# Patient Record
Sex: Female | Born: 1945
Health system: Southern US, Community
[De-identification: ages and names within clinical notes are randomized; demographics above are authoritative.]

## PROBLEM LIST (undated history)

## (undated) DIAGNOSIS — IMO0001 Reserved for inherently not codable concepts without codable children: Secondary | ICD-10-CM

## (undated) DIAGNOSIS — I10 Essential (primary) hypertension: Secondary | ICD-10-CM

## (undated) DIAGNOSIS — E039 Hypothyroidism, unspecified: Secondary | ICD-10-CM

## (undated) DIAGNOSIS — K279 Peptic ulcer, site unspecified, unspecified as acute or chronic, without hemorrhage or perforation: Secondary | ICD-10-CM

## (undated) DIAGNOSIS — Z794 Long term (current) use of insulin: Secondary | ICD-10-CM

## (undated) DIAGNOSIS — G8929 Other chronic pain: Secondary | ICD-10-CM

## (undated) DIAGNOSIS — M549 Dorsalgia, unspecified: Secondary | ICD-10-CM

## (undated) DIAGNOSIS — I509 Heart failure, unspecified: Secondary | ICD-10-CM

## (undated) DIAGNOSIS — E669 Obesity, unspecified: Secondary | ICD-10-CM

## (undated) DIAGNOSIS — F0390 Unspecified dementia without behavioral disturbance: Secondary | ICD-10-CM

## (undated) DIAGNOSIS — E119 Type 2 diabetes mellitus without complications: Secondary | ICD-10-CM

## (undated) DIAGNOSIS — K7581 Nonalcoholic steatohepatitis (NASH): Secondary | ICD-10-CM

## (undated) DIAGNOSIS — F03A0 Unspecified dementia, mild, without behavioral disturbance, psychotic disturbance, mood disturbance, and anxiety: Secondary | ICD-10-CM

## (undated) HISTORY — DX: Peptic ulcer, site unspecified, unspecified as acute or chronic, without hemorrhage or perforation: K27.9

## (undated) HISTORY — PX: INSERT / REPLACE / REMOVE PACEMAKER: SUR710

## (undated) HISTORY — PX: TOTAL ABDOMINAL HYSTERECTOMY W/ BILATERAL SALPINGOOPHORECTOMY: SHX83

## (undated) HISTORY — DX: Hypothyroidism, unspecified: E03.9

## (undated) HISTORY — DX: Long term (current) use of insulin: Z79.4

## (undated) HISTORY — DX: Essential (primary) hypertension: I10

## (undated) HISTORY — DX: Dorsalgia, unspecified: M54.9

## (undated) HISTORY — DX: Nonalcoholic steatohepatitis (NASH): K75.81

## (undated) HISTORY — DX: Type 2 diabetes mellitus without complications: E11.9

## (undated) HISTORY — DX: Other chronic pain: G89.29

## (undated) HISTORY — PX: CHOLECYSTECTOMY: SHX55

## (undated) HISTORY — DX: Reserved for inherently not codable concepts without codable children: IMO0001

## (undated) HISTORY — DX: Obesity, unspecified: E66.9

---

## 1989-01-12 DIAGNOSIS — K279 Peptic ulcer, site unspecified, unspecified as acute or chronic, without hemorrhage or perforation: Secondary | ICD-10-CM

## 1989-01-12 HISTORY — DX: Peptic ulcer, site unspecified, unspecified as acute or chronic, without hemorrhage or perforation: K27.9

## 2004-03-23 ENCOUNTER — Ambulatory Visit: Payer: Self-pay

## 2004-05-09 ENCOUNTER — Emergency Department: Payer: Self-pay | Admitting: Unknown Physician Specialty

## 2004-06-19 ENCOUNTER — Ambulatory Visit: Payer: Self-pay | Admitting: Urology

## 2006-06-08 ENCOUNTER — Inpatient Hospital Stay: Payer: Self-pay | Admitting: Internal Medicine

## 2006-06-08 ENCOUNTER — Other Ambulatory Visit: Payer: Self-pay

## 2006-06-16 ENCOUNTER — Emergency Department: Payer: Self-pay | Admitting: Emergency Medicine

## 2006-06-16 ENCOUNTER — Other Ambulatory Visit: Payer: Self-pay

## 2006-06-24 ENCOUNTER — Ambulatory Visit: Payer: Self-pay | Admitting: Internal Medicine

## 2007-01-31 ENCOUNTER — Emergency Department: Payer: Self-pay | Admitting: Emergency Medicine

## 2007-02-10 ENCOUNTER — Other Ambulatory Visit: Payer: Self-pay

## 2007-02-11 ENCOUNTER — Inpatient Hospital Stay: Payer: Self-pay | Admitting: Internal Medicine

## 2007-09-12 ENCOUNTER — Ambulatory Visit: Payer: Self-pay | Admitting: Internal Medicine

## 2007-11-04 ENCOUNTER — Ambulatory Visit: Payer: Self-pay | Admitting: Gastroenterology

## 2007-11-25 ENCOUNTER — Emergency Department: Payer: Self-pay | Admitting: Emergency Medicine

## 2008-01-08 ENCOUNTER — Ambulatory Visit: Payer: Self-pay | Admitting: Anesthesiology

## 2008-01-13 ENCOUNTER — Ambulatory Visit: Payer: Self-pay | Admitting: Internal Medicine

## 2008-01-18 ENCOUNTER — Emergency Department: Payer: Self-pay | Admitting: Emergency Medicine

## 2008-02-09 ENCOUNTER — Emergency Department (HOSPITAL_COMMUNITY): Admission: EM | Admit: 2008-02-09 | Discharge: 2008-02-09 | Payer: Self-pay | Admitting: Emergency Medicine

## 2008-02-27 ENCOUNTER — Emergency Department: Payer: Self-pay | Admitting: Emergency Medicine

## 2008-03-15 ENCOUNTER — Emergency Department: Payer: Self-pay | Admitting: Emergency Medicine

## 2008-03-22 ENCOUNTER — Ambulatory Visit: Payer: Self-pay | Admitting: Anesthesiology

## 2009-01-31 ENCOUNTER — Ambulatory Visit: Payer: Self-pay | Admitting: Family

## 2009-02-11 ENCOUNTER — Ambulatory Visit: Payer: Self-pay | Admitting: Family

## 2009-09-13 ENCOUNTER — Emergency Department: Payer: Self-pay | Admitting: Emergency Medicine

## 2010-06-17 ENCOUNTER — Emergency Department: Payer: Self-pay | Admitting: Emergency Medicine

## 2011-01-22 ENCOUNTER — Ambulatory Visit: Payer: Self-pay | Admitting: Surgery

## 2011-02-12 LAB — URINE CULTURE: Colony Count: >=100000

## 2011-02-12 LAB — POCT I-STAT, CHEM 8
BUN: 12
Calcium, Ion: 1.18
Creatinine, Ser: 1
Hemoglobin: 13.6
Sodium: 128 — ABNORMAL LOW
TCO2: 23

## 2011-02-12 LAB — URINE MICROSCOPIC-ADD ON

## 2011-02-12 LAB — URINALYSIS, ROUTINE W REFLEX MICROSCOPIC
Bilirubin Urine: NEGATIVE
Hgb urine dipstick: NEGATIVE
Ketones, ur: 15 — AB
Nitrite: NEGATIVE
Specific Gravity, Urine: 1.029
Urobilinogen, UA: 0.2

## 2011-02-12 LAB — GLUCOSE, CAPILLARY
Glucose-Capillary: 245 — ABNORMAL HIGH
Glucose-Capillary: 323 — ABNORMAL HIGH

## 2011-02-24 ENCOUNTER — Emergency Department: Payer: Self-pay | Admitting: Emergency Medicine

## 2011-03-01 ENCOUNTER — Other Ambulatory Visit: Payer: Self-pay | Admitting: Internal Medicine

## 2011-03-15 ENCOUNTER — Ambulatory Visit: Payer: Self-pay | Admitting: Surgery

## 2011-03-21 ENCOUNTER — Emergency Department: Payer: Self-pay | Admitting: *Deleted

## 2011-03-29 ENCOUNTER — Telehealth: Payer: Self-pay | Admitting: Internal Medicine
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
## 2011-03-30 NOTE — Telephone Encounter (Signed)
Patient says that she will try the miralax and will call back if there is no improvement.

## 2011-03-30 NOTE — Telephone Encounter (Signed)
Is she taking any laxatives, such as miralax? I would first recommend miralax 17gm by mouth twice daily.

## 2011-03-31 LAB — HEMOGLOBIN: Blood: 14.6

## 2011-03-31 LAB — BASIC METABOLIC PANEL
Creatinine: 1.2 mg/dL — AB (ref <=1.1)
Glucose: 382 mg/dL
Potassium: 5.5 mmol/L — AB (ref 3.4–5.3)

## 2011-03-31 LAB — HEPATIC FUNCTION PANEL
ALT: 58 U/L — AB (ref 7–35)
Bilirubin, Total: 1.1 mg/dL

## 2011-03-31 LAB — CORTISOL: Blood: 32.6

## 2011-04-02 ENCOUNTER — Ambulatory Visit: Payer: Self-pay | Admitting: Internal Medicine

## 2011-04-03 LAB — BASIC METABOLIC PANEL
Creatinine: 1.2 mg/dL — AB (ref <=1.1)
Glucose: 135 mg/dL

## 2011-04-03 LAB — TSH: TSH: 79.17 u[IU]/mL — AB (ref <=5.90)

## 2011-04-03 LAB — T4: Blood: 0.32

## 2011-04-11 ENCOUNTER — Ambulatory Visit (INDEPENDENT_AMBULATORY_CARE_PROVIDER_SITE_OTHER): Payer: Medicare Other | Admitting: Internal Medicine

## 2011-04-11 ENCOUNTER — Encounter: Payer: Self-pay | Admitting: Internal Medicine

## 2011-04-11 VITALS — BP 142/82 | HR 106 | Temp 98.1°F | Wt 176.0 lb

## 2011-04-11 DIAGNOSIS — E039 Hypothyroidism, unspecified: Secondary | ICD-10-CM

## 2011-04-11 DIAGNOSIS — R5383 Other fatigue: Secondary | ICD-10-CM

## 2011-04-11 DIAGNOSIS — R109 Unspecified abdominal pain: Secondary | ICD-10-CM

## 2011-04-11 DIAGNOSIS — E119 Type 2 diabetes mellitus without complications: Secondary | ICD-10-CM | POA: Insufficient documentation

## 2011-04-11 DIAGNOSIS — G8929 Other chronic pain: Secondary | ICD-10-CM

## 2011-04-11 DIAGNOSIS — R1032 Left lower quadrant pain: Secondary | ICD-10-CM | POA: Insufficient documentation

## 2011-04-11 DIAGNOSIS — R5381 Other malaise: Secondary | ICD-10-CM

## 2011-04-11 LAB — POCT URINALYSIS DIPSTICK
Bilirubin, UA: NEGATIVE
Blood, UA: NEGATIVE
Nitrite, UA: NEGATIVE
Urobilinogen, UA: 1
pH, UA: 6.5

## 2011-04-11 NOTE — Progress Notes (Signed)
Subjective:    Patient ID: Andrea [name], female    DOB: 1946/04/06, 65 y.o.   MRN: [id number]  HPI Andrea [name] is a 65 year old female with a history of diabetes, hypothyroidism, and hypertension who had been lost to followup for almost one year. She was recently admitted at [facility] when she was noted to be lethargic and had severe constipation. During that admission her TSH was checked and found to be greater than 100. Her husband notes that she had not refilled her Synthroid in approximately one year. She was started back on her medications and laxatives were given to help with her severe constipation. She was discharged from the hospital last week. Her husband reports that since she has been home she has been extremely fatigued. She has not been acting like herself. Her gait has been unstable. He has been monitoring her 24 hours a day. He has been giving her her medicines and note she has been compliant with her medications. Over the last couple of days, she has complained of progressively worsening left lower quadrant abdominal pain. She has not had any nausea, vomiting, fever, chills. She has not had any diarrhea or blood in her stool. Her constipation resolved and she reports her stools have been normal. She has not been taking any medication for her abdominal pain.  In regards to her diabetes, patient's husband reports that her blood sugars have been running near 200. He denies any low blood sugars or any blood sugars greater than 300. He reports that he has been giving patient her insulin injections.  Outpatient Encounter Prescriptions as of 04/11/2011  Medication Sig Dispense Refill  . acetaminophen (TYLENOL) 325 MG tablet Take 650 mg by mouth 3 (three) times daily as needed.        Marland Kitchen esomeprazole (NEXIUM) 40 MG capsule Take 40 mg by mouth daily.        Marland Kitchen gabapentin (NEURONTIN) 600 MG tablet Take 600 mg by mouth 2 (two) times daily.        . insulin NPH (HUMULIN N,NOVOLIN N) 100 UNIT/ML injection Inject  into the skin. 40 units in am and 30 in afternoon       . levothyroxine (SYNTHROID, LEVOTHROID) 137 MCG tablet Take 137 mcg by mouth daily.        Marland Kitchen lisinopril (PRINIVIL,ZESTRIL) 40 MG tablet Take 40 mg by mouth daily.        . metoprolol (LOPRESSOR) 50 MG tablet Take 50 mg by mouth 2 (two) times daily.        . polyethylene glycol powder (GLYCOLAX/MIRALAX) powder Take 17 g by mouth 3 (three) times daily as needed. For constipation       . senna (SENOKOT) 8.6 MG tablet Take 1 tablet by mouth daily.          Review of Systems  Constitutional: Positive for activity change and fatigue. Negative for fever and unexpected weight change.  HENT: Negative for congestion and trouble swallowing.   Respiratory: Negative for cough and shortness of breath.   Cardiovascular: Negative for chest pain and leg swelling.  Gastrointestinal: Positive for abdominal pain, constipation and abdominal distention. Negative for nausea, vomiting, diarrhea, blood in stool and anal bleeding.  Genitourinary: Negative for dysuria, frequency and flank pain.  Musculoskeletal: Positive for myalgias.  Neurological: Positive for weakness.  Psychiatric/Behavioral: Positive for behavioral problems, confusion and decreased concentration. The patient is not nervous/anxious.    BP 142/82  Pulse 106  Temp(Src) 98.1 F (36.7 C) (Oral)  Wt 176  lb (79.833 kg)  SpO2 90%     Objective:   Physical Exam  Constitutional: She is oriented to person, place, and time. She appears well-developed and well-nourished. No distress.  HENT:  Head: Normocephalic and atraumatic.  Right Ear: External ear normal.  Left Ear: External ear normal.  Nose: Nose normal.  Mouth/Throat: Oropharynx is clear and moist. No oropharyngeal exudate.  Eyes: Conjunctivae are normal. Pupils are equal, round, and reactive to light. Right eye exhibits no discharge. Left eye exhibits no discharge. No scleral icterus.  Neck: Normal range of motion. Neck supple. No  tracheal deviation present. No thyromegaly present.  Cardiovascular: Normal rate, regular rhythm, normal heart sounds and intact distal pulses.  Exam reveals no gallop and no friction rub.   No murmur heard. Pulmonary/Chest: Effort normal and breath sounds normal. No respiratory distress. She has no wheezes. She has no rales. She exhibits no tenderness.  Abdominal: Soft. Bowel sounds are normal. She exhibits no distension. There is tenderness (left lower quadrant).  Musculoskeletal: Normal range of motion. She exhibits no edema and no tenderness.  Lymphadenopathy:    She has no cervical adenopathy.  Neurological: She is alert and oriented to person, place, and time. No cranial nerve deficit. She exhibits normal muscle tone. Coordination normal.  Skin: Skin is warm and dry. No rash noted. She is not diaphoretic. No erythema. No pallor.  Psychiatric: Thought content normal. Her affect is inappropriate. Her speech is delayed and slurred. She is slowed and withdrawn. Cognition and memory are impaired. She expresses inappropriate judgment. She exhibits a depressed mood.          Assessment & Plan:  1.  Lethargy - symptoms on presentation at [facility] are most consistent with myxedema coma. Thyroid medication was restarted. We will recheck TSH with labs today. Question if she Burgner have taken some medication inappropriately contributing to her lethargy. Will check a urine drug screen today. Note that a urine drug screen at [facility] was negative. CT of the head was also negative. She will followup next week.  2. Hypothyroidism -as above, her symptoms are most consistent with myxedema coma. She has been restarted on her thyroid medication during admission at [facility], however I question whether this is adequate. If her TSH continues to be markedly elevated on labs today and her symptoms of lethargy continue, I would favor admission for IV Synthroid and close monitoring.  3. Left lower quadrant abdominal pain -her  constipation was treated during her recent admission however her abdominal pain has gradually worsened. Exam is remarkable for tenderness LLQ. Question if she Suski have diverticulitis. Will get CT of the abdomen and pelvis.  4. DM - husband has been monitoring patient's blood sugars carefully. Blood sugar slightly elevated near 200, however given current altered mental status would prefer to allow some hyperglycemia. We'll continue current dose of NPH insulin. She will followup in one week.

## 2011-04-11 NOTE — Patient Instructions (Signed)
Labs today. Follow up in 1 week.

## 2011-04-12 ENCOUNTER — Ambulatory Visit: Payer: Self-pay | Admitting: Internal Medicine

## 2011-04-12 LAB — CBC WITH DIFFERENTIAL/PLATELET
Basophils Absolute: 0.2 10*3/uL — ABNORMAL HIGH (ref 0.0–0.1)
Eosinophils Absolute: 0.3 10*3/uL (ref 0.0–0.7)
HCT: 38.2 % (ref 36.0–46.0)
Hemoglobin: 13.2 g/dL (ref 12.0–15.0)
Lymphs Abs: 3.3 10*3/uL (ref 0.7–4.0)
MCHC: 34.7 g/dL (ref 30.0–36.0)
MCV: 89.8 fl (ref 78.0–100.0)
Monocytes Absolute: 0.4 10*3/uL (ref 0.1–1.0)
Neutro Abs: 5 10*3/uL (ref 1.4–7.7)
Platelets: 227 10*3/uL (ref 150.0–400.0)
RDW: 12.5 % (ref 11.5–14.6)

## 2011-04-12 LAB — COMPREHENSIVE METABOLIC PANEL
ALT: 26 U/L (ref 0–35)
AST: 23 U/L (ref 0–37)
Alkaline Phosphatase: 163 U/L — ABNORMAL HIGH (ref 39–117)
CO2: 28 mEq/L (ref 19–32)
Creatinine, Ser: 1 mg/dL (ref 0.4–1.2)
GFR: 61.17 mL/min (ref >60.00)
Sodium: 134 mEq/L — ABNORMAL LOW (ref 135–145)
Total Bilirubin: 0.5 mg/dL (ref 0.3–1.2)

## 2011-04-13 ENCOUNTER — Telehealth: Payer: Self-pay | Admitting: Internal Medicine

## 2011-04-13 LAB — DRUG SCREEN, URINE
Barbiturate Quant, Ur: NEGATIVE
Cocaine Metabolites: NEGATIVE
Creatinine,U: 61.7 mg/dL
Opiates: NEGATIVE

## 2011-04-13 MED ORDER — METRONIDAZOLE 500 MG PO TABS: 500.0000 mg | ORAL_TABLET | Freq: Two times a day (BID) | ORAL | Status: AC

## 2011-04-13 MED ORDER — CIPROFLOXACIN HCL 500 MG PO TABS: 500.0000 mg | ORAL_TABLET | Freq: Two times a day (BID) | ORAL | Status: AC

## 2011-04-13 NOTE — Telephone Encounter (Signed)
Rx's sent in, Patient informed

## 2011-04-13 NOTE — Telephone Encounter (Signed)
CT scan showed diverticulitis. This is what is causing her abdominal pain.  Would recommend starting Cipro 500mg  po bid and Flagyl 500mg  po bid both for 14 days. She has follow up next week.

## 2011-04-16 ENCOUNTER — Telehealth: Payer: Self-pay | Admitting: Internal Medicine

## 2011-04-16 NOTE — Telephone Encounter (Signed)
[phone] Pt spouse called with blood sugar readings Friday    9:30am    494 12            465 4 pm        204 Saturday 12       136 6pm    400 10pm  460 Sunday 9am       360 3pm       27 8 10pm    378 Monday 11am    466  Pt was taking humulin 70/30 [facility] said not making humulin any longer they gave her  novloin 70/30 Pt upped her insuln to 50 units Saturday night Hospital took her off medformin  Spouse said pt is still not having bowel movement on her own. Last bowel movement Saturday.  Her memory is still not good.

## 2011-04-16 NOTE — Telephone Encounter (Signed)
Please bring her in this week

## 2011-04-16 NOTE — Telephone Encounter (Signed)
Spoke w/pt - confirmed that she will keep her f/u apt this thursday

## 2011-04-19 ENCOUNTER — Ambulatory Visit (INDEPENDENT_AMBULATORY_CARE_PROVIDER_SITE_OTHER): Payer: Medicare Other | Admitting: Internal Medicine

## 2011-04-19 ENCOUNTER — Encounter: Payer: Self-pay | Admitting: Internal Medicine

## 2011-04-19 VITALS — BP 136/80 | HR 58 | Temp 97.8°F | Wt 177.0 lb

## 2011-04-19 DIAGNOSIS — E039 Hypothyroidism, unspecified: Secondary | ICD-10-CM

## 2011-04-19 DIAGNOSIS — E119 Type 2 diabetes mellitus without complications: Secondary | ICD-10-CM

## 2011-04-19 DIAGNOSIS — K5732 Diverticulitis of large intestine without perforation or abscess without bleeding: Secondary | ICD-10-CM

## 2011-04-19 DIAGNOSIS — K5792 Diverticulitis of intestine, part unspecified, without perforation or abscess without bleeding: Secondary | ICD-10-CM

## 2011-04-19 MED ORDER — LEVOTHYROXINE SODIUM 137 MCG PO TABS: 137.0000 ug | ORAL_TABLET | Freq: Every day | ORAL | Status: DC

## 2011-04-19 MED ORDER — HYDROCODONE-ACETAMINOPHEN 5-325 MG PO TABS: 1.0000 | ORAL_TABLET | Freq: Four times a day (QID) | ORAL | Status: AC | PRN

## 2011-04-19 NOTE — Patient Instructions (Signed)
Diverticulitis A diverticulum is a small pouch or sac on the colon. Diverticulosis is the presence of these diverticula on the colon. Diverticulitis is the irritation (inflammation) or infection of diverticula. CAUSES  The colon and its diverticula contain bacteria. If food particles block the tiny opening to a diverticulum, the bacteria inside can grow and cause an increase in pressure. This leads to infection and inflammation and is called diverticulitis. SYMPTOMS   Abdominal pain and tenderness. Usually, the pain is located on the left side of your abdomen. However, it could be located elsewhere.   Fever.   Bloating.   Feeling sick to your stomach (nausea).   Throwing up (vomiting).   Abnormal stools.  DIAGNOSIS  Your caregiver will take a history and perform a physical exam. Since many things can cause abdominal pain, other tests Cupit be necessary. Tests Jons include:  Blood tests.   Urine tests.   X-ray of the abdomen.   CT scan of the abdomen.  Sometimes, surgery is needed to determine if diverticulitis or other conditions are causing your symptoms. TREATMENT  Most of the time, you can be treated without surgery. Treatment includes:  Resting the bowels by only having liquids for a few days. As you improve, you will need to eat a low-fiber diet.   Intravenous (IV) fluids if you are losing body fluids (dehydrated).   Antibiotic medicines that treat infections Wester be given.   Pain and nausea medicine, if needed.   Surgery if the inflamed diverticulum has burst.  HOME CARE INSTRUCTIONS   Try a clear liquid diet (broth, tea, or water for as long as directed by your caregiver). You Mesch then gradually begin a low-fiber diet as tolerated. A low-fiber diet is a diet with less than 10 grams of fiber. Choose the foods below to reduce fiber in the diet:   White breads, cereals, rice, and pasta.   Cooked fruits and vegetables or soft fresh fruits and vegetables without the skin.     Ground or well-cooked tender beef, ham, veal, lamb, pork, or poultry.   Eggs and seafood.   After your diverticulitis symptoms have improved, your caregiver Gayman put you on a high-fiber diet. A high-fiber diet includes 14 grams of fiber for every 1000 calories consumed. For a standard 2000 calorie diet, you would need 28 grams of fiber. Follow these diet guidelines to help you increase the fiber in your diet. It is important to slowly increase the amount fiber in your diet to avoid gas, constipation, and bloating.   Choose whole-grain breads, cereals, pasta, and brown rice.   Choose fresh fruits and vegetables with the skin on. Do not overcook vegetables because the more vegetables are cooked, the more fiber is lost.   Choose more nuts, seeds, legumes, dried peas, beans, and lentils.   Look for food products that have greater than 3 grams of fiber per serving on the Nutrition Facts label.   Take all medicine as directed by your caregiver.   If your caregiver has given you a follow-up appointment, it is very important that you go. Not going could result in lasting (chronic) or permanent injury, pain, and disability. If there is any problem keeping the appointment, call to reschedule.  SEEK MEDICAL CARE IF:   Your pain does not improve.   You have a hard time advancing your diet beyond clear liquids.   Your bowel movements do not return to normal.  SEEK IMMEDIATE MEDICAL CARE IF:   Your pain becomes   worse.   You have an oral temperature above 102 F (38.9 C), not controlled by medicine.   You have repeated vomiting.   You have bloody or black, tarry stools.   Symptoms that brought you to your caregiver become worse or are not getting better.  MAKE SURE YOU:   Understand these instructions.   Will watch your condition.   Will get help right away if you are not doing well or get worse.  Document Released: 02/07/2005 Document Revised: 01/10/2011 Document Reviewed:  06/05/2010 [facility] Patient Information 2012 [place].

## 2011-04-19 NOTE — Progress Notes (Signed)
Subjective:    Patient ID: Andrea [name], female    DOB: 1946/02/27, 66 y.o.   MRN: [id number]  HPI 65 year old female with a recent history of myxedema coma, diabetes, and diverticulitis presents for followup. She was recently hospitalized at [facility] with myxedema coma. She was started back on her thyroid medication after approximately one year without her medication. At her last visit, her TSH level was noted to have dropped to 37. This was marked improvement compared to previous levels greater than 100. Her husband reports that over the last week her mental status has improved markedly. He notes that she is more interactive in her weakness has improved. She does not recall events of the last few weeks.  In regards to her left lower quadrant pain, she underwent a CT of the abdomen after her last visit which showed diverticulitis. She was treated with Cipro and Flagyl. She still has one week left of antibiotic treatment. She has noted some improvement in her pain. She has been taking Tylenol for pain. She would like to use something stronger for severe episodes. She denies any diarrhea or blood in her stool. She denies any nausea or vomiting.  In regards to her diabetes, her husband reports that her blood sugars have been well-controlled, typically between 160 and 180. She increased her insulin to 50 units of NPH twice daily. She denies any low blood sugars.  Outpatient Encounter Prescriptions as of 04/19/2011  Medication Sig Dispense Refill  . acetaminophen (TYLENOL) 325 MG tablet Take 650 mg by mouth 3 (three) times daily as needed.        . ciprofloxacin (CIPRO) 500 MG tablet Take 1 tablet (500 mg total) by mouth 2 (two) times daily.  28 tablet  0  . esomeprazole (NEXIUM) 40 MG capsule Take 40 mg by mouth daily.        Marland Kitchen gabapentin (NEURONTIN) 600 MG tablet Take 600 mg by mouth 2 (two) times daily.        . insulin NPH (HUMULIN N,NOVOLIN N) 100 UNIT/ML injection Inject into the skin. 50  units in am and 50 in afternoon      . levothyroxine (SYNTHROID, LEVOTHROID) 137 MCG tablet Take 1 tablet (137 mcg total) by mouth daily.  30 tablet  6  . lisinopril (PRINIVIL,ZESTRIL) 40 MG tablet Take 40 mg by mouth daily.        . metoprolol (LOPRESSOR) 50 MG tablet Take 50 mg by mouth 2 (two) times daily.        . metroNIDAZOLE (FLAGYL) 500 MG tablet Take 1 tablet (500 mg total) by mouth 2 (two) times daily.  28 tablet  0  . polyethylene glycol powder (GLYCOLAX/MIRALAX) powder Take 17 g by mouth 3 (three) times daily as needed. For constipation       . senna (SENOKOT) 8.6 MG tablet Take 1 tablet by mouth daily.        Marland Kitchen HYDROcodone-acetaminophen (NORCO) 5-325 MG per tablet Take 1 tablet by mouth every 6 (six) hours as needed for pain.  60 tablet  0    Review of Systems  Constitutional: Positive for fatigue. Negative for fever, chills, appetite change and unexpected weight change.  HENT: Negative for ear pain, congestion, sore throat, trouble swallowing, neck pain, voice change and sinus pressure.   Eyes: Negative for visual disturbance.  Respiratory: Negative for cough, shortness of breath, wheezing and stridor.   Cardiovascular: Negative for chest pain, palpitations and leg swelling.  Gastrointestinal: Positive for abdominal pain.  Negative for nausea, vomiting, diarrhea, constipation, blood in stool, abdominal distention and anal bleeding.  Genitourinary: Negative for dysuria and flank pain.  Musculoskeletal: Positive for back pain. Negative for myalgias, arthralgias and gait problem.  Skin: Negative for color change and rash.  Neurological: Positive for weakness. Negative for dizziness and headaches.  Hematological: Negative for adenopathy. Does not bruise/bleed easily.  Psychiatric/Behavioral: Positive for behavioral problems and decreased concentration. Negative for suicidal ideas, sleep disturbance and dysphoric mood. The patient is not nervous/anxious.    BP 136/80  Pulse 58   Temp(Src) 97.8 F (36.6 C) (Oral)  Wt 177 lb (80.287 kg)  SpO2 98%     Objective:   Physical Exam  Constitutional: She is oriented to person, place, and time. She appears well-developed and well-nourished. No distress.  HENT:  Head: Normocephalic and atraumatic.  Right Ear: External ear normal.  Left Ear: External ear normal.  Nose: Nose normal.  Mouth/Throat: Oropharynx is clear and moist. No oropharyngeal exudate.  Eyes: Conjunctivae are normal. Pupils are equal, round, and reactive to light. Right eye exhibits no discharge. Left eye exhibits no discharge. No scleral icterus.  Neck: Normal range of motion. Neck supple. No tracheal deviation present. No thyromegaly present.  Cardiovascular: Normal rate, regular rhythm, normal heart sounds and intact distal pulses.  Exam reveals no gallop and no friction rub.   No murmur heard. Pulmonary/Chest: Effort normal and breath sounds normal. No respiratory distress. She has no wheezes. She has no rales. She exhibits no tenderness.  Abdominal: Soft. Bowel sounds are normal. She exhibits no distension and no mass. There is tenderness (minimal left lower quadrant). There is no rebound and no guarding.  Musculoskeletal: Normal range of motion. She exhibits no edema and no tenderness.  Lymphadenopathy:    She has no cervical adenopathy.  Neurological: She is alert and oriented to person, place, and time. No cranial nerve deficit. She exhibits normal muscle tone. Coordination normal.  Skin: Skin is warm and dry. No rash noted. She is not diaphoretic. No erythema. No pallor.  Psychiatric: She has a normal mood and affect. Her behavior is normal. Judgment and thought content normal.          Assessment & Plan:  1. Hypothyroidism -symptomatically, marked improvement. We'll plan to repeat TSH after next visit in one month. Will continue Synthroid at current dose.  2. Diverticulitis - continues to have some intermittent pain. Will add hydrocodone  for episodes of severe pain. Will continue Cipro and Flagyl. We'll set up GI evaluation for colonoscopy.  3. Diabetes mellitus - blood sugars are improved. Will continue current doses of Novolin 70/30. Patient will followup in one month.

## 2011-04-24 ENCOUNTER — Telehealth: Payer: Self-pay | Admitting: *Deleted

## 2011-04-24 NOTE — Telephone Encounter (Signed)
OK, will write out specific directions at her visit.

## 2011-04-24 NOTE — Telephone Encounter (Signed)
Home health RN called and left VM - patient left hospital w/sliding scale orders for her insulin. RN wants directions from Dr walker for what she would like patient to follow. Pt's fasting cbg this am was 434 and pt reports readings have been elevated. [facility] is having a dietician to see pt soon. I called RN, Arbie Cookey and advised her that patient has f/u OV with Dr Gilford Rile this Thursday, medications will be discussed at that time.

## 2011-04-26 ENCOUNTER — Ambulatory Visit: Payer: Self-pay | Admitting: Gastroenterology

## 2011-04-27 ENCOUNTER — Telehealth: Payer: Self-pay | Admitting: Internal Medicine

## 2011-04-27 NOTE — Telephone Encounter (Signed)
Spoke w/pt's husband - pt is in significant abd discomfort, c/o weakness and she also fell this am backwards hitting the back of her head. Dr Gilford Rile informed and agreed that patient needed to go to the ER for eval. Husband informed and agreed.

## 2011-04-27 NOTE — Telephone Encounter (Signed)
Patient has fallen and hit her head today while her husband was on the phone. Her husband wanted you to know.

## 2011-04-27 NOTE — Telephone Encounter (Signed)
Can you please get more information about this

## 2011-05-01 ENCOUNTER — Encounter: Payer: Self-pay | Admitting: Internal Medicine

## 2011-05-01 ENCOUNTER — Telehealth: Payer: Self-pay | Admitting: *Deleted

## 2011-05-01 NOTE — Telephone Encounter (Signed)
I spoke w/Resident, Dr Rayburn Go at the ER at [facility]. Pt was seen by home health RN today who convinced patient to go to the ER. Main complaint was constipation, elevated BP and elevated cbgs. MD said BP and blood sugar are in normal range. They are giving her a bowel prep to help ease her abd pain due to constipation. Pt reported reported that she had not been taking her insulin or meds correctly due to her husband being in the hospital. Pt also expressed concerns over losing her home health care. I advised the MD that we would see her for hospital f/u as they directed and would help to continue her home health services if needed.

## 2011-05-02 ENCOUNTER — Telehealth: Payer: Self-pay | Admitting: *Deleted

## 2011-05-02 NOTE — Telephone Encounter (Signed)
Home health nurse called -   1.pt's husband is in the hospital but patient has family in town. RN was told by pt that she has colonoscopy soon, possibly dec 31st but does not have any details of apt. RN would like a call from our office w/info. I left VM w/RN for her to call GI MD to confirm colon apt.   2. RN is requesting an order for standard wheelchair w/Dx & length of need (1 year). FAX to [facility] [phone]. Order completed, to be faxed tomorrow.

## 2011-05-02 NOTE — Telephone Encounter (Signed)
OK on wheelchair

## 2011-05-14 ENCOUNTER — Ambulatory Visit: Payer: Self-pay | Admitting: Gastroenterology

## 2011-05-16 ENCOUNTER — Other Ambulatory Visit: Payer: Self-pay | Admitting: Internal Medicine

## 2011-05-16 ENCOUNTER — Ambulatory Visit: Payer: Self-pay | Admitting: Gastroenterology

## 2011-05-16 NOTE — Telephone Encounter (Signed)
Patient is needing a refill on her Hydrocodone 5-325 mg. She is pacing the floor all night in pain.

## 2011-05-16 NOTE — Telephone Encounter (Signed)
Patient needs refill, but med is not on her med list. Left message asking patient to return my call to let me know directions.

## 2011-05-17 LAB — PATHOLOGY REPORT

## 2011-05-18 NOTE — Telephone Encounter (Signed)
I spoke w/pt- She had bowel prep Tuesday and colonoscopy wed. She was told that a couple polyps were removed and bowel prep was successful. Pt says she thinks she is constipated and has tried miralax, citracel and sennakot w/no BM. She was seen in the ER on 12/18 for severe constipation and abd pain which has been chronic problem lately. I advised her that if she had colonoscopy wed it would be unusual to become constipated this quickly. Also advised her that hydrocodone would cause constipation and she should limit use. She will go to the ER if pain does not get better or gets worse, pt agreed and has f/u w/Dr Gilford Rile this Wednesday.

## 2011-05-18 NOTE — Telephone Encounter (Signed)
This patient was getting vicodin in the past for diverticulitis episodes.  I will not refill without more info sinc eshe [place] need urgent evaluation.  thanks

## 2011-05-19 ENCOUNTER — Emergency Department (HOSPITAL_COMMUNITY)
Admission: EM | Admit: 2011-05-19 | Discharge: 2011-05-19 | Disposition: A | Discharge status: Home / Self Care | Payer: Medicare Other | Attending: Emergency Medicine | Admitting: Emergency Medicine

## 2011-05-19 ENCOUNTER — Encounter (HOSPITAL_COMMUNITY): Payer: Self-pay | Admitting: *Deleted

## 2011-05-19 ENCOUNTER — Emergency Department (HOSPITAL_COMMUNITY): Payer: Medicare Other

## 2011-05-19 ENCOUNTER — Other Ambulatory Visit: Payer: Self-pay

## 2011-05-19 DIAGNOSIS — I10 Essential (primary) hypertension: Secondary | ICD-10-CM | POA: Insufficient documentation

## 2011-05-19 DIAGNOSIS — R1032 Left lower quadrant pain: Secondary | ICD-10-CM

## 2011-05-19 DIAGNOSIS — E119 Type 2 diabetes mellitus without complications: Secondary | ICD-10-CM | POA: Insufficient documentation

## 2011-05-19 DIAGNOSIS — R110 Nausea: Secondary | ICD-10-CM | POA: Insufficient documentation

## 2011-05-19 DIAGNOSIS — R109 Unspecified abdominal pain: Secondary | ICD-10-CM | POA: Insufficient documentation

## 2011-05-19 DIAGNOSIS — Z794 Long term (current) use of insulin: Secondary | ICD-10-CM | POA: Insufficient documentation

## 2011-05-19 DIAGNOSIS — E669 Obesity, unspecified: Secondary | ICD-10-CM | POA: Insufficient documentation

## 2011-05-19 DIAGNOSIS — K279 Peptic ulcer, site unspecified, unspecified as acute or chronic, without hemorrhage or perforation: Secondary | ICD-10-CM | POA: Insufficient documentation

## 2011-05-19 LAB — CBC
MCV: 82.9 fL (ref 78.0–100.0)
Platelets: 176 10*3/uL (ref 150–400)
RDW: 12.3 % (ref 11.5–15.5)
WBC: 6.2 10*3/uL (ref 4.0–10.5)

## 2011-05-19 LAB — COMPREHENSIVE METABOLIC PANEL
AST: 18 U/L (ref 0–37)
Albumin: 3.7 g/dL (ref 3.5–5.2)
Calcium: 9.8 mg/dL (ref 8.4–10.5)
Chloride: 97 mEq/L (ref 96–112)
Creatinine, Ser: 0.64 mg/dL (ref 0.50–1.10)
Total Bilirubin: 0.3 mg/dL (ref 0.3–1.2)

## 2011-05-19 LAB — URINALYSIS, ROUTINE W REFLEX MICROSCOPIC
Glucose, UA: 250 mg/dL — AB
Hgb urine dipstick: NEGATIVE
Specific Gravity, Urine: 1.008 (ref 1.005–1.030)
pH: 7.5 (ref 5.0–8.0)

## 2011-05-19 LAB — LIPASE, BLOOD: Lipase: 36 U/L (ref 11–59)

## 2011-05-19 LAB — DIFFERENTIAL
Eosinophils Absolute: 0.1 10*3/uL (ref 0.0–0.7)
Eosinophils Relative: 1 % (ref 0–5)
Lymphs Abs: 1.5 10*3/uL (ref 0.7–4.0)

## 2011-05-19 MED ORDER — ONDANSETRON HCL 4 MG/2ML IJ SOLN: 4.0000 mg | Freq: Once | INTRAMUSCULAR | Status: DC

## 2011-05-19 MED ORDER — SODIUM CHLORIDE 0.9 % IV BOLUS (SEPSIS)
1000.0000 mL | Freq: Once | INTRAVENOUS | Status: AC
  Administered 2011-05-19: 1000 mL via INTRAVENOUS

## 2011-05-19 MED ORDER — OXYCODONE-ACETAMINOPHEN 5-325 MG PO TABS: 1.0000 | ORAL_TABLET | ORAL | Status: AC | PRN

## 2011-05-19 MED ORDER — HYDROMORPHONE HCL PF 1 MG/ML IJ SOLN
1.0000 mg | Freq: Once | INTRAMUSCULAR | Status: AC
  Administered 2011-05-19: 1 mg via INTRAVENOUS

## 2011-05-19 MED ORDER — HYDROMORPHONE HCL PF 1 MG/ML IJ SOLN
INTRAMUSCULAR | Status: AC
  Filled 2011-05-19: qty 1

## 2011-05-19 MED ORDER — IOHEXOL 300 MG/ML  SOLN
20.0000 mL | Freq: Once | INTRAMUSCULAR | Status: AC | PRN
  Administered 2011-05-19: 20 mL via ORAL

## 2011-05-19 MED ORDER — IOHEXOL 300 MG/ML  SOLN
100.0000 mL | Freq: Once | INTRAMUSCULAR | Status: AC | PRN
  Administered 2011-05-19: 100 mL via INTRAVENOUS

## 2011-05-19 MED ORDER — HYDROMORPHONE HCL PF 1 MG/ML IJ SOLN
1.0000 mg | Freq: Once | INTRAMUSCULAR | Status: AC
  Administered 2011-05-19: 1 mg via INTRAVENOUS
  Filled 2011-05-19: qty 1

## 2011-05-19 MED ORDER — HYDROMORPHONE HCL PF 1 MG/ML IJ SOLN
1.0000 mg | INTRAMUSCULAR | Status: AC
  Administered 2011-05-19: 1 mg via INTRAVENOUS
  Filled 2011-05-19: qty 1

## 2011-05-19 MED ORDER — HYDROMORPHONE HCL PF 1 MG/ML IJ SOLN: 1.0000 mg | Freq: Once | INTRAMUSCULAR | Status: DC

## 2011-05-19 MED ORDER — ONDANSETRON HCL 4 MG/2ML IJ SOLN
4.0000 mg | Freq: Once | INTRAMUSCULAR | Status: AC
  Administered 2011-05-19: 4 mg via INTRAVENOUS
  Filled 2011-05-19: qty 2

## 2011-05-19 NOTE — ED Notes (Signed)
Reports having LLQ pain for extended amount of time, having nausea, denies vomiting or diarrhea, last bm was 2 days ago.

## 2011-05-19 NOTE — ED Notes (Signed)
Pt finished drinking oral CT contrast.

## 2011-05-19 NOTE — ED Provider Notes (Signed)
History     CSN: [id number]  Arrival date & time 05/19/11  [id number]   First MD Initiated Contact with Patient 05/19/11 [phone]      Chief Complaint  Patient presents with  . Abdominal Pain    (Consider location/radiation/quality/duration/timing/severity/associated sxs/prior treatment) HPI  Patient complaining of left-sided abdominal pain for 6-8 weeks. She states pain has been constant in nature except yesterday when it was gone for sure. Time. She's been seen by her primary care doctor, Dr. Gilford Rile, in [place]. She states that she was referred to a gastroenterologist but does not know the name of the gastroenterologist. She states that she did have a CT scan sometime during this period but does not know exactly when it was. She states she was told at that time that she had diverticulitis and was put on antibiotics. She was called by the gastroenterologist and had a colonoscopy done on Wednesday. She states that she was scheduled to have it on Monday but had on Wednesday because she was told that she still had a lot of stool in her GI tract. She states at that time she was told she had diverticulosis and had multiple small polyps. She is continued to have pain since that time. She has nausea but no vomiting. She has not any fever or chills. She states she has lost 20-30 pounds subjectively. She denies any urinary tract infection symptoms. She has had a hysterectomy. She denies any chest pain cough or shortness of breath.  Past Medical History  Diagnosis Date  . HTN (hypertension)   . IDDM (insulin dependent diabetes mellitus)   . Chronic back pain     Followed by pain clinic in [place]  . NASH (nonalcoholic steatohepatitis)     has had liver biopsy in past  . Obesity   . Hypothyroidism   . Endometriosis   . PUD (peptic ulcer disease) 29's    Past Surgical History  Procedure Date  . Total abdominal hysterectomy w/ bilateral salpingoophorectomy   . Cholecystectomy     Family  History  Problem Relation Age of Onset  . COPD Sister   . Cancer Brother     History  Substance Use Topics  . Smoking status: Former Research scientist (life sciences)  . Smokeless tobacco: Not on file   Comment: Quit 1985  . Alcohol Use: No    OB History    Grav Para Term Preterm Abortions TAB SAB Ect Mult Living                  Review of Systems  All other systems reviewed and are negative.    Allergies  Watermelon concentrate; Penicillins; and Sulfa antibiotics  Home Medications   Current Outpatient Rx  Name Route Sig Dispense Refill  . VITAMIN D HIGH POTENCY PO Oral Take 1 capsule by mouth daily.      Marland Kitchen DICYCLOMINE HCL 10 MG PO CAPS Oral Take 10 mg by mouth 4 (four) times daily as needed. As needed for abdominal pain     . GABAPENTIN 600 MG PO TABS Oral Take 600 mg by mouth 2 (two) times daily.     . INSULIN ASPART 100 UNIT/ML [place] SOLN Subcutaneous Inject 3-15 Units into the skin 3 (three) times daily before meals. Sliding scale     . INSULIN ISOPHANE HUMAN 100 UNIT/ML  SUSP Subcutaneous Inject into the skin. 50 units in am and 50 in afternoon    . LEVOTHYROXINE SODIUM 137 MCG PO TABS Oral Take 1 tablet (137 mcg  total) by mouth daily. 30 tablet 6  . METOPROLOL TARTRATE 50 MG PO TABS Oral Take 50 mg by mouth 2 (two) times daily.      Marland Kitchen OMEPRAZOLE 40 MG PO CPDR Oral Take 40 mg by mouth daily.      . OXYCODONE HCL 5 MG PO TABS Oral Take 5-10 mg by mouth every 6 (six) hours as needed. pain     . PAROXETINE HCL 20 MG PO TABS Oral Take 20 mg by mouth every morning.      Marland Kitchen POLYETHYLENE GLYCOL 3350 PO POWD Oral Take 17 g by mouth 3 (three) times daily as needed. For constipation     . SENNOSIDES 8.6 MG PO TABS Oral Take 1 tablet by mouth daily.      . ACETAMINOPHEN 325 MG PO TABS Oral Take 650 mg by mouth 3 (three) times daily as needed.        BP 193/101  Pulse 87  Temp(Src) 98.6 F (37 C) (Oral)  Resp 16  SpO2 100%  Physical Exam  Nursing note and vitals reviewed. Constitutional: She is  oriented to person, place, and time. She appears well-developed.       Morbidly obese  HENT:  Head: Normocephalic and atraumatic.  Right Ear: External ear normal.  Left Ear: External ear normal.  Nose: Nose normal.  Mouth/Throat: Oropharynx is clear and moist.  Eyes: Conjunctivae and EOM are normal. Pupils are equal, round, and reactive to light.  Neck: Normal range of motion. Neck supple.  Cardiovascular: Normal rate, regular rhythm and normal heart sounds.   Abdominal: Soft. Bowel sounds are normal.       Left middle abdominal and upper quadrant pain noted with deep palpation.  Musculoskeletal: Normal range of motion.  Neurological: She is alert and oriented to person, place, and time. She has normal reflexes.  Skin: Skin is warm.  Psychiatric: She has a normal mood and affect.    ED Course  Procedures (including critical care time)   Labs Reviewed  CBC  DIFFERENTIAL  COMPREHENSIVE METABOLIC PANEL  LIPASE, BLOOD  URINALYSIS, ROUTINE W REFLEX MICROSCOPIC   No results found.   No diagnosis found.    MDM  Patient had initial pain relief after 1 mg of Dilaudid however pain returned and patient received a additional milligram to light without relief. She was initially hypertensive but currently her systolic blood pressure has decreased to 1:30. Results for orders placed during the hospital encounter of 05/19/11  DIFFERENTIAL      Component Value Range   Neutrophils Relative 71  43 - 77 (%)   Neutro Abs 4.7  1.7 - 7.7 (K/uL)   Lymphocytes Relative 23  12 - 46 (%)   Lymphs Abs 1.5  0.7 - 4.0 (K/uL)   Monocytes Relative 5  3 - 12 (%)   Monocytes Absolute 0.3  0.1 - 1.0 (K/uL)   Eosinophils Relative 1  0 - 5 (%)   Eosinophils Absolute 0.1  0.0 - 0.7 (K/uL)   Basophils Relative 1  0 - 1 (%)   Basophils Absolute 0.0  0.0 - 0.1 (K/uL)  CBC      Component Value Range   WBC 6.2  4.0 - 10.5 (K/uL)   RBC 5.08  3.87 - 5.11 (MIL/uL)   Hemoglobin 15.3 (*) 12.0 - 15.0 (g/dL)    HCT 42.1  36.0 - 46.0 (%)   MCV 82.9  78.0 - 100.0 (fL)   MCH 30.1  26.0 - 34.0 (pg)  MCHC 36.3 (*) 30.0 - 36.0 (g/dL)   RDW 12.3  11.5 - 15.5 (%)   Platelets 176  150 - 400 (K/uL)  COMPREHENSIVE METABOLIC PANEL      Component Value Range   Sodium 134 (*) 135 - 145 (mEq/L)   Potassium 3.9  3.5 - 5.1 (mEq/L)   Chloride 97  96 - 112 (mEq/L)   CO2 24  19 - 32 (mEq/L)   Glucose, Bld 287 (*) 70 - 99 (mg/dL)   BUN 10  6 - 23 (mg/dL)   Creatinine, Ser 0.64  0.50 - 1.10 (mg/dL)   Calcium 9.8  8.4 - 10.5 (mg/dL)   Total Protein 7.5  6.0 - 8.3 (g/dL)   Albumin 3.7  3.5 - 5.2 (g/dL)   AST 18  0 - 37 (U/L)   ALT 18  0 - 35 (U/L)   Alkaline Phosphatase 110  39 - 117 (U/L)   Total Bilirubin 0.3  0.3 - 1.2 (mg/dL)   GFR calc non Af Amer >90  >90 (mL/min)   GFR calc Af Amer >90  >90 (mL/min)  URINALYSIS, ROUTINE W REFLEX MICROSCOPIC      Component Value Range   Color, Urine YELLOW  YELLOW    APPearance HAZY (*) CLEAR    Specific Gravity, Urine 1.008  1.005 - 1.030    pH 7.5  5.0 - 8.0    Glucose, UA 250 (*) NEGATIVE (mg/dL)   Hgb urine dipstick NEGATIVE  NEGATIVE    Bilirubin Urine NEGATIVE  NEGATIVE    Ketones, ur NEGATIVE  NEGATIVE (mg/dL)   Protein, ur NEGATIVE  NEGATIVE (mg/dL)   Urobilinogen, UA 0.2  0.0 - 1.0 (mg/dL)   Nitrite NEGATIVE  NEGATIVE    Leukocytes, UA NEGATIVE  NEGATIVE   LIPASE, BLOOD      Component Value Range   Lipase 36  11 - 59 (U/L)    The patient is now pain-free and her abdomen is nontender. Her blood pressure has come down from 123456 2 systolic blood pressure of 120. She has a CT that does not show any acute abnormalities, comprehensive metabolic panel is normal with the exception of hyperglycemia, CBC is normal, and urine is clear. Lipase is checked and is normal. Patient's pain is currently controlled.  Discussed with patient her recent workup with gastroenterologist and primary care Dr. She does have the same pain for the past 2 months. She is receiving a  thorough outpatient evaluation. She has normal labs and vital signs at the exception of an elevated blood pressure were normal. This has now normalized after pain medicine. She will be given a prescription for 15 Percocet and is instructed to followup with her primary care Dr.      Shaune Pollack, MD 05/20/11 [id number]

## 2011-05-20 NOTE — Telephone Encounter (Signed)
I agree, I will defer the vicodin refill to Dr. Gilford Rile given the history Sarah obtained.

## 2011-05-23 ENCOUNTER — Ambulatory Visit: Payer: Medicare Other | Admitting: Internal Medicine

## 2011-05-24 ENCOUNTER — Telehealth: Payer: Self-pay | Admitting: *Deleted

## 2011-05-24 MED ORDER — INSULIN NPH ISOPHANE & REGULAR (70-30) 100 UNIT/ML ~~LOC~~ SUSP: SUBCUTANEOUS | Status: DC

## 2011-05-24 NOTE — Telephone Encounter (Signed)
I spoke w/pt's son and home health RN. Pt is on novolin 70/30 40 AM and 30 PM. Needs RF w/full mth supply sent to pharm. OK? This is not on med list but RN confirmed that pt was d/c'd from hospital on med.

## 2011-05-24 NOTE — Telephone Encounter (Signed)
OK for refill.

## 2011-05-24 NOTE — Telephone Encounter (Signed)
Pt to be seen next week? Correct?

## 2011-05-24 NOTE — Telephone Encounter (Signed)
Done

## 2011-05-27 ENCOUNTER — Emergency Department: Payer: Self-pay | Admitting: Emergency Medicine

## 2011-05-27 LAB — COMPREHENSIVE METABOLIC PANEL
Albumin: 3.8 g/dL (ref 3.4–5.0)
Anion Gap: 11 (ref 7–16)
BUN: 15 mg/dL (ref 7–18)
Calcium, Total: 9.8 mg/dL (ref 8.5–10.1)
Chloride: 97 mmol/L — ABNORMAL LOW (ref 98–107)
Co2: 24 mmol/L (ref 21–32)
Creatinine: 0.84 mg/dL (ref 0.60–1.30)
Glucose: 274 mg/dL — ABNORMAL HIGH (ref 65–99)
SGOT(AST): 35 U/L (ref 15–37)
SGPT (ALT): 32 U/L

## 2011-05-27 LAB — CBC
HCT: 38.7 % (ref 35.0–47.0)
HGB: 13.4 g/dL (ref 12.0–16.0)
MCH: 30.3 pg (ref 26.0–34.0)
MCV: 88 fL (ref 80–100)
WBC: 6.9 10*3/uL (ref 3.6–11.0)

## 2011-05-27 LAB — LIPASE, BLOOD: Lipase: 138 U/L (ref 73–393)

## 2011-05-28 ENCOUNTER — Telehealth: Payer: Self-pay | Admitting: *Deleted

## 2011-05-28 NOTE — Telephone Encounter (Signed)
I spoke w/husband. Explained to take pt back to the ER if she was in severe pain not relieved by current pain medications. He understood to keep apt Wed w/GI and will call w/any further concerns.

## 2011-05-28 NOTE — Telephone Encounter (Signed)
I spoke w/pt and son. Pt has been in the ER multiple times due to persistent abd pain. Son is very upset that she has been seen by the ER multiple times and not admitted. I explained that the ER MD's were the ones who determined if a patient needed to be admitted. I called and spoke w/Dr Skulskie's nurse who is sending records over. Will call son and inform them of apt she has Jan 16th (Wed) at 11:00 am.

## 2011-05-31 ENCOUNTER — Ambulatory Visit: Payer: Medicare Other | Admitting: Internal Medicine

## 2011-06-04 ENCOUNTER — Encounter: Payer: Self-pay | Admitting: Internal Medicine

## 2011-06-04 ENCOUNTER — Ambulatory Visit (INDEPENDENT_AMBULATORY_CARE_PROVIDER_SITE_OTHER): Payer: Medicare Other | Admitting: Internal Medicine

## 2011-06-04 ENCOUNTER — Telehealth: Payer: Self-pay | Admitting: Internal Medicine

## 2011-06-04 ENCOUNTER — Ambulatory Visit (INDEPENDENT_AMBULATORY_CARE_PROVIDER_SITE_OTHER)
Admission: RE | Admit: 2011-06-04 | Discharge: 2011-06-04 | Disposition: A | Discharge status: Home / Self Care | Payer: Medicare Other | Source: Ambulatory Visit | Attending: Internal Medicine | Admitting: Internal Medicine

## 2011-06-04 DIAGNOSIS — K5900 Constipation, unspecified: Secondary | ICD-10-CM

## 2011-06-04 DIAGNOSIS — I701 Atherosclerosis of renal artery: Secondary | ICD-10-CM

## 2011-06-04 DIAGNOSIS — E039 Hypothyroidism, unspecified: Secondary | ICD-10-CM

## 2011-06-04 DIAGNOSIS — R1032 Left lower quadrant pain: Secondary | ICD-10-CM

## 2011-06-04 DIAGNOSIS — R079 Chest pain, unspecified: Secondary | ICD-10-CM

## 2011-06-04 DIAGNOSIS — E119 Type 2 diabetes mellitus without complications: Secondary | ICD-10-CM

## 2011-06-04 DIAGNOSIS — D649 Anemia, unspecified: Secondary | ICD-10-CM

## 2011-06-04 DIAGNOSIS — K5909 Other constipation: Secondary | ICD-10-CM | POA: Insufficient documentation

## 2011-06-04 DIAGNOSIS — Z79899 Other long term (current) drug therapy: Secondary | ICD-10-CM

## 2011-06-04 DIAGNOSIS — G8929 Other chronic pain: Secondary | ICD-10-CM

## 2011-06-04 MED ORDER — OXYCODONE-ACETAMINOPHEN 5-325 MG PO TABS: 1.0000 | ORAL_TABLET | ORAL | Status: DC | PRN

## 2011-06-04 NOTE — Progress Notes (Addendum)
Subjective:    Patient ID: Andrea [name], female    DOB: June 08, 1945, 66 y.o.   MRN: [id number]  HPI 66 year old female with a history of diabetes, hypothyroidism, and chronic left sided abdominal pain presents for followup. Her family comes with her to this visit. They're very concerned about her chronic left-sided abdominal pain. She was recently seen by GI physician and underwent CT of the abdomen which was normal except for noted renal artery stenosis on the left. She reports that she has scheduled evaluation with vascular surgery this week. She notes a history of chronic constipation. She was started on Metamucil and senna by her GI physician. She reports no improvement on this combination. She notes that her last bowel movement was approximately one week ago. She describes her stool as hard and painful to pass.  In regards to her diabetes, her husband reports improved control of her blood sugars recently. He notes no blood sugars greater than 250. He denies any blood sugars less than 80. He reports full compliance with her insulin regimen.  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 Her TSH has been gradually improving with replacement of Synthroid. She continues to have some fatigue, hair loss, constipation, and weakness.  She is concerned today after having a recent fall at home yesterday. She reports that she lost her balance and fell on her right side onto a wooden table. She reports significant pain over her right lateral chest wall. She denies any shortness of breath or noted bruising. She did not lose consciousness during the fall.  Outpatient Encounter Prescriptions as of 06/04/2011  Medication Sig Dispense Refill  . acetaminophen (TYLENOL) 325 MG tablet Take 650 mg by mouth 3 (three) times daily as needed.        . Cholecalciferol (VITAMIN D HIGH POTENCY PO) Take 1 capsule by mouth daily.         Marland Kitchen dicyclomine (BENTYL) 10 MG capsule Take 10 mg by mouth 4 (four) times daily as needed. As needed for abdominal pain       . gabapentin (NEURONTIN) 600 MG tablet Take 600 mg by mouth 2 (two) times daily.       . insulin aspart (NOVOLOG) 100 UNIT/ML injection Inject 3-15 Units into the skin 3 (three) times daily before meals. Sliding scale       . insulin NPH (HUMULIN N,NOVOLIN N) 100 UNIT/ML injection Inject into the skin. 50 units in am and 50 in afternoon      . insulin NPH-insulin regular (NOVOLIN 70/30) (70-30) 100 UNIT/ML injection 40 units in AM and 30 units in PM  30 mL  3  . levothyroxine (SYNTHROID, LEVOTHROID) 137 MCG tablet Take 1 tablet (137 mcg total) by mouth daily.  30 tablet  6  . metoprolol (LOPRESSOR) 50 MG tablet Take 50 mg by mouth 2 (two) times daily.        Marland Kitchen omeprazole (PRILOSEC) 40 MG capsule Take 40 mg by mouth daily.        Marland Kitchen oxyCODONE-acetaminophen (PERCOCET) 5-325 MG per tablet Take 1 tablet by mouth every 4 (four) hours as needed.  90 tablet  0  . PARoxetine (PAXIL) 20 MG tablet Take 20 mg by mouth every morning.        . polyethylene glycol powder (GLYCOLAX/MIRALAX) powder Take 17 g by mouth 3 (three) times daily as needed. For constipation       . senna (SENOKOT)  8.6 MG tablet Take 1 tablet by mouth daily.        Marland Kitchen DISCONTD: oxyCODONE-acetaminophen (PERCOCET) 5-325 MG per tablet Take 1 tablet by mouth every 4 (four) hours as needed.      Marland Kitchen oxyCODONE (OXY IR/ROXICODONE) 5 MG immediate release tablet Take 5-10 mg by mouth every 6 (six) hours as needed. pain         Review of Systems  Constitutional: Positive for fatigue. Negative for fever, chills, appetite change and unexpected weight change.  HENT: Negative for ear pain, congestion, sore throat, trouble swallowing, neck pain, voice change and sinus pressure.   Eyes: Negative for visual disturbance.  Respiratory: Negative for cough, shortness of breath, wheezing and stridor.   Cardiovascular: Positive for chest  pain (right lower chest wall). Negative for palpitations and leg swelling.  Gastrointestinal: Positive for abdominal pain and constipation (last stool 1 week ago). Negative for nausea, vomiting, diarrhea, blood in stool, abdominal distention and anal bleeding.  Genitourinary: Negative for dysuria and flank pain.  Musculoskeletal: Negative for myalgias, arthralgias and gait problem.  Skin: Negative for color change and rash.  Neurological: Positive for weakness and light-headedness. Negative for dizziness and headaches.  Hematological: Negative for adenopathy. Does not bruise/bleed easily.  Psychiatric/Behavioral: Negative for suicidal ideas, sleep disturbance and dysphoric mood. The patient is not nervous/anxious.    BP 132/62  Pulse 64  Temp(Src) 98.3 F (36.8 C) (Oral)  Wt 164 lb (74.39 kg)  SpO2 97%     Objective:   Physical Exam  Constitutional: She is oriented to person, place, and time. She appears well-developed and well-nourished. No distress.  HENT:  Head: Normocephalic and atraumatic.  Right Ear: External ear normal.  Left Ear: External ear normal.  Nose: Nose normal.  Mouth/Throat: Oropharynx is clear and moist. No oropharyngeal exudate.  Eyes: Conjunctivae are normal. Pupils are equal, round, and reactive to light. Right eye exhibits no discharge. Left eye exhibits no discharge. No scleral icterus.  Neck: Normal range of motion. Neck supple. No tracheal deviation present. No thyromegaly present.  Cardiovascular: Normal rate, regular rhythm, normal heart sounds and intact distal pulses.  Exam reveals no gallop and no friction rub.   No murmur heard. Pulmonary/Chest: Effort normal and breath sounds normal. No respiratory distress. She has no wheezes. She has no rales. She exhibits no tenderness.  Abdominal: Soft. Bowel sounds are normal. She exhibits no distension and no mass. There is tenderness (left lower quad and right upper quad over ribs). There is no rebound and no  guarding.  Musculoskeletal: Normal range of motion. She exhibits no edema and no tenderness.  Lymphadenopathy:    She has no cervical adenopathy.  Neurological: She is alert and oriented to person, place, and time. No cranial nerve deficit. She exhibits normal muscle tone. Coordination normal.  Skin: Skin is warm and dry. No rash noted. She is not diaphoretic. No erythema. No pallor.  Psychiatric: She has a normal mood and affect. Her behavior is normal. Judgment and thought content normal.          Assessment & Plan:

## 2011-06-04 NOTE — Assessment & Plan Note (Signed)
Will check TSH with labs today. 

## 2011-06-04 NOTE — Assessment & Plan Note (Signed)
Husband reports BG better controlled recently. Will continue current meds. Follow up 1 month.

## 2011-06-04 NOTE — Assessment & Plan Note (Signed)
[place] with vascular this week. Will follow.

## 2011-06-04 NOTE — Assessment & Plan Note (Signed)
Right sided chest wall pain after fall onto wooden table yesterday. Will get CXR to look for rib fracture. Will continue percocet prn pain.

## 2011-06-04 NOTE — Assessment & Plan Note (Signed)
Likely secondary to narcotic use for chronic pain.  Will add Amitiza today to see if any improvement. Continue increased fluids and metamucil. Follow up 1 month.

## 2011-06-04 NOTE — Assessment & Plan Note (Deleted)
Husband reports BG better controlled recently. Will continue current meds. Follow up 1 month.

## 2011-06-04 NOTE — Assessment & Plan Note (Signed)
Unclear etiology. CT abdomen unrevealing except for RAS noted on left. Has follow up US sched with vascular this week.  Continues to have constipation which [place] be contributing.  Will recheck labs today. If labs and renal US normal, then Markey ultimately need laparoscopic evaluation to determine etiology of pain. Will continue Percocet prn for severe pain.

## 2011-06-05 LAB — COMPREHENSIVE METABOLIC PANEL
AST: 27 U/L (ref 0–37)
Albumin: 3.8 g/dL (ref 3.5–5.2)
BUN: 21 mg/dL (ref 6–23)
Calcium: 9.3 mg/dL (ref 8.4–10.5)
Chloride: 104 mEq/L (ref 96–112)
Glucose, Bld: 264 mg/dL — ABNORMAL HIGH (ref 70–99)
Potassium: 4.6 mEq/L (ref 3.5–5.1)
Sodium: 139 mEq/L (ref 135–145)
Total Protein: 7 g/dL (ref 6.0–8.3)

## 2011-06-05 LAB — CBC WITH DIFFERENTIAL/PLATELET
Basophils Relative: 0.9 % (ref 0.0–3.0)
Eosinophils Absolute: 0.2 10*3/uL (ref 0.0–0.7)
Eosinophils Relative: 3.1 % (ref 0.0–5.0)
Lymphocytes Relative: 25.8 % (ref 12.0–46.0)
Neutrophils Relative %: 64.3 % (ref 43.0–77.0)
RBC: 4.29 Mil/uL (ref 3.87–5.11)
WBC: 7.8 10*3/uL (ref 4.5–10.5)

## 2011-06-05 LAB — TSH: TSH: 4.5 u[IU]/mL (ref 0.35–5.50)

## 2011-06-05 NOTE — Telephone Encounter (Signed)
Patient notified

## 2011-06-06 ENCOUNTER — Encounter: Payer: Self-pay | Admitting: Internal Medicine

## 2011-06-21 ENCOUNTER — Telehealth: Payer: Self-pay | Admitting: Internal Medicine

## 2011-06-21 MED ORDER — INSULIN NPH ISOPHANE & REGULAR (70-30) 100 UNIT/ML ~~LOC~~ SUSP: SUBCUTANEOUS | Status: DC

## 2011-06-21 NOTE — Telephone Encounter (Signed)
[facility] Triage Call Report Triage Record Num: [id number] Operator: Thereasa Parkin Patient Name: [facility] [place] Call Date & Time: 06/21/2011 3:44:24PM Patient Phone: [phone] PCP: Ronette Deter Patient Gender: Female PCP Fax : [phone] Patient DOB: May 20, 1945 Practice Name: [facility] Reason for Call: Caller: Thomas/Spouse; PCP: Ronette Deter; CB#: [phone]; Call regarding ongoing Left Side Pain; Onset: Afebrile. Had colonoscopy within past month. Caller put RN on hold then phone disconnected. Unable to reach caller with 3 call back attempts. Unable to complete triage d/t phone connection issues per No Contact or Duplicate Contact Call Guideline. Protocol(s) Used: No Contact or Duplicate Contact Calls (Adult) Recommended Outcome per Protocol: No Contact Reason for Outcome: Unable to complete triage due to phone connection issues Care Advice: ~ 06/21/2011 3:50:22PM Page 1 of 1 [email or web address]

## 2011-06-21 NOTE — Telephone Encounter (Signed)
RF completed 

## 2011-06-21 NOTE — Telephone Encounter (Signed)
Office Message from Date: 06/21/2011 12:00:00 AM Time of Call: [id number] Faxed To: [employer] (Daytime Triage) Caller: Marcello Moores Fax Number: [phone] Facility: N/A Patient: Andrea [name], Andrea [name] DOB: Mar 03, 1946 Phone: [phone] Provider: Ronette Deter Message: Needs refill 70/30 novlin patient is out and family member is at the pharmacy now. [place] [id number] Regarding Appointment: Appt Date: Appt Time: Unknown Provider: Reason: Details: Outcome: Message Taken by: Jeb Levering, CSR FAX [facility]   [address] [place], [place] [address]   P: [phone]   F: [phone]

## 2011-06-22 ENCOUNTER — Emergency Department: Payer: Self-pay | Admitting: Emergency Medicine

## 2011-06-22 ENCOUNTER — Telehealth: Payer: Self-pay | Admitting: Internal Medicine

## 2011-06-22 ENCOUNTER — Telehealth: Payer: Self-pay | Admitting: *Deleted

## 2011-06-22 LAB — COMPREHENSIVE METABOLIC PANEL
Albumin: 3.5 g/dL (ref 3.4–5.0)
Anion Gap: 10 (ref 7–16)
BUN: 10 mg/dL (ref 7–18)
Bilirubin,Total: 0.3 mg/dL (ref 0.2–1.0)
Co2: 28 mmol/L (ref 21–32)
Creatinine: 0.84 mg/dL (ref 0.60–1.30)
EGFR (Non-African Amer.): >60
Glucose: 304 mg/dL — ABNORMAL HIGH (ref 65–99)
Potassium: 4.5 mmol/L (ref 3.5–5.1)
SGOT(AST): 16 U/L (ref 15–37)
SGPT (ALT): 20 U/L
Sodium: 136 mmol/L (ref 136–145)
Total Protein: 7.2 g/dL (ref 6.4–8.2)

## 2011-06-22 LAB — CBC
HCT: 36.7 % (ref 35.0–47.0)
MCV: 85 fL (ref 80–100)
Platelet: 224 10*3/uL (ref 150–440)
RBC: 4.33 10*6/uL (ref 3.80–5.20)
RDW: 12.4 % (ref 11.5–14.5)
WBC: 7.9 10*3/uL (ref 3.6–11.0)

## 2011-06-22 LAB — LIPASE, BLOOD: Lipase: 115 U/L (ref 73–393)

## 2011-06-22 NOTE — Telephone Encounter (Signed)
I spoke w/Call a nurse - pt c/o severe left sided abd pain not relieved by bentyl or oxycodone. I advised ER, Call a nurse RN will inform pt

## 2011-06-22 NOTE — Telephone Encounter (Signed)
Needing to change order on her insuline. Hospital changed her insuline order to 40 am and 40 pm. The order that was sent to the pharmacy was 40 am and 30 pm. Wants the order changed to 40 am/40pm.

## 2011-06-22 NOTE — Telephone Encounter (Signed)
Pt needs to discuss at [place] Monday

## 2011-06-25 ENCOUNTER — Encounter: Payer: Self-pay | Admitting: Internal Medicine

## 2011-06-25 ENCOUNTER — Ambulatory Visit (INDEPENDENT_AMBULATORY_CARE_PROVIDER_SITE_OTHER): Payer: Medicare Other | Admitting: Internal Medicine

## 2011-06-25 ENCOUNTER — Telehealth: Payer: Self-pay | Admitting: Internal Medicine

## 2011-06-25 DIAGNOSIS — K5909 Other constipation: Secondary | ICD-10-CM

## 2011-06-25 DIAGNOSIS — K5900 Constipation, unspecified: Secondary | ICD-10-CM

## 2011-06-25 DIAGNOSIS — R1032 Left lower quadrant pain: Secondary | ICD-10-CM

## 2011-06-25 DIAGNOSIS — E039 Hypothyroidism, unspecified: Secondary | ICD-10-CM

## 2011-06-25 DIAGNOSIS — F329 Major depressive disorder, single episode, unspecified: Secondary | ICD-10-CM

## 2011-06-25 MED ORDER — LACTULOSE 10 GM/15ML PO SOLN: ORAL | Status: DC

## 2011-06-25 MED ORDER — PAROXETINE HCL 20 MG PO TABS: 20.0000 mg | ORAL_TABLET | ORAL | Status: DC

## 2011-06-25 NOTE — Telephone Encounter (Signed)
[facility] Triage Call Report Triage Record Num: [id number] Operator: Flonnie Hailstone Patient Name: [facility] [place] Call Date & Time: 06/22/2011 3:37:58PM Patient Phone: [phone] PCP: Ronette Deter Patient Gender: Female PCP Fax : [phone] Patient DOB: 1945-07-14 Practice Name: [facility] Reason for Call: Caller: Thomas/Spouse; PCP: Ronette Deter; CB#: [phone]; ; ; Call regarding Pain in the Left Side; Has been seen in office "2 weeks ago" due to pain in left side. Was seen by vascular MD since being seen and they are addressing kidney issue. Is taking Oxycodone and Dicyclomine but is not helping pain. Pain is constant for past week. Is voiding well. Pain is unbearable. Advised appointment but nothing available. Office/Sarah notified and advised ED. Protocol(s) Used: Flank Pain Recommended Outcome per Protocol: See ED Immediately Reason for Outcome: Unbearable abdominal/pelvic pain Care Advice: ~ 06/22/2011 3:53:33PM Page 1 of 1 [email or web address]

## 2011-06-25 NOTE — Assessment & Plan Note (Signed)
Severe. CT abdomen normal except for finding of left RAS, which is being evaluated by vascular.  Recent ED visit, pt had KUB showing stool throughout colon.  Suspect pain secondary to bowel distension. Will increase miralax to tid. Will start Lactulose every 2hr until BM. Will restart Paxil, as pt constipation much better on this med. Will also recheck Thyroid function today. Call with update 48hr.

## 2011-06-25 NOTE — Telephone Encounter (Signed)
[facility] Triage Call Report Triage Record Num: [id number] Operator: Ozella Almond Patient Name: Andrea [name] Call Date & Time: 06/22/2011 4:55:41PM Patient Phone: [phone] PCP: Ronette Deter Patient Gender: Female PCP Fax : [phone] Patient DOB: [birth year] Practice Name: [facility] Reason for Call: Caller: Thomas/Spouse; PCP: Ronette Deter; CB#: [phone]; ; ; Call regarding Questions About Advice She Just Got From Office; Spouse calling regarding just spoke with office and was advised to proceed to ED for side pain. States pt does not want to go, because "they" do not do anythig for her. Conf with Human resources officer at office. Protocol(s) Used: PCP Calls, No Triage (Adult) Recommended Outcome per Protocol: Call Provider Immediately Reason for Outcome: [1] ED authorization to see and treat requested AND [2] PCP wants to be involved Care Advice: ~ 06/22/2011 5:01:05PM Page 1 of 1 [email or web address]

## 2011-06-25 NOTE — Patient Instructions (Signed)
Start taking Miralax 1 capful in 8oz fluid three times daily.  Start Lactulose 59ml by mouth every 2 hours until bowel movement, up to 4 doses.  STOP medication after bowel movement.  Start Paxil 20mg  daily.  Use Gas-X as needed for abdominal pain. Try to avoid use of oxycodone.  We will check labs today.  Call on Wednesday to give update.

## 2011-06-25 NOTE — Progress Notes (Signed)
Subjective:    Patient ID: Andrea [name], female    DOB: May 18, 1945, 66 y.o.   MRN: [id number]  HPI 66YO female with DM, hypothyroidism presents for acute visit. C/o several weeks of left LLQ.  Has been evaluated with CT abdomen which was normal except for RAS left.  Recent KUB showed constipation. Currently using miralax bid with no BM in last 5 days.  Describes LLQ pain as crampy, sharp.  Pain keeps her up at night. Associated with some abdominal distension on left.  Seen by GI who recommended laxatives. She is out of laxatives except for miralax, was previously using senna. Denies nausea, vomiting.  No fever or chills. Has been using oxycodone for severe pain with minimal improvement.  Outpatient Encounter Prescriptions as of 06/25/2011  Medication Sig Dispense Refill  . acetaminophen (TYLENOL) 325 MG tablet Take 650 mg by mouth 3 (three) times daily as needed.        . Cholecalciferol (VITAMIN D HIGH POTENCY PO) Take 1 capsule by mouth daily.        Marland Kitchen dicyclomine (BENTYL) 10 MG capsule Take 10 mg by mouth 4 (four) times daily as needed. As needed for abdominal pain       . gabapentin (NEURONTIN) 600 MG tablet Take 600 mg by mouth 2 (two) times daily.       . insulin aspart (NOVOLOG) 100 UNIT/ML injection Inject 3-15 Units into the skin 3 (three) times daily before meals. Sliding scale       . insulin NPH (HUMULIN N,NOVOLIN N) 100 UNIT/ML injection Inject into the skin. 50 units in am and 50 in afternoon      . insulin NPH-insulin regular (NOVOLIN 70/30) (70-30) 100 UNIT/ML injection 40 units in AM and 30 units in PM  30 mL  3  . levothyroxine (SYNTHROID, LEVOTHROID) 137 MCG tablet Take 1 tablet (137 mcg total) by mouth daily.  30 tablet  6  . metoprolol (LOPRESSOR) 50 MG tablet Take 50 mg by mouth 2 (two) times daily.        Marland Kitchen omeprazole (PRILOSEC) 40 MG capsule Take 40 mg by mouth daily.        . polyethylene glycol powder (GLYCOLAX/MIRALAX) powder Take 17 g by mouth 3 (three) times daily as  needed. For constipation       . senna (SENOKOT) 8.6 MG tablet Take 1 tablet by mouth daily.        Marland Kitchen DISCONTD: oxyCODONE-acetaminophen (PERCOCET) 5-325 MG per tablet Take 1 tablet by mouth every 4 (four) hours as needed.  90 tablet  0  . lactulose (CHRONULAC) 10 GM/15ML solution Take 15 ml by mouth every 2 hours, up to 4 doses, until BM.  Once you have BM, stop medication.  240 mL  0  . PARoxetine (PAXIL) 20 MG tablet Take 1 tablet (20 mg total) by mouth every morning.  30 tablet  3  . DISCONTD: oxyCODONE (OXY IR/ROXICODONE) 5 MG immediate release tablet Take 5-10 mg by mouth every 6 (six) hours as needed. pain       . DISCONTD: PARoxetine (PAXIL) 20 MG tablet Take 20 mg by mouth every morning.          Review of Systems  Constitutional: Negative for fever, chills, appetite change, fatigue and unexpected weight change.  HENT: Negative for ear pain, congestion, sore throat, trouble swallowing, neck pain, voice change and sinus pressure.   Eyes: Negative for visual disturbance.  Respiratory: Negative for cough, shortness of breath, wheezing and stridor.  Cardiovascular: Negative for chest pain, palpitations and leg swelling.  Gastrointestinal: Positive for abdominal pain, constipation and abdominal distention. Negative for nausea, vomiting, diarrhea, blood in stool and anal bleeding.  Genitourinary: Negative for dysuria and flank pain.  Musculoskeletal: Negative for myalgias, arthralgias and gait problem.  Skin: Negative for color change and rash.  Neurological: Negative for dizziness and headaches.  Hematological: Negative for adenopathy. Does not bruise/bleed easily.  Psychiatric/Behavioral: Negative for suicidal ideas, sleep disturbance and dysphoric mood. The patient is not nervous/anxious.    BP 138/78  Pulse 95  Temp(Src) 98.2 F (36.8 C) (Oral)  Ht 5\' 1"  (1.549 m)  Wt 157 lb (71.215 kg)  BMI 29.67 kg/m2  SpO2 98%     Objective:   Physical Exam  Constitutional: She is  oriented to person, place, and time. She appears well-developed and well-nourished. No distress.  HENT:  Head: Normocephalic and atraumatic.  Right Ear: External ear normal.  Left Ear: External ear normal.  Nose: Nose normal.  Mouth/Throat: Oropharynx is clear and moist. No oropharyngeal exudate.  Eyes: Conjunctivae are normal. Pupils are equal, round, and reactive to light. Right eye exhibits no discharge. Left eye exhibits no discharge. No scleral icterus.  Neck: Normal range of motion. Neck supple. No tracheal deviation present. No thyromegaly present.  Cardiovascular: Normal rate, regular rhythm, normal heart sounds and intact distal pulses.  Exam reveals no gallop and no friction rub.   No murmur heard. Pulmonary/Chest: Effort normal and breath sounds normal. No respiratory distress. She has no wheezes. She has no rales. She exhibits no tenderness.  Abdominal: Soft. She exhibits distension (left abdomen). She exhibits no mass. Bowel sounds are decreased. There is tenderness (diffuse). There is no rebound and no guarding.  Musculoskeletal: Normal range of motion. She exhibits no edema and no tenderness.  Lymphadenopathy:    She has no cervical adenopathy.  Neurological: She is alert and oriented to person, place, and time. No cranial nerve deficit. She exhibits normal muscle tone. Coordination normal.  Skin: Skin is warm and dry. No rash noted. She is not diaphoretic. No erythema. No pallor.  Psychiatric: She has a normal mood and affect. Her behavior is normal. Judgment and thought content normal.          Assessment & Plan:

## 2011-06-26 LAB — T4, FREE: Free T4: 0.98 ng/dL (ref 0.60–1.60)

## 2011-06-27 ENCOUNTER — Emergency Department: Payer: Self-pay | Admitting: Internal Medicine

## 2011-06-27 LAB — URINALYSIS, COMPLETE
Bacteria: NONE SEEN
Glucose,UR: >=500 mg/dL (ref 0–75)
Hyaline Cast: 1
Ketone: NEGATIVE
RBC,UR: 1 /HPF (ref 0–5)
Specific Gravity: 1.009 (ref 1.003–1.030)
WBC UR: 6 /HPF (ref 0–5)

## 2011-06-27 LAB — COMPREHENSIVE METABOLIC PANEL
Albumin: 3.9 g/dL (ref 3.4–5.0)
Alkaline Phosphatase: 124 U/L (ref 50–136)
Calcium, Total: 9.6 mg/dL (ref 8.5–10.1)
Co2: 27 mmol/L (ref 21–32)
EGFR (Non-African Amer.): 44 — ABNORMAL LOW
Glucose: 389 mg/dL — ABNORMAL HIGH (ref 65–99)
Osmolality: 283 (ref 275–301)
SGOT(AST): 23 U/L (ref 15–37)
SGPT (ALT): 33 U/L
Sodium: 132 mmol/L — ABNORMAL LOW (ref 136–145)

## 2011-06-27 LAB — CBC
MCH: 28.9 pg (ref 26.0–34.0)
MCHC: 34 g/dL (ref 32.0–36.0)
MCV: 85 fL (ref 80–100)
Platelet: 263 10*3/uL (ref 150–440)
RDW: 12.3 % (ref 11.5–14.5)

## 2011-06-27 LAB — TROPONIN I: Troponin-I: <0.02 ng/mL

## 2011-07-03 ENCOUNTER — Telehealth: Payer: Self-pay | Admitting: *Deleted

## 2011-07-03 MED ORDER — LEVOTHYROXINE SODIUM 150 MCG PO TABS: 150.0000 ug | ORAL_TABLET | Freq: Every day | ORAL | Status: DC

## 2011-07-03 NOTE — Telephone Encounter (Signed)
Message copied by Gean Maidens on Tue Jul 03, 2011  1:16 PM ------      Message from: Ronette Deter A      Created: Wed Jun 27, 2011  7:59 AM       Thyroid function was slightly lower on labs. I would like to increase Synthroid to 151mcg daily and recheck TSH in 4 weeks.  This Channell help with constipation.

## 2011-07-03 NOTE — Telephone Encounter (Signed)
Left VM & # busy - see result note on labs

## 2011-07-11 ENCOUNTER — Ambulatory Visit (INDEPENDENT_AMBULATORY_CARE_PROVIDER_SITE_OTHER): Payer: Medicare Other | Admitting: Internal Medicine

## 2011-07-11 ENCOUNTER — Encounter: Payer: Self-pay | Admitting: Internal Medicine

## 2011-07-11 ENCOUNTER — Telehealth: Payer: Self-pay | Admitting: Internal Medicine

## 2011-07-11 DIAGNOSIS — K5909 Other constipation: Secondary | ICD-10-CM

## 2011-07-11 DIAGNOSIS — R531 Weakness: Secondary | ICD-10-CM

## 2011-07-11 DIAGNOSIS — R5381 Other malaise: Secondary | ICD-10-CM

## 2011-07-11 DIAGNOSIS — K5900 Constipation, unspecified: Secondary | ICD-10-CM

## 2011-07-11 DIAGNOSIS — E119 Type 2 diabetes mellitus without complications: Secondary | ICD-10-CM

## 2011-07-11 DIAGNOSIS — E039 Hypothyroidism, unspecified: Secondary | ICD-10-CM

## 2011-07-11 DIAGNOSIS — R1032 Left lower quadrant pain: Secondary | ICD-10-CM

## 2011-07-11 MED ORDER — INSULIN NPH ISOPHANE & REGULAR (70-30) 100 UNIT/ML ~~LOC~~ SUSP: SUBCUTANEOUS | Status: DC

## 2011-07-11 NOTE — Assessment & Plan Note (Signed)
Patient reports symptoms are much improved. Will continue MiraLax. Followup in one month.

## 2011-07-11 NOTE — Assessment & Plan Note (Signed)
Patient reports blood sugars are well controlled. Will plan to check hemoglobin A1c with next labs in one month. We'll continue current medications.

## 2011-07-11 NOTE — Progress Notes (Signed)
Subjective:    Patient ID: Andrea [name], female    DOB: 26-Jun-1945, 66 y.o.   MRN: [id number]  HPI 66 year old female with history of hypothyroidism status post myxedema coma, chronic constipation, left lower quadrant pain, and diabetes presents for followup. Her husband remains concerned that she is still weak. However, patient feels that her strength is improving. She did not increase her Synthroid dose to 150 mcg daily because she did not receive our message. However, she reports that energy is gradually improving. Her constipation has resolved with the use of MiraLax daily. Her left lower quadrant pain has also resolved. She reports that her blood sugars have been fairly well-controlled and fasting sugars are typically near 175. She reports full compliance with her insulin.  She does note that she fell forward onto her abdomen from a standing position while trying to carry her cat outside yesterday. She denies any loss of consciousness or known injury during the fall. She does have some tenderness over her lower chest and upper abdomen but has not had any bruising or swelling in this area. She does not feel that weakness contributed to the fall, she reports that she lost her balance because the cat was squirming out of her hands.  Outpatient Encounter Prescriptions as of 07/11/2011  Medication Sig Dispense Refill  . acetaminophen (TYLENOL) 325 MG tablet Take 650 mg by mouth 3 (three) times daily as needed.        . Cholecalciferol (VITAMIN D HIGH POTENCY PO) Take 1 capsule by mouth daily.        Marland Kitchen dicyclomine (BENTYL) 10 MG capsule Take 10 mg by mouth 4 (four) times daily as needed. As needed for abdominal pain       . gabapentin (NEURONTIN) 600 MG tablet Take 600 mg by mouth 2 (two) times daily.       . insulin aspart (NOVOLOG) 100 UNIT/ML injection Inject 3-15 Units into the skin 3 (three) times daily before meals. Sliding scale       . insulin NPH (HUMULIN N,NOVOLIN N) 100 UNIT/ML injection  Inject into the skin. 50 units in am and 50 in afternoon      . insulin NPH-insulin regular (NOVOLIN 70/30) (70-30) 100 UNIT/ML injection 40 units in AM and 40 units in PM  10 mL  3  . lactulose (CHRONULAC) 10 GM/15ML solution Take 15 ml by mouth every 2 hours, up to 4 doses, until BM.  Once you have BM, stop medication.  240 mL  0  . levothyroxine (SYNTHROID, LEVOTHROID) 150 MCG tablet Take 1 tablet (150 mcg total) by mouth daily. REPEAT LABS IN 4 WKS  90 tablet  0  . metoprolol (LOPRESSOR) 50 MG tablet Take 50 mg by mouth 2 (two) times daily.        Marland Kitchen omeprazole (PRILOSEC) 40 MG capsule Take 40 mg by mouth daily.        Marland Kitchen PARoxetine (PAXIL) 20 MG tablet Take 1 tablet (20 mg total) by mouth every morning.  30 tablet  3  . polyethylene glycol powder (GLYCOLAX/MIRALAX) powder Take 17 g by mouth 3 (three) times daily as needed. For constipation       . senna (SENOKOT) 8.6 MG tablet Take 1 tablet by mouth daily.          Review of Systems  Constitutional: Negative for fever, chills, appetite change, fatigue and unexpected weight change.  HENT: Negative for ear pain, congestion, sore throat, trouble swallowing, neck pain, voice change and sinus pressure.  Eyes: Negative for visual disturbance.  Respiratory: Negative for cough, shortness of breath, wheezing and stridor.   Cardiovascular: Negative for chest pain, palpitations and leg swelling.  Gastrointestinal: Positive for constipation. Negative for nausea, vomiting, abdominal pain, diarrhea, blood in stool, abdominal distention and anal bleeding.  Genitourinary: Negative for dysuria and flank pain.  Musculoskeletal: Negative for myalgias, arthralgias and gait problem.  Skin: Negative for color change and rash.  Neurological: Negative for dizziness and headaches.  Hematological: Negative for adenopathy. Does not bruise/bleed easily.  Psychiatric/Behavioral: Negative for suicidal ideas, sleep disturbance and dysphoric mood. The patient is not  nervous/anxious.    BP 138/82  Pulse 64  Temp(Src) 98.2 F (36.8 C) (Oral)  Ht 5\' 1"  (1.549 m)  Wt 159 lb (72.122 kg)  BMI 30.04 kg/m2  SpO2 99%     Objective:   Physical Exam  Constitutional: She is oriented to person, place, and time. She appears well-developed and well-nourished. No distress.  HENT:  Head: Normocephalic and atraumatic.  Right Ear: External ear normal.  Left Ear: External ear normal.  Nose: Nose normal.  Mouth/Throat: Oropharynx is clear and moist. No oropharyngeal exudate.  Eyes: Conjunctivae are normal. Pupils are equal, round, and reactive to light. Right eye exhibits no discharge. Left eye exhibits no discharge. No scleral icterus.  Neck: Normal range of motion. Neck supple. No tracheal deviation present. No thyromegaly present.  Cardiovascular: Normal rate, regular rhythm, normal heart sounds and intact distal pulses.  Exam reveals no gallop and no friction rub.   No murmur heard. Pulmonary/Chest: Effort normal and breath sounds normal. No respiratory distress. She has no wheezes. She has no rales. She exhibits no tenderness.  Abdominal: Soft. She exhibits no distension. There is no tenderness.  Musculoskeletal: Normal range of motion. She exhibits no edema and no tenderness.  Lymphadenopathy:    She has no cervical adenopathy.  Neurological: She is alert and oriented to person, place, and time. No cranial nerve deficit. She exhibits normal muscle tone. Coordination normal.  Skin: Skin is warm and dry. No rash noted. She is not diaphoretic. No erythema. No pallor.  Psychiatric: She has a normal mood and affect. Her behavior is normal. Judgment and thought content normal.          Assessment & Plan:

## 2011-07-11 NOTE — Assessment & Plan Note (Signed)
Patient did not increase Synthroid dose to 150 mcg daily. Will plan to increase starting tomorrow. Patient will have TSH rechecked in one month.

## 2011-07-11 NOTE — Telephone Encounter (Signed)
Office Message [address] [place], [place] [address] p. [phone] f. [phone] To: [place] (Daytime Triage) Fax: [phone] From: [facility] Date/ Time: 07/11/2011 10:14 AM Taken By: Roger Kill, CSR Caller: [place]: not collected Patient: [name], Andrea DOB: April 09, 1946 Phone: [phone] Reason for Call: Husband calling stating Shirlean Mylar wanted patinet to call her but not sure if regarding appointment or not. Regarding Appointment: Appt Date: Appt Time: Unknown Provider: Reason: Details: Outcome:

## 2011-07-11 NOTE — Assessment & Plan Note (Signed)
Symptoms have resolved with resolution of constipation. We'll continue to monitor.

## 2011-07-11 NOTE — Patient Instructions (Signed)
Change Synthroid to 11mcg daily.  Repeat labs in 1 month.  We will set up physical therapy for strength training.  Follow up in 1 month.

## 2011-07-12 NOTE — Telephone Encounter (Signed)
Pt seen in office on 2/27

## 2011-07-26 ENCOUNTER — Other Ambulatory Visit: Payer: Self-pay | Admitting: Vascular Surgery

## 2011-07-26 LAB — CREATININE, SERUM
Creatinine: 0.85 mg/dL (ref 0.60–1.30)
EGFR (African American): >60
EGFR (Non-African Amer.): >60

## 2011-08-02 ENCOUNTER — Ambulatory Visit: Payer: Self-pay | Admitting: Vascular Surgery

## 2011-08-03 ENCOUNTER — Telehealth: Payer: Self-pay | Admitting: Internal Medicine

## 2011-08-03 NOTE — Telephone Encounter (Signed)
[facility] gave Hilda Blades @ [facility] the referral for ms [facility].  [facility] didn't take pt insurance.  Hilda Blades wanted to let you know that have a left several message for [facility] with no response. Hilda Blades called to see if we had any additional phone numbers.  The only number we have is the one Hilda Blades has.

## 2011-08-13 ENCOUNTER — Ambulatory Visit (INDEPENDENT_AMBULATORY_CARE_PROVIDER_SITE_OTHER): Payer: Medicare Other | Admitting: Internal Medicine

## 2011-08-13 ENCOUNTER — Encounter: Payer: Self-pay | Admitting: Neurology

## 2011-08-13 ENCOUNTER — Telehealth: Payer: Self-pay | Admitting: Internal Medicine

## 2011-08-13 ENCOUNTER — Encounter: Payer: Self-pay | Admitting: Internal Medicine

## 2011-08-13 DIAGNOSIS — R29898 Other symptoms and signs involving the musculoskeletal system: Secondary | ICD-10-CM | POA: Insufficient documentation

## 2011-08-13 DIAGNOSIS — R0789 Other chest pain: Secondary | ICD-10-CM | POA: Insufficient documentation

## 2011-08-13 DIAGNOSIS — R071 Chest pain on breathing: Secondary | ICD-10-CM

## 2011-08-13 DIAGNOSIS — E119 Type 2 diabetes mellitus without complications: Secondary | ICD-10-CM

## 2011-08-13 DIAGNOSIS — M7072 Other bursitis of hip, left hip: Secondary | ICD-10-CM | POA: Insufficient documentation

## 2011-08-13 DIAGNOSIS — M76899 Other specified enthesopathies of unspecified lower limb, excluding foot: Secondary | ICD-10-CM

## 2011-08-13 DIAGNOSIS — E039 Hypothyroidism, unspecified: Secondary | ICD-10-CM

## 2011-08-13 MED ORDER — INSULIN NPH ISOPHANE & REGULAR (70-30) 100 UNIT/ML ~~LOC~~ SUSP: SUBCUTANEOUS | Status: DC

## 2011-08-13 NOTE — Assessment & Plan Note (Signed)
Chronic. Improved in past with steroid injection. Will set her back up with ortho for evaluation and possible repeat injection.

## 2011-08-13 NOTE — Telephone Encounter (Signed)
Gar[facility] @ [facility] called,  [facility] gave her the referral and she has been leaving several message at [facility] home phone # and no one has called her back.  She wanted to let you know that she is rejected the referral, due to no one calling her back

## 2011-08-13 NOTE — Assessment & Plan Note (Signed)
After fall from standing position. No abnormalities noted on physical exam. Will get CXR to eval for rib fracture.

## 2011-08-13 NOTE — Assessment & Plan Note (Signed)
Last TSH was elevated. Pt is unsure of synthroid dose she is taking, however husband reports she is compliant with 140mcg daily. Will recheck TSH with labs today. Follow up 1 month.

## 2011-08-13 NOTE — Assessment & Plan Note (Signed)
BG elevated per pt. Will check A1c with labs today. Will increase Insulin to 50 units of 70/30 bid.  Follow up1 month.

## 2011-08-13 NOTE — Assessment & Plan Note (Signed)
Persistent despite correction of thyroid function and better control (however not ideal) of blood sugar.  Has had several falls at home.  Decreased strength in bilateral lower extremities noted on exam. Will set up with neurology for further evaluation.

## 2011-08-13 NOTE — Progress Notes (Signed)
Subjective:    Patient ID: Andrea [name], female    DOB: 12-27-1945, 66 y.o.   MRN: [id number]  HPI 66YO female with h/o hypothyroidism, diabetes mellitus presents for follow up.  In regards to hypothyroidism, she is unsure what does of thyroid medication she has been taking. Husband, however reports she has been compliant with Synthroid dose of 165mcg daily.   In regards to DM, she notes compliance with insulin 70/30 40 units bid.  She has not returned phone calls of home health nurse, and no assistance has yet been provided. She notes her BG have been elevated, sometimes near 300.  She denies any low BG.  Her primary concern today is recurrent falls. She notes significant weakness in her legs. She notes recurrent episodes during which she loses her balance and falls typically from a standing position. She denies any loss of consciousness or lightheadedness prior to her falls. She denies any seizure-like activity. She first developed weakness when she was hospitalized for myxedema coma in November 2012. Her symptoms have improved somewhat since that time.  Outpatient Encounter Prescriptions as of 08/13/2011  Medication Sig Dispense Refill  . acetaminophen (TYLENOL) 325 MG tablet Take 650 mg by mouth 3 (three) times daily as needed.        . Cholecalciferol (VITAMIN D HIGH POTENCY PO) Take 1 capsule by mouth daily.        Marland Kitchen dicyclomine (BENTYL) 10 MG capsule Take 10 mg by mouth 4 (four) times daily as needed. As needed for abdominal pain       . gabapentin (NEURONTIN) 600 MG tablet Take 600 mg by mouth 2 (two) times daily.       . insulin aspart (NOVOLOG) 100 UNIT/ML injection Inject 3-15 Units into the skin 3 (three) times daily before meals. Sliding scale       . insulin NPH-insulin regular (NOVOLIN 70/30) (70-30) 100 UNIT/ML injection 50 units in AM and 50 units in PM  10 mL  3  . lactulose (CHRONULAC) 10 GM/15ML solution Take 15 ml by mouth every 2 hours, up to 4 doses, until BM.  Once you have BM,  stop medication.  240 mL  0  . levothyroxine (SYNTHROID, LEVOTHROID) 150 MCG tablet Take 1 tablet (150 mcg total) by mouth daily. REPEAT LABS IN 4 WKS  90 tablet  0  . metoprolol (LOPRESSOR) 50 MG tablet Take 50 mg by mouth 2 (two) times daily.        Marland Kitchen omeprazole (PRILOSEC) 40 MG capsule Take 40 mg by mouth daily.        Marland Kitchen PARoxetine (PAXIL) 20 MG tablet Take 1 tablet (20 mg total) by mouth every morning.  30 tablet  3  . polyethylene glycol powder (GLYCOLAX/MIRALAX) powder Take 17 g by mouth 3 (three) times daily as needed. For constipation       . senna (SENOKOT) 8.6 MG tablet Take 1 tablet by mouth daily.        Marland Kitchen DISCONTD: insulin NPH-insulin regular (NOVOLIN 70/30) (70-30) 100 UNIT/ML injection 40 units in AM and 40 units in PM  10 mL  3    Review of Systems  Constitutional: Positive for fatigue. Negative for fever, chills, appetite change and unexpected weight change.  HENT: Negative for ear pain, congestion, sore throat, trouble swallowing, neck pain, voice change and sinus pressure.   Eyes: Negative for visual disturbance.  Respiratory: Negative for cough, shortness of breath, wheezing and stridor.   Cardiovascular: Positive for chest pain. Negative for  palpitations and leg swelling.  Gastrointestinal: Negative for nausea, vomiting, abdominal pain, diarrhea, constipation, blood in stool, abdominal distention and anal bleeding.  Genitourinary: Negative for dysuria and flank pain.  Musculoskeletal: Negative for myalgias, arthralgias and gait problem.  Skin: Negative for color change and rash.  Neurological: Positive for weakness. Negative for dizziness, seizures, syncope, speech difficulty and headaches.  Hematological: Negative for adenopathy. Does not bruise/bleed easily.  Psychiatric/Behavioral: Negative for suicidal ideas, sleep disturbance and dysphoric mood. The patient is not nervous/anxious.       BP 112/62  Pulse 78  Temp(Src) 98.3 F (36.8 C) (Oral)  Ht 5\' 1"  (1.549 m)   Wt 160 lb (72.576 kg)  BMI 30.23 kg/m2  SpO2 98%  Objective:   Physical Exam  Constitutional: She is oriented to person, place, and time. She appears well-developed and well-nourished. No distress.  HENT:  Head: Normocephalic and atraumatic.  Right Ear: External ear normal.  Left Ear: External ear normal.  Nose: Nose normal.  Mouth/Throat: Oropharynx is clear and moist. No oropharyngeal exudate.  Eyes: Conjunctivae are normal. Pupils are equal, round, and reactive to light. Right eye exhibits no discharge. Left eye exhibits no discharge. No scleral icterus.  Neck: Normal range of motion. Neck supple. No tracheal deviation present. No thyromegaly present.  Cardiovascular: Normal rate, regular rhythm, normal heart sounds and intact distal pulses.  Exam reveals no gallop and no friction rub.   No murmur heard. Pulmonary/Chest: Effort normal and breath sounds normal. No respiratory distress. She has no wheezes. She has no rales. She exhibits no tenderness.  Abdominal: Soft. Bowel sounds are normal. She exhibits no distension and no mass. There is no tenderness. There is no rebound and no guarding.  Musculoskeletal: Normal range of motion. She exhibits no edema and no tenderness.  Lymphadenopathy:    She has no cervical adenopathy.  Neurological: She is alert and oriented to person, place, and time. She displays no atrophy and no tremor. No cranial nerve deficit or sensory deficit. She exhibits normal muscle tone. Gait abnormal. Coordination normal.       Strength 4/5 in hip flexors/extensors.  Skin: Skin is warm and dry. No rash noted. She is not diaphoretic. No erythema. No pallor.  Psychiatric: She has a normal mood and affect. Judgment and thought content normal. Her speech is delayed. She is withdrawn.          Assessment & Plan:

## 2011-08-14 ENCOUNTER — Ambulatory Visit (INDEPENDENT_AMBULATORY_CARE_PROVIDER_SITE_OTHER)
Admission: RE | Admit: 2011-08-14 | Discharge: 2011-08-14 | Disposition: A | Discharge status: Home / Self Care | Payer: Medicare Other | Source: Ambulatory Visit | Attending: Internal Medicine | Admitting: Internal Medicine

## 2011-08-14 DIAGNOSIS — R071 Chest pain on breathing: Secondary | ICD-10-CM

## 2011-08-14 DIAGNOSIS — R0789 Other chest pain: Secondary | ICD-10-CM

## 2011-08-14 LAB — COMPREHENSIVE METABOLIC PANEL
AST: 23 U/L (ref 0–37)
Albumin: 3.7 g/dL (ref 3.5–5.2)
BUN: 20 mg/dL (ref 6–23)
CO2: 27 mEq/L (ref 19–32)
Calcium: 9.3 mg/dL (ref 8.4–10.5)
Chloride: 97 mEq/L (ref 96–112)
Creatinine, Ser: 0.8 mg/dL (ref 0.4–1.2)
GFR: 80.97 mL/min (ref >60.00)
Glucose, Bld: 193 mg/dL — ABNORMAL HIGH (ref 70–99)
Potassium: 4.3 mEq/L (ref 3.5–5.1)

## 2011-08-22 ENCOUNTER — Other Ambulatory Visit: Payer: Self-pay | Admitting: *Deleted

## 2011-08-22 MED ORDER — OMEPRAZOLE 40 MG PO CPDR: 40.0000 mg | DELAYED_RELEASE_CAPSULE | Freq: Every day | ORAL | Status: DC

## 2011-09-26 ENCOUNTER — Other Ambulatory Visit: Payer: Self-pay | Admitting: Internal Medicine

## 2011-09-30 ENCOUNTER — Other Ambulatory Visit: Payer: Self-pay | Admitting: Internal Medicine

## 2011-10-01 ENCOUNTER — Other Ambulatory Visit: Payer: Self-pay | Admitting: Internal Medicine

## 2011-10-01 MED ORDER — GABAPENTIN 800 MG PO TABS: 800.0000 mg | ORAL_TABLET | Freq: Two times a day (BID) | ORAL | Status: DC

## 2011-10-01 NOTE — Telephone Encounter (Signed)
We need to confirm her dose. There were two doses in the computer.

## 2011-10-01 NOTE — Telephone Encounter (Signed)
OK to fill 800mg  po bid.

## 2011-10-01 NOTE — Telephone Encounter (Signed)
Pt calling needing refill on Gabapentin. She has contacted her  [facility]  And   was told that her doctor advised that it wasn't appropriate. Pt is wanting to  know why it cant be filled b/c it was last filled by Ronette Deter -- Pt can be called back at [phone]

## 2011-10-01 NOTE — Telephone Encounter (Signed)
Patient says that she is on the 800 mg bid. Is it okay to fill?

## 2011-10-01 NOTE — Telephone Encounter (Signed)
Need to confirm with pt, which dose she is taking

## 2011-10-02 ENCOUNTER — Other Ambulatory Visit: Payer: Self-pay | Admitting: Internal Medicine

## 2011-10-03 ENCOUNTER — Ambulatory Visit: Payer: Self-pay | Admitting: Vascular Surgery

## 2011-10-03 LAB — CBC
HCT: 35.7 % (ref 35.0–47.0)
MCH: 28.2 pg (ref 26.0–34.0)
MCHC: 33.6 g/dL (ref 32.0–36.0)
MCV: 84 fL (ref 80–100)
RBC: 4.27 10*6/uL (ref 3.80–5.20)
WBC: 9.4 10*3/uL (ref 3.6–11.0)

## 2011-10-03 LAB — BASIC METABOLIC PANEL
Anion Gap: 5 — ABNORMAL LOW (ref 7–16)
BUN: 18 mg/dL (ref 7–18)
Co2: 31 mmol/L (ref 21–32)
EGFR (African American): >60
EGFR (Non-African Amer.): >60
Potassium: 4.3 mmol/L (ref 3.5–5.1)

## 2011-10-04 ENCOUNTER — Ambulatory Visit: Payer: Medicare Other | Admitting: Neurology

## 2011-10-10 ENCOUNTER — Inpatient Hospital Stay: Payer: Self-pay | Admitting: Vascular Surgery

## 2011-10-11 LAB — CBC WITH DIFFERENTIAL/PLATELET
Basophil #: 0.1 10*3/uL (ref 0.0–0.1)
Basophil %: 1.3 %
Eosinophil #: 0.2 10*3/uL (ref 0.0–0.7)
HCT: 33.5 % — ABNORMAL LOW (ref 35.0–47.0)
HGB: 11.1 g/dL — ABNORMAL LOW (ref 12.0–16.0)
Lymphocyte %: 24.5 %
MCH: 28.2 pg (ref 26.0–34.0)
MCHC: 33.3 g/dL (ref 32.0–36.0)
Monocyte #: 0.5 x10 3/mm (ref 0.2–0.9)
Neutrophil #: 5.3 10*3/uL (ref 1.4–6.5)
Neutrophil %: 64.6 %
RDW: 13.5 % (ref 11.5–14.5)
WBC: 8.2 10*3/uL (ref 3.6–11.0)

## 2011-10-11 LAB — PROTIME-INR
INR: 1
Prothrombin Time: 13.1 secs (ref 11.5–14.7)

## 2011-10-11 LAB — BASIC METABOLIC PANEL
BUN: 12 mg/dL (ref 7–18)
Chloride: 103 mmol/L (ref 98–107)
Creatinine: 0.87 mg/dL (ref 0.60–1.30)
EGFR (African American): >60
EGFR (Non-African Amer.): >60
Glucose: 200 mg/dL — ABNORMAL HIGH (ref 65–99)
Osmolality: 283 (ref 275–301)
Potassium: 4.4 mmol/L (ref 3.5–5.1)

## 2011-10-11 LAB — APTT: Activated PTT: 28.5 secs (ref 23.6–35.9)

## 2011-10-12 LAB — PATHOLOGY REPORT

## 2011-10-22 ENCOUNTER — Emergency Department: Payer: Self-pay | Admitting: Emergency Medicine

## 2011-10-22 LAB — COMPREHENSIVE METABOLIC PANEL
Alkaline Phosphatase: 162 U/L — ABNORMAL HIGH (ref 50–136)
BUN: 16 mg/dL (ref 7–18)
Chloride: 103 mmol/L (ref 98–107)
Co2: 28 mmol/L (ref 21–32)
Creatinine: 0.78 mg/dL (ref 0.60–1.30)
EGFR (African American): >60
Glucose: 140 mg/dL — ABNORMAL HIGH (ref 65–99)
SGOT(AST): 34 U/L (ref 15–37)
Sodium: 139 mmol/L (ref 136–145)

## 2011-10-22 LAB — URINALYSIS, COMPLETE
Ketone: NEGATIVE
Nitrite: NEGATIVE
RBC,UR: 1 /HPF (ref 0–5)
Specific Gravity: 1.023 (ref 1.003–1.030)
Squamous Epithelial: 1
WBC UR: 3 /HPF (ref 0–5)

## 2011-10-22 LAB — CK TOTAL AND CKMB (NOT AT ARMC)
CK, Total: 24 U/L (ref 21–215)
CK-MB: 0.8 ng/mL (ref 0.5–3.6)

## 2011-10-22 LAB — CBC
HCT: 33.9 % — ABNORMAL LOW (ref 35.0–47.0)
MCH: 27.9 pg (ref 26.0–34.0)
MCHC: 33 g/dL (ref 32.0–36.0)
Platelet: 221 10*3/uL (ref 150–440)
RBC: 4.01 10*6/uL (ref 3.80–5.20)
WBC: 10 10*3/uL (ref 3.6–11.0)

## 2011-11-08 ENCOUNTER — Other Ambulatory Visit: Payer: Self-pay | Admitting: Internal Medicine

## 2011-11-11 ENCOUNTER — Other Ambulatory Visit: Payer: Self-pay | Admitting: Internal Medicine

## 2011-11-13 ENCOUNTER — Inpatient Hospital Stay: Payer: Self-pay | Admitting: Internal Medicine

## 2011-11-13 LAB — LIPID PANEL
HDL Cholesterol: 33 mg/dL — ABNORMAL LOW (ref 40–60)
Triglycerides: 564 mg/dL — ABNORMAL HIGH (ref 0–200)

## 2011-11-13 LAB — CBC
HCT: 38.1 % (ref 35.0–47.0)
HGB: 12.7 g/dL (ref 12.0–16.0)
MCH: 28 pg (ref 26.0–34.0)
MCHC: 33.4 g/dL (ref 32.0–36.0)
MCV: 84 fL (ref 80–100)
Platelet: 195 10*3/uL (ref 150–440)
RBC: 4.54 10*6/uL (ref 3.80–5.20)
WBC: 8.8 10*3/uL (ref 3.6–11.0)

## 2011-11-13 LAB — TROPONIN I: Troponin-I: <0.02 ng/mL

## 2011-11-13 LAB — BASIC METABOLIC PANEL
Anion Gap: 8 (ref 7–16)
EGFR (African American): >60
Glucose: 347 mg/dL — ABNORMAL HIGH (ref 65–99)
Osmolality: 288 (ref 275–301)
Sodium: 135 mmol/L — ABNORMAL LOW (ref 136–145)

## 2011-11-14 ENCOUNTER — Encounter: Payer: Self-pay | Admitting: Internal Medicine

## 2011-11-14 LAB — CK TOTAL AND CKMB (NOT AT ARMC)
CK, Total: 68 U/L (ref 21–215)
CK, Total: 29 U/L (ref 21–215)
CK-MB: 0.9 ng/mL (ref 0.5–3.6)
CK-MB: 1 ng/mL (ref 0.5–3.6)

## 2011-11-14 LAB — COMPREHENSIVE METABOLIC PANEL
Albumin: 3.6 g/dL (ref 3.4–5.0)
Alkaline Phosphatase: 150 U/L — ABNORMAL HIGH (ref 50–136)
Anion Gap: 9 (ref 7–16)
BUN: 20 mg/dL — ABNORMAL HIGH (ref 7–18)
Bilirubin,Total: 0.3 mg/dL (ref 0.2–1.0)
Chloride: 97 mmol/L — ABNORMAL LOW (ref 98–107)
Creatinine: 1.07 mg/dL (ref 0.60–1.30)
EGFR (Non-African Amer.): 54 — ABNORMAL LOW
Glucose: 287 mg/dL — ABNORMAL HIGH (ref 65–99)
Potassium: 4.2 mmol/L (ref 3.5–5.1)
SGOT(AST): 24 U/L (ref 15–37)
Sodium: 134 mmol/L — ABNORMAL LOW (ref 136–145)
Total Protein: 7.7 g/dL (ref 6.4–8.2)

## 2011-11-14 LAB — CBC WITH DIFFERENTIAL/PLATELET
Basophil %: 0.9 %
Eosinophil #: 0.1 10*3/uL (ref 0.0–0.7)
Eosinophil %: 1.5 %
HCT: 38.7 % (ref 35.0–47.0)
MCH: 28.2 pg (ref 26.0–34.0)
MCHC: 34 g/dL (ref 32.0–36.0)
MCV: 83 fL (ref 80–100)
Monocyte #: 0.4 x10 3/mm (ref 0.2–0.9)
Neutrophil #: 7.4 10*3/uL — ABNORMAL HIGH (ref 1.4–6.5)
RBC: 4.67 10*6/uL (ref 3.80–5.20)
WBC: 9.4 10*3/uL (ref 3.6–11.0)

## 2011-11-14 LAB — URINALYSIS, COMPLETE
Bacteria: NONE SEEN
Glucose,UR: >=500 mg/dL (ref 0–75)
Leukocyte Esterase: NEGATIVE
Nitrite: NEGATIVE
Specific Gravity: 1.023 (ref 1.003–1.030)
Squamous Epithelial: 1
WBC UR: 3 /HPF (ref 0–5)

## 2011-11-14 LAB — LIPID PANEL
HDL Cholesterol: 40 mg/dL (ref 40–60)
Triglycerides: 319 mg/dL — ABNORMAL HIGH (ref 0–200)
VLDL Cholesterol, Calc: 64 mg/dL — ABNORMAL HIGH (ref 5–40)

## 2011-11-14 LAB — TROPONIN I: Troponin-I: <0.02 ng/mL

## 2011-11-15 LAB — CBC
HCT: 40.3 % (ref 35.0–47.0)
MCHC: 33.9 g/dL (ref 32.0–36.0)
RDW: 14.1 % (ref 11.5–14.5)
WBC: 10.4 10*3/uL (ref 3.6–11.0)

## 2011-11-15 LAB — PROTIME-INR
INR: 0.9
Prothrombin Time: 12.2 secs (ref 11.5–14.7)

## 2011-11-16 ENCOUNTER — Observation Stay: Payer: Self-pay | Admitting: Internal Medicine

## 2011-11-16 LAB — COMPREHENSIVE METABOLIC PANEL
Anion Gap: 7 (ref 7–16)
Bilirubin,Total: 0.4 mg/dL (ref 0.2–1.0)
Calcium, Total: 9.6 mg/dL (ref 8.5–10.1)
Chloride: 98 mmol/L (ref 98–107)
Co2: 29 mmol/L (ref 21–32)
Creatinine: 0.97 mg/dL (ref 0.60–1.30)
EGFR (African American): >60
EGFR (Non-African Amer.): >60
Osmolality: 286 (ref 275–301)
SGOT(AST): 54 U/L — ABNORMAL HIGH (ref 15–37)
SGPT (ALT): 28 U/L
Sodium: 134 mmol/L — ABNORMAL LOW (ref 136–145)

## 2011-11-16 LAB — URINALYSIS, COMPLETE
Bilirubin,UR: NEGATIVE
Blood: NEGATIVE
Nitrite: NEGATIVE
Ph: 5 (ref 4.5–8.0)
RBC,UR: 3 /HPF (ref 0–5)
Squamous Epithelial: 2

## 2011-11-16 LAB — CK TOTAL AND CKMB (NOT AT ARMC)
CK-MB: 1.5 ng/mL (ref 0.5–3.6)
CK-MB: 0.7 ng/mL (ref 0.5–3.6)
CK-MB: 1.3 ng/mL (ref 0.5–3.6)

## 2011-11-16 LAB — TROPONIN I
Troponin-I: <0.02 ng/mL
Troponin-I: <0.02 ng/mL

## 2011-11-17 LAB — BASIC METABOLIC PANEL
Anion Gap: 9 (ref 7–16)
BUN: 26 mg/dL — ABNORMAL HIGH (ref 7–18)
Calcium, Total: 9.5 mg/dL (ref 8.5–10.1)
Chloride: 104 mmol/L (ref 98–107)
Co2: 28 mmol/L (ref 21–32)
Creatinine: 0.9 mg/dL (ref 0.60–1.30)
EGFR (African American): >60
EGFR (Non-African Amer.): >60
Glucose: 167 mg/dL — ABNORMAL HIGH (ref 65–99)
Osmolality: 290 (ref 275–301)
Potassium: 4.3 mmol/L (ref 3.5–5.1)
Sodium: 141 mmol/L (ref 136–145)

## 2011-11-17 LAB — CBC WITH DIFFERENTIAL/PLATELET
Basophil %: 1.3 %
Eosinophil %: 4.7 %
HGB: 12.4 g/dL (ref 12.0–16.0)
Lymphocyte #: 2.7 10*3/uL (ref 1.0–3.6)
MCH: 28 pg (ref 26.0–34.0)
MCV: 83 fL (ref 80–100)
Neutrophil #: 4.1 10*3/uL (ref 1.4–6.5)
Neutrophil %: 51.7 %
Platelet: 176 10*3/uL (ref 150–440)
RBC: 4.44 10*6/uL (ref 3.80–5.20)
RDW: 13.8 % (ref 11.5–14.5)
WBC: 7.9 10*3/uL (ref 3.6–11.0)

## 2011-11-19 ENCOUNTER — Telehealth: Payer: Self-pay | Admitting: Internal Medicine

## 2011-11-19 NOTE — Telephone Encounter (Signed)
No,  I do not think it is a good idea to switch doctors in such a samall practice.  Refer her to Dr. Heber Thaxton

## 2011-11-19 NOTE — Telephone Encounter (Signed)
Patient would like to switch PCP, patient feels Dr Derrel Nip would be able to help with her heart/cholestrol issues. Can patient switch?

## 2011-11-19 NOTE — Telephone Encounter (Signed)
That is fine with me. However, will need to check with Helene Kelp

## 2011-11-20 NOTE — Telephone Encounter (Signed)
Patient originally was with Dr. Derrel Nip at [facility] her husband is wanting her to go back to Dr. Derrel Nip . She was changed when she scheduled here and her husband does not know why.

## 2011-11-21 ENCOUNTER — Telehealth: Payer: Self-pay | Admitting: Internal Medicine

## 2011-11-21 NOTE — Telephone Encounter (Signed)
Hospital follow up?

## 2011-11-22 ENCOUNTER — Other Ambulatory Visit: Payer: Self-pay | Admitting: Internal Medicine

## 2011-11-22 NOTE — Telephone Encounter (Signed)
Left message asking patient to call back

## 2011-11-23 ENCOUNTER — Other Ambulatory Visit: Payer: Self-pay | Admitting: *Deleted

## 2011-11-23 MED ORDER — LISINOPRIL 40 MG PO TABS: 40.0000 mg | ORAL_TABLET | Freq: Two times a day (BID) | ORAL | Status: DC

## 2011-11-23 NOTE — Telephone Encounter (Signed)
Pt is establishing care with another provider, so will need to get future refills from them

## 2011-11-26 NOTE — Telephone Encounter (Signed)
Patient advised as instructed via telephone, she is doing ok and will see [place] on 11/29/2011.

## 2011-11-29 ENCOUNTER — Encounter: Payer: Self-pay | Admitting: Internal Medicine

## 2011-11-29 ENCOUNTER — Ambulatory Visit (INDEPENDENT_AMBULATORY_CARE_PROVIDER_SITE_OTHER): Payer: Medicare Other | Admitting: Internal Medicine

## 2011-11-29 VITALS — BP 118/78 | HR 53 | Temp 98.7°F | Ht 61.0 in | Wt 175.5 lb

## 2011-11-29 DIAGNOSIS — R05 Cough: Secondary | ICD-10-CM

## 2011-11-29 DIAGNOSIS — I6521 Occlusion and stenosis of right carotid artery: Secondary | ICD-10-CM | POA: Insufficient documentation

## 2011-11-29 DIAGNOSIS — E039 Hypothyroidism, unspecified: Secondary | ICD-10-CM

## 2011-11-29 DIAGNOSIS — E1165 Type 2 diabetes mellitus with hyperglycemia: Secondary | ICD-10-CM | POA: Insufficient documentation

## 2011-11-29 DIAGNOSIS — I10 Essential (primary) hypertension: Secondary | ICD-10-CM | POA: Insufficient documentation

## 2011-11-29 DIAGNOSIS — I6529 Occlusion and stenosis of unspecified carotid artery: Secondary | ICD-10-CM

## 2011-11-29 MED ORDER — INSULIN NPH ISOPHANE & REGULAR (70-30) 100 UNIT/ML ~~LOC~~ SUSP: SUBCUTANEOUS | Status: DC

## 2011-11-29 MED ORDER — LOSARTAN POTASSIUM 50 MG PO TABS: 50.0000 mg | ORAL_TABLET | Freq: Every day | ORAL | Status: DC

## 2011-11-29 NOTE — Assessment & Plan Note (Signed)
Suspect that cough Nordell be secondary to use of ACE inhibitor. Will stop lisinopril and start losartan. Patient will call if symptoms are not improving. If no improvement, would favor a tear treatment with azithromycin given upcoming surgery.

## 2011-11-29 NOTE — Progress Notes (Signed)
Subjective:    Patient ID: Andrea [name], female    DOB: 1946-03-26, 66 y.o.   MRN: [id number]  HPI 66 year old female with history of diabetes, hypertension, hypothyroidism, hyperlipidemia, and vascular disease presents for followup. She was recently admitted with TIA. She was seen by her vascular surgeon today and told that she has significant stenosis of her coronary artery on the right. She was told that she needs carotid endarterectomy. She is already status post carotid endarterectomy on the left.  In regards to her diabetes, she brings record of blood sugars which show most blood sugars between 250 and 300. She reports full compliance with her Novolin 70 3050 units twice daily. She also occasionally uses sliding scale as directed.  She is also concerned today about chronic cough. This is been ongoing for over a month. It is described as an irritating cough with itching in the back of the throat. She has not had any chest pain, shortness of breath, nasal congestion, fever, chills.  Outpatient Encounter Prescriptions as of 11/29/2011  Medication Sig Dispense Refill  . acetaminophen (TYLENOL) 325 MG tablet Take 650 mg by mouth 3 (three) times daily as needed.        . Cholecalciferol (VITAMIN D HIGH POTENCY PO) Take 1 capsule by mouth daily.        Marland Kitchen gabapentin (NEURONTIN) 600 MG tablet Take 600 mg by mouth 2 (two) times daily.      . insulin aspart (NOVOLOG) 100 UNIT/ML injection Inject 3-15 Units into the skin 3 (three) times daily before meals. Sliding scale       . insulin NPH-insulin regular (NOVOLIN 70/30) (70-30) 100 UNIT/ML injection 60 units in AM and 60 units in PM  10 mL  3  . levothyroxine (SYNTHROID, LEVOTHROID) 150 MCG tablet TAKE ONE TABLET BY MOUTH EVERY DAY - REPEAT LABS IN 4 WEEKS  90 tablet  3  . metoprolol (LOPRESSOR) 50 MG tablet Take 50 mg by mouth daily.       . Multiple Vitamin (MULTIVITAMIN) tablet Take 1 tablet by mouth daily.      Marland Kitchen omeprazole (PRILOSEC) 40 MG capsule  Take 1 capsule (40 mg total) by mouth daily.  90 capsule  3  . PARoxetine (PAXIL) 20 MG tablet TAKE ONE TABLET BY MOUTH IN THE MORNING  30 tablet  11  . polyethylene glycol powder (GLYCOLAX/MIRALAX) powder Take 17 g by mouth 3 (three) times daily as needed. For constipation       . simvastatin (ZOCOR) 40 MG tablet Take 40 mg by mouth every evening.      . vitamin B-12 (CYANOCOBALAMIN) 1000 MCG tablet Take 1,000 mcg by mouth daily.      Marland Kitchen DISCONTD: insulin NPH-insulin regular (NOVOLIN 70/30) (70-30) 100 UNIT/ML injection 50 units in AM and 50 units in PM  10 mL  3  . DISCONTD: lisinopril (PRINIVIL,ZESTRIL) 40 MG tablet Take 1 tablet (40 mg total) by mouth 2 (two) times daily.  60 tablet  6  . gabapentin (NEURONTIN) 800 MG tablet Take 600 mg by mouth 2 (two) times daily.      Marland Kitchen losartan (COZAAR) 50 MG tablet Take 1 tablet (50 mg total) by mouth daily.  30 tablet  6   BP 118/78  Pulse 53  Temp 98.7 F (37.1 C) (Oral)  Ht 5\' 1"  (1.549 m)  Wt 175 lb 8 oz (79.606 kg)  BMI 33.16 kg/m2  SpO2 98%  Review of Systems  Constitutional: Negative for fever, chills, appetite change,  fatigue and unexpected weight change.  HENT: Negative for ear pain, congestion, sore throat, trouble swallowing, neck pain, voice change and sinus pressure.   Eyes: Negative for visual disturbance.  Respiratory: Positive for cough. Negative for shortness of breath, wheezing and stridor.   Cardiovascular: Negative for chest pain, palpitations and leg swelling.  Gastrointestinal: Negative for nausea, vomiting, abdominal pain, diarrhea, constipation, blood in stool, abdominal distention and anal bleeding.  Genitourinary: Negative for dysuria and flank pain.  Musculoskeletal: Negative for myalgias, arthralgias and gait problem.  Skin: Negative for color change and rash.  Neurological: Negative for dizziness and headaches.  Hematological: Negative for adenopathy. Does not bruise/bleed easily.  Psychiatric/Behavioral: Negative  for suicidal ideas, disturbed wake/sleep cycle and dysphoric mood. The patient is not nervous/anxious.        Objective:   Physical Exam  Constitutional: She is oriented to person, place, and time. She appears well-developed and well-nourished. No distress.  HENT:  Head: Normocephalic and atraumatic.    Right Ear: External ear normal.  Left Ear: External ear normal.  Nose: Nose normal.  Mouth/Throat: Oropharynx is clear and moist. No oropharyngeal exudate.  Eyes: Conjunctivae are normal. Pupils are equal, round, and reactive to light. Right eye exhibits no discharge. Left eye exhibits no discharge. No scleral icterus.  Neck: Normal range of motion. Neck supple. No tracheal deviation present. No thyromegaly present.  Cardiovascular: Normal rate, regular rhythm, normal heart sounds and intact distal pulses.  Exam reveals no gallop and no friction rub.   No murmur heard. Pulmonary/Chest: Effort normal and breath sounds normal. No respiratory distress. She has no wheezes. She has no rales. She exhibits no tenderness.  Musculoskeletal: Normal range of motion. She exhibits no edema and no tenderness.  Lymphadenopathy:    She has no cervical adenopathy.  Neurological: She is alert and oriented to person, place, and time. No cranial nerve deficit. She exhibits normal muscle tone. Coordination normal.  Skin: Skin is warm and dry. No rash noted. She is not diaphoretic. No erythema. No pallor.  Psychiatric: She has a normal mood and affect. Her behavior is normal. Judgment and thought content normal.          Assessment & Plan:

## 2011-11-29 NOTE — Assessment & Plan Note (Signed)
Suspect that cough is related to use of ACE inhibitor. Will stop lisinopril and start losartan 50 mg daily. Patient will continue to monitor blood pressure and will e-mail or call with blood pressure readings. Followup in one month.

## 2011-11-29 NOTE — Assessment & Plan Note (Signed)
Record of blood sugars today shows that blood sugars are elevated. Will increase Novolin 70/30 to 60 units twice daily. Will check A1c with labs today. Patient will followup in one month.

## 2011-11-29 NOTE — Assessment & Plan Note (Signed)
Per patient, stenosis is severe and warrants surgical intervention. She reports she was seen by her surgeon today. Will get records on evaluation and management.

## 2011-11-29 NOTE — Assessment & Plan Note (Signed)
Will check TSH with labs today. 

## 2011-11-29 NOTE — Patient Instructions (Signed)
Stop Lisinopril. Start Losartan. Call if cough is not improved.

## 2011-11-30 LAB — COMPREHENSIVE METABOLIC PANEL
AST: 42 U/L — ABNORMAL HIGH (ref 0–37)
Alkaline Phosphatase: 201 U/L — ABNORMAL HIGH (ref 39–117)
Glucose, Bld: 157 mg/dL — ABNORMAL HIGH (ref 70–99)
Potassium: 4.9 mEq/L (ref 3.5–5.1)
Sodium: 136 mEq/L (ref 135–145)
Total Bilirubin: 0.2 mg/dL — ABNORMAL LOW (ref 0.3–1.2)
Total Protein: 7.1 g/dL (ref 6.0–8.3)

## 2011-11-30 LAB — TSH: TSH: 1.3 u[IU]/mL (ref 0.35–5.50)

## 2011-11-30 LAB — HEMOGLOBIN A1C: Hgb A1c MFr Bld: 8.5 % — ABNORMAL HIGH (ref 4.6–6.5)

## 2011-12-03 ENCOUNTER — Telehealth: Payer: Self-pay | Admitting: *Deleted

## 2011-12-03 NOTE — Addendum Note (Signed)
Addended by: Harmon Dun on: 12/03/2011 05:34 PM   Modules accepted: Orders

## 2011-12-03 NOTE — Telephone Encounter (Signed)
Patient advised as instructed via telephone, left message on voicemail advising Pattie as instructed.

## 2011-12-03 NOTE — Telephone Encounter (Signed)
Decrease  Both doses to 50 units twice daily and update chart .  Thanks

## 2011-12-03 NOTE — Telephone Encounter (Signed)
Received a call from [name] stating that she has been working with patient because patient has been in and out of the ER here lately.  Patient is taking 60 units of insulin twice daily and last night her blood sugars were 65 & 69, she drank orange juice to bring it back up.  Andrea [name] stated that patient did not know how she adjusted her insulin but wanted the doctor to be aware of what is going on.

## 2011-12-05 ENCOUNTER — Other Ambulatory Visit (INDEPENDENT_AMBULATORY_CARE_PROVIDER_SITE_OTHER): Payer: Medicare Other | Admitting: *Deleted

## 2011-12-05 DIAGNOSIS — R7989 Other specified abnormal findings of blood chemistry: Secondary | ICD-10-CM

## 2011-12-05 LAB — COMPREHENSIVE METABOLIC PANEL
ALT: 51 U/L — ABNORMAL HIGH (ref 0–35)
AST: 53 U/L — ABNORMAL HIGH (ref 0–37)
Albumin: 3.7 g/dL (ref 3.5–5.2)
BUN: 20 mg/dL (ref 6–23)
CO2: 27 mEq/L (ref 19–32)
Calcium: 9.5 mg/dL (ref 8.4–10.5)
Chloride: 97 mEq/L (ref 96–112)
GFR: 68.31 mL/min (ref >60.00)
Potassium: 4.8 mEq/L (ref 3.5–5.1)

## 2011-12-05 NOTE — Addendum Note (Signed)
Addended by: Alois Cliche on: 12/05/2011 11:34 AM   Modules accepted: Orders

## 2011-12-11 ENCOUNTER — Other Ambulatory Visit: Payer: Self-pay | Admitting: *Deleted

## 2011-12-11 MED ORDER — AZITHROMYCIN 250 MG PO TABS: ORAL_TABLET | ORAL | Status: DC

## 2011-12-13 ENCOUNTER — Other Ambulatory Visit (INDEPENDENT_AMBULATORY_CARE_PROVIDER_SITE_OTHER): Payer: Medicare Other | Admitting: *Deleted

## 2011-12-13 DIAGNOSIS — R7989 Other specified abnormal findings of blood chemistry: Secondary | ICD-10-CM

## 2011-12-14 LAB — HEPATITIS C ANTIBODY: HCV Ab: NEGATIVE

## 2011-12-14 LAB — HEPATITIS B SURFACE ANTIBODY,QUALITATIVE: Hep B S Ab: NEGATIVE

## 2011-12-17 ENCOUNTER — Other Ambulatory Visit: Payer: Self-pay | Admitting: *Deleted

## 2011-12-17 MED ORDER — CLONIDINE HCL 0.1 MG PO TABS: 0.1000 mg | ORAL_TABLET | Freq: Two times a day (BID) | ORAL | Status: DC

## 2011-12-17 MED ORDER — CLOPIDOGREL BISULFATE 75 MG PO TABS: 75.0000 mg | ORAL_TABLET | Freq: Every day | ORAL | Status: DC

## 2011-12-19 ENCOUNTER — Telehealth: Payer: Self-pay | Admitting: *Deleted

## 2011-12-19 NOTE — Telephone Encounter (Signed)
Patients spouse called stating that she is still coughing even after a course of antibiotics.  He wants to know what should be done next.  Please advise.

## 2011-12-19 NOTE — Telephone Encounter (Signed)
Acute visit scheduled for 12/20/2011, left message on machine at home advising patient of appt and to call us tomorrow morning to let us know if she can keep appt.

## 2011-12-19 NOTE — Telephone Encounter (Signed)
She needs to be seen in urgent visit.

## 2011-12-20 ENCOUNTER — Ambulatory Visit: Payer: Medicare Other | Admitting: Internal Medicine

## 2011-12-26 ENCOUNTER — Ambulatory Visit: Payer: Self-pay | Admitting: Vascular Surgery

## 2011-12-26 LAB — BASIC METABOLIC PANEL
Creatinine: 0.91 mg/dL (ref 0.60–1.30)
EGFR (African American): >60
EGFR (Non-African Amer.): >60
Glucose: 353 mg/dL — ABNORMAL HIGH (ref 65–99)
Potassium: 4.1 mmol/L (ref 3.5–5.1)
Sodium: 135 mmol/L — ABNORMAL LOW (ref 136–145)

## 2011-12-26 LAB — CBC
HCT: 33.4 % — ABNORMAL LOW (ref 35.0–47.0)
HGB: 11.4 g/dL — ABNORMAL LOW (ref 12.0–16.0)
RBC: 4 10*6/uL (ref 3.80–5.20)

## 2011-12-31 ENCOUNTER — Encounter: Payer: Self-pay | Admitting: Internal Medicine

## 2011-12-31 ENCOUNTER — Telehealth: Payer: Self-pay | Admitting: Internal Medicine

## 2011-12-31 ENCOUNTER — Ambulatory Visit (INDEPENDENT_AMBULATORY_CARE_PROVIDER_SITE_OTHER): Payer: Medicare Other | Admitting: Internal Medicine

## 2011-12-31 ENCOUNTER — Ambulatory Visit (INDEPENDENT_AMBULATORY_CARE_PROVIDER_SITE_OTHER)
Admission: RE | Admit: 2011-12-31 | Discharge: 2011-12-31 | Disposition: A | Discharge status: Home / Self Care | Payer: Medicare Other | Source: Ambulatory Visit | Attending: Internal Medicine | Admitting: Internal Medicine

## 2011-12-31 VITALS — BP 140/90 | HR 60 | Temp 98.3°F | Ht 61.0 in | Wt 173.2 lb

## 2011-12-31 DIAGNOSIS — E039 Hypothyroidism, unspecified: Secondary | ICD-10-CM

## 2011-12-31 DIAGNOSIS — E669 Obesity, unspecified: Secondary | ICD-10-CM

## 2011-12-31 DIAGNOSIS — E1165 Type 2 diabetes mellitus with hyperglycemia: Secondary | ICD-10-CM

## 2011-12-31 DIAGNOSIS — R05 Cough: Secondary | ICD-10-CM

## 2011-12-31 DIAGNOSIS — E119 Type 2 diabetes mellitus without complications: Secondary | ICD-10-CM

## 2011-12-31 DIAGNOSIS — E1169 Type 2 diabetes mellitus with other specified complication: Secondary | ICD-10-CM

## 2011-12-31 LAB — COMPREHENSIVE METABOLIC PANEL
AST: 35 U/L (ref 0–37)
Alkaline Phosphatase: 236 U/L — ABNORMAL HIGH (ref 39–117)
Glucose, Bld: 345 mg/dL — ABNORMAL HIGH (ref 70–99)
Potassium: 4.6 mEq/L (ref 3.5–5.1)
Sodium: 134 mEq/L — ABNORMAL LOW (ref 135–145)
Total Bilirubin: 0.5 mg/dL (ref 0.3–1.2)
Total Protein: 7.5 g/dL (ref 6.0–8.3)

## 2011-12-31 LAB — HEMOGLOBIN A1C: Hgb A1c MFr Bld: 8.4 % — ABNORMAL HIGH (ref 4.6–6.5)

## 2011-12-31 LAB — TSH: TSH: 1.73 u[IU]/mL (ref 0.35–5.50)

## 2011-12-31 NOTE — Telephone Encounter (Signed)
Mariann Laster called checking to see if you filled out the form for [facility] surgical clearance Fax # [phone] Mariann Laster faxed form to you on 8/15

## 2011-12-31 NOTE — Assessment & Plan Note (Signed)
Patient with approximately one-month history of paroxysms of cough which sound typical for pertussis. She was treated with azithromycin with minimal improvement. Exam today is remarkable for left lower lobe crackles. Will get chest x-ray, given that she has scheduled surgery later this week.

## 2011-12-31 NOTE — Progress Notes (Signed)
Subjective:    Patient ID: Andrea [name], female    DOB: 06-14-1945, 66 y.o.   MRN: [id number]  HPI 66 year old female with history of diabetes, hypertension, hypothyroidism, and carotid artery stenosis scheduled for endarterectomy later this week presents for followup. Her vascular surgeon noted blood sugars were elevated and asked for additional clearance. She reports her blood sugars have been typically near 150 over the last few weeks. She has had 2 low blood sugars within the last month which were symptomatic with diaphoresis. These responded to supplementation with oral glucose. She reports full compliance with her insulin. She reports she has been monitoring her diet an effort to improve her blood sugars. Last A1c was 8.5%.  She is concerned today about persistent cough. This cough is nonproductive. There is no associated chest pain, shortness of breath, fever or chills. She reports episodes of cough which are severe but feels well in between episodes. She was treated with azithromycin with no improvement.  Outpatient Encounter Prescriptions as of 12/31/2011  Medication Sig Dispense Refill  . acetaminophen (TYLENOL) 325 MG tablet Take 650 mg by mouth 3 (three) times daily as needed.        Marland Kitchen azithromycin (ZITHROMAX) 250 MG tablet Take two tablets by mouth today, followed by one tablet by mouth on days 2-5  6 each  0  . Cholecalciferol (VITAMIN D HIGH POTENCY PO) Take 1 capsule by mouth daily.        . cloNIDine (CATAPRES) 0.1 MG tablet Take 1 tablet (0.1 mg total) by mouth 2 (two) times daily.  60 tablet  6  . clopidogrel (PLAVIX) 75 MG tablet Take 1 tablet (75 mg total) by mouth daily.  30 tablet  6  . gabapentin (NEURONTIN) 600 MG tablet Take 600 mg by mouth 2 (two) times daily.      . insulin aspart (NOVOLOG) 100 UNIT/ML injection Inject 3-15 Units into the skin 3 (three) times daily before meals. Sliding scale       . insulin NPH-insulin regular (NOVOLIN 70/30) (70-30) 100 UNIT/ML injection  Inject 50 Units into the skin 2 (two) times daily. 60 units in AM and 60 units in PM      . levothyroxine (SYNTHROID, LEVOTHROID) 150 MCG tablet TAKE ONE TABLET BY MOUTH EVERY DAY - REPEAT LABS IN 4 WEEKS  90 tablet  3  . losartan (COZAAR) 50 MG tablet Take 1 tablet (50 mg total) by mouth daily.  30 tablet  6  . metoprolol (LOPRESSOR) 50 MG tablet Take 50 mg by mouth daily.       . Multiple Vitamin (MULTIVITAMIN) tablet Take 1 tablet by mouth daily.      Marland Kitchen omeprazole (PRILOSEC) 40 MG capsule Take 1 capsule (40 mg total) by mouth daily.  90 capsule  3  . PARoxetine (PAXIL) 20 MG tablet TAKE ONE TABLET BY MOUTH IN THE MORNING  30 tablet  11  . polyethylene glycol powder (GLYCOLAX/MIRALAX) powder Take 17 g by mouth 3 (three) times daily as needed. For constipation       . simvastatin (ZOCOR) 40 MG tablet Take 40 mg by mouth every evening.      . vitamin B-12 (CYANOCOBALAMIN) 1000 MCG tablet Take 1,000 mcg by mouth daily.        Review of Systems  Constitutional: Negative for fever, chills, appetite change, fatigue and unexpected weight change.  HENT: Negative for ear pain, congestion, sore throat, trouble swallowing, neck pain, voice change and sinus pressure.   Eyes: Negative  for visual disturbance.  Respiratory: Positive for cough. Negative for shortness of breath, wheezing and stridor.   Cardiovascular: Negative for chest pain, palpitations and leg swelling.  Gastrointestinal: Negative for nausea, vomiting, abdominal pain, diarrhea, constipation, blood in stool, abdominal distention and anal bleeding.  Genitourinary: Negative for dysuria and flank pain.  Musculoskeletal: Negative for myalgias, arthralgias and gait problem.  Skin: Negative for color change and rash.  Neurological: Negative for dizziness and headaches.  Hematological: Negative for adenopathy. Does not bruise/bleed easily.  Psychiatric/Behavioral: Negative for suicidal ideas, disturbed wake/sleep cycle and dysphoric mood. The  patient is not nervous/anxious.        Objective:   Physical Exam  Constitutional: She is oriented to person, place, and time. She appears well-developed and well-nourished. No distress.  HENT:  Head: Normocephalic and atraumatic.  Right Ear: External ear normal.  Left Ear: External ear normal.  Nose: Nose normal.  Mouth/Throat: Oropharynx is clear and moist. No oropharyngeal exudate.  Eyes: Conjunctivae are normal. Pupils are equal, round, and reactive to light. Right eye exhibits no discharge. Left eye exhibits no discharge. No scleral icterus.  Neck: Normal range of motion. Neck supple. No tracheal deviation present. No thyromegaly present.  Cardiovascular: Normal rate, regular rhythm, normal heart sounds and intact distal pulses.  Exam reveals no gallop and no friction rub.   No murmur heard. Pulmonary/Chest: Effort normal. No accessory muscle usage. Not tachypneic. No respiratory distress. She has no decreased breath sounds. She has no wheezes. She has rhonchi in the left lower field. She has no rales. She exhibits no tenderness.  Musculoskeletal: Normal range of motion. She exhibits no edema and no tenderness.  Lymphadenopathy:    She has no cervical adenopathy.  Neurological: She is alert and oriented to person, place, and time. No cranial nerve deficit. She exhibits normal muscle tone. Coordination normal.  Skin: Skin is warm and dry. No rash noted. She is not diaphoretic. No erythema. No pallor.  Psychiatric: She has a normal mood and affect. Her behavior is normal. Judgment and thought content normal.          Assessment & Plan:

## 2011-12-31 NOTE — Assessment & Plan Note (Signed)
Patient reports improved blood sugar control. Will check A1c with labs today. Continue current medications. Followup one month.

## 2011-12-31 NOTE — Telephone Encounter (Signed)
Dr. Gilford Rile stated that she has ordered lab work on patient and is waiting for the results of those labs before we can clear her or not.  Mariann Laster advised as instructed via telephone.

## 2012-01-01 ENCOUNTER — Telehealth: Payer: Self-pay | Admitting: Internal Medicine

## 2012-01-01 ENCOUNTER — Encounter: Payer: Self-pay | Admitting: Internal Medicine

## 2012-01-01 NOTE — Telephone Encounter (Signed)
Surgical clearance form faxed to [facility] at [phone].

## 2012-01-01 NOTE — Telephone Encounter (Signed)
Fax over medical clearance for [facility] to [place] fax # [phone]. Before tomorrow.

## 2012-01-02 ENCOUNTER — Inpatient Hospital Stay: Payer: Self-pay | Admitting: Vascular Surgery

## 2012-01-03 LAB — CBC WITH DIFFERENTIAL/PLATELET
Basophil #: 0.1 x10 3/mm 3
Basophil %: 0.7 %
Eosinophil #: 0.6 x10 3/mm 3
Eosinophil %: 6 %
HCT: 31.5 % — ABNORMAL LOW
HGB: 10.9 g/dL — ABNORMAL LOW
Lymphocyte %: 22.2 %
Lymphs Abs: 2.1 x10 3/mm 3
MCH: 28.7 pg
MCHC: 34.6 g/dL
MCV: 83 fL
Monocyte #: 0.6 "x10 3/mm "
Monocyte %: 6.1 %
Neutrophil #: 6.2 x10 3/mm 3
Neutrophil %: 65 %
Platelet: 171 x10 3/mm 3
RBC: 3.8 X10 6/mm 3
RDW: 14.3 %
WBC: 9.5 x10 3/mm 3

## 2012-01-03 LAB — BASIC METABOLIC PANEL WITH GFR
Anion Gap: 6 — ABNORMAL LOW
BUN: 16 mg/dL
Calcium, Total: 8.7 mg/dL
Chloride: 103 mmol/L
Co2: 29 mmol/L
Creatinine: 0.81 mg/dL
EGFR (African American): >60
EGFR (Non-African Amer.): >60
Glucose: 270 mg/dL — ABNORMAL HIGH
Osmolality: 286
Potassium: 4.1 mmol/L
Sodium: 138 mmol/L

## 2012-01-03 LAB — APTT: Activated PTT: 31.7 s

## 2012-01-04 ENCOUNTER — Inpatient Hospital Stay: Payer: Self-pay | Admitting: Internal Medicine

## 2012-01-04 LAB — URINALYSIS, COMPLETE
Bilirubin,UR: NEGATIVE
Blood: NEGATIVE
Ketone: NEGATIVE
Nitrite: NEGATIVE
Ph: 5 (ref 4.5–8.0)
Protein: NEGATIVE
RBC,UR: 1 /HPF (ref 0–5)
Squamous Epithelial: 1
Transitional Epi: <1

## 2012-01-04 LAB — COMPREHENSIVE METABOLIC PANEL
Anion Gap: 6 — ABNORMAL LOW (ref 7–16)
Bilirubin,Total: 0.3 mg/dL (ref 0.2–1.0)
Chloride: 103 mmol/L (ref 98–107)
Co2: 32 mmol/L (ref 21–32)
EGFR (African American): >60
EGFR (Non-African Amer.): >60
Glucose: 87 mg/dL (ref 65–99)
SGOT(AST): 33 U/L (ref 15–37)
SGPT (ALT): 27 U/L (ref 12–78)
Sodium: 141 mmol/L (ref 136–145)

## 2012-01-04 LAB — CBC
HCT: 35.8 % (ref 35.0–47.0)
MCH: 27.1 pg (ref 26.0–34.0)
MCHC: 32.7 g/dL (ref 32.0–36.0)
MCV: 83 fL (ref 80–100)
Platelet: 189 10*3/uL (ref 150–440)
RBC: 4.32 10*6/uL (ref 3.80–5.20)
RDW: 14.4 % (ref 11.5–14.5)
WBC: 9 10*3/uL (ref 3.6–11.0)

## 2012-01-04 LAB — CK TOTAL AND CKMB (NOT AT ARMC)
CK, Total: 35 U/L (ref 21–215)
CK-MB: 0.6 ng/mL (ref 0.5–3.6)

## 2012-01-04 LAB — PATHOLOGY REPORT

## 2012-01-05 LAB — CBC WITH DIFFERENTIAL/PLATELET
Basophil #: 0 10*3/uL (ref 0.0–0.1)
Eosinophil #: 0.3 10*3/uL (ref 0.0–0.7)
HGB: 9.7 g/dL — ABNORMAL LOW (ref 12.0–16.0)
Lymphocyte %: 18.7 %
MCH: 28 pg (ref 26.0–34.0)
MCHC: 33.8 g/dL (ref 32.0–36.0)
Monocyte #: 0.9 x10 3/mm (ref 0.2–0.9)
Neutrophil %: 70.5 %
Platelet: 158 10*3/uL (ref 150–440)
RBC: 3.47 10*6/uL — ABNORMAL LOW (ref 3.80–5.20)
RDW: 14.4 % (ref 11.5–14.5)
WBC: 10.9 10*3/uL (ref 3.6–11.0)

## 2012-01-07 DIAGNOSIS — I059 Rheumatic mitral valve disease, unspecified: Secondary | ICD-10-CM

## 2012-01-07 LAB — BASIC METABOLIC PANEL
Anion Gap: 4 — ABNORMAL LOW (ref 7–16)
BUN: 15 mg/dL (ref 7–18)
Creatinine: 0.89 mg/dL (ref 0.60–1.30)
Glucose: 111 mg/dL — ABNORMAL HIGH (ref 65–99)
Sodium: 142 mmol/L (ref 136–145)

## 2012-01-07 LAB — EXPECTORATED SPUTUM ASSESSMENT W GRAM STAIN, RFLX TO RESP C

## 2012-01-07 LAB — TROPONIN I: Troponin-I: <0.02 ng/mL

## 2012-01-08 ENCOUNTER — Telehealth: Payer: Self-pay | Admitting: Internal Medicine

## 2012-01-08 NOTE — Telephone Encounter (Signed)
Labs that we sent to look for pertussis were positive (whooping cough).  This was the likely cause of her persistent intermittent cough.

## 2012-01-08 NOTE — Telephone Encounter (Signed)
Patient notified

## 2012-01-10 LAB — CULTURE, BLOOD (SINGLE)

## 2012-01-11 ENCOUNTER — Encounter: Payer: Self-pay | Admitting: Internal Medicine

## 2012-01-11 ENCOUNTER — Ambulatory Visit (INDEPENDENT_AMBULATORY_CARE_PROVIDER_SITE_OTHER): Payer: Medicare Other | Admitting: Internal Medicine

## 2012-01-11 VITALS — BP 130/80 | HR 58 | Temp 98.2°F | Ht 61.0 in | Wt 174.0 lb

## 2012-01-11 DIAGNOSIS — R682 Dry mouth, unspecified: Secondary | ICD-10-CM

## 2012-01-11 DIAGNOSIS — R531 Weakness: Secondary | ICD-10-CM

## 2012-01-11 DIAGNOSIS — I6521 Occlusion and stenosis of right carotid artery: Secondary | ICD-10-CM

## 2012-01-11 DIAGNOSIS — E1165 Type 2 diabetes mellitus with hyperglycemia: Secondary | ICD-10-CM

## 2012-01-11 DIAGNOSIS — I6529 Occlusion and stenosis of unspecified carotid artery: Secondary | ICD-10-CM

## 2012-01-11 DIAGNOSIS — R5381 Other malaise: Secondary | ICD-10-CM

## 2012-01-11 DIAGNOSIS — Z79899 Other long term (current) drug therapy: Secondary | ICD-10-CM

## 2012-01-11 DIAGNOSIS — R5383 Other fatigue: Secondary | ICD-10-CM

## 2012-01-11 DIAGNOSIS — R9389 Abnormal findings on diagnostic imaging of other specified body structures: Secondary | ICD-10-CM

## 2012-01-11 DIAGNOSIS — G629 Polyneuropathy, unspecified: Secondary | ICD-10-CM

## 2012-01-11 DIAGNOSIS — J690 Pneumonitis due to inhalation of food and vomit: Secondary | ICD-10-CM

## 2012-01-11 DIAGNOSIS — K117 Disturbances of salivary secretion: Secondary | ICD-10-CM

## 2012-01-11 DIAGNOSIS — A3700 Whooping cough due to Bordetella pertussis without pneumonia: Secondary | ICD-10-CM

## 2012-01-11 DIAGNOSIS — G589 Mononeuropathy, unspecified: Secondary | ICD-10-CM

## 2012-01-11 LAB — CBC WITH DIFFERENTIAL/PLATELET
Basophils Relative: 0.7 % (ref 0.0–3.0)
Eosinophils Relative: 5.1 % — ABNORMAL HIGH (ref 0.0–5.0)
HCT: 30 % — ABNORMAL LOW (ref 36.0–46.0)
Hemoglobin: 9.9 g/dL — ABNORMAL LOW (ref 12.0–15.0)
Lymphocytes Relative: 32.5 % (ref 12.0–46.0)
Lymphs Abs: 2.7 10*3/uL (ref 0.7–4.0)
Monocytes Relative: 6.6 % (ref 3.0–12.0)
Neutro Abs: 4.6 10*3/uL (ref 1.4–7.7)
RBC: 3.6 Mil/uL — ABNORMAL LOW (ref 3.87–5.11)
RDW: 14.8 % — ABNORMAL HIGH (ref 11.5–14.6)
WBC: 8.4 10*3/uL (ref 4.5–10.5)

## 2012-01-11 LAB — COMPREHENSIVE METABOLIC PANEL
ALT: 22 U/L (ref 0–35)
BUN: 18 mg/dL (ref 6–23)
CO2: 28 mEq/L (ref 19–32)
Creatinine, Ser: 0.9 mg/dL (ref 0.4–1.2)
GFR: 63.28 mL/min (ref >60.00)
Total Bilirubin: 0.3 mg/dL (ref 0.3–1.2)

## 2012-01-11 MED ORDER — ARTIFICIAL SALIVA MT SOLN: 1.0000 "application " | OROMUCOSAL | Status: DC | PRN

## 2012-01-11 MED ORDER — GABAPENTIN 600 MG PO TABS: 600.0000 mg | ORAL_TABLET | Freq: Two times a day (BID) | ORAL | Status: DC

## 2012-01-11 NOTE — Patient Instructions (Signed)
Labs today and chest xray on Tuesday  Follow up 2 weeks.

## 2012-01-12 DIAGNOSIS — G629 Polyneuropathy, unspecified: Secondary | ICD-10-CM | POA: Insufficient documentation

## 2012-01-12 DIAGNOSIS — R682 Dry mouth, unspecified: Secondary | ICD-10-CM | POA: Insufficient documentation

## 2012-01-12 DIAGNOSIS — A3700 Whooping cough due to Bordetella pertussis without pneumonia: Secondary | ICD-10-CM | POA: Insufficient documentation

## 2012-01-12 DIAGNOSIS — R531 Weakness: Secondary | ICD-10-CM | POA: Insufficient documentation

## 2012-01-12 DIAGNOSIS — R9389 Abnormal findings on diagnostic imaging of other specified body structures: Secondary | ICD-10-CM | POA: Insufficient documentation

## 2012-01-12 DIAGNOSIS — J690 Pneumonitis due to inhalation of food and vomit: Secondary | ICD-10-CM | POA: Insufficient documentation

## 2012-01-12 LAB — VITAMIN D 25 HYDROXY (VIT D DEFICIENCY, FRACTURES): Vit D, 25-Hydroxy: 52 ng/mL (ref 30–89)

## 2012-01-12 NOTE — Assessment & Plan Note (Signed)
S/p right carotid endarterectomy. Doing well postoperatively. Will continue to monitor.

## 2012-01-12 NOTE — Assessment & Plan Note (Signed)
Secondary to DM. Symptoms well controlled with neurontin. Will continue.

## 2012-01-12 NOTE — Assessment & Plan Note (Signed)
S/p recent admission for aspiration pneumonia. Doing well. Continues on Levaquin. Will complete course this weekend. Will get follow up CXR next week. RTC 2 weeks and prn.

## 2012-01-12 NOTE — Assessment & Plan Note (Signed)
BG elevated after recent hospitalization. For now, will continue to monitor and will have pt keep record of BG for next visit in 2 weeks.

## 2012-01-12 NOTE — Progress Notes (Signed)
Subjective:    Patient ID: Andrea [name], female    DOB: July 26, 1945, 66 y.o.   MRN: [id number]  HPI 66YO female with DM, carotid stenosis s/p right carotid endarterectomy complicated by aspiration pneumonia presents for hospital follow up. Doing well. No fever, chills, dyspnea. Cough improving. Continues to complain of fatigue/weakness after hospital discharge. Continues on antibiotics. BG have been slightly higher than normal, typically 150-250 since discharge. Reports full compliance with medications. No focal neurologic symptoms, headache. Mild neck pain at surgical site.  Outpatient Encounter Prescriptions as of 01/11/2012  Medication Sig Dispense Refill  . amLODipine (NORVASC) 10 MG tablet Take 10 mg by mouth daily.      Marland Kitchen aspirin 81 MG tablet Take 81 mg by mouth daily.      . Cholecalciferol (VITAMIN D HIGH POTENCY PO) Take 1 capsule by mouth daily.        . cloNIDine (CATAPRES) 0.1 MG tablet Take 1 tablet (0.1 mg total) by mouth 2 (two) times daily.  60 tablet  6  . clopidogrel (PLAVIX) 75 MG tablet Take 1 tablet (75 mg total) by mouth daily.  30 tablet  6  . docusate sodium (COLACE) 100 MG capsule Take 100 mg by mouth 2 (two) times daily.      Marland Kitchen gabapentin (NEURONTIN) 600 MG tablet Take 1 tablet (600 mg total) by mouth 2 (two) times daily.  180 tablet  3  . insulin aspart (NOVOLOG) 100 UNIT/ML injection Inject 3-15 Units into the skin 3 (three) times daily before meals. Sliding scale       . insulin NPH-insulin regular (NOVOLIN 70/30) (70-30) 100 UNIT/ML injection Inject 60 Units into the skin 2 (two) times daily. 60 units in AM and 60 units in PM      . levofloxacin (LEVAQUIN) 250 MG tablet Take 250 mg by mouth daily.      Marland Kitchen levothyroxine (SYNTHROID, LEVOTHROID) 150 MCG tablet TAKE ONE TABLET BY MOUTH EVERY DAY - REPEAT LABS IN 4 WEEKS  90 tablet  3  . losartan (COZAAR) 50 MG tablet Take 1 tablet (50 mg total) by mouth daily.  30 tablet  6  . metoprolol (LOPRESSOR) 50 MG tablet Take 50 mg  by mouth 2 (two) times daily.       . Multiple Vitamin (MULTIVITAMIN) tablet Take 1 tablet by mouth daily.      Marland Kitchen omeprazole (PRILOSEC) 40 MG capsule Take 1 capsule (40 mg total) by mouth daily.  90 capsule  3  . PARoxetine (PAXIL) 20 MG tablet TAKE ONE TABLET BY MOUTH IN THE MORNING  30 tablet  11  . polyethylene glycol powder (GLYCOLAX/MIRALAX) powder Take 17 g by mouth 3 (three) times daily as needed. For constipation       . simvastatin (ZOCOR) 40 MG tablet Take 40 mg by mouth every evening.      . vitamin B-12 (CYANOCOBALAMIN) 1000 MCG tablet Take 1,000 mcg by mouth daily.      Marland Kitchen DISCONTD: gabapentin (NEURONTIN) 600 MG tablet Take 600 mg by mouth 2 (two) times daily.      Marland Kitchen acetaminophen (TYLENOL) 325 MG tablet Take 650 mg by mouth 3 (three) times daily as needed.        . Artificial Saliva SOLN Use as directed 1 application in the mouth or throat as needed.  1 Bottle  0  . DISCONTD: azithromycin (ZITHROMAX) 250 MG tablet Take two tablets by mouth today, followed by one tablet by mouth on days 2-5  6 each  0   BP 130/80  Pulse 58  Temp 98.2 F (36.8 C) (Oral)  Ht 5\' 1"  (1.549 m)  Wt 174 lb (78.926 kg)  BMI 32.88 kg/m2  SpO2 95%  Review of Systems  Constitutional: Negative for fever, chills, appetite change, fatigue and unexpected weight change.  HENT: Negative for ear pain, congestion, sore throat, trouble swallowing, neck pain, voice change and sinus pressure.   Eyes: Negative for visual disturbance.  Respiratory: Negative for cough, shortness of breath, wheezing and stridor.   Cardiovascular: Negative for chest pain, palpitations and leg swelling.  Gastrointestinal: Negative for nausea, vomiting, abdominal pain, diarrhea, constipation, blood in stool, abdominal distention and anal bleeding.  Genitourinary: Negative for dysuria and flank pain.  Musculoskeletal: Negative for myalgias, arthralgias and gait problem.  Skin: Negative for color change and rash.  Neurological: Positive  for weakness. Negative for dizziness, speech difficulty, light-headedness, numbness and headaches.  Hematological: Negative for adenopathy. Does not bruise/bleed easily.  Psychiatric/Behavioral: Negative for suicidal ideas, disturbed wake/sleep cycle and dysphoric mood. The patient is not nervous/anxious.        Objective:   Physical Exam  Constitutional: She is oriented to person, place, and time. She appears well-developed and well-nourished. No distress.  HENT:  Head: Normocephalic and atraumatic.  Right Ear: External ear normal.  Left Ear: External ear normal.  Nose: Nose normal.  Mouth/Throat: Oropharynx is clear and moist. No oropharyngeal exudate.  Eyes: Conjunctivae are normal. Pupils are equal, round, and reactive to light. Right eye exhibits no discharge. Left eye exhibits no discharge. No scleral icterus.  Neck: Normal range of motion. Neck supple. No tracheal deviation present. No thyromegaly present.    Cardiovascular: Normal rate, regular rhythm, normal heart sounds and intact distal pulses.  Exam reveals no gallop and no friction rub.   No murmur heard. Pulmonary/Chest: Effort normal. No accessory muscle usage. Not tachypneic. No respiratory distress. She has no decreased breath sounds. She has no wheezes. She has rhonchi in the left lower field. She has no rales. She exhibits no tenderness.  Musculoskeletal: Normal range of motion. She exhibits no edema and no tenderness.  Lymphadenopathy:    She has no cervical adenopathy.  Neurological: She is alert and oriented to person, place, and time. No cranial nerve deficit. She exhibits normal muscle tone. Coordination normal.  Skin: Skin is warm and dry. No rash noted. She is not diaphoretic. No erythema. No pallor.  Psychiatric: She has a normal mood and affect. Her behavior is normal. Judgment and thought content normal.          Assessment & Plan:

## 2012-01-12 NOTE — Assessment & Plan Note (Signed)
Esophageal thickening versus LAD noted. Will plan to set up GI evaluation for EGD and direct visualization.

## 2012-01-12 NOTE — Assessment & Plan Note (Signed)
Likely secondary to medications. Will try adding artifical saliva. Follow up prn.

## 2012-01-12 NOTE — Assessment & Plan Note (Signed)
Secondary to deconditioning after multiple hospitalizations and chronic illness. Pt declines referral for PT. Encouraged her to continue with exercises at home. Follow up 2 weeks.

## 2012-01-12 NOTE — Assessment & Plan Note (Signed)
Pt with several month h/o cough. Was found to have IgM pos for pertussis. Treated with azithromycin. Symptomatically improved. Will monitor.

## 2012-01-15 ENCOUNTER — Telehealth: Payer: Self-pay | Admitting: Internal Medicine

## 2012-01-15 NOTE — Telephone Encounter (Signed)
That is fine 

## 2012-01-15 NOTE — Telephone Encounter (Signed)
Caller: Patti/Patient; Phone: [phone]; Reason for Call: Please call [facility] w/[facility]; she would like to discuss getting orders for nurse care once or twice per week for next month or so.  Pt blood sugar running high, she doesn't feel pt or husband is able to determine what might be emergent symptoms and would like order for home health care.

## 2012-01-15 NOTE — Telephone Encounter (Signed)
Spoke with Precious Bard via telephone and she gave me the fax number to [facility] [phone] where order needs to be sent.  Order faxed today.

## 2012-01-16 ENCOUNTER — Encounter: Payer: Self-pay | Admitting: Internal Medicine

## 2012-01-18 ENCOUNTER — Telehealth: Payer: Self-pay | Admitting: Internal Medicine

## 2012-01-18 NOTE — Telephone Encounter (Signed)
Patient scheduled with Dr. [facility] on 10.22.13 @ 2:00. Patient is aware of this appointment.

## 2012-01-22 ENCOUNTER — Telehealth: Payer: Self-pay | Admitting: *Deleted

## 2012-01-22 ENCOUNTER — Other Ambulatory Visit: Payer: Self-pay | Admitting: *Deleted

## 2012-01-22 MED ORDER — AMLODIPINE BESYLATE 10 MG PO TABS: 10.0000 mg | ORAL_TABLET | Freq: Every day | ORAL | Status: DC

## 2012-01-22 NOTE — Telephone Encounter (Signed)
Sara notified.

## 2012-01-22 NOTE — Telephone Encounter (Signed)
That is fine 

## 2012-01-22 NOTE — Telephone Encounter (Signed)
Patient was admitted to have [facility] services.  Andrea [name] thinks that patient would benefit from speech therapy and physical therapy for swallowing issues.  A verbal order is needed and can be called to [facility].  Please advise.

## 2012-01-24 ENCOUNTER — Telehealth: Payer: Self-pay | Admitting: *Deleted

## 2012-01-24 NOTE — Telephone Encounter (Signed)
Manuela Schwartz called to let Dr. Gilford Rile know that she saw patient on yesterday and she is doing better since on the iron supplement.  She doesn't want PT, her husband is very supportive and will assist her in walking.  Manuela Schwartz stated that she made recommendations to improve safety in the home.

## 2012-01-25 ENCOUNTER — Telehealth: Payer: Self-pay | Admitting: *Deleted

## 2012-01-25 NOTE — Telephone Encounter (Signed)
Andrea [name] called to let Dr. Gilford Rile know that patient is non compliant with her diabetic diet.  She is refusing services from the home health nurses.  They will make another attempt to see her again.

## 2012-01-28 ENCOUNTER — Ambulatory Visit (INDEPENDENT_AMBULATORY_CARE_PROVIDER_SITE_OTHER)
Admission: RE | Admit: 2012-01-28 | Discharge: 2012-01-28 | Disposition: A | Discharge status: Home / Self Care | Payer: Medicare Other | Source: Ambulatory Visit | Attending: Internal Medicine | Admitting: Internal Medicine

## 2012-01-28 ENCOUNTER — Ambulatory Visit (INDEPENDENT_AMBULATORY_CARE_PROVIDER_SITE_OTHER): Payer: Medicare Other | Admitting: Internal Medicine

## 2012-01-28 ENCOUNTER — Encounter: Payer: Self-pay | Admitting: Internal Medicine

## 2012-01-28 VITALS — BP 130/82 | HR 65 | Temp 98.0°F | Ht 61.5 in | Wt 153.5 lb

## 2012-01-28 DIAGNOSIS — E785 Hyperlipidemia, unspecified: Secondary | ICD-10-CM

## 2012-01-28 DIAGNOSIS — R05 Cough: Secondary | ICD-10-CM

## 2012-01-28 DIAGNOSIS — I10 Essential (primary) hypertension: Secondary | ICD-10-CM

## 2012-01-28 DIAGNOSIS — R059 Cough, unspecified: Secondary | ICD-10-CM

## 2012-01-28 DIAGNOSIS — Z23 Encounter for immunization: Secondary | ICD-10-CM

## 2012-01-28 DIAGNOSIS — IMO0001 Reserved for inherently not codable concepts without codable children: Secondary | ICD-10-CM

## 2012-01-28 DIAGNOSIS — J690 Pneumonitis due to inhalation of food and vomit: Secondary | ICD-10-CM

## 2012-01-28 DIAGNOSIS — E1165 Type 2 diabetes mellitus with hyperglycemia: Secondary | ICD-10-CM

## 2012-01-28 MED ORDER — SIMVASTATIN 40 MG PO TABS: 40.0000 mg | ORAL_TABLET | Freq: Every evening | ORAL | Status: DC

## 2012-01-28 NOTE — Assessment & Plan Note (Signed)
Symptomatically improving after recent hospitalization for aspiration pneumonia. Chest x-ray normal today. Still having some coughing after eating. Patient is not following instructions to thicken liquids. She discontinued speech and occupational therapy. Strongly encourage better compliance with thickening of liquids.

## 2012-01-28 NOTE — Assessment & Plan Note (Signed)
Blood pressure well-controlled today. We'll continue current medications.

## 2012-01-28 NOTE — Assessment & Plan Note (Signed)
Blood sugars improved with increased dose of Lantus at 60 units twice daily. We'll continue to monitor. We'll plan to recheck A1c next month. Note that home health nurse reports that patient is poorly compliant with diet. Strongly encouraged better compliance.

## 2012-01-28 NOTE — Progress Notes (Signed)
Subjective:    Patient ID: Andrea [name], female    DOB: 1945-10-07, 66 y.o.   MRN: [id number]  HPI 66 year old female with history of hypothyroidism, diabetes, hypertension, and recent admission for aspiration pneumonia presents for followup. She reports she is generally doing well. Her energy level is improving. She continues to have cough especially after eating. Cough is nonproductive. She has not been thickening her liquid intake as suggested by speech therapy during her hospitalization. Home health nurse reports she has not been compliant with diet. She ultimately discontinued all home health services. She brings a record of blood sugars today which show most blood sugars between 150 and 250. This is an improvement for her. She reports she has increased her Lantus to 60 units twice daily. She reports compliance with this. She denies any recent fever, chills, chest pain.  Outpatient Encounter Prescriptions as of 01/28/2012  Medication Sig Dispense Refill  . acetaminophen (TYLENOL) 325 MG tablet Take 650 mg by mouth 3 (three) times daily as needed.        Marland Kitchen amLODipine (NORVASC) 10 MG tablet Take 1 tablet (10 mg total) by mouth daily.  30 tablet  11  . Artificial Saliva SOLN Use as directed 1 application in the mouth or throat as needed.  1 Bottle  0  . aspirin 81 MG tablet Take 81 mg by mouth daily.      . Cholecalciferol (VITAMIN D HIGH POTENCY PO) Take 1 capsule by mouth daily.        . cloNIDine (CATAPRES) 0.1 MG tablet Take 1 tablet (0.1 mg total) by mouth 2 (two) times daily.  60 tablet  6  . clopidogrel (PLAVIX) 75 MG tablet Take 1 tablet (75 mg total) by mouth daily.  30 tablet  6  . docusate sodium (COLACE) 100 MG capsule Take 100 mg by mouth 2 (two) times daily.      Marland Kitchen gabapentin (NEURONTIN) 600 MG tablet Take 1 tablet (600 mg total) by mouth 2 (two) times daily.  180 tablet  3  . insulin aspart (NOVOLOG) 100 UNIT/ML injection Inject 3-15 Units into the skin 3 (three) times daily before  meals. Sliding scale       . insulin NPH-insulin regular (NOVOLIN 70/30) (70-30) 100 UNIT/ML injection Inject 60 Units into the skin 2 (two) times daily. 60 units in AM and 60 units in PM      . levothyroxine (SYNTHROID, LEVOTHROID) 150 MCG tablet TAKE ONE TABLET BY MOUTH EVERY DAY - REPEAT LABS IN 4 WEEKS  90 tablet  3  . losartan (COZAAR) 50 MG tablet Take 1 tablet (50 mg total) by mouth daily.  30 tablet  6  . metoprolol (LOPRESSOR) 50 MG tablet Take 50 mg by mouth 2 (two) times daily.       . Multiple Vitamin (MULTIVITAMIN) tablet Take 1 tablet by mouth daily.      Marland Kitchen omeprazole (PRILOSEC) 40 MG capsule Take 1 capsule (40 mg total) by mouth daily.  90 capsule  3  . PARoxetine (PAXIL) 20 MG tablet TAKE ONE TABLET BY MOUTH IN THE MORNING  30 tablet  11  . polyethylene glycol powder (GLYCOLAX/MIRALAX) powder Take 17 g by mouth 3 (three) times daily as needed. For constipation       . simvastatin (ZOCOR) 40 MG tablet Take 1 tablet (40 mg total) by mouth every evening.  30 tablet  6  . vitamin B-12 (CYANOCOBALAMIN) 1000 MCG tablet Take 1,000 mcg by mouth daily.      Marland Kitchen  DISCONTD: simvastatin (ZOCOR) 40 MG tablet Take 40 mg by mouth every evening.       BP 130/82  Pulse 65  Temp 98 F (36.7 C) (Oral)  Ht 5' 1.5" (1.562 m)  Wt 153 lb 8 oz (69.627 kg)  BMI 28.53 kg/m2  SpO2 93%  Review of Systems  Constitutional: Positive for fatigue. Negative for fever, chills, appetite change and unexpected weight change.  HENT: Negative for ear pain, congestion, sore throat, trouble swallowing, neck pain, voice change and sinus pressure.   Eyes: Negative for visual disturbance.  Respiratory: Negative for cough, shortness of breath, wheezing and stridor.   Cardiovascular: Negative for chest pain, palpitations and leg swelling.  Gastrointestinal: Negative for nausea, vomiting, abdominal pain, diarrhea, constipation, blood in stool, abdominal distention and anal bleeding.  Genitourinary: Negative for dysuria  and flank pain.  Musculoskeletal: Negative for myalgias, arthralgias and gait problem.  Skin: Negative for color change and rash.  Neurological: Negative for dizziness and headaches.  Hematological: Negative for adenopathy. Does not bruise/bleed easily.  Psychiatric/Behavioral: Negative for suicidal ideas, disturbed wake/sleep cycle and dysphoric mood. The patient is not nervous/anxious.        Objective:   Physical Exam  Constitutional: She is oriented to person, place, and time. She appears well-developed and well-nourished. No distress.  HENT:  Head: Normocephalic and atraumatic.  Right Ear: External ear normal.  Left Ear: External ear normal.  Nose: Nose normal.  Mouth/Throat: Oropharynx is clear and moist. No oropharyngeal exudate.  Eyes: Conjunctivae normal are normal. Pupils are equal, round, and reactive to light. Right eye exhibits no discharge. Left eye exhibits no discharge. No scleral icterus.  Neck: Normal range of motion. Neck supple. No tracheal deviation present. No thyromegaly present.  Cardiovascular: Normal rate, regular rhythm, normal heart sounds and intact distal pulses.  Exam reveals no gallop and no friction rub.   No murmur heard. Pulmonary/Chest: Effort normal and breath sounds normal. No respiratory distress. She has no wheezes. She has no rales. She exhibits no tenderness.  Musculoskeletal: Normal range of motion. She exhibits no edema and no tenderness.  Lymphadenopathy:    She has no cervical adenopathy.  Neurological: She is alert and oriented to person, place, and time. No cranial nerve deficit. She exhibits normal muscle tone. Coordination normal.  Skin: Skin is warm and dry. No rash noted. She is not diaphoretic. No erythema. No pallor.  Psychiatric: She has a normal mood and affect. Her behavior is normal. Judgment and thought content normal.          Assessment & Plan:

## 2012-01-28 NOTE — Assessment & Plan Note (Signed)
Strongly encouraged patient to have better compliance with thickening of liquids. Suspect cough is related to aspiration. Followup one month.

## 2012-02-15 DIAGNOSIS — Z794 Long term (current) use of insulin: Secondary | ICD-10-CM

## 2012-02-15 DIAGNOSIS — Z7902 Long term (current) use of antithrombotics/antiplatelets: Secondary | ICD-10-CM

## 2012-02-15 DIAGNOSIS — E119 Type 2 diabetes mellitus without complications: Secondary | ICD-10-CM

## 2012-02-15 DIAGNOSIS — I10 Essential (primary) hypertension: Secondary | ICD-10-CM

## 2012-02-15 DIAGNOSIS — Z9181 History of falling: Secondary | ICD-10-CM

## 2012-03-03 ENCOUNTER — Ambulatory Visit: Payer: Medicare Other | Admitting: Internal Medicine

## 2012-03-03 DIAGNOSIS — Z0289 Encounter for other administrative examinations: Secondary | ICD-10-CM

## 2012-03-07 ENCOUNTER — Telehealth: Payer: Self-pay | Admitting: General Practice

## 2012-03-07 NOTE — Telephone Encounter (Signed)
Opened in error

## 2012-03-22 ENCOUNTER — Other Ambulatory Visit: Payer: Self-pay | Admitting: Internal Medicine

## 2012-04-02 ENCOUNTER — Telehealth: Payer: Self-pay | Admitting: *Deleted

## 2012-04-02 ENCOUNTER — Encounter: Payer: Self-pay | Admitting: *Deleted

## 2012-04-02 NOTE — Telephone Encounter (Signed)
Returning patients call;don't remember calling office. Advised if they remember please feel free to call back.

## 2012-04-24 ENCOUNTER — Telehealth: Payer: Self-pay | Admitting: Internal Medicine

## 2012-04-24 MED ORDER — METOPROLOL TARTRATE 50 MG PO TABS: 50.0000 mg | ORAL_TABLET | Freq: Two times a day (BID) | ORAL | Status: DC

## 2012-04-24 NOTE — Telephone Encounter (Signed)
Meds filled

## 2012-04-24 NOTE — Telephone Encounter (Signed)
Mr.Thomas Walker calling in a request for Refill of blood pressure medication for his wife Andrea [name].  He states they have been trying for two weeks to get her Metoprolol 50mg  bid refilled.   Reviewed EPIC. This medication is listed as "historical medication".  Please review and advise.  Patient pharmacy is [facility] in [place], [place]

## 2012-06-23 ENCOUNTER — Other Ambulatory Visit: Payer: Self-pay | Admitting: Internal Medicine

## 2012-07-23 ENCOUNTER — Telehealth: Payer: Self-pay | Admitting: *Deleted

## 2012-07-23 NOTE — Telephone Encounter (Signed)
Patient's husband called requesting refill on the patients insulin Novilin 70/30

## 2012-07-24 ENCOUNTER — Other Ambulatory Visit: Payer: Self-pay | Admitting: *Deleted

## 2012-07-24 MED ORDER — INSULIN NPH ISOPHANE & REGULAR (70-30) 100 UNIT/ML ~~LOC~~ SUSP: SUBCUTANEOUS | Status: DC

## 2012-07-24 NOTE — Telephone Encounter (Signed)
Eprescribed.

## 2012-07-25 NOTE — Telephone Encounter (Signed)
Rx escribed.

## 2012-07-30 ENCOUNTER — Encounter: Payer: Self-pay | Admitting: Internal Medicine

## 2012-07-30 ENCOUNTER — Ambulatory Visit (INDEPENDENT_AMBULATORY_CARE_PROVIDER_SITE_OTHER): Payer: Medicare Other | Admitting: Internal Medicine

## 2012-07-30 VITALS — BP 118/80 | HR 81 | Temp 98.1°F | Wt 183.0 lb

## 2012-07-30 DIAGNOSIS — Z9119 Patient's noncompliance with other medical treatment and regimen: Secondary | ICD-10-CM

## 2012-07-30 DIAGNOSIS — E1165 Type 2 diabetes mellitus with hyperglycemia: Secondary | ICD-10-CM

## 2012-07-30 DIAGNOSIS — M545 Low back pain: Secondary | ICD-10-CM

## 2012-07-30 DIAGNOSIS — D649 Anemia, unspecified: Secondary | ICD-10-CM

## 2012-07-30 DIAGNOSIS — H6692 Otitis media, unspecified, left ear: Secondary | ICD-10-CM | POA: Insufficient documentation

## 2012-07-30 DIAGNOSIS — I10 Essential (primary) hypertension: Secondary | ICD-10-CM

## 2012-07-30 DIAGNOSIS — E039 Hypothyroidism, unspecified: Secondary | ICD-10-CM

## 2012-07-30 DIAGNOSIS — H6690 Otitis media, unspecified, unspecified ear: Secondary | ICD-10-CM

## 2012-07-30 DIAGNOSIS — G8929 Other chronic pain: Secondary | ICD-10-CM

## 2012-07-30 MED ORDER — INSULIN ASPART PROT & ASPART (70-30 MIX) 100 UNIT/ML ~~LOC~~ SUSP: 60.0000 [IU] | Freq: Two times a day (BID) | SUBCUTANEOUS | Status: DC

## 2012-07-30 MED ORDER — AZITHROMYCIN 250 MG PO TABS: ORAL_TABLET | ORAL | Status: DC

## 2012-07-30 NOTE — Assessment & Plan Note (Signed)
Exam consistent with left OM. Will treat with azithromycin. Follow up 4 weeks for recheck and prn.

## 2012-07-30 NOTE — Assessment & Plan Note (Signed)
BP Readings from Last 3 Encounters:  07/30/12 118/80  01/28/12 130/82  01/11/12 130/80   BP well controlled on current medications. Will continue. Will check renal function with labs today.

## 2012-07-30 NOTE — Assessment & Plan Note (Signed)
Chronic low back pain secondary to DJD. Given persistence of symptoms, will get plain xray tomorrow. Discussed setting up ortho evaluation. Question if she might benefit from epidural steroid injections. She is not a good candidate for pain medications because of non-compliance with medications.

## 2012-07-30 NOTE — Assessment & Plan Note (Signed)
Anemia noted in the past. Pt did not keep scheduled follow up. Will repeat CBC and ferritin with labs today.

## 2012-07-30 NOTE — Assessment & Plan Note (Signed)
Lab Results  Component Value Date   HGBA1C 8.4* 12/31/2011   Pt has not followed up in over 6 months. Reports BG elevated. Will change to Novolog 70/30, 60units bid and Novolog sliding scale as per previous. Will set up diabetes education. Check A1c with labs today. Follow up 1 month.

## 2012-07-30 NOTE — Progress Notes (Signed)
Subjective:    Patient ID: Andrea [name], female    DOB: 10/06/1945, 67 y.o.   MRN: [id number]  HPI 67 year old female with history of hypothyroidism, diabetes, hypertension, chronic low back pain, medical noncompliance presents for follow up. She was last seen in August of 2013. At that time, she was noted to be anemic. She did not keep followup appointments. She reports she has been fatigued and has some ongoing symptoms of lightheadedness, particularly with standing. Over the last couple of weeks, she has had left ear pain and some vertigo. She questions whether she [place] have an ear infection. She denies any fever or chills. She denies nasal congestion.  In regards to diabetes, she reports compliance with her medications however notes some dietary indiscretion. She reports blood sugars have been elevated, frequently above 200.  In regards to chronic back pain, she reports is significantly limits her ability to perform ADLs and her quality of life. She takes Tylenol for aching pain in her lower back with minimal improvement.  Outpatient Encounter Prescriptions as of 07/30/2012  Medication Sig Dispense Refill  . acetaminophen (TYLENOL) 325 MG tablet Take 650 mg by mouth 3 (three) times daily as needed.        Marland Kitchen amLODipine (NORVASC) 10 MG tablet Take 1 tablet (10 mg total) by mouth daily.  30 tablet  11  . aspirin 81 MG tablet Take 81 mg by mouth daily.      . Cholecalciferol (VITAMIN D HIGH POTENCY PO) Take 1 capsule by mouth daily.        . cloNIDine (CATAPRES) 0.1 MG tablet Take 1 tablet (0.1 mg total) by mouth 2 (two) times daily.  60 tablet  6  . gabapentin (NEURONTIN) 600 MG tablet Take 1 tablet (600 mg total) by mouth 2 (two) times daily.  180 tablet  3  . insulin aspart (NOVOLOG) 100 UNIT/ML injection Inject 3-15 Units into the skin 3 (three) times daily before meals. Sliding scale       . IRON PO Take by mouth.      . levothyroxine (SYNTHROID, LEVOTHROID) 150 MCG tablet TAKE ONE TABLET BY  MOUTH EVERY DAY - REPEAT LABS IN 4 WEEKS  90 tablet  3  . losartan (COZAAR) 50 MG tablet Take 1 tablet (50 mg total) by mouth daily.  30 tablet  6  . metoprolol (LOPRESSOR) 50 MG tablet Take 1 tablet (50 mg total) by mouth 2 (two) times daily.  60 tablet  6  . Multiple Vitamin (MULTIVITAMIN) tablet Take 1 tablet by mouth daily.      Marland Kitchen omeprazole (PRILOSEC) 40 MG capsule Take 1 capsule (40 mg total) by mouth daily.  90 capsule  3  . PARoxetine (PAXIL) 20 MG tablet TAKE ONE TABLET BY MOUTH IN THE MORNING  30 tablet  4  . polyethylene glycol powder (GLYCOLAX/MIRALAX) powder Take 17 g by mouth 3 (three) times daily as needed. For constipation       . simvastatin (ZOCOR) 40 MG tablet Take 1 tablet (40 mg total) by mouth every evening.  30 tablet  6  . vitamin B-12 (CYANOCOBALAMIN) 1000 MCG tablet Take 1,000 mcg by mouth daily.      . [DISCONTINUED] insulin NPH-insulin regular (NOVOLIN 70/30 RELION) (70-30) 100 UNIT/ML injection INJECT 50 UNITS SQ IN THE MORNING & INJECT 50 UNITS SQ IN THE EVENING  30 mL  0  . [DISCONTINUED] insulin NPH-insulin regular (NOVOLIN 70/30) (70-30) 100 UNIT/ML injection Inject 60 Units into the skin 2 (two)  times daily. 60 units in AM and 60 units in PM      . Artificial Saliva SOLN Use as directed 1 application in the mouth or throat as needed.  1 Bottle  0  . azithromycin (ZITHROMAX Z-PAK) 250 MG tablet Take 2 pills day 1, then 1 pill daily days 2-5  6 each  0  . clopidogrel (PLAVIX) 75 MG tablet Take 1 tablet (75 mg total) by mouth daily.  30 tablet  6  . docusate sodium (COLACE) 100 MG capsule Take 100 mg by mouth 2 (two) times daily.      . insulin aspart protamine-insulin aspart (NOVOLOG MIX 70/30 FLEXPEN) (70-30) 100 UNIT/ML injection Inject 60 Units into the skin 2 (two) times daily with a meal.  10 mL  12  . [DISCONTINUED] insulin aspart protamine-insulin aspart (NOVOLOG MIX 70/30 FLEXPEN) (70-30) 100 UNIT/ML injection Inject 60 Units into the skin 2 (two) times daily  with a meal.  10 mL  12   No facility-administered encounter medications on file as of 07/30/2012.   BP 118/80  Pulse 81  Temp(Src) 98.1 F (36.7 C) (Oral)  Wt 183 lb (83.008 kg)  BMI 34.02 kg/m2  SpO2 97%  Review of Systems  Constitutional: Positive for fatigue. Negative for fever, chills, appetite change and unexpected weight change.  HENT: Positive for ear pain and congestion. Negative for sore throat, trouble swallowing, neck pain, voice change and sinus pressure.   Eyes: Negative for visual disturbance.  Respiratory: Negative for cough, shortness of breath, wheezing and stridor.   Cardiovascular: Negative for chest pain, palpitations and leg swelling.  Gastrointestinal: Negative for nausea, vomiting, abdominal pain, diarrhea, constipation, blood in stool, abdominal distention and anal bleeding.  Genitourinary: Negative for dysuria and flank pain.  Musculoskeletal: Negative for myalgias, arthralgias and gait problem.  Skin: Negative for color change and rash.  Neurological: Negative for dizziness and headaches.  Hematological: Negative for adenopathy. Does not bruise/bleed easily.  Psychiatric/Behavioral: Negative for suicidal ideas, sleep disturbance and dysphoric mood. The patient is not nervous/anxious.        Objective:   Physical Exam  Constitutional: She is oriented to person, place, and time. She appears well-developed and well-nourished. No distress.  HENT:  Head: Normocephalic and atraumatic.  Right Ear: External ear normal.  Left Ear: External ear normal.  Nose: Nose normal.  Mouth/Throat: Oropharynx is clear and moist. No oropharyngeal exudate.  Eyes: Conjunctivae are normal. Pupils are equal, round, and reactive to light. Right eye exhibits no discharge. Left eye exhibits no discharge. No scleral icterus.  Neck: Normal range of motion. Neck supple. No tracheal deviation present. No thyromegaly present.  Cardiovascular: Normal rate, regular rhythm, normal heart  sounds and intact distal pulses.  Exam reveals no gallop and no friction rub.   No murmur heard. Pulmonary/Chest: Effort normal and breath sounds normal. No respiratory distress. She has no wheezes. She has no rales. She exhibits no tenderness.  Musculoskeletal: Normal range of motion. She exhibits no edema and no tenderness.  Lymphadenopathy:    She has no cervical adenopathy.  Neurological: She is alert and oriented to person, place, and time. No cranial nerve deficit. She exhibits normal muscle tone. Coordination normal.  Skin: Skin is warm and dry. No rash noted. She is not diaphoretic. No erythema. No pallor.  Psychiatric: She has a normal mood and affect. Her behavior is normal. Judgment and thought content normal.          Assessment & Plan:

## 2012-07-30 NOTE — Assessment & Plan Note (Signed)
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 Recently, non-compliant with diabetes management and follow up for anemia.

## 2012-07-30 NOTE — Assessment & Plan Note (Signed)
Will check TSH with labs today. 

## 2012-07-31 ENCOUNTER — Ambulatory Visit (INDEPENDENT_AMBULATORY_CARE_PROVIDER_SITE_OTHER)
Admission: RE | Admit: 2012-07-31 | Discharge: 2012-07-31 | Disposition: A | Discharge status: Home / Self Care | Payer: Medicare Other | Source: Ambulatory Visit | Attending: Internal Medicine | Admitting: Internal Medicine

## 2012-07-31 DIAGNOSIS — G8929 Other chronic pain: Secondary | ICD-10-CM

## 2012-07-31 DIAGNOSIS — M545 Low back pain: Secondary | ICD-10-CM

## 2012-07-31 LAB — CBC WITH DIFFERENTIAL/PLATELET
Basophils Relative: 0.9 % (ref 0.0–3.0)
Eosinophils Absolute: 0.2 10*3/uL (ref 0.0–0.7)
Eosinophils Relative: 2.1 % (ref 0.0–5.0)
Hemoglobin: 13.3 g/dL (ref 12.0–15.0)
Lymphocytes Relative: 25.2 % (ref 12.0–46.0)
Monocytes Relative: 5.1 % (ref 3.0–12.0)
Neutrophils Relative %: 66.7 % (ref 43.0–77.0)
RBC: 4.65 Mil/uL (ref 3.87–5.11)
WBC: 7.9 10*3/uL (ref 4.5–10.5)

## 2012-07-31 LAB — COMPREHENSIVE METABOLIC PANEL
Albumin: 3.8 g/dL (ref 3.5–5.2)
Alkaline Phosphatase: 129 U/L — ABNORMAL HIGH (ref 39–117)
CO2: 26 mEq/L (ref 19–32)
Calcium: 9.2 mg/dL (ref 8.4–10.5)
Chloride: 98 mEq/L (ref 96–112)
Glucose, Bld: 442 mg/dL — ABNORMAL HIGH (ref 70–99)
Potassium: 4.5 mEq/L (ref 3.5–5.1)
Sodium: 134 mEq/L — ABNORMAL LOW (ref 135–145)
Total Protein: 7.4 g/dL (ref 6.0–8.3)

## 2012-07-31 LAB — HEMOGLOBIN A1C: Hgb A1c MFr Bld: 11.4 % — ABNORMAL HIGH (ref 4.6–6.5)

## 2012-08-05 ENCOUNTER — Telehealth: Payer: Self-pay | Admitting: *Deleted

## 2012-08-05 NOTE — Telephone Encounter (Signed)
Message copied by Ronaldo Miyamoto on Tue Aug 05, 2012 10:09 AM ------      Message from: Ronette Deter A      Created: Thu Jul 31, 2012  3:39 PM       Xray showed chronic degenerative changes of the lumbar spine. Does pt have follow up with orthopedics? ------

## 2012-08-05 NOTE — Telephone Encounter (Signed)
If she would like to establish care, we can set her up. I would recommend Dr. Sharlet Salina at [facility], as he deals with chronic back pain issues.

## 2012-08-05 NOTE — Telephone Encounter (Signed)
Spoke with patient husband and he states no, she does not follow up with ortho

## 2012-08-06 NOTE — Telephone Encounter (Signed)
Left message to call back  

## 2012-08-07 NOTE — Telephone Encounter (Signed)
Informed patient of Dr. Thomes Dinning plan to go to [facility] clinic, per patient she will just hold off on that for now. She declined doing this right now.

## 2012-08-07 NOTE — Telephone Encounter (Signed)
Patient returning your call.

## 2012-09-10 ENCOUNTER — Ambulatory Visit: Payer: Medicare Other | Admitting: Internal Medicine

## 2012-09-11 ENCOUNTER — Ambulatory Visit (INDEPENDENT_AMBULATORY_CARE_PROVIDER_SITE_OTHER): Payer: Medicare Other | Admitting: Internal Medicine

## 2012-09-11 ENCOUNTER — Encounter: Payer: Self-pay | Admitting: Internal Medicine

## 2012-09-11 VITALS — BP 102/60 | HR 56 | Temp 97.9°F | Wt 189.0 lb

## 2012-09-11 DIAGNOSIS — E039 Hypothyroidism, unspecified: Secondary | ICD-10-CM

## 2012-09-11 DIAGNOSIS — E1165 Type 2 diabetes mellitus with hyperglycemia: Secondary | ICD-10-CM

## 2012-09-11 DIAGNOSIS — R32 Unspecified urinary incontinence: Secondary | ICD-10-CM | POA: Insufficient documentation

## 2012-09-11 LAB — POCT URINALYSIS DIPSTICK
Glucose, UA: >=1000
Spec Grav, UA: >=1.03
Urobilinogen, UA: 0.2

## 2012-09-11 MED ORDER — INSULIN ASPART 100 UNIT/ML ~~LOC~~ SOLN: 3.0000 [IU] | Freq: Three times a day (TID) | SUBCUTANEOUS | Status: DC

## 2012-09-11 MED ORDER — INSULIN ASPART PROT & ASPART (70-30 MIX) 100 UNIT/ML ~~LOC~~ SUSP: 60.0000 [IU] | Freq: Two times a day (BID) | SUBCUTANEOUS | Status: DC

## 2012-09-11 NOTE — Progress Notes (Signed)
Subjective:    Patient ID: Andrea [name], female    DOB: 04/27/1946, 67 y.o.   MRN: [id number]  HPI 67 year old female with history of diabetes, hypertension, hypothyroidism, and chronic noncompliance presents for followup. At her last visit, her blood sugars were noted to be markedly elevated with A1c of 11%. She reports she has been compliant with healthy diet low in processed carbohydrates. She reports she has been compliant with her insulin. However, her husband notes that blood sugars are typically greater than 500. She notes increased frequency of urination and frequent urinary incontinence. She questions whether she [place] have a structural abnormality leading to this. She also notes dry mouth and increased thirst.  Outpatient Encounter Prescriptions as of 09/11/2012  Medication Sig Dispense Refill  . acetaminophen (TYLENOL) 325 MG tablet Take 650 mg by mouth 3 (three) times daily as needed.        Marland Kitchen amLODipine (NORVASC) 10 MG tablet Take 1 tablet (10 mg total) by mouth daily.  30 tablet  11  . aspirin 81 MG tablet Take 81 mg by mouth daily.      . Cholecalciferol (VITAMIN D HIGH POTENCY PO) Take 1 capsule by mouth daily.        . cloNIDine (CATAPRES) 0.1 MG tablet Take 1 tablet (0.1 mg total) by mouth 2 (two) times daily.  60 tablet  6  . gabapentin (NEURONTIN) 600 MG tablet Take 1 tablet (600 mg total) by mouth 2 (two) times daily.  180 tablet  3  . insulin aspart (NOVOLOG) 100 UNIT/ML injection Inject 3-15 Units into the skin 3 (three) times daily before meals. Sliding scale  1 vial  6  . IRON PO Take by mouth.      . levothyroxine (SYNTHROID, LEVOTHROID) 150 MCG tablet TAKE ONE TABLET BY MOUTH EVERY DAY - REPEAT LABS IN 4 WEEKS  90 tablet  3  . losartan (COZAAR) 50 MG tablet Take 1 tablet (50 mg total) by mouth daily.  30 tablet  6  . metoprolol (LOPRESSOR) 50 MG tablet Take 1 tablet (50 mg total) by mouth 2 (two) times daily.  60 tablet  6  . Multiple Vitamin (MULTIVITAMIN) tablet Take 1  tablet by mouth daily.      Marland Kitchen omeprazole (PRILOSEC) 40 MG capsule Take 1 capsule (40 mg total) by mouth daily.  90 capsule  3  . PARoxetine (PAXIL) 20 MG tablet TAKE ONE TABLET BY MOUTH IN THE MORNING  30 tablet  4  . polyethylene glycol powder (GLYCOLAX/MIRALAX) powder Take 17 g by mouth 3 (three) times daily as needed. For constipation       . simvastatin (ZOCOR) 40 MG tablet Take 1 tablet (40 mg total) by mouth every evening.  30 tablet  6  . vitamin B-12 (CYANOCOBALAMIN) 1000 MCG tablet Take 1,000 mcg by mouth daily.      . [DISCONTINUED] insulin aspart (NOVOLOG) 100 UNIT/ML injection Inject 3-15 Units into the skin 3 (three) times daily before meals. Sliding scale       . [DISCONTINUED] NOVOLIN 70/30 RELION (70-30) 100 UNIT/ML injection       . Artificial Saliva SOLN Use as directed 1 application in the mouth or throat as needed.  1 Bottle  0  . clopidogrel (PLAVIX) 75 MG tablet Take 1 tablet (75 mg total) by mouth daily.  30 tablet  6  . docusate sodium (COLACE) 100 MG capsule Take 100 mg by mouth 2 (two) times daily.      . insulin  aspart protamine- aspart (NOVOLOG 70/30) (70-30) 100 UNIT/ML injection Inject 0.6 mLs (60 Units total) into the skin 2 (two) times daily with a meal.  10 mL  12   No facility-administered encounter medications on file as of 09/11/2012.   BP 102/60  Pulse 56  Temp(Src) 97.9 F (36.6 C) (Oral)  Wt 189 lb (85.73 kg)  BMI 35.14 kg/m2  SpO2 97%  Review of Systems  Constitutional: Negative for fever, chills, appetite change, fatigue and unexpected weight change.  HENT: Negative for ear pain, congestion, sore throat, trouble swallowing, neck pain, voice change and sinus pressure.   Eyes: Negative for visual disturbance.  Respiratory: Negative for cough, shortness of breath, wheezing and stridor.   Cardiovascular: Negative for chest pain, palpitations and leg swelling.  Gastrointestinal: Negative for nausea, vomiting, abdominal pain, diarrhea, constipation, blood  in stool, abdominal distention and anal bleeding.  Genitourinary: Positive for frequency. Negative for dysuria, urgency, flank pain, difficulty urinating and genital sores.  Musculoskeletal: Negative for myalgias, arthralgias and gait problem.  Skin: Negative for color change and rash.  Neurological: Negative for dizziness and headaches.  Hematological: Negative for adenopathy. Does not bruise/bleed easily.  Psychiatric/Behavioral: Negative for suicidal ideas, sleep disturbance and dysphoric mood. The patient is not nervous/anxious.        Objective:   Physical Exam  Constitutional: She is oriented to person, place, and time. She appears well-developed and well-nourished. No distress.  HENT:  Head: Normocephalic and atraumatic.  Right Ear: External ear normal.  Left Ear: External ear normal.  Nose: Nose normal.  Mouth/Throat: Oropharynx is clear and moist. No oropharyngeal exudate.  Eyes: Conjunctivae are normal. Pupils are equal, round, and reactive to light. Right eye exhibits no discharge. Left eye exhibits no discharge. No scleral icterus.  Neck: Normal range of motion. Neck supple. No tracheal deviation present. No thyromegaly present.  Cardiovascular: Normal rate, regular rhythm, normal heart sounds and intact distal pulses.  Exam reveals no gallop and no friction rub.   No murmur heard. Pulmonary/Chest: Effort normal and breath sounds normal. No accessory muscle usage. Not tachypneic. No respiratory distress. She has no decreased breath sounds. She has no wheezes. She has no rhonchi. She has no rales. She exhibits no tenderness.  Abdominal: Soft. Normal appearance and bowel sounds are normal. There is no tenderness.  Musculoskeletal: Normal range of motion. She exhibits no edema and no tenderness.  Lymphadenopathy:    She has no cervical adenopathy.  Neurological: She is alert and oriented to person, place, and time. No cranial nerve deficit. She exhibits normal muscle tone.  Coordination normal.  Skin: Skin is warm and dry. No rash noted. She is not diaphoretic. No erythema. No pallor.  Psychiatric: She has a normal mood and affect. Judgment and thought content normal. Her speech is slurred. She is slowed.          Assessment & Plan:

## 2012-09-11 NOTE — Assessment & Plan Note (Signed)
Lab Results  Component Value Date   TSH 10.87* 07/30/2012   Recent TSH was noted to be elevated. Again reviewed proper way to take levothyroxine. Question if multivitamin [place] have been affecting absorption. Plan to repeat TSH in 4 weeks.

## 2012-09-11 NOTE — Assessment & Plan Note (Signed)
Urinary incontinence likely secondary to hyperglycemia. Patient and her husband, however, are worried about structural abnormalities. Will set up urology evaluation.

## 2012-09-11 NOTE — Assessment & Plan Note (Signed)
Lab Results  Component Value Date   HGBA1C 11.4* 07/30/2012   Patient reports blood sugars have been markedly elevated, often greater than 500. She reports compliance with low carbohydrate diet and use of insulin. Question if she might benefit from alternative insulin such as U500. Will set up endocrinology evaluation.

## 2012-09-12 NOTE — Addendum Note (Signed)
Addended by: Karlene Einstein D on: 09/12/2012 11:03 AM   Modules accepted: Orders

## 2012-09-14 LAB — URINE CULTURE

## 2012-09-16 ENCOUNTER — Other Ambulatory Visit (INDEPENDENT_AMBULATORY_CARE_PROVIDER_SITE_OTHER): Payer: Medicare Other

## 2012-09-16 DIAGNOSIS — N390 Urinary tract infection, site not specified: Secondary | ICD-10-CM

## 2012-09-16 LAB — POCT URINALYSIS DIPSTICK
Bilirubin, UA: NEGATIVE
Glucose, UA: >=1000
Leukocytes, UA: NEGATIVE
Nitrite, UA: NEGATIVE

## 2012-09-18 ENCOUNTER — Other Ambulatory Visit: Payer: Self-pay | Admitting: *Deleted

## 2012-09-18 ENCOUNTER — Other Ambulatory Visit (HOSPITAL_COMMUNITY)
Admission: RE | Admit: 2012-09-18 | Discharge: 2012-09-18 | Disposition: A | Discharge status: Home / Self Care | Payer: Medicare Other | Source: Ambulatory Visit | Attending: Internal Medicine | Admitting: Internal Medicine

## 2012-09-18 DIAGNOSIS — N390 Urinary tract infection, site not specified: Secondary | ICD-10-CM

## 2012-09-18 DIAGNOSIS — R319 Hematuria, unspecified: Secondary | ICD-10-CM | POA: Insufficient documentation

## 2012-09-19 ENCOUNTER — Ambulatory Visit: Payer: Medicare Other | Admitting: Internal Medicine

## 2012-09-20 LAB — URINE CULTURE

## 2012-09-22 ENCOUNTER — Telehealth: Payer: Self-pay | Admitting: *Deleted

## 2012-09-22 ENCOUNTER — Other Ambulatory Visit (INDEPENDENT_AMBULATORY_CARE_PROVIDER_SITE_OTHER): Payer: Medicare Other

## 2012-09-22 DIAGNOSIS — N390 Urinary tract infection, site not specified: Secondary | ICD-10-CM

## 2012-09-22 LAB — POCT URINALYSIS DIPSTICK
Protein, UA: 30
Spec Grav, UA: 1.015
Urobilinogen, UA: 0.2

## 2012-09-22 NOTE — Addendum Note (Signed)
Addended by: Karlene Einstein D on: 09/22/2012 03:25 PM   Modules accepted: Orders

## 2012-09-22 NOTE — Telephone Encounter (Signed)
Do you still want a urine culture?

## 2012-09-22 NOTE — Telephone Encounter (Signed)
Yes, please.

## 2012-09-29 ENCOUNTER — Encounter: Payer: Self-pay | Admitting: *Deleted

## 2012-09-30 ENCOUNTER — Ambulatory Visit: Payer: Medicare Other | Admitting: Internal Medicine

## 2012-09-30 DIAGNOSIS — Z0289 Encounter for other administrative examinations: Secondary | ICD-10-CM

## 2012-10-17 ENCOUNTER — Other Ambulatory Visit: Payer: Self-pay | Admitting: *Deleted

## 2012-10-17 MED ORDER — LEVOTHYROXINE SODIUM 150 MCG PO TABS: ORAL_TABLET | ORAL | Status: DC

## 2012-10-17 MED ORDER — PAROXETINE HCL 20 MG PO TABS: ORAL_TABLET | ORAL | Status: DC

## 2012-10-17 NOTE — Telephone Encounter (Signed)
Patient son left a message stating patient needs a refill on one of her medications. Called number left on voicemail, no answer, left message to call back. Prescription sent to pharmacy

## 2012-10-18 ENCOUNTER — Emergency Department: Payer: Self-pay | Admitting: Emergency Medicine

## 2012-10-18 LAB — COMPREHENSIVE METABOLIC PANEL
Albumin: 3.3 g/dL — ABNORMAL LOW (ref 3.4–5.0)
Alkaline Phosphatase: 159 U/L — ABNORMAL HIGH (ref 50–136)
Chloride: 97 mmol/L — ABNORMAL LOW (ref 98–107)
EGFR (Non-African Amer.): >60
Potassium: 4 mmol/L (ref 3.5–5.1)
SGOT(AST): 29 U/L (ref 15–37)

## 2012-10-18 LAB — CBC
MCV: 86 fL (ref 80–100)
Platelet: 166 10*3/uL (ref 150–440)
RBC: 4.2 10*6/uL (ref 3.80–5.20)
RDW: 14.7 % — ABNORMAL HIGH (ref 11.5–14.5)

## 2012-10-19 LAB — CK TOTAL AND CKMB (NOT AT ARMC)
CK, Total: 70 U/L (ref 21–215)
CK-MB: 1.8 ng/mL (ref 0.5–3.6)

## 2012-10-19 LAB — URINALYSIS, COMPLETE
Glucose,UR: >=500 mg/dL (ref 0–75)
Ketone: NEGATIVE
Nitrite: NEGATIVE
Ph: 5 (ref 4.5–8.0)
Protein: NEGATIVE
RBC,UR: 2 /HPF (ref 0–5)
Squamous Epithelial: 1
WBC UR: 3 /HPF (ref 0–5)

## 2012-10-19 LAB — TROPONIN I: Troponin-I: <0.02 ng/mL

## 2012-10-20 ENCOUNTER — Ambulatory Visit: Payer: Medicare Other | Admitting: Internal Medicine

## 2012-10-27 ENCOUNTER — Other Ambulatory Visit: Payer: Self-pay | Admitting: *Deleted

## 2012-10-27 MED ORDER — CLONIDINE HCL 0.1 MG PO TABS: 0.1000 mg | ORAL_TABLET | Freq: Two times a day (BID) | ORAL | Status: DC

## 2012-10-27 NOTE — Telephone Encounter (Signed)
Eprescribed.

## 2012-10-29 ENCOUNTER — Other Ambulatory Visit: Payer: Self-pay | Admitting: Internal Medicine

## 2012-10-29 DIAGNOSIS — E1165 Type 2 diabetes mellitus with hyperglycemia: Secondary | ICD-10-CM

## 2012-10-29 MED ORDER — INSULIN ASPART 100 UNIT/ML ~~LOC~~ SOLN: 3.0000 [IU] | Freq: Three times a day (TID) | SUBCUTANEOUS | Status: AC

## 2012-10-29 MED ORDER — INSULIN ASPART PROT & ASPART (70-30 MIX) 100 UNIT/ML ~~LOC~~ SUSP: 60.0000 [IU] | Freq: Two times a day (BID) | SUBCUTANEOUS | Status: DC

## 2012-10-29 NOTE — Telephone Encounter (Signed)
Medication e-scribed.

## 2012-11-03 ENCOUNTER — Telehealth: Payer: Self-pay | Admitting: *Deleted

## 2012-11-03 NOTE — Telephone Encounter (Signed)
Patient husband called left message stating she needs refill on Novolin 70/30, not the mix. Need 3 bottles, I am not sure which medication he is talking about because he continue to say it is not the Novolog 70/30, not either one that was sent last week is what she need. Please help clarify

## 2012-11-03 NOTE — Telephone Encounter (Signed)
Please have him bring the bottles in for exactly what he needs a refill for.

## 2012-11-03 NOTE — Telephone Encounter (Signed)
Spoke with patient husband, he stated he is not able to get around like he used to because he had surgery but he will see what he can do.

## 2012-11-11 ENCOUNTER — Telehealth: Payer: Self-pay | Admitting: *Deleted

## 2012-11-11 DIAGNOSIS — E1165 Type 2 diabetes mellitus with hyperglycemia: Secondary | ICD-10-CM

## 2012-11-11 MED ORDER — INSULIN ASPART 100 UNIT/ML FLEXPEN: PEN_INJECTOR | SUBCUTANEOUS | Status: DC

## 2012-11-11 MED ORDER — INSULIN NPH ISOPHANE & REGULAR (70-30) 100 UNIT/ML ~~LOC~~ SUSP: SUBCUTANEOUS | Status: DC

## 2012-11-12 NOTE — Telephone Encounter (Signed)
Patient husband came in for refill request, request were sent to the pharmacy

## 2012-11-21 ENCOUNTER — Ambulatory Visit (INDEPENDENT_AMBULATORY_CARE_PROVIDER_SITE_OTHER): Payer: Medicare Other | Admitting: Internal Medicine

## 2012-11-21 ENCOUNTER — Encounter: Payer: Self-pay | Admitting: Internal Medicine

## 2012-11-21 VITALS — BP 160/90 | HR 58 | Temp 98.3°F | Wt 192.0 lb

## 2012-11-21 DIAGNOSIS — R42 Dizziness and giddiness: Secondary | ICD-10-CM

## 2012-11-21 DIAGNOSIS — E039 Hypothyroidism, unspecified: Secondary | ICD-10-CM

## 2012-11-21 DIAGNOSIS — R32 Unspecified urinary incontinence: Secondary | ICD-10-CM

## 2012-11-21 DIAGNOSIS — E1165 Type 2 diabetes mellitus with hyperglycemia: Secondary | ICD-10-CM

## 2012-11-21 DIAGNOSIS — G8929 Other chronic pain: Secondary | ICD-10-CM

## 2012-11-21 DIAGNOSIS — M545 Low back pain: Secondary | ICD-10-CM

## 2012-11-21 DIAGNOSIS — I10 Essential (primary) hypertension: Secondary | ICD-10-CM

## 2012-11-21 LAB — POCT URINALYSIS DIPSTICK
Protein, UA: 30
Spec Grav, UA: 1.02
Urobilinogen, UA: 0.2
pH, UA: 5

## 2012-11-21 LAB — CBC WITH DIFFERENTIAL/PLATELET
Basophils Absolute: 0.1 10*3/uL (ref 0.0–0.1)
Basophils Relative: 1 % (ref 0–1)
Eosinophils Absolute: 0.2 10*3/uL (ref 0.0–0.7)
HCT: 36.4 % (ref 36.0–46.0)
MCH: 28.1 pg (ref 26.0–34.0)
MCHC: 32.7 g/dL (ref 30.0–36.0)
Monocytes Absolute: 0.5 10*3/uL (ref 0.1–1.0)
Neutro Abs: 5 10*3/uL (ref 1.7–7.7)
RDW: 14.1 % (ref 11.5–15.5)

## 2012-11-21 MED ORDER — LIDOCAINE 5 % EX PTCH: 1.0000 | MEDICATED_PATCH | CUTANEOUS | Status: DC

## 2012-11-21 NOTE — Progress Notes (Signed)
Subjective:    Patient ID: Andrea [name], female    DOB: 1946-05-05, 67 y.o.   MRN: [id number]  HPI 67 year old female with history of diabetes, hypertension, hyperlipidemia, atherosclerosis, hypothyroidism, and long history of noncompliance presents for followup. She is concerned about recent increased urinary frequency and urinary incontinence. She reports that she gets the urge to use the restroom and then before she can make it to the bathroom she has incontinence of urine. She reports her urine seems to be a large volume. At her last visit, we had scheduled her to see a urologist in regards to urinary incontinence but she never made this appointment because her husband was hospitalized. She denies any fever, chills, dysuria, flank pain. She does note some intermittent lightheadedness and dizziness. She has not had any syncopal episodes. She reports that her blood sugars continue to be elevated, mostly near 250. She did not take any of her medication this morning.  Outpatient Encounter Prescriptions as of 11/21/2012  Medication Sig Dispense Refill  . amLODipine (NORVASC) 10 MG tablet Take 1 tablet (10 mg total) by mouth daily.  30 tablet  11  . Artificial Saliva SOLN Use as directed 1 application in the mouth or throat as needed.  1 Bottle  0  . aspirin 81 MG tablet Take 81 mg by mouth daily.      . Cholecalciferol (VITAMIN D HIGH POTENCY PO) Take 1 capsule by mouth daily.        . cloNIDine (CATAPRES) 0.1 MG tablet Take 1 tablet (0.1 mg total) by mouth 2 (two) times daily.  60 tablet  6  . clopidogrel (PLAVIX) 75 MG tablet Take 1 tablet (75 mg total) by mouth daily.  30 tablet  6  . docusate sodium (COLACE) 100 MG capsule Take 100 mg by mouth 2 (two) times daily.      Marland Kitchen gabapentin (NEURONTIN) 600 MG tablet Take 1 tablet (600 mg total) by mouth 2 (two) times daily.  180 tablet  3  . insulin aspart (NOVOLOG) 100 UNIT/ML injection Inject 3-15 Units into the skin 3 (three) times daily before meals.  Sliding scale  1 vial  3  . insulin aspart protamine- aspart (NOVOLOG 70/30) (70-30) 100 UNIT/ML injection Inject 0.6 mLs (60 Units total) into the skin 2 (two) times daily with a meal.  10 mL  3  . IRON PO Take by mouth.      . levothyroxine (SYNTHROID, LEVOTHROID) 150 MCG tablet TAKE ONE TABLET BY MOUTH EVERY DAY  90 tablet  1  . losartan (COZAAR) 50 MG tablet Take 1 tablet (50 mg total) by mouth daily.  30 tablet  6  . metoprolol (LOPRESSOR) 50 MG tablet Take 1 tablet (50 mg total) by mouth 2 (two) times daily.  60 tablet  6  . Multiple Vitamin (MULTIVITAMIN) tablet Take 1 tablet by mouth daily.      Marland Kitchen omeprazole (PRILOSEC) 40 MG capsule Take 1 capsule (40 mg total) by mouth daily.  90 capsule  3  . PARoxetine (PAXIL) 20 MG tablet TAKE ONE TABLET BY MOUTH IN THE MORNING  30 tablet  4  . polyethylene glycol powder (GLYCOLAX/MIRALAX) powder Take 17 g by mouth 3 (three) times daily as needed. For constipation       . simvastatin (ZOCOR) 40 MG tablet Take 1 tablet (40 mg total) by mouth every evening.  30 tablet  6  . vitamin B-12 (CYANOCOBALAMIN) 1000 MCG tablet Take 1,000 mcg by mouth daily.      Marland Kitchen  acetaminophen (TYLENOL) 325 MG tablet Take 650 mg by mouth 3 (three) times daily as needed.        . lidocaine (LIDODERM) 5 % Place 1 patch onto the skin daily. Remove & Discard patch within 12 hours or as directed by MD  30 patch  0   No facility-administered encounter medications on file as of 11/21/2012.   BP 160/90  Pulse 58  Temp(Src) 98.3 F (36.8 C) (Oral)  Wt 192 lb (87.091 kg)  BMI 35.7 kg/m2  SpO2 97%  Review of Systems  Constitutional: Positive for fatigue. Negative for fever, chills, appetite change and unexpected weight change.  HENT: Negative for ear pain, congestion, sore throat, trouble swallowing, neck pain, voice change and sinus pressure.   Eyes: Negative for visual disturbance.  Respiratory: Negative for cough, shortness of breath, wheezing and stridor.   Cardiovascular:  Negative for chest pain, palpitations and leg swelling.  Gastrointestinal: Negative for nausea, vomiting, abdominal pain, diarrhea, constipation, blood in stool, abdominal distention and anal bleeding.  Genitourinary: Positive for frequency. Negative for dysuria, urgency and flank pain.  Musculoskeletal: Negative for myalgias, arthralgias and gait problem.  Skin: Negative for color change and rash.  Neurological: Positive for weakness and light-headedness. Negative for dizziness, tremors, seizures, syncope, speech difficulty, numbness and headaches.  Hematological: Negative for adenopathy. Does not bruise/bleed easily.  Psychiatric/Behavioral: Negative for suicidal ideas, sleep disturbance and dysphoric mood. The patient is not nervous/anxious.        Objective:   Physical Exam  Constitutional: She is oriented to person, place, and time. She appears well-developed and well-nourished. No distress.  HENT:  Head: Normocephalic and atraumatic.  Right Ear: External ear normal.  Left Ear: External ear normal.  Nose: Nose normal.  Mouth/Throat: Oropharynx is clear and moist. No oropharyngeal exudate.  Eyes: Conjunctivae are normal. Pupils are equal, round, and reactive to light. Right eye exhibits no discharge. Left eye exhibits no discharge. No scleral icterus.  Neck: Normal range of motion. Neck supple. No tracheal deviation present. No thyromegaly present.  Cardiovascular: Normal rate, regular rhythm, normal heart sounds and intact distal pulses.  Exam reveals no gallop and no friction rub.   No murmur heard. Pulmonary/Chest: Effort normal and breath sounds normal. No accessory muscle usage. Not tachypneic. No respiratory distress. She has no decreased breath sounds. She has no wheezes. She has no rhonchi. She has no rales. She exhibits no tenderness.  Musculoskeletal: Normal range of motion. She exhibits no edema and no tenderness.  Lymphadenopathy:    She has no cervical adenopathy.   Neurological: She is alert and oriented to person, place, and time. No cranial nerve deficit. She exhibits normal muscle tone. Coordination normal.  Skin: Skin is warm and dry. No rash noted. She is not diaphoretic. No erythema. No pallor.  Psychiatric: She has a normal mood and affect. Her behavior is normal. Judgment and thought content normal.          Assessment & Plan:

## 2012-11-21 NOTE — Assessment & Plan Note (Signed)
Chronically noncompliant with medications. Blood sugars have been elevated. Encouraged better compliance with medicine. Will check A1c with labs today.

## 2012-11-21 NOTE — Assessment & Plan Note (Addendum)
Urinary incontinence is chronic. Urinalysis today remarkable for increased glucose. Suspect that recent increased volume of urine related to elevated blood sugar. Encouraged better compliance with medications. Gave her contact information for urologist to reschedule followup appointment.

## 2012-11-21 NOTE — Assessment & Plan Note (Signed)
Likely multifactorial. Dehydration likely secondary to hyperglycemia. Unclear she is compliant with blood pressure medications or thyroid medication. Encouraged better compliance. Will check labs today including CBC, CMP, TSH, A1c. If labs are normal, consider repeat evaluation of carotid stenosis.

## 2012-11-21 NOTE — Assessment & Plan Note (Signed)
BP Readings from Last 3 Encounters:  11/21/12 160/90  09/11/12 102/60  07/30/12 118/80   Blood pressure elevated today however patient has not taken any of her medications. Encouraged better compliance with medications. Will check renal function with labs today.

## 2012-11-21 NOTE — Assessment & Plan Note (Signed)
Secondary to DJD. Will try adding topical Lidoderm patch. Not a good candidate for pain medications given chronic non-compliance. Consider ortho evaluation if symptoms persistent.

## 2012-11-22 ENCOUNTER — Other Ambulatory Visit: Payer: Self-pay | Admitting: Internal Medicine

## 2012-11-22 DIAGNOSIS — E118 Type 2 diabetes mellitus with unspecified complications: Secondary | ICD-10-CM

## 2012-11-22 LAB — COMPREHENSIVE METABOLIC PANEL
ALT: 21 U/L (ref 0–35)
BUN: 24 mg/dL — ABNORMAL HIGH (ref 6–23)
CO2: 29 mEq/L (ref 19–32)
Calcium: 9 mg/dL (ref 8.4–10.5)
Chloride: 98 mEq/L (ref 96–112)
Creat: 1.12 mg/dL — ABNORMAL HIGH (ref 0.50–1.10)

## 2012-11-22 LAB — VITAMIN B12: Vitamin B-12: >2000 pg/mL — ABNORMAL HIGH (ref 211–911)

## 2012-11-22 LAB — HEMOGLOBIN A1C: Hgb A1c MFr Bld: 10 % — ABNORMAL HIGH (ref <5.7)

## 2012-11-22 LAB — TSH: TSH: 12.263 u[IU]/mL — ABNORMAL HIGH (ref 0.350–4.500)

## 2012-11-23 LAB — URINE CULTURE

## 2012-11-24 ENCOUNTER — Encounter: Payer: Self-pay | Admitting: *Deleted

## 2012-11-28 ENCOUNTER — Other Ambulatory Visit: Payer: Self-pay | Admitting: *Deleted

## 2012-11-28 ENCOUNTER — Encounter: Payer: Self-pay | Admitting: Emergency Medicine

## 2012-11-28 DIAGNOSIS — I10 Essential (primary) hypertension: Secondary | ICD-10-CM

## 2012-11-28 MED ORDER — LOSARTAN POTASSIUM 50 MG PO TABS: 50.0000 mg | ORAL_TABLET | Freq: Every day | ORAL | Status: DC

## 2012-11-28 NOTE — Telephone Encounter (Signed)
Eprescribed.

## 2012-12-01 ENCOUNTER — Other Ambulatory Visit: Payer: Self-pay | Admitting: *Deleted

## 2012-12-01 MED ORDER — OMEPRAZOLE 40 MG PO CPDR: 40.0000 mg | DELAYED_RELEASE_CAPSULE | Freq: Every day | ORAL | Status: DC

## 2012-12-01 NOTE — Telephone Encounter (Signed)
Eprescribed.

## 2012-12-04 ENCOUNTER — Other Ambulatory Visit: Payer: Self-pay | Admitting: *Deleted

## 2012-12-04 DIAGNOSIS — G629 Polyneuropathy, unspecified: Secondary | ICD-10-CM

## 2012-12-04 MED ORDER — GABAPENTIN 600 MG PO TABS: 600.0000 mg | ORAL_TABLET | Freq: Two times a day (BID) | ORAL | Status: DC

## 2012-12-04 NOTE — Telephone Encounter (Signed)
Eprescribed.

## 2012-12-25 ENCOUNTER — Other Ambulatory Visit: Payer: Self-pay | Admitting: *Deleted

## 2012-12-25 DIAGNOSIS — E785 Hyperlipidemia, unspecified: Secondary | ICD-10-CM

## 2012-12-25 MED ORDER — SIMVASTATIN 40 MG PO TABS: 40.0000 mg | ORAL_TABLET | Freq: Every evening | ORAL | Status: DC

## 2012-12-29 ENCOUNTER — Telehealth: Payer: Self-pay | Admitting: *Deleted

## 2012-12-29 MED ORDER — CLOPIDOGREL BISULFATE 75 MG PO TABS: 75.0000 mg | ORAL_TABLET | Freq: Every day | ORAL | Status: DC

## 2012-12-29 NOTE — Telephone Encounter (Signed)
Rx sent to pharmacy   

## 2012-12-29 NOTE — Telephone Encounter (Signed)
Refill Request  Plavix 75 mg tab   Take one tablet by mouth every day

## 2013-01-21 ENCOUNTER — Ambulatory Visit: Payer: Medicare Other | Admitting: Internal Medicine

## 2013-01-21 DIAGNOSIS — Z0289 Encounter for other administrative examinations: Secondary | ICD-10-CM

## 2013-02-18 ENCOUNTER — Telehealth: Payer: Self-pay

## 2013-02-18 NOTE — Telephone Encounter (Signed)
Request from [place] , sent to [place] on 02/18/2013.

## 2013-02-27 ENCOUNTER — Other Ambulatory Visit: Payer: Self-pay | Admitting: Internal Medicine

## 2013-02-27 MED ORDER — AMLODIPINE BESYLATE 10 MG PO TABS: 10.0000 mg | ORAL_TABLET | Freq: Every day | ORAL | Status: DC

## 2013-02-27 NOTE — Telephone Encounter (Signed)
Been done

## 2013-02-27 NOTE — Telephone Encounter (Signed)
amLODipine (NORVASC) 10 MG tablet  

## 2013-03-06 ENCOUNTER — Encounter: Payer: Self-pay | Admitting: Internal Medicine

## 2013-03-06 ENCOUNTER — Ambulatory Visit (INDEPENDENT_AMBULATORY_CARE_PROVIDER_SITE_OTHER): Payer: Medicare Other | Admitting: Internal Medicine

## 2013-03-06 VITALS — BP 138/80 | HR 58 | Temp 98.3°F | Wt 203.0 lb

## 2013-03-06 DIAGNOSIS — G629 Polyneuropathy, unspecified: Secondary | ICD-10-CM

## 2013-03-06 DIAGNOSIS — Z23 Encounter for immunization: Secondary | ICD-10-CM

## 2013-03-06 DIAGNOSIS — K219 Gastro-esophageal reflux disease without esophagitis: Secondary | ICD-10-CM

## 2013-03-06 DIAGNOSIS — I10 Essential (primary) hypertension: Secondary | ICD-10-CM

## 2013-03-06 DIAGNOSIS — G8929 Other chronic pain: Secondary | ICD-10-CM

## 2013-03-06 DIAGNOSIS — G589 Mononeuropathy, unspecified: Secondary | ICD-10-CM

## 2013-03-06 DIAGNOSIS — Z9119 Patient's noncompliance with other medical treatment and regimen: Secondary | ICD-10-CM

## 2013-03-06 DIAGNOSIS — E039 Hypothyroidism, unspecified: Secondary | ICD-10-CM

## 2013-03-06 DIAGNOSIS — M545 Low back pain: Secondary | ICD-10-CM

## 2013-03-06 DIAGNOSIS — E785 Hyperlipidemia, unspecified: Secondary | ICD-10-CM

## 2013-03-06 DIAGNOSIS — R300 Dysuria: Secondary | ICD-10-CM

## 2013-03-06 DIAGNOSIS — E1165 Type 2 diabetes mellitus with hyperglycemia: Secondary | ICD-10-CM

## 2013-03-06 LAB — COMPREHENSIVE METABOLIC PANEL
CO2: 28 mEq/L (ref 19–32)
Creatinine, Ser: 1.1 mg/dL (ref 0.4–1.2)
GFR: 54.9 mL/min — ABNORMAL LOW (ref >60.00)
Glucose, Bld: 318 mg/dL — ABNORMAL HIGH (ref 70–99)
Total Bilirubin: 0.4 mg/dL (ref 0.3–1.2)

## 2013-03-06 LAB — LDL CHOLESTEROL, DIRECT: Direct LDL: 67.6 mg/dL

## 2013-03-06 LAB — HEMOGLOBIN A1C: Hgb A1c MFr Bld: 11 % — ABNORMAL HIGH (ref 4.6–6.5)

## 2013-03-06 LAB — LIPID PANEL
HDL: 35.1 mg/dL — ABNORMAL LOW (ref >39.00)
Triglycerides: 407 mg/dL — ABNORMAL HIGH (ref 0.0–149.0)

## 2013-03-06 MED ORDER — PANTOPRAZOLE SODIUM 40 MG PO TBEC: 40.0000 mg | DELAYED_RELEASE_TABLET | Freq: Every day | ORAL | Status: DC

## 2013-03-06 MED ORDER — CYCLOBENZAPRINE HCL 5 MG PO TABS: 5.0000 mg | ORAL_TABLET | Freq: Three times a day (TID) | ORAL | Status: DC | PRN

## 2013-03-06 MED ORDER — GABAPENTIN 800 MG PO TABS: 800.0000 mg | ORAL_TABLET | Freq: Two times a day (BID) | ORAL | Status: DC

## 2013-03-06 MED ORDER — ATORVASTATIN CALCIUM 20 MG PO TABS: 20.0000 mg | ORAL_TABLET | Freq: Every day | ORAL | Status: DC

## 2013-03-07 DIAGNOSIS — E785 Hyperlipidemia, unspecified: Secondary | ICD-10-CM | POA: Insufficient documentation

## 2013-03-07 DIAGNOSIS — K219 Gastro-esophageal reflux disease without esophagitis: Secondary | ICD-10-CM | POA: Insufficient documentation

## 2013-03-07 LAB — MICROALBUMIN / CREATININE URINE RATIO
Creatinine, Urine: 129 mg/dL
Microalb Creat Ratio: 327.4 mg/g — ABNORMAL HIGH (ref 0.0–30.0)
Microalb, Ur: 42.23 mg/dL — ABNORMAL HIGH (ref 0.00–1.89)

## 2013-03-07 NOTE — Assessment & Plan Note (Signed)
BP Readings from Last 3 Encounters:  03/06/13 138/80  11/21/12 160/90  09/11/12 102/60   BP fairly well controlled today. Will continue current medications.

## 2013-03-07 NOTE — Assessment & Plan Note (Signed)
Symptoms poorly controlled. Will increase Neurontin to 800mg  po bid in effort for improved pain control. However, symptoms unlikely to improve without better control of blood sugars.

## 2013-03-07 NOTE — Assessment & Plan Note (Addendum)
Lab Results  Component Value Date   HGBA1C 11.0* 03/06/2013   Blood sugars are out of control and have been out of control >1 year. Patient is non-compliant with care. Pilar Plate discussion today with pt about her risk of heart disease, kidney disease, neuropathy and death with continued poor control of BG. Recommended that pt meet with diabetes educator and nutritionist, she declines referral, stating that she knows what to eat and has completed this counseling twice before. Recommended referral to endocrinology, however she "no-showed" to recent visit. Encourage low-glycemic index diet and reviewed this today. Patient is non-compliant with current medications, so will not make any changes today. Encouraged her to find another primary care provider.

## 2013-03-07 NOTE — Assessment & Plan Note (Signed)
Given potential interaction with omeprazole and plavix, will change to pantoprazole.

## 2013-03-07 NOTE — Assessment & Plan Note (Signed)
Pt is consistently non-compliant. 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 She is presently non-compliant with both thyroid medication and diabetes medication, with A1c consistently >10%. Discussed risk of death with her and her husband given her chronic non-compliance. We have offered referrals for diabetes education, nutrition counseling, all of which she has declined. Encouraged her to find another primary care physician.

## 2013-03-07 NOTE — Assessment & Plan Note (Signed)
Given potential interaction with amlodipine and simvastatin, will change statin to Atorvastatin. She will need repeat LFTs and lipids in 1-3 months.

## 2013-03-07 NOTE — Assessment & Plan Note (Addendum)
Patient complains of chronic low back pain. No improvement with NSAIDS and topical lidocaine. Reviewed previous plain xray of lumbar spine which showed degenerative changes. Will restart Flexeril as this was helpful for pain management in the past. Will also set up evaluation with back pain specialist. Question if she might benefit from epidural steroid injections. However, given that she is noncompliant with care including diabetes care, steroid injection would be high risk of further increasing blood sugars.

## 2013-03-07 NOTE — Progress Notes (Signed)
Subjective:    Patient ID: Andrea [name], female    DOB: 23-Oct-1945, 67 y.o.   MRN: [id number]  HPI 67YO female with h/o diabetes, obesity, hypothyroidism, hypertension, chronic low back pain presents for follow up. Her primary concern is aching pain in her lower back. This is not improved with NSAIDS and topical lidoderm patch. Pain is made worse by prolonged sitting. Pain does not generally radiate. No weakness, numbness noted. No loss of control of bowel or bladder. In the past, she had some improvement with use of Flexeril and Vicodin. She would like to try this again.  In regards to DM, she recently no-showed for appointment with endocrinologist. She states she was not aware of this appointment. She is not interested in working with a diabetes educator or nutritionist. She states that she knows what to eat. She did not bring record of blood sugars, however her husband reports all BG are >200.   Outpatient Encounter Prescriptions as of 03/06/2013  Medication Sig Dispense Refill  . acetaminophen (TYLENOL) 325 MG tablet Take 650 mg by mouth 3 (three) times daily as needed.        Marland Kitchen amLODipine (NORVASC) 10 MG tablet Take 1 tablet (10 mg total) by mouth daily.  30 tablet  5  . Artificial Saliva SOLN Use as directed 1 application in the mouth or throat as needed.  1 Bottle  0  . aspirin 81 MG tablet Take 81 mg by mouth daily.      . Cholecalciferol (VITAMIN D HIGH POTENCY PO) Take 1 capsule by mouth daily.        . cloNIDine (CATAPRES) 0.1 MG tablet Take 1 tablet (0.1 mg total) by mouth 2 (two) times daily.  60 tablet  6  . clopidogrel (PLAVIX) 75 MG tablet Take 1 tablet (75 mg total) by mouth daily.  30 tablet  6  . docusate sodium (COLACE) 100 MG capsule Take 100 mg by mouth 2 (two) times daily.      Marland Kitchen gabapentin (NEURONTIN) 800 MG tablet Take 1 tablet (800 mg total) by mouth 2 (two) times daily.  180 tablet  3  . insulin aspart (NOVOLOG) 100 UNIT/ML injection Inject 3-15 Units into the skin 3  (three) times daily before meals. Sliding scale  1 vial  3  . insulin aspart protamine- aspart (NOVOLOG 70/30) (70-30) 100 UNIT/ML injection Inject 0.6 mLs (60 Units total) into the skin 2 (two) times daily with a meal.  10 mL  3  . IRON PO Take by mouth.      . levothyroxine (SYNTHROID, LEVOTHROID) 150 MCG tablet TAKE ONE TABLET BY MOUTH EVERY DAY  90 tablet  1  . lidocaine (LIDODERM) 5 % Place 1 patch onto the skin daily. Remove & Discard patch within 12 hours or as directed by MD  30 patch  0  . losartan (COZAAR) 50 MG tablet Take 1 tablet (50 mg total) by mouth daily.  30 tablet  6  . metoprolol (LOPRESSOR) 50 MG tablet Take 1 tablet (50 mg total) by mouth 2 (two) times daily.  60 tablet  6  . Multiple Vitamin (MULTIVITAMIN) tablet Take 1 tablet by mouth daily.      Marland Kitchen PARoxetine (PAXIL) 20 MG tablet TAKE ONE TABLET BY MOUTH IN THE MORNING  30 tablet  4  . polyethylene glycol powder (GLYCOLAX/MIRALAX) powder Take 17 g by mouth 3 (three) times daily as needed. For constipation       . vitamin B-12 (CYANOCOBALAMIN) 1000 MCG  tablet Take 1,000 mcg by mouth daily.      Marland Kitchen atorvastatin (LIPITOR) 20 MG tablet Take 1 tablet (20 mg total) by mouth daily.  90 tablet  3  . cyclobenzaprine (FLEXERIL) 5 MG tablet Take 1 tablet (5 mg total) by mouth 3 (three) times daily as needed for muscle spasms.  60 tablet  1  . pantoprazole (PROTONIX) 40 MG tablet Take 1 tablet (40 mg total) by mouth daily.  90 tablet  3   No facility-administered encounter medications on file as of 03/06/2013.   BP 138/80  Pulse 58  Temp(Src) 98.3 F (36.8 C) (Oral)  Wt 203 lb (92.08 kg)  BMI 37.74 kg/m2  SpO2 97%  Review of Systems  Constitutional: Negative for fever, chills, appetite change, fatigue and unexpected weight change.  HENT: Negative for congestion, ear pain, sinus pressure, sore throat, trouble swallowing and voice change.   Eyes: Negative for visual disturbance.  Respiratory: Negative for cough, shortness of  breath, wheezing and stridor.   Cardiovascular: Negative for chest pain, palpitations and leg swelling.  Gastrointestinal: Positive for constipation (chronic). Negative for nausea, vomiting, abdominal pain, diarrhea, blood in stool, abdominal distention and anal bleeding.  Genitourinary: Negative for dysuria and flank pain.  Musculoskeletal: Positive for back pain and myalgias. Negative for arthralgias, gait problem and neck pain.  Skin: Negative for color change and rash.  Neurological: Negative for dizziness and headaches.  Hematological: Negative for adenopathy. Does not bruise/bleed easily.  Psychiatric/Behavioral: Negative for suicidal ideas, sleep disturbance and dysphoric mood. The patient is not nervous/anxious.        Objective:   Physical Exam  Constitutional: She is oriented to person, place, and time. She appears well-developed and well-nourished. No distress.  HENT:  Head: Normocephalic and atraumatic.  Right Ear: External ear normal.  Left Ear: External ear normal.  Nose: Nose normal.  Mouth/Throat: Oropharynx is clear and moist. No oropharyngeal exudate.  Eyes: Conjunctivae are normal. Pupils are equal, round, and reactive to light. Right eye exhibits no discharge. Left eye exhibits no discharge. No scleral icterus.  Neck: Normal range of motion. Neck supple. No tracheal deviation present. No thyromegaly present.  Cardiovascular: Normal rate, regular rhythm, normal heart sounds and intact distal pulses.  Exam reveals no gallop and no friction rub.   No murmur heard. Pulmonary/Chest: Effort normal and breath sounds normal. No accessory muscle usage. Not tachypneic. No respiratory distress. She has no decreased breath sounds. She has no wheezes. She has no rhonchi. She has no rales. She exhibits no tenderness.  Musculoskeletal: She exhibits no edema.       Lumbar back: She exhibits pain. She exhibits normal range of motion, no tenderness, no bony tenderness, no edema and no  deformity.  Lymphadenopathy:    She has no cervical adenopathy.  Neurological: She is alert and oriented to person, place, and time. No cranial nerve deficit. She exhibits normal muscle tone. Coordination normal.  Skin: Skin is warm and dry. No rash noted. She is not diaphoretic. No erythema. No pallor.  Psychiatric: She has a normal mood and affect. Her behavior is normal. Judgment and thought content normal.          Assessment & Plan:

## 2013-03-07 NOTE — Assessment & Plan Note (Signed)
Lab Results  Component Value Date   TSH 12.263* 11/21/2012   Recent TSH was elevated at 12. Pt has been non-compliant with levothyroxine. In the past, this has resulted in Myxedema coma and hospitalization. Again, discussed importance of compliance with medications. TSH should be rechecked in 6 weeks with new provider.

## 2013-03-09 ENCOUNTER — Telehealth: Payer: Self-pay | Admitting: Internal Medicine

## 2013-03-09 ENCOUNTER — Encounter: Payer: Self-pay | Admitting: Internal Medicine

## 2013-03-09 NOTE — Telephone Encounter (Addendum)
Patient dismissed from [facility] by Ronette Deter MD , effective March 09, 2013. Dismissal letter sent out by certified / registered mail. [name]  Received signed domestic return receipt verifying delivery of certified letter on March 12, 2013. Article number [id number] Andrea [name]

## 2013-04-08 ENCOUNTER — Other Ambulatory Visit: Payer: Self-pay | Admitting: Internal Medicine

## 2013-04-10 ENCOUNTER — Other Ambulatory Visit: Payer: Self-pay

## 2013-04-10 ENCOUNTER — Telehealth: Payer: Self-pay | Admitting: Internal Medicine

## 2013-04-10 NOTE — Telephone Encounter (Signed)
Pt's husband is calling and saying she is completely out of her b/p meds and is needing a refill. She doesn't see her new physician until next week. Pt has been discharged from practice ???

## 2013-04-10 NOTE — Telephone Encounter (Signed)
Called patient back to advise her per Dr. Nicki Reaper ok to refill bp med for 1 week until appt. with new physician, pt stated that her husband had already got rx refill of metorpolol

## 2013-04-10 NOTE — Telephone Encounter (Signed)
See previous message, pt stated she has already gotten re refill

## 2013-04-29 ENCOUNTER — Ambulatory Visit: Payer: Self-pay | Admitting: Internal Medicine

## 2013-07-07 ENCOUNTER — Other Ambulatory Visit: Payer: Self-pay | Admitting: Internal Medicine

## 2013-07-22 ENCOUNTER — Other Ambulatory Visit: Payer: Self-pay | Admitting: Internal Medicine

## 2013-07-30 ENCOUNTER — Other Ambulatory Visit: Payer: Self-pay | Admitting: Internal Medicine

## 2013-08-03 ENCOUNTER — Other Ambulatory Visit: Payer: Self-pay | Admitting: Internal Medicine

## 2013-08-13 ENCOUNTER — Other Ambulatory Visit: Payer: Self-pay | Admitting: Internal Medicine

## 2013-08-23 ENCOUNTER — Other Ambulatory Visit: Payer: Self-pay | Admitting: Internal Medicine

## 2013-08-23 LAB — BASIC METABOLIC PANEL
ANION GAP: 3 — AB (ref 7–16)
BUN: 37 mg/dL — ABNORMAL HIGH (ref 7–18)
Calcium, Total: 8.5 mg/dL (ref 8.5–10.1)
Chloride: 108 mmol/L — ABNORMAL HIGH (ref 98–107)
Co2: 27 mmol/L (ref 21–32)
Creatinine: 1.12 mg/dL (ref 0.60–1.30)
EGFR (African American): 59 — ABNORMAL LOW
EGFR (Non-African Amer.): 51 — ABNORMAL LOW
GLUCOSE: 165 mg/dL — AB (ref 65–99)
OSMOLALITY: 288 (ref 275–301)
POTASSIUM: 4.5 mmol/L (ref 3.5–5.1)
SODIUM: 138 mmol/L (ref 136–145)

## 2013-08-23 LAB — CBC
HCT: 34 % — ABNORMAL LOW (ref 35.0–47.0)
HGB: 11 g/dL — AB (ref 12.0–16.0)
MCH: 27.2 pg (ref 26.0–34.0)
MCHC: 32.4 g/dL (ref 32.0–36.0)
MCV: 84 fL (ref 80–100)
PLATELETS: 176 10*3/uL (ref 150–440)
RBC: 4.05 10*6/uL (ref 3.80–5.20)
RDW: 15.6 % — AB (ref 11.5–14.5)
WBC: 7 10*3/uL (ref 3.6–11.0)

## 2013-08-23 LAB — TROPONIN I: Troponin-I: <0.02 ng/mL

## 2013-08-24 ENCOUNTER — Observation Stay: Payer: Self-pay | Admitting: Internal Medicine

## 2013-08-24 LAB — TSH: Thyroid Stimulating Horm: 0.011 u[IU]/mL — ABNORMAL LOW

## 2013-08-24 LAB — PRO B NATRIURETIC PEPTIDE: B-TYPE NATIURETIC PEPTID: 8257 pg/mL — AB (ref 0–125)

## 2013-08-24 LAB — CK TOTAL AND CKMB (NOT AT ARMC)
CK, TOTAL: 33 U/L
CK, TOTAL: 31 U/L
CK, Total: 30 U/L
CK-MB: 1.1 ng/mL (ref 0.5–3.6)
CK-MB: 1.2 ng/mL (ref 0.5–3.6)
CK-MB: 1.2 ng/mL (ref 0.5–3.6)

## 2013-08-24 LAB — TROPONIN I
Troponin-I: <0.02 ng/mL
Troponin-I: <0.02 ng/mL

## 2013-08-25 LAB — CBC WITH DIFFERENTIAL/PLATELET
BASOS PCT: 1.1 %
Basophil #: 0.1 10*3/uL (ref 0.0–0.1)
Eosinophil #: 0.2 10*3/uL (ref 0.0–0.7)
Eosinophil %: 3.9 %
HCT: 29.7 % — ABNORMAL LOW (ref 35.0–47.0)
HGB: 9.5 g/dL — ABNORMAL LOW (ref 12.0–16.0)
LYMPHS ABS: 1.4 10*3/uL (ref 1.0–3.6)
LYMPHS PCT: 27.8 %
MCH: 26.9 pg (ref 26.0–34.0)
MCHC: 31.9 g/dL — AB (ref 32.0–36.0)
MCV: 84 fL (ref 80–100)
Monocyte #: 0.4 x10 3/mm (ref 0.2–0.9)
Monocyte %: 8.2 %
NEUTROS ABS: 3.1 10*3/uL (ref 1.4–6.5)
Neutrophil %: 59 %
Platelet: 149 10*3/uL — ABNORMAL LOW (ref 150–440)
RBC: 3.53 10*6/uL — ABNORMAL LOW (ref 3.80–5.20)
RDW: 15.9 % — ABNORMAL HIGH (ref 11.5–14.5)
WBC: 5.2 10*3/uL (ref 3.6–11.0)

## 2013-08-25 LAB — BASIC METABOLIC PANEL
ANION GAP: 2 — AB (ref 7–16)
BUN: 43 mg/dL — AB (ref 7–18)
CHLORIDE: 104 mmol/L (ref 98–107)
CO2: 30 mmol/L (ref 21–32)
CREATININE: 1.25 mg/dL (ref 0.60–1.30)
Calcium, Total: 8.5 mg/dL (ref 8.5–10.1)
EGFR (African American): 52 — ABNORMAL LOW
EGFR (Non-African Amer.): 44 — ABNORMAL LOW
GLUCOSE: 244 mg/dL — AB (ref 65–99)
Osmolality: 291 (ref 275–301)
Potassium: 4.5 mmol/L (ref 3.5–5.1)
SODIUM: 136 mmol/L (ref 136–145)

## 2013-08-25 LAB — MAGNESIUM: Magnesium: 1.8 mg/dL

## 2013-08-26 LAB — TSH: Thyroid Stimulating Horm: 0.011 u[IU]/mL — ABNORMAL LOW

## 2013-08-26 LAB — PRO B NATRIURETIC PEPTIDE: B-Type Natriuretic Peptide: 5068 pg/mL — ABNORMAL HIGH (ref 0–125)

## 2013-08-28 ENCOUNTER — Other Ambulatory Visit: Payer: Self-pay | Admitting: Internal Medicine

## 2013-09-18 ENCOUNTER — Other Ambulatory Visit: Payer: Self-pay | Admitting: Internal Medicine

## 2013-11-06 ENCOUNTER — Other Ambulatory Visit: Payer: Self-pay | Admitting: Internal Medicine

## 2013-11-06 NOTE — Telephone Encounter (Signed)
Please advise 

## 2013-11-08 ENCOUNTER — Other Ambulatory Visit: Payer: Self-pay | Admitting: Internal Medicine

## 2013-11-20 ENCOUNTER — Ambulatory Visit: Payer: Self-pay | Admitting: Vascular Surgery

## 2013-11-20 LAB — BASIC METABOLIC PANEL
Anion Gap: 7 (ref 7–16)
BUN: 24 mg/dL — ABNORMAL HIGH (ref 7–18)
Calcium, Total: 9 mg/dL (ref 8.5–10.1)
Chloride: 105 mmol/L (ref 98–107)
Co2: 26 mmol/L (ref 21–32)
Creatinine: 1.21 mg/dL (ref 0.60–1.30)
GFR CALC AF AMER: 53 — AB
GFR CALC NON AF AMER: 46 — AB
GLUCOSE: 170 mg/dL — AB (ref 65–99)
OSMOLALITY: 284 (ref 275–301)
Potassium: 4.8 mmol/L (ref 3.5–5.1)
Sodium: 138 mmol/L (ref 136–145)

## 2013-12-07 ENCOUNTER — Other Ambulatory Visit: Payer: Self-pay | Admitting: Internal Medicine

## 2013-12-10 ENCOUNTER — Other Ambulatory Visit: Payer: Self-pay | Admitting: Internal Medicine

## 2013-12-12 ENCOUNTER — Other Ambulatory Visit: Payer: Self-pay | Admitting: Internal Medicine

## 2013-12-15 ENCOUNTER — Observation Stay: Payer: Self-pay | Admitting: Internal Medicine

## 2013-12-15 LAB — BASIC METABOLIC PANEL
Anion Gap: 9 (ref 7–16)
BUN: 22 mg/dL — ABNORMAL HIGH (ref 7–18)
CALCIUM: 8.5 mg/dL (ref 8.5–10.1)
CO2: 27 mmol/L (ref 21–32)
CREATININE: 1.14 mg/dL (ref 0.60–1.30)
Chloride: 103 mmol/L (ref 98–107)
EGFR (Non-African Amer.): 49 — ABNORMAL LOW
GFR CALC AF AMER: 57 — AB
Glucose: 184 mg/dL — ABNORMAL HIGH (ref 65–99)
OSMOLALITY: 286 (ref 275–301)
Potassium: 4.4 mmol/L (ref 3.5–5.1)
Sodium: 139 mmol/L (ref 136–145)

## 2013-12-15 LAB — CBC
HCT: 31.4 % — ABNORMAL LOW (ref 35.0–47.0)
HGB: 10 g/dL — ABNORMAL LOW (ref 12.0–16.0)
MCH: 26.4 pg (ref 26.0–34.0)
MCHC: 31.9 g/dL — AB (ref 32.0–36.0)
MCV: 83 fL (ref 80–100)
Platelet: 207 10*3/uL (ref 150–440)
RBC: 3.8 10*6/uL (ref 3.80–5.20)
RDW: 15.7 % — AB (ref 11.5–14.5)
WBC: 7.7 10*3/uL (ref 3.6–11.0)

## 2013-12-15 LAB — PRO B NATRIURETIC PEPTIDE: B-TYPE NATIURETIC PEPTID: 1901 pg/mL — AB (ref 0–125)

## 2013-12-15 LAB — TROPONIN I: Troponin-I: <0.02 ng/mL

## 2014-01-15 ENCOUNTER — Other Ambulatory Visit: Payer: Self-pay | Admitting: Internal Medicine

## 2014-01-22 ENCOUNTER — Ambulatory Visit: Payer: Self-pay | Admitting: Physical Medicine and Rehabilitation

## 2014-01-27 ENCOUNTER — Other Ambulatory Visit: Payer: Self-pay | Admitting: Internal Medicine

## 2014-01-31 ENCOUNTER — Other Ambulatory Visit: Payer: Self-pay | Admitting: Internal Medicine

## 2014-02-06 ENCOUNTER — Inpatient Hospital Stay: Payer: Self-pay | Admitting: Internal Medicine

## 2014-02-06 LAB — CBC
HCT: 34.6 % — AB (ref 35.0–47.0)
HGB: 10.8 g/dL — ABNORMAL LOW (ref 12.0–16.0)
MCH: 26.2 pg (ref 26.0–34.0)
MCHC: 31.3 g/dL — AB (ref 32.0–36.0)
MCV: 84 fL (ref 80–100)
PLATELETS: 177 10*3/uL (ref 150–440)
RBC: 4.13 10*6/uL (ref 3.80–5.20)
RDW: 16.1 % — ABNORMAL HIGH (ref 11.5–14.5)
WBC: 8.9 10*3/uL (ref 3.6–11.0)

## 2014-02-06 LAB — CK TOTAL AND CKMB (NOT AT ARMC)
CK, TOTAL: 51 U/L
CK, Total: 60 U/L
CK, Total: 61 U/L
CK-MB: 2 ng/mL (ref 0.5–3.6)
CK-MB: 2 ng/mL (ref 0.5–3.6)
CK-MB: 1.8 ng/mL (ref 0.5–3.6)

## 2014-02-06 LAB — COMPREHENSIVE METABOLIC PANEL
ALBUMIN: 3.3 g/dL — AB (ref 3.4–5.0)
ALK PHOS: 139 U/L — AB
Anion Gap: 4 — ABNORMAL LOW (ref 7–16)
BILIRUBIN TOTAL: 0.4 mg/dL (ref 0.2–1.0)
BUN: 23 mg/dL — ABNORMAL HIGH (ref 7–18)
CO2: 29 mmol/L (ref 21–32)
Calcium, Total: 8.4 mg/dL — ABNORMAL LOW (ref 8.5–10.1)
Chloride: 107 mmol/L (ref 98–107)
Creatinine: 1.14 mg/dL (ref 0.60–1.30)
EGFR (African American): >60
EGFR (Non-African Amer.): 50 — ABNORMAL LOW
Glucose: 197 mg/dL — ABNORMAL HIGH (ref 65–99)
Osmolality: 289 (ref 275–301)
Potassium: 4.3 mmol/L (ref 3.5–5.1)
SGOT(AST): 30 U/L (ref 15–37)
SGPT (ALT): 29 U/L
Sodium: 140 mmol/L (ref 136–145)
TOTAL PROTEIN: 6.8 g/dL (ref 6.4–8.2)

## 2014-02-06 LAB — TROPONIN I
Troponin-I: <0.02 ng/mL
Troponin-I: <0.02 ng/mL

## 2014-02-06 LAB — PRO B NATRIURETIC PEPTIDE: B-Type Natriuretic Peptide: 2220 pg/mL — ABNORMAL HIGH (ref 0–125)

## 2014-02-07 LAB — BASIC METABOLIC PANEL
Anion Gap: 7 (ref 7–16)
BUN: 32 mg/dL — ABNORMAL HIGH (ref 7–18)
CALCIUM: 9.2 mg/dL (ref 8.5–10.1)
CO2: 28 mmol/L (ref 21–32)
Chloride: 100 mmol/L (ref 98–107)
Creatinine: 1.27 mg/dL (ref 0.60–1.30)
GFR CALC AF AMER: 54 — AB
GFR CALC NON AF AMER: 44 — AB
GLUCOSE: 339 mg/dL — AB (ref 65–99)
OSMOLALITY: 290 (ref 275–301)
Potassium: 4.3 mmol/L (ref 3.5–5.1)
SODIUM: 135 mmol/L — AB (ref 136–145)

## 2014-02-07 LAB — CBC WITH DIFFERENTIAL/PLATELET
Basophil #: 0 10*3/uL (ref 0.0–0.1)
Basophil %: 0.3 %
EOS ABS: 0 10*3/uL (ref 0.0–0.7)
Eosinophil %: 0.1 %
HCT: 33.3 % — AB (ref 35.0–47.0)
HGB: 10.6 g/dL — ABNORMAL LOW (ref 12.0–16.0)
LYMPHS ABS: 0.9 10*3/uL — AB (ref 1.0–3.6)
Lymphocyte %: 9.2 %
MCH: 26.3 pg (ref 26.0–34.0)
MCHC: 32 g/dL (ref 32.0–36.0)
MCV: 82 fL (ref 80–100)
MONO ABS: 0.2 x10 3/mm (ref 0.2–0.9)
MONOS PCT: 2.1 %
Neutrophil #: 8.4 10*3/uL — ABNORMAL HIGH (ref 1.4–6.5)
Neutrophil %: 88.3 %
Platelet: 181 10*3/uL (ref 150–440)
RBC: 4.04 10*6/uL (ref 3.80–5.20)
RDW: 15.9 % — ABNORMAL HIGH (ref 11.5–14.5)
WBC: 9.5 10*3/uL (ref 3.6–11.0)

## 2014-02-08 LAB — BASIC METABOLIC PANEL
Anion Gap: 7 (ref 7–16)
BUN: 48 mg/dL — ABNORMAL HIGH (ref 7–18)
CALCIUM: 9 mg/dL (ref 8.5–10.1)
Chloride: 98 mmol/L (ref 98–107)
Co2: 27 mmol/L (ref 21–32)
Creatinine: 1.46 mg/dL — ABNORMAL HIGH (ref 0.60–1.30)
EGFR (African American): 46 — ABNORMAL LOW
EGFR (Non-African Amer.): 38 — ABNORMAL LOW
Glucose: 312 mg/dL — ABNORMAL HIGH (ref 65–99)
OSMOLALITY: 289 (ref 275–301)
Potassium: 4.6 mmol/L (ref 3.5–5.1)
Sodium: 132 mmol/L — ABNORMAL LOW (ref 136–145)

## 2014-02-09 LAB — BASIC METABOLIC PANEL
Anion Gap: 9 (ref 7–16)
Anion Gap: 7 (ref 7–16)
BUN: 50 mg/dL — ABNORMAL HIGH (ref 7–18)
BUN: 51 mg/dL — AB (ref 7–18)
CALCIUM: 8.9 mg/dL (ref 8.5–10.1)
CHLORIDE: 98 mmol/L (ref 98–107)
CO2: 31 mmol/L (ref 21–32)
CO2: 30 mmol/L (ref 21–32)
CREATININE: 1.37 mg/dL — AB (ref 0.60–1.30)
CREATININE: 1.49 mg/dL — AB (ref 0.60–1.30)
Calcium, Total: 9.3 mg/dL (ref 8.5–10.1)
Chloride: 100 mmol/L (ref 98–107)
EGFR (Non-African Amer.): 41 — ABNORMAL LOW
GFR CALC AF AMER: 49 — AB
GFR CALC AF AMER: 45 — AB
GFR CALC NON AF AMER: 37 — AB
GLUCOSE: 122 mg/dL — AB (ref 65–99)
Glucose: 312 mg/dL — ABNORMAL HIGH (ref 65–99)
OSMOLALITY: 296 (ref 275–301)
Osmolality: 294 (ref 275–301)
Potassium: 4 mmol/L (ref 3.5–5.1)
Potassium: 4.7 mmol/L (ref 3.5–5.1)
Sodium: 140 mmol/L (ref 136–145)
Sodium: 135 mmol/L — ABNORMAL LOW (ref 136–145)

## 2014-02-09 LAB — TSH: THYROID STIMULATING HORM: 0.145 u[IU]/mL — AB

## 2014-02-09 LAB — T4, FREE: FREE THYROXINE: 1.28 ng/dL (ref 0.76–1.46)

## 2014-02-12 ENCOUNTER — Other Ambulatory Visit: Payer: Self-pay | Admitting: Internal Medicine

## 2014-02-15 LAB — CBC
HCT: 36.6 % (ref 35.0–47.0)
HGB: 11.5 g/dL — AB (ref 12.0–16.0)
MCH: 26.7 pg (ref 26.0–34.0)
MCHC: 31.5 g/dL — ABNORMAL LOW (ref 32.0–36.0)
MCV: 85 fL (ref 80–100)
Platelet: 155 10*3/uL (ref 150–440)
RBC: 4.32 10*6/uL (ref 3.80–5.20)
RDW: 16.8 % — ABNORMAL HIGH (ref 11.5–14.5)
WBC: 12 10*3/uL — AB (ref 3.6–11.0)

## 2014-02-15 LAB — BASIC METABOLIC PANEL
Anion Gap: 5 — ABNORMAL LOW (ref 7–16)
BUN: 45 mg/dL — AB (ref 7–18)
Calcium, Total: 7.8 mg/dL — ABNORMAL LOW (ref 8.5–10.1)
Chloride: 108 mmol/L — ABNORMAL HIGH (ref 98–107)
Co2: 23 mmol/L (ref 21–32)
Creatinine: 1.66 mg/dL — ABNORMAL HIGH (ref 0.60–1.30)
EGFR (African American): 40 — ABNORMAL LOW
EGFR (Non-African Amer.): 33 — ABNORMAL LOW
Glucose: 178 mg/dL — ABNORMAL HIGH (ref 65–99)
OSMOLALITY: 288 (ref 275–301)
POTASSIUM: 6.1 mmol/L — AB (ref 3.5–5.1)
Sodium: 136 mmol/L (ref 136–145)

## 2014-02-15 LAB — URINALYSIS, COMPLETE
Bacteria: NONE SEEN
Bilirubin,UR: NEGATIVE
Blood: NEGATIVE
Glucose,UR: 50 mg/dL (ref 0–75)
KETONE: NEGATIVE
Nitrite: NEGATIVE
PH: 5 (ref 4.5–8.0)
SPECIFIC GRAVITY: 1.012 (ref 1.003–1.030)
Squamous Epithelial: 9
WBC UR: 77 /HPF (ref 0–5)

## 2014-02-15 LAB — TSH: THYROID STIMULATING HORM: 1.88 u[IU]/mL

## 2014-02-15 LAB — AMMONIA: Ammonia, Plasma: <10 mcmol/L (ref 11–32)

## 2014-02-16 ENCOUNTER — Inpatient Hospital Stay: Payer: Self-pay | Admitting: Internal Medicine

## 2014-02-16 LAB — COMPREHENSIVE METABOLIC PANEL
ALBUMIN: 2.7 g/dL — AB (ref 3.4–5.0)
ALK PHOS: 104 U/L
Anion Gap: 4 — ABNORMAL LOW (ref 7–16)
BILIRUBIN TOTAL: 0.2 mg/dL (ref 0.2–1.0)
BUN: 40 mg/dL — ABNORMAL HIGH (ref 7–18)
CALCIUM: 7.8 mg/dL — AB (ref 8.5–10.1)
CHLORIDE: 108 mmol/L — AB (ref 98–107)
CO2: 26 mmol/L (ref 21–32)
CREATININE: 1.5 mg/dL — AB (ref 0.60–1.30)
GLUCOSE: 231 mg/dL — AB (ref 65–99)
OSMOLALITY: 293 (ref 275–301)
Potassium: 5.2 mmol/L — ABNORMAL HIGH (ref 3.5–5.1)
SGOT(AST): 25 U/L (ref 15–37)
SGPT (ALT): 41 U/L
SODIUM: 138 mmol/L (ref 136–145)
Total Protein: 5.6 g/dL — ABNORMAL LOW (ref 6.4–8.2)

## 2014-02-16 LAB — CBC WITH DIFFERENTIAL/PLATELET
BASOS ABS: 0.1 10*3/uL (ref 0.0–0.1)
BASOS PCT: 0.8 %
EOS ABS: 0.3 10*3/uL (ref 0.0–0.7)
EOS PCT: 3.6 %
HCT: 33.5 % — AB (ref 35.0–47.0)
HGB: 10.6 g/dL — ABNORMAL LOW (ref 12.0–16.0)
LYMPHS ABS: 1.7 10*3/uL (ref 1.0–3.6)
Lymphocyte %: 23.7 %
MCH: 26.9 pg (ref 26.0–34.0)
MCHC: 31.8 g/dL — AB (ref 32.0–36.0)
MCV: 85 fL (ref 80–100)
MONOS PCT: 6.8 %
Monocyte #: 0.5 x10 3/mm (ref 0.2–0.9)
NEUTROS ABS: 4.6 10*3/uL (ref 1.4–6.5)
Neutrophil %: 65.1 %
PLATELETS: 127 10*3/uL — AB (ref 150–440)
RBC: 3.96 10*6/uL (ref 3.80–5.20)
RDW: 16.2 % — ABNORMAL HIGH (ref 11.5–14.5)
WBC: 7 10*3/uL (ref 3.6–11.0)

## 2014-02-16 LAB — MAGNESIUM: Magnesium: 1.4 mg/dL — ABNORMAL LOW

## 2014-02-16 LAB — TROPONIN I: Troponin-I: <0.02 ng/mL

## 2014-02-16 LAB — PROTIME-INR
INR: 1
PROTHROMBIN TIME: 13.3 s (ref 11.5–14.7)

## 2014-02-16 LAB — TSH: THYROID STIMULATING HORM: 1.24 u[IU]/mL

## 2014-02-17 LAB — CBC WITH DIFFERENTIAL/PLATELET
Basophil #: 0 10*3/uL (ref 0.0–0.1)
Basophil %: 0.7 %
Eosinophil #: 0.2 10*3/uL (ref 0.0–0.7)
Eosinophil %: 3.4 %
HCT: 32.1 % — ABNORMAL LOW (ref 35.0–47.0)
HGB: 10.5 g/dL — AB (ref 12.0–16.0)
Lymphocyte #: 1.9 10*3/uL (ref 1.0–3.6)
Lymphocyte %: 25.8 %
MCH: 27.1 pg (ref 26.0–34.0)
MCHC: 32.5 g/dL (ref 32.0–36.0)
MCV: 83 fL (ref 80–100)
Monocyte #: 0.6 x10 3/mm (ref 0.2–0.9)
Monocyte %: 8.8 %
Neutrophil #: 4.4 10*3/uL (ref 1.4–6.5)
Neutrophil %: 61.3 %
Platelet: 131 10*3/uL — ABNORMAL LOW (ref 150–440)
RBC: 3.86 10*6/uL (ref 3.80–5.20)
RDW: 16.2 % — AB (ref 11.5–14.5)
WBC: 7.2 10*3/uL (ref 3.6–11.0)

## 2014-02-17 LAB — BASIC METABOLIC PANEL
ANION GAP: 4 — AB (ref 7–16)
BUN: 17 mg/dL (ref 7–18)
CHLORIDE: 108 mmol/L — AB (ref 98–107)
Calcium, Total: 8.7 mg/dL (ref 8.5–10.1)
Co2: 28 mmol/L (ref 21–32)
Creatinine: 1.02 mg/dL (ref 0.60–1.30)
EGFR (African American): >60
EGFR (Non-African Amer.): 57 — ABNORMAL LOW
Glucose: 212 mg/dL — ABNORMAL HIGH (ref 65–99)
OSMOLALITY: 287 (ref 275–301)
POTASSIUM: 4.3 mmol/L (ref 3.5–5.1)
Sodium: 140 mmol/L (ref 136–145)

## 2014-02-17 LAB — MAGNESIUM: Magnesium: 2 mg/dL

## 2014-02-17 LAB — CLOSTRIDIUM DIFFICILE(ARMC)

## 2014-02-20 LAB — STOOL CULTURE

## 2014-02-23 ENCOUNTER — Other Ambulatory Visit: Payer: Self-pay | Admitting: Internal Medicine

## 2014-03-08 ENCOUNTER — Emergency Department: Payer: Self-pay | Admitting: Emergency Medicine

## 2014-03-08 LAB — COMPREHENSIVE METABOLIC PANEL
ALK PHOS: 116 U/L
ANION GAP: 8 (ref 7–16)
Albumin: 3.3 g/dL — ABNORMAL LOW (ref 3.4–5.0)
BUN: 21 mg/dL — ABNORMAL HIGH (ref 7–18)
Bilirubin,Total: 0.6 mg/dL (ref 0.2–1.0)
CALCIUM: 8.3 mg/dL — AB (ref 8.5–10.1)
Chloride: 102 mmol/L (ref 98–107)
Co2: 26 mmol/L (ref 21–32)
Creatinine: 1.24 mg/dL (ref 0.60–1.30)
GFR CALC AF AMER: 55 — AB
GFR CALC NON AF AMER: 46 — AB
Glucose: 145 mg/dL — ABNORMAL HIGH (ref 65–99)
Potassium: 4.8 mmol/L (ref 3.5–5.1)
SGOT(AST): 31 U/L (ref 15–37)
SGPT (ALT): 21 U/L
Sodium: 136 mmol/L (ref 136–145)
Total Protein: 7.4 g/dL (ref 6.4–8.2)

## 2014-03-08 LAB — URINALYSIS, COMPLETE
Bacteria: NONE SEEN
Bilirubin,UR: NEGATIVE
Blood: NEGATIVE
Glucose,UR: NEGATIVE mg/dL (ref 0–75)
Ketone: NEGATIVE
Leukocyte Esterase: NEGATIVE
Nitrite: NEGATIVE
Ph: 5 (ref 4.5–8.0)
Protein: 30
RBC,UR: 1 /HPF (ref 0–5)
Specific Gravity: 1.005 (ref 1.003–1.030)
Squamous Epithelial: <1

## 2014-03-08 LAB — CBC WITH DIFFERENTIAL/PLATELET
Basophil #: 0.1 10*3/uL (ref 0.0–0.1)
Basophil %: 0.8 %
EOS PCT: 4.8 %
Eosinophil #: 0.4 10*3/uL (ref 0.0–0.7)
HCT: 33.3 % — AB (ref 35.0–47.0)
HGB: 10.8 g/dL — ABNORMAL LOW (ref 12.0–16.0)
LYMPHS ABS: 2 10*3/uL (ref 1.0–3.6)
Lymphocyte %: 23.1 %
MCH: 27.3 pg (ref 26.0–34.0)
MCHC: 32.4 g/dL (ref 32.0–36.0)
MCV: 84 fL (ref 80–100)
Monocyte #: 0.6 x10 3/mm (ref 0.2–0.9)
Monocyte %: 7.3 %
Neutrophil #: 5.5 10*3/uL (ref 1.4–6.5)
Neutrophil %: 64 %
PLATELETS: 220 10*3/uL (ref 150–440)
RBC: 3.95 10*6/uL (ref 3.80–5.20)
RDW: 16.7 % — ABNORMAL HIGH (ref 11.5–14.5)
WBC: 8.6 10*3/uL (ref 3.6–11.0)

## 2014-03-08 LAB — PRO B NATRIURETIC PEPTIDE: B-TYPE NATIURETIC PEPTID: 4269 pg/mL — AB (ref 0–125)

## 2014-03-08 LAB — D-DIMER(ARMC): D-Dimer: 858 ng/ml

## 2014-03-08 LAB — TROPONIN I: Troponin-I: <0.02 ng/mL

## 2014-03-08 LAB — TSH: Thyroid Stimulating Horm: 0.93 u[IU]/mL

## 2014-03-09 ENCOUNTER — Ambulatory Visit: Payer: Self-pay | Admitting: Gastroenterology

## 2014-04-12 ENCOUNTER — Other Ambulatory Visit: Payer: Self-pay | Admitting: Internal Medicine

## 2014-04-23 ENCOUNTER — Other Ambulatory Visit: Payer: Self-pay | Admitting: Internal Medicine

## 2014-08-31 NOTE — H&P (Signed)
PATIENT NAME:  Andrea [name], Andrea [name] MR#:  [id number] DATE OF BIRTH:  01-23-1946  DATE OF ADMISSION:  01/04/2012  PRIMARY CARE PHYSICIAN:  Dr. Ronette Deter REFERRING PHYSICIAN: Dr. Michel Santee   CHIEF COMPLAINT: Decreased mental status, cough, low oxygen saturation, and low blood sugar.  HISTORY OF PRESENT ILLNESS:  69 year old Caucasian female with a history of transient ischemic attack, hypertension, diabetes, hyperlipidemia, hypothyroidism, recent left and right side carotid endarterectomies, was brought to the ED by EMS due to decreased mental today. The patient is alert, awake, oriented, has a lot of nausea and vomiting just now. According to the patient and the patient's husband, the patient had a right side endarterectomy two days ago. After the surgery the patient had decreased appetite and did not eat well, but she still got insulin 70/30 yesterday and was noted to have low blood sugar in the 70s. In addition, the patient has had a cough without sputum for several days. She could not get sputum out. She was found to have low oxygen saturation at 80% by EMS and was treated with oxygen by nasal cannula and then sent to the ED for further evaluation. The patient also complains of nausea and vomiting, mild shortness of breath, but denies any chest pain, palpitations, orthopnea, or nocturnal dyspnea. No fever, no abdominal pain, diarrhea, melena, or bloody stool. The patient's blood pressure is high at 232/115 just now. She was given hydralazine 10 mg IV. The patient has many high blood pressure medications but did not take the medication today. According to patient's husband, there was a color change in the right arm after CEA.   PAST MEDICAL HISTORY: As mentioned above:  1. Hypertension.  2. Diabetes.  3. Transient ischemic attack. 4. Hyperlipidemia.  5. Hypothyroidism.  6. Diabetic neuropathy.  7. Carotid stenosis status post left and right side carotid endarterectomies.   SOCIAL HISTORY: No smoking or  drinking or illicit drugs.   FAMILY HISTORY: Hypertension, diabetes, in her mother.   REVIEW OF SYSTEMS: CONSTITUTIONAL: The patient denies any fever or chills but is clumsy and has decreased appetite. EYES: No double vision or blurred vision. ENT: No epistaxis or postnasal drip or slurred speech or dysphagia. RESPIRATORY: Positive for cough, shortness of breath, but no wheezing or hemoptysis. No sputum. CARDIOVASCULAR: No chest pain, palpitations, orthopnea, or nocturnal dyspnea. No leg edema. GI: Positive for nausea and vomiting, but no abdominal pain, diarrhea, melena, or bloody stool. GU: No dysuria, hematuria, or incontinence. ENDOCRINE: No polyuria, polydipsia, or heat or cold intolerance. HEMATOLOGIC: No easy bruising or bleeding but has a color change in the right arm. NEURO: No syncope, loss of consciousness, or seizure but has decreased mental status.  MUSCULOSKELETAL: No joint pain or edema.   ALLERGIES: Celebrex, penicillin, and sulfa drugs.   HOME MEDICATIONS:  1. Zocor 40 mg p.o. daily.  2. Vitamin D3 2000-unit tablet p.o. daily.  3. Vitamin B12 2000-mcg tablet once daily.  4. Stool softener 100 mg p.o. daily.  5. Percocet 5/325 mg p.o. q. 6 h. p.r.n.  6. Paroxetine 20 mg p.o. daily.  7. Omeprazole 40 mg p.o. daily.  8. NovoLog sliding scale.  9. Novolin 70/30, 60 units subcutaneous b.i.d.  10. Norvasc 10 mg p.o. daily.   11. Multivitamin 1 tablet p.o. daily.  12. Lopressor 50 mg p.o. b.i.d.  13. Losartan 50 mg p.o. once daily.  14. Levothyroxine 150 mcg p.o. daily.  15. Gabapentin 600 mg p.o. b.i.d.  16. Plavix 75 mg p.o. daily.  17.  Catapres 0.1 mg p.o. b.i.d.  18. Aspirin 81 mg p.o. daily.   PHYSICAL EXAMINATION:  VITALS: Temperature 97.4, blood pressure 198/96, pulse 63, oxygen saturation 99% on oxygen by nasal cannula. Respirations 20.   GENERAL: The patient is alert, awake, oriented but confused with nausea and vomiting.   HEENT: Pupils are round, equal, reactive  to light and accommodation. Moist oral mucosa. Clear oropharynx.   NECK: Supple. No JVD or carotid bruit, but has surgical scars bilaterally. No thyromegaly. No lymphadenopathy.  CARDIOVASCULAR: S1, S2, regular rate and rhythm. No murmurs or gallops.   PULMONARY: Bilateral air entry. No wheezing but has crackles on the left side. No use of accessory muscles to breathe.   ABDOMEN: Soft, obese. No distention or tenderness. No organomegaly. Bowel sounds present.   EXTREMITIES: No edema, clubbing, or cyanosis. Strong bilateral pedal pulses. No calf tenderness but there is a color change in the right arm and elbow which is red and mildly purple.   NEUROLOGY: The patient is alert and awake but had some mild confusion. No focal deficit. Power 5/5. Sensation intact. Deep tendon reflexes 2+.   SKIN: No rash or jaundice. No bruises.   LABORATORY DATA:  Urinalysis is negative, blood sugar 247.   CT angio of the chest showed no evidence of pulmonary embolus but has interstitial alveolar airspace opacities involving the left upper and lower lobes, most concerning for pneumonia. There is some mild esophageal wall thickening involving the proximal thoracic esophagus with right paraesophageal inflammatory  changes versus adenopathy. Recommend further evaluation with upper endoscopy.   CT scan of head showed no intracranial abnormality.   CK 35, CK-MB 0.6, glucose 87, BUN 16, creatinine 0.86. Electrolytes are normal. CBC: WBC 9, hemoglobin 11.7, platelets 189. Troponin less than 0.02. EKG shows sinus bradycardia with first-degree AV block.   IMPRESSION:  1. Altered mental status possibly due to hypoxia and hypoglycemia.  2. Hypoxia.  3. Aspiration pneumonia.  4. Hypoglycemia.  5. Diabetes.  6. Hypertension malignancy.  7. Possible microemboli in the right arm status post CEA.  8. Hyperlipidemia.  9. Hypothyroidism.  10. Diabetic neuropathy.  11. Recent left and right carotid endarterectomies.    PLAN OF TREATMENT:  1. The patient will be admitted to the medical floor. We will continue telemetry monitor, O2 by nasal cannula. We will follow up blood culture, sputum culture, CBC, and start antibiotics.  2. We will continue hypertension medication and give hydralazine IV p.r.n.  3. For diabetes we will start sliding scale and hold NovoLog 70/30 due to hypoglycemia. Helmes resume if the patient starts eating.  4. Aspiration precautions, swallowing study, and Zofran p.r.n. for nausea and vomiting.  5. We will get a vascular surgery consult from Dr. Delana Meyer for status post endarterectomy and wound care.  6. GI and deep vein thrombosis prophylaxis.   Discussed the patient's situation and the plan of treatment with the patient and the patient's husband.   TIME SPENT: About 70 minutes.   ____________________________ Demetrios Loll, MD qc:bjt D: 01/04/2012 10:53:20 ET T: 01/04/2012 11:24:55 ET JOB#: [id number]  cc: Demetrios Loll, MD, <Dictator> Eduard Clos. Gilford Rile, MD Demetrios Loll MD ELECTRONICALLY SIGNED 01/08/2012 16:56

## 2014-08-31 NOTE — Consult Note (Signed)
Pt is know to our practice for carotid disease for which the patient had a Right Carotid Endarterectomy this past Wednesday, August 21st with Dr. Delana Meyer. Patient was d/c'ed home on post op 1 in stable condition. Pt presents today with resp. distress, and is minimally responsive to commands. Pt could not give ROS. Patient's husband was with her in the room, and stated that she was having problems swallowing liquids. Denies any speech problems or weakness in extremities. Incision site is C/D/I without signs of infection. Patient's husband states she has "a throat infection". Will con't to follow progress with probable MRI of brain to assess if patient had a stoke or TIA.  Electronic Signatures: Lane Hacker (PA-C)  (Signed on 23-Aug-13 13:08)  Authored  Last Updated: 23-Aug-13 13:08 by Lane Hacker (PA-C)

## 2014-08-31 NOTE — Op Note (Signed)
PATIENT NAME:  Andrea [name], Andrea [name] MR#:  [id number] DATE OF BIRTH:  September 03, 1945  DATE OF PROCEDURE:  01/02/2012  PREOPERATIVE DIAGNOSIS: Symptomatic critical stenosis of the right internal carotid artery.   POSTOPERATIVE DIAGNOSIS: Symptomatic critical stenosis of the right internal carotid artery.   PROCEDURES:  1. Right carotid endarterectomy with CorMatrix patch angioplasty.  2. Repair arterial defect with Xenograft CorMatrix patch.  3.   Placement of catheter in artery with ultrasound guidance; left radial arterial line  SURGEON: Hortencia Pilar, MD  ANESTHESIA: General by endotracheal intubation.   FLUIDS: Per anesthesia record.   ESTIMATED BLOOD LOSS: 100 mL.   SPECIMEN: Plaque to pathology for permanent section.   INDICATIONS: Andrea [name] is a 69 year old woman who presented with facial numbness which correlated with the right carotid distribution. She had recently undergone left carotid endarterectomy and had known carotid disease on the right. Follow-up CT suggested approximately 80% to 85% stenosis and she is therefore undergoing elective right carotid endarterectomy. The risks and benefits were reviewed, all questions answered, alternative therapies were also discussed, and the patient has agreed to proceed with surgery.   DESCRIPTION OF PROCEDURE: The patient is taken to the Operating Room and placed in a supine position. After adequate general anesthesia is induced, ultrasound is placed in a sterile sleeve, and ultrasound is utilized to identify the radial artery and to prevent vascular injury. Left radial artery is noted to be echolucent and pulsatile indicating patency. Image is recorded for the permanent record and under real-time visualization a micropuncture needle is used to access the artery, microwire is followed by micro sheath, and then a 20-gauge Angiocath is advanced over the wire and secured to the wrist, arterial line is then connected, and an excellent waveform is noted.    The patient is then positioned with her neck extended slightly and rotated to the left. The right neck and chest wall are then prepped and draped in a sterile fashion.   A curvilinear incision is then created along the anterior margin of the sternocleidomastoid muscle and carried down through the soft tissues transecting the platysma and ligating the external jugular vein between silk ties. Sternocleidomastoid muscle is identified and the dissection is carried medial to this landmark. The omohyoid muscle is also then identified and the common carotid artery is isolated at this level. Interestingly, the vagus nerve is anterior to the carotid artery and this was carefully dissected and retracted medially. Carotid artery was then dissected in a craniad direction. There were multiple tributaries from the jugular vein which were ligated with silk ties and/or 6-0 Prolene, facial vein was ligated and divided between 2-0 silk ties. Vessel loops were then placed around the superior thyroidal as well as the external carotid. Internal carotid artery was dissected circumferentially to a level above the visible plaque formation. The common carotid artery was dissected circumferentially.   7000 units of heparin was given and allowed to circulate for four minutes.   The common carotid followed by the external followed by the internal carotid artery was then clamped. Arteriotomy was made and extended with Potts scissors into the internal carotid artery and then an indwelling Sundt shunt was placed without difficulty. Flow was reestablished to the brain. Endarterectomy was then performed under direct visualization for the common and internal carotid arteries. External carotid artery was treated with the eversion technique. 7-0 Prolene sutures were used to tack the distal intimal edge within the internal carotid artery and 6-0 interrupted Prolene sutures were used to tack the  proximal intimal edge within the common carotid  artery. CorMatrix patch was then rehydrated on the back table and applied to the arterial defect using running 6-0 Prolene in a four-quadrant technique. Sundt shunt was removed and the suture line was completed after copious irrigation and flow was then reestablished first to the external carotid artery and then the internal carotid artery to prevent distal embolization.   After inspecting the wound and the suture line Surgicel was placed followed by Evicel. The wound was then reapproximated and the platysma closed with running 3-0 Vicryl. Skin was closed with 4-0 Monocryl subcuticular and then Dermabond was applied. The patient tolerated the procedure well. There were no immediate complications, sponge and needle counts were    correct x2, and she was awakened in the Operating Room following simple commands. She will be taken to the Recovery room. She is in stable condition.   ____________________________ Katha Cabal, MD ggs:slb D: 01/02/2012 10:35:05 ET T: 01/02/2012 11:12:33 ET JOB#: [id number]  cc: Katha Cabal, MD, <Dictator> Eduard Clos. Gilford Rile, MD Corey Skains, MD Katha Cabal MD ELECTRONICALLY SIGNED 01/08/2012 10:54

## 2014-08-31 NOTE — Discharge Summary (Signed)
PATIENT NAME:  Andrea [name], Andrea [name] MR#:  [id number] DATE OF BIRTH:  Aug 05, 1945  DATE OF ADMISSION:  01/04/2012 DATE OF DISCHARGE:  01/07/2012  ADMITTING DIAGNOSES:  1. Altered mental status likely due to hypoglycemia as well as hypoxia.  2. Aspiration pneumonia.   DISCHARGE DIAGNOSES:  1. Altered mental status secondary to hypoglycemia, resolved. 2. Hypoglycemia in diabetic patient.  3. Diabetes mellitus, insulin-dependent type 2.  4. Acute respiratory failure.  5. Aspiration pneumonia. 6. Dysphagia after right carotid endarterectomy operation.  7. Congestive heart failure, left heart. 8. Acute pulmonary edema, resolved.  9. Malignant hypertension.  10. History of hypertension, hyperlipidemia, diabetes mellitus, transient ischemic attack, hypothyroidism, diabetic neuropathy, carotid stenosis status post left as well as recent carotid endarterectomy as previously mentioned.   DISCHARGE CONDITION: Stable.   DISCHARGE MEDICATIONS: 1. Continue Omeprazole 40 mg p.o. daily.  2. Levothyroxine 150 mg p.o. daily. 3. Gabapentin 600 mg p.o. twice daily. 4. Zocor 40 mg p.o. at bedtime. 5. Clopidogrel 75 mg p.o. daily.  6. Metoprolol 50 mg p.o. twice daily. 7. Multivitamins once daily. 8. Norvasc 10 mg p.o. daily.  9. Aspirin 81 mg p.o. daily.  10. Vitamin B12, 2,000 mcg p.o. daily.  11. Vitamin D3, 2,000 units daily. 12. Stool softeners 100 mg p.o. at bedtime. 13. Paroxetine 10 mg p.o. daily. 14. NovoLog sliding scale. 15. Novolin 70/30, sixty units subcutaneously twice daily. 16. Percocet 5/325 mg one tablet every six hours as needed.  17. Losartan 50 mg p.o. twice daily. This is a new dose. 18. Catapres 0.1 mg p.o. 3 times daily. This is new dose.  19. Metronidazole 500 mg p.o. every eight hours for five days.  20. Levofloxacin 250 mg p.o. once daily for five more days.   HOME OXYGEN: None.   DIET: 2 grams salt, low fat, low cholesterol, carbohydrate controlled diet.  DIET CONSISTENCY:  Mechanical soft, nectar thick liquids. No straws.   ACTIVITY LIMITATIONS: As tolerated.   REFERRAL: Outpatient speech therapy.   FOLLOWUP:  1. Dr. Ronette Deter in two days after discharge. 2. Dr. Fletcher Anon for outpatient echocardiogram in two days after discharge.  CONSULTANTS: 1. Dr. Hortencia Pilar.  2. Real Cons, PA-C. 3. Care management.   RADIOLOGIC STUDIES: Chest, portable, single view, 01/04/2012 showed no definite evidence of pneumonia. There is mild enlargement of cardiac silhouette which is not new. Follow-up PA and lateral would be of value when the patient can tolerate this procedure, according to the radiologist. CT of head without contrast 01/04/2012: No acute intracranial process. CT of chest for pulmonary embolism with IV contrast 01/04/2012 showing no CT evidence of pulmonary embolus. Interstitial alveolar air space opacities involving left upper and lower lobes most concerning for pneumonia. Recommend follow-up radiography to document complete resolution following adequate medical therapy. If there is not complete resolution, then recommend further evaluation with CT of the chest to exclude an underlying pathology. There is mild esophageal wall thickening involving the proximal thoracic esophagus with right paraesophageal inflammatory change versus adenopathy. Recommend further evaluation with upper endoscopy. MRI of brain without contrast: Chronic white matter disease. No evidence of acute ischemia. Chest portable single view 01/06/2012: Interstitial infiltrate likely representing pulmonary edema. Asymmetric edema versus atelectasis versus focal infiltrate left lung base. Infectious or inflammatory interstitial infiltrate cannot be excluded, if clinically appropriate. Surveillance evaluation is recommended according to the radiologist.   HISTORY: The patient is a 69 year old Caucasian female with past medical history significant for history of recent right CEA who presented to  the  hospital with complaints of decreased mental status, cough, low oxygen saturation as well as low blood glucose levels. Please refer to Dr. Lianne Moris admission note on the 23rd of August 2013 On arrival to the hospital, the patient's temperature was 97.4, pulse was 63, respiratory rate 20, blood pressure 198/69, saturation 99% on oxygen therapy. Physical exam revealed crackles on the left side. Otherwise, the examination was unremarkable.   LABORATORY, RADIOLOGICAL AND DIAGNOSTIC DATA: Lab data showed normal BMP, also low albumin level of 3.3 as well as elevated alkaline phosphatase 245. Otherwise liver enzymes were unremarkable. The patient's cardiac enzymes, only set which was done in the Emergency Room, was normal. Admission CBC showed normal white blood cell count of 9.0, hemoglobin 11.7, platelet count 189. Blood cultures x2 did not show any growth. Urinalysis was unremarkable.   HOSPITAL COURSE: The patient was admitted to the hospital for further evaluation. She was evaluated by Real Cons, PA from vascular surgery who discussed the patient's situation with her husband and apparently realized that the patient was having problems with swallowing liquids. However, denies any speech problems or weakness in extremities. Incision site was without signs of infection and was clean and clear. The patient's husband also stated that the patient had some signs of throat infection. Dr. Delana Meyer who saw the patient in consultation the same day felt that the patient was alert, speaking appropriately and moves all extremities. He did not feel that it was related to stroke. Swallowing study was recommended by medicine and proceeded for further evaluation. Belenda Cruise, speech therapist, evaluated the patient's ability to swallow and recommended for her dysphagia III chopped diet with nectar thick liquids with no straws. She also recommended the patient to have outpatient speech therapy followup, especially after modified  barium swallow study revealing some abnormalities. The patient's diet was changed to dysphagia III with nectar thick liquids and she did quite well. MRI of the brain was performed and it did not show any stroke. It was unclear why the patient had dysphagia. However, swelling of the throat as well as possibly injury of laryngeal nerve was postulated because of those kind of problems. Looking back into her CT scan, it appeared that she did have some changes on her CT scan which was not reported previously by the radiologist. Mild esophageal wall thickening involving the proximal thoracic esophagus as well as right paraesophageal inflammatory change versus adenopathy was found. This needs to be evaluated by endoscopy and we will proceed to discuss the patient's case with one of gastroenterologists so they can schedule an appointment with this patient. While in the hospital the patient was receiving some IV fluids and she developed worsening shortness of breath as well as hypoxia requiring diuresis. With diuresis, as well as antibiotic therapy for her aspiration pneumonitis, the patient's condition improved.   On the day of discharge, 01/07/2012, the patient's temperature is 98.5, pulse 65, respiration rate 18, blood pressure ranging from [id number] to [id number] systolic, oxygen saturation is 96 to 97% on 1 liter of oxygen through nasal cannula at rest and 93% to 95% on room air and remained stable on exertion. It was felt that the patient should continue antibiotic therapy with Levaquin as well as Flagyl and follow up with her primary care physician for further recommendations. As mentioned above, the patient's oxygenation significantly improved. The patient Eilts benefit from gastroenterologic evaluation as an outpatient as opposed to inpatient evaluation due to recent pulmonary infection, as well as congestive heart failure episode. The patient had an  echocardiogram done while she was in the hospital and echocardiogram was  actually remarkably good. The patient's left ventricular systolic function was found to be normal with ejection fraction of more than 55%. There was found to be mild concentric left ventricular hypertrophy. Right ventricular systolic function was also found to be normal. Mild to moderate mitral regurgitation was only noted. It was felt that the patient's congestive heart failure was acute diastolic likely and very likely related to malignant hypertension. The patient had malignant hypertension while she was in the hospital. It was unclear why her blood pressure would continue to rise intermittently quite high. However, it could have been because of IV fluids she received in the hospital. IV fluids were cut down. The patient was receiving Lasix and the patient's blood pressure medications were advanced. The patient had her Losartan advanced to 50 mg twice daily dose. Her clonidine was also advised to 0.1 mg 3 times daily dose instead of twice daily dose. It is recommended to follow the patient's blood pressure readings as outpatient and make sure that her blood pressure readings are stable and improving.  For diabetes mellitus, as mentioned above, the patient initially was hypoglycemic, however, whenever her oral intake improved and she was able to eat with less fear of aspiration, her glucose levels remained high and her insulin was renewed.   For hypothyroidism, the patient is to continue her Synthroid. For diabetic neuropathy, she is to continue Gabapentin. No changes were made here. For hyperlipidemia, the patient is to continue Zocor.   The patient is being discharged in stable condition with the above-mentioned medications and follow-up.   TIME SPENT: 40 minutes.    ____________________________ Theodoro Grist, MD rv:ap D: 01/07/2012 20:48:48 ET T: 01/08/2012 10:32:42 ET JOB#: [id number]  cc: Theodoro Grist, MD, <Dictator> Eduard Clos. Gilford Rile, MD Mertie Clause. Fletcher Anon, MD Theodoro Grist  MD ELECTRONICALLY SIGNED 01/12/2012 15:23

## 2014-09-04 NOTE — Consult Note (Signed)
PATIENT NAME:  Andrea [name], Andrea [name] MR#:  [id number] DATE OF BIRTH:  1945/08/25  DATE OF CONSULTATION:  08/24/2013  CARDIOLOGY CONSULTATION  CONSULTING PHYSICIAN:  Isaias Cowman, MD  PRIMARY CARE PHYSICIAN: Candiss Norse, MD  CARDIOLOGIST: Nehemiah Massed, MD.   CHIEF COMPLAINT: Shortness of breath.   REASON FOR CONSULTATION: Requested for evaluation of congestive heart failure.   HISTORY OF PRESENT ILLNESS: The patient is a 69 year old female with history of hypertension, hyperlipidemia and diabetes. The patient presents with a 1-week history of worsening shortness of breath and peripheral edema. She presented to the [facility] Emergency Room where she was noted to be mildly hypoxic with a BNP of 8000. Chest x-ray revealed cardiomegaly without pulmonary edema. The patient had borderline elevated troponin. She was treated with furosemide 40 mg IV with modest diuresis. The patient was admitted to telemetry and is now referred for further evaluation.   PAST MEDICAL HISTORY: 1.  Hypertension.  2.  Hyperlipidemia.  3.  Diabetes.  4.  Obesity.  5.  Hypothyroidism.   MEDICATIONS: Clonidine 0.1 mg b.i.d., aspirin 81 mg daily, clopidogrel 75 mg daily, amlodipine 10 mg daily, losartan 50 mg daily, atorvastatin 20 mg daily, furosemide 20 mg half tablet b.i.d., metoprolol tartrate 50 mg b.i.d., NovoLog FlexPen sliding scale, gabapentin 600 mg b.i.d., paroxetine 20 mg daily, vitamin B12 1000 mcg daily, stool softener decussate 100 mg daily, cyclobenzaprine 5 mg t.i.d. p.r.n., Ditropan XL 5 mg daily.   SOCIAL HISTORY: The patient is married, lives with her husband. She quit tobacco use 30 years ago.   FAMILY HISTORY: No immediate family history for coronary artery disease or myocardial infarction.  REVIEW OF SYSTEMS:  CONSTITUTIONAL: No fever or chills.  EYES: No blurry vision.  EARS: No hearing loss.  RESPIRATORY: The patient has shortness of breath as described above.  CARDIOVASCULAR: The patient denies chest pain.   GASTROINTESTINAL: No nausea, vomiting or diarrhea.  GENITOURINARY: No dysuria or hematuria.  ENDOCRINE: No polyuria or polydipsia.  MUSCULOSKELETAL: No arthralgias or myalgias.  NEUROLOGICAL: No focal muscle weakness or numbness.  PSYCHOLOGICAL: No depression or anxiety.   PHYSICAL EXAMINATION: VITAL SIGNS: Blood pressure 163/81, pulse 58, respirations 20, temperature 98.1, pulse oximetry 97%.  HEENT: Pupils equal, reactive to light and accommodation.  NECK: Supple without thyromegaly.  LUNGS: Clear.  CARDIOVASCULAR: Normal JVP. Normal PMI. Regular rate and rhythm. Normal S1, S2. No appreciable gallop, murmur or rub.  ABDOMEN: Soft and nontender. Pulses were intact bilaterally.  MUSCULOSKELETAL: Normal muscle tone.  NEUROLOGIC: The patient is alert and oriented x 3. Motor and sensory both grossly intact.   IMPRESSION: A 69 year old female with history of hypertension, hyperlipidemia, diabetes, obesity who presents with congestive heart failure, probable diastolic failure with borderline elevated troponin. I suspect this is due to demand-supply ischemia and not due to acute coronary syndrome. EKG is nondiagnostic.   RECOMMENDATIONS: 1.  Agree with overall current therapy.  2.  Would continue diuresis.  3.  Review 2-D echocardiogram.  4.  Further recommendations pending echocardiogram result, as well as initial clinical course. 5.  Possible functional study, such as York and sestamibi study, which could be done as an outpatient.   ____________________________ Isaias Cowman, MD ap:ce D: 08/24/2013 18:03:01 ET T: 08/24/2013 19:10:56 ET JOB#: [id number]  cc: Isaias Cowman, MD, <Dictator> Isaias Cowman MD ELECTRONICALLY SIGNED 09/15/2013 12:45

## 2014-09-04 NOTE — Discharge Summary (Signed)
Dates of Admission and Diagnosis:  Date of Admission 16-Feb-2014   Date of Discharge 18-Feb-2014   Admitting Diagnosis Bradycardia, diarrhea, hyperkalemia, acute on chronic renal failure, metabolic encephalopathy   Final Diagnosis Bradycardia, diarrhea, hyperkalemia, acute on chronic renal failure, metabolic encephalopathy   Discharge Diagnosis 1 Bradycardia, diarrhea, hyperkalemia, acute on chronic renal failure, metabolic encephalopathy   2 Hypothyroidism   3 Hypertension   4 CHF   5 COPD on home O2   6 Type 2 diabetes    Chief Complaint/History of Present Illness 89 year lady with h/o CHF, hypothyroidism, diabetes and COPD on home O2 presented with diarrhea and altered mental status, noted to be bradycardic with hyperkalemia and acute on chronic renal failure.   Allergies:  Celebrex: Unknown  Sulfa drugs: Itching, GI Distress  Other -Explain in Comment: Rash  Adhesive: Rash  PCN: Other    Thyroid:  05-Oct-15 17:19   Thyroid Stimulating Hormone 1.88 (0.45-4.50 (IU = International Unit)  ----------------------- Pregnant patients have  different reference  ranges for TSH:  - - - - - - - - - -  Pregnant, first trimetser:  0.36 - 2.50 uIU/mL)  06-Oct-15 04:53   Thyroid Stimulating Hormone 1.24 (0.45-4.50 (IU = International Unit)  ----------------------- Pregnant patients have  different reference  ranges for TSH:  - - - - - - - - - -  Pregnant, first trimetser:  0.36 - 2.50 uIU/mL)  Routine Micro:  07-Oct-15 12:32   Micro Text Report STOOL COMPREHENSIVE   COMMENT                   NO SALMONELLA OR SHIGELLA ISOLATED   ANTIBIOTIC                       Micro Text Report CLOSTRIDIUM DIFFICILE   C.DIFFICILE ANTIGEN       C.DIFFICILE GDH ANTIGEN : NEGATIVE   C.DIFFICILE TOXIN A/B     C.DIFFICILE TOXINS A AND B : NEGATIVE   INTERPRETATION            Negative for C. difficile.    ANTIBIOTIC                        Culture Comment NO SALMONELLA OR SHIGELLA  ISOLATED  Result(s) reported on 18 Feb 2014 at 08:43AM.  Cardiology:  05-Oct-15 16:37   Ventricular Rate 42  Atrial Rate 42  P-R Interval 254  QRS Duration 74  QT 460  QTc 384  P Axis 56  R Axis 18  T Axis 24  ECG interpretation Marked sinus bradycardia with 1st degree A-V block Cannot rule out Anterior infarct , age undetermined Abnormal ECG When compared with ECG of 06-Feb-2014 10:21, Vent. rate has decreased BY  35 BPM T wave inversion less evident in Inferior leads T wave amplitude has increased in Lateral leads QT has shortened ----------unconfirmed---------- Confirmed by OVERREAD, NOT ([id number]), editor PEARSON, BARBARA ([id number]) on 02/16/2014 12:08:47 PM  06-Oct-15 06:50   Ventricular Rate 68  Atrial Rate 68  P-R Interval 244  QRS Duration 90  QT 396  QTc 421  P Axis 63  R Axis 57  T Axis 39  ECG interpretation Sinus rhythm with 1st degree A-V block Otherwise normal ECG When compared with ECG of 15-Feb-2014 16:37, Vent. rate has increased BY  26 BPM Confirmed by MORAYATI, SAM ([id number]) on 02/16/2014 11:44:32 AM  Overreader: Hipolito Bayley  Routine Chem:  05-Oct-15 17:19  Creatinine (comp)  1.66  Potassium, Serum  6.1  Ammonia, Plasma < 10 (Result(s) reported on 15 Feb 2014 at 05:58PM.)  06-Oct-15 04:53   Creatinine (comp)  1.50  Potassium, Serum  5.2  Magnesium, Serum  1.4 (1.8-2.4 THERAPEUTIC RANGE: 4-7 mg/dL TOXIC: > 10 mg/dL  -----------------------)  07-Oct-15 05:21   Magnesium, Serum 2.0 (1.8-2.4 THERAPEUTIC RANGE: 4-7 mg/dL TOXIC: > 10 mg/dL  -----------------------)    12:52   Glucose, Serum  212  BUN 17  Creatinine (comp) 1.02  Sodium, Serum 140  Potassium, Serum 4.3  Chloride, Serum  108  CO2, Serum 28  Calcium (Total), Serum 8.7  Anion Gap  4  Osmolality (calc) 287  eGFR (African American) >60  eGFR (Non-African American)  57 (eGFR values <64m/min/1.73 m2 Mccullars be an indication of chronic kidney disease (CKD). Calculated eGFR, using the MRDR  Study equation, is useful in  patients with stable renal function. The eGFR calculation will not be reliable in acutely ill patients when serum creatinine is changing rapidly. It is not useful in patients on dialysis. The eGFR calculation Dye not be applicable to patients at the low and high extremes of body sizes, pregnant women, and vetetarians.)  Cardiac:  06-Oct-15 01:01   Troponin I < 0.02 (0.00-0.05 0.05 ng/mL or less: NEGATIVE  Repeat testing in 3-6 hrs  if clinically indicated. >0.05 ng/mL: POTENTIAL  MYOCARDIAL INJURY. Repeat  testing in 3-6 hrs if  clinically indicated. NOTE: An increase or decrease  of 30% or more on serial  testing suggests a  clinically important change)    04:53   Troponin I < 0.02 (0.00-0.05 0.05 ng/mL or less: NEGATIVE  Repeat testing in 3-6 hrs  if clinically indicated. >0.05 ng/mL: POTENTIAL  MYOCARDIAL INJURY. Repeat  testing in 3-6 hrs if  clinically indicated. NOTE: An increase or decrease  of 30% or more on serial  testing suggests a  clinically important change)    09:31   Troponin I < 0.02 (0.00-0.05 0.05 ng/mL or less: NEGATIVE  Repeat testing in 3-6 hrs  if clinically indicated. >0.05 ng/mL: POTENTIAL  MYOCARDIAL INJURY. Repeat  testing in 3-6 hrs if  clinically indicated. NOTE: An increase or decrease  of 30% or more on serial  testing suggests a  clinically important change)  Routine Hem:  07-Oct-15 05:21   WBC (CBC) 7.2  RBC (CBC) 3.86  Hemoglobin (CBC)  10.5  Hematocrit (CBC)  32.1  Platelet Count (CBC)  131  MCV 83  MCH 27.1  MCHC 32.5  RDW  16.2  Neutrophil % 61.3  Lymphocyte % 25.8  Monocyte % 8.8  Eosinophil % 3.4  Basophil % 0.7  Neutrophil # 4.4  Lymphocyte # 1.9  Monocyte # 0.6  Eosinophil # 0.2  Basophil # 0.0 (Result(s) reported on 17 Feb 2014 at 06:26AM.)   PERTINENT RADIOLOGY STUDIES: XRay:    27-Sep-15 13:04, Chest PA and Lateral  Chest PA and Lateral   REASON FOR EXAM:    follow up  CHF  COMMENTS:       PROCEDURE: DXR - DXR CHEST PA (OR AP) AND LATERAL  - Feb 07 2014  1:04PM     CLINICAL DATA:  Followup CHF.    EXAM:  CHEST  2 VIEW    COMPARISON:  02/06/2014    FINDINGS:  The heart is enlarged. There has been improvement in aeration of the  lung bases. There are no consolidations. Small bilateral pleural  effusions persist. There are degenerative  changes in the spine.  Surgical clips are noted in the upper abdomen.     IMPRESSION:  1. Improved aeration.  2. Cardiomegaly.  3. Small bilateral effusions.      Electronically Signed    By: Shon Hale M.D.    On: 02/07/2014 15:05         Verified By: Glenice Bow, M.D.,  CT:    05-Oct-15 17:45, CT Head Without Contrast  CT Head Without Contrast   REASON FOR EXAM:    SLEEPINESS  COMMENTS:       PROCEDURE: CT  - CT HEAD WITHOUT CONTRAST  - Feb 15 2014  5:45PM     CLINICAL DATA:  Lethargy.  Bradycardia.    EXAM:  CT HEAD WITHOUT CONTRAST    TECHNIQUE:  Contiguous axial images were obtained from the base of the skull  through the vertex without intravenous contrast.    COMPARISON:  Brain MR dated 01/05/2012 and head CT dated 01/04/2012.  FINDINGS:  Diffusely enlarged ventricles and subarachnoid spaces. Patchy white  matter low density in bothcerebral hemispheres. No intracranial  hemorrhage, mass lesion or CT evidence of acute infarction. Small  right sphenoid sinus retention cyst. Unremarkable bones. Bilateral  cavernous internal carotid artery atheromatous calcifications.     IMPRESSION:  1. No acute abnormality.  2. Stable atrophy and chronic small vessel white matter ischemic  changes.      Electronically Signed    By: Enrique Sack M.D.    On: 02/15/2014 18:06         Verified By: Gerald Stabs, M.D.,   Pertinent Past History:  Pertinent Past History COPD on home O2 CHF Hypertension Hypothyroidism Type 2 diabetes   Hospital Course:  Hospital Course 6 year lady  with h/o CHF, hypothyroidism, diabetes and COPD on home O2 admitted for bradycardia, altered mental status, hyperkalemia, diarrhea, acute on chronic renal failure. Bradycardia and mental status improved with supportive care. Hyperkalemia was corrected with Kayexalate. ARB was held for hyperkalemia. Acute on chronic renal failure improved with IV hydration. Normal creatinine of 1.02 today. Stool work up for diarrhea was negative, including C. difficile and stool culture. Diabetes was controlled. Hypothyroidism: normal TSH with current dose of Synthroid. Hypertension: patient was noted to have elevated BP readings during her stay. Hydralazine was increased to 50 mg QID. Clonidine was discontinued to avoid rebound hypertension. Hypertension is better controlled now.  Losartan discontinued for hyperkalemia. CHF remained well compensated with current medications. COPD: she received supplemental oxygen prn. Rest of her usual home medications were continued. Patient is feeling much better overall. Diarrhea has resolved. She is eager to go home. Exam: Alert, NAD, Chest clear, CVS RRR, Abdo: soft nontender, Ext: no edema. Urinalysis is suggestive of UTI. Start Levofloxacin 250 mg daily for 7 days. E-rx sent.   Condition on Discharge Stable   DISCHARGE INSTRUCTIONS HOME MEDS:  Medication Reconciliation: Patient's Home Medications at Discharge:     Medication Instructions  novolog flexpen 100 units/ml subcutaneous solution   subcutaneous 3 times a day (before meals), As Needed per sliding scale   aspirin 81 mg oral tablet  1 tab(s) orally once a day   gabapentin 600 mg oral tablet  1 tab(s) orally 2 times a day   plavix 75 mg oral tablet  1 tab(s) orally once a day   metoprolol tartrate 50 mg oral tablet  1 tab(s) orally 2 times a day   metformin 500 mg oral tablet  1 tab(s)  orally 3 times a day   oxybutynin extended release 15 mg/24 hr oral tablet, extended release  1 tab(s) orally once a day   furosemide 20  mg oral tablet  1 tab(s) orally once a day   paroxetine 40 mg oral tablet  1 tab(s) orally once a day   toujeo solostar 300 units/ml subcutaneous solution  90 unit(s) subcutaneous once a day (at bedtime)   atorvastatin 20 mg oral tablet  1 tab(s) orally once a day (at bedtime)   synthroid 200 mcg (0.2 mg) oral tablet  1 tab(s) orally once a day   omeprazole 40 mg oral delayed release capsule  1 cap(s) orally once a day   hydralazine 50 mg oral tablet  1 tab(s) orally every 8 hours   acetaminophen 325 mg oral tablet  2 tab(s) orally every 4 hours, As needed, mild pain (1-3/10) or temp. greater than 100.4   levaquin 250 mg oral tablet  1 tab(s) orally every 24 hours    PRESCRIPTIONS: ELECTRONICALLY SUBMITTED  STOP TAKING THE FOLLOWING MEDICATION(S):    clonidine 0.1 mg oral tablet: 1 tab(s) orally once a day losartan 100 mg oral tablet: 1 tab(s) orally once a day  Physician's Instructions:  Diet Low Sodium  Carbohydrate Controlled (ADA) Diet   Activity Limitations As tolerated   Return to Work Not Applicable   Time frame for Follow Up Appointment 1-2 weeks  Dr. Dwyane Luo, (Attending Physician): [facility], [address], [place], [place] [address], [place] [phone]  Electronic Signatures: Glendon Axe (MD)  (Signed 08-Oct-15 14:04)  Authored: ADMISSION DATE AND DIAGNOSIS, CHIEF COMPLAINT/HPI, Allergies, PERTINENT LABS, PERTINENT RADIOLOGY STUDIES, PERTINENT PAST HISTORY, HOSPITAL COURSE, [place], PATIENT INSTRUCTIONS, Follow Up Physician   Last Updated: 08-Oct-15 14:04 by Glendon Axe (MD)

## 2014-09-04 NOTE — H&P (Signed)
PATIENT NAME:  Andrea [name], Andrea [name] MR#:  [id number] DATE OF BIRTH:  Apr 18, 1946  DATE OF ADMISSION:  08/23/2013    PRIMARY CARE PHYSICIAN: Glendon Axe, MD   CHIEF COMPLAINT: Shortness of breath.   HISTORY OF PRESENT ILLNESS: This is a 69 year old female with known history of diabetes, hypertension, hyperlipidemia presents with complaints of shortness of breath. Reports it has been going on for 1 week, progressive mainly upon exertion, also denies any orthopnea. As well, reports lower extremity edema, reports some cough. Denies any chest pain or pressure. The patient upon presentation was saturating 92% on room air, appears much more comfortable on oxygen right now.  Reports she has been using her husband's oxygen at home as well. The patient does not have a diagnosis of CHF. Her BNP was elevated in the 8000 range.  Her chest x-ray does show cardiomegaly with vascular congestion but no pulmonary edema. Hospitalist requested to admit the patient for a new onset CHF. The patient was given 40 of IV Lasix in ED with improvements of her symptoms. The patient denies any fever, any chills, any dysuria, polyuria, chest pain, coffee-ground emesis. Reports some cough, but denies any productive sputum. As well, patient was recently at [facility] Emergency Department for accidental ingestion of 14 pills of Synthroid as she took them by mistake. Currently, she is off her Synthroid. She was supposed to have TSH repeated this Monday, and if it is normal, to be resumed back on her medications.   PAST MEDICAL HISTORY:  1. Type 2 diabetes.  2. Hypertension.  3. Hypothyroidism.  4. Hyperlipidemia.  5. Obesity.   ALLERGIES:  CELEBREX, PENICILLIN, SULFA DRUGS, AND ADHESIVES.    HOME MEDICATIONS:  1. Clonidine 0.1 mg oral 2 times a day.  2. NovoLog FlexPen sliding scale.  3. Aspirin 81 mg oral daily.  4. Gabapentin 600 mg 2 times a day.  5. Plavix 75 mg daily.  6. Norvasc 10 mg daily.  7. Losartan 50 mg daily.   8. Atorvastatin 20 mg daily.  9. Paroxetine 20 mg daily.  10. Vitamin B12 1000 mcg oral daily.  11. Stool softener, docusate 100 mg oral daily.  12. Lasix 20 mg half tablet twice a day.  13. Cyclobenzaprine 5 mg 3 times a day as needed.  14. Ditropan XL 5 mg oral daily.  15. Metoprolol tartrate 50 mg oral 2 times a day.  16. Vitamin D3 1000 units 1 capsule oral daily.   FAMILY HISTORY: Significant for hypertension, diabetes, and cancer.   SOCIAL HISTORY: The patient quit smoking 30 years ago. No alcohol. No illicit drug use.   REVIEW OF SYSTEMS:  CONSTITUTIONAL: Denies fever, chills, fatigue, weakness, weight gain, weight loss.  EYES: Denies blurry vision, double vision, inflammation, glaucoma, ear pain, hearing loss, epistaxis or discharge. RESPIRATORY: Denies any wheezing, hemoptysis, or COPD.  Reports shortness of breath and cough. CARDIOVASCULAR: Denies chest pain, arrhythmia, palpitations, syncope. Reports edema.  GASTROINTESTINAL: Denies nausea, vomiting, diarrhea, abdominal pain, hematemesis,  or melena.  GENITOURINARY: Denies dysuria, hematuria, renal colic.  ENDOCRINE: Denies polyuria, polydipsia, heat or cold intolerance.  HEMATOLOGY AND LYMPHATIC: Denies anemia, easy bruising, or bleeding, hematochezia.  INTEGUMENT: Denies acne, rash, or skin lesion.  MUSCULOSKELETAL: Denies any gout, cramps.  NEUROLOGIC: Denies CVA, vertigo, ataxia, dementia, tremors. Has history of TIA.  PSYCHIATRIC: Denies schizophrenia. Has history of anxiety in the past.   PHYSICAL EXAMINATION:  VITAL SIGNS: Temperature 98.2, pulse 56, respiratory rate 20, blood pressure 163/91, saturating 96% on oxygen.  GENERAL: Well-nourished female who looks comfortable and in no apparent distress. HEENT: Head normocephalic. Pupils equal, reactive to light. Pink conjunctivae. Anicteric sclerae. Moist oral mucosa.  NECK: Supple. No thyromegaly. No JVD.  CHEST: Good air entry bilaterally. No wheezing, rales,  rhonchi.  CARDIOVASCULAR: S1, S2 heard. No rubs, murmurs, or gallops.  ABDOMEN: Soft, nontender, nondistended. Bowel sounds present. EXTREMITIES: +1 edema bilaterally. No clubbing. No cyanosis.  PSYCHIATRIC: Appropriate affect. Awake, alert x 3. Intact judgment and insight.  NEUROLOGIC: Cranial nerves grossly intact. Motor 5/5. No focal deficits.  MUSCULOSKELETAL: No joint effusion or erythema.  SKIN: Normal skin turgor. Warm and dry.  LYMPHATIC: No cervical lymphadenopathy appreciated.   PERTINENT LABORATORY DATA: Glucose 165, BNP 8257, BUN 37, creatinine 1.12, sodium 138, potassium 4.5, chloride 108, CO2 of 27. White blood cells 7, hemoglobin 11, hematocrit 34, platelets 176,000. Troponin less than 0.02.   IMAGING DATA:  Chest x-ray showing cardiomegaly with vascular congestion, but no evidence of overt edema.   ASSESSMENT AND PLAN:  1. Congestive heart failure, appears to be new onset CHF. We will check 2-D echocardiogram. The patient will be started on Lasix. We will check daily weights and monitor in and out. We will admit her to telemetry unit, cycle cardiac enzymes and follow the trend. Consult cardiology to evaluate the patient for new onset congestive heart failure.  2. Diabetes mellitus. We will continue with insulin sliding scale.  3. Hypertension: Blood pressure mildly elevated. We will resume back on her home medicine.  4. Hyperlipidemia. Continue with statin.  5. Hypothyroidism. We will check TSH, and if liver is within normal limits, she can be resumed back on her Synthroid.  6. Deep venous thrombosis prophylaxis. Subcutaneous heparin.  7. Gastrointestinal prophylaxis on PPI.   CODE STATUS: Discussed with the patient. Reports she is a full code.   TOTAL TIME SPENT ON ADMISSION AND PATIENT CARE: 50 minutes.     ____________________________ Albertine Patricia, MD dse:dd D: 08/24/2013 04:35:07 ET T: 08/24/2013 05:32:30 ET JOB#: [id number]  cc: Albertine Patricia, MD,  <Dictator> Damonie Ellenwood Graciela Husbands MD ELECTRONICALLY SIGNED 08/25/2013 3:12

## 2014-09-04 NOTE — H&P (Signed)
PATIENT NAME:  Andrea [name], Andrea [name] MR#:  [id number] DATE OF BIRTH:  1945-11-14  DATE OF ADMISSION:  02/06/2014  ADMITTING PHYSICIAN: Gladstone Lighter, MD.   PRIMARY CARE PHYSICIAN: Glendon Axe, MD from [facility].   CHIEF COMPLAINT: Difficulty breathing.   HISTORY OF PRESENT ILLNESS: Ms. Mudrick is a 69 year old Caucasian female with past medical history significant for COPD, on 2 liters home oxygen, diastolic CHF, obstructive sleep apnea, arthritis, diabetes, hypertension, presents to the hospital secondary to worsening breathing and chest tightness that started yesterday morning. The patient also has a history of hiatal hernia and the husband feels like sometimes she chokes on her food, is eating fast, and also had multiple GI symptoms. Two nights before, he felt that she kind of was coughing during her eating. He did not know if it was aspiration or if she just had a worse coughing spell. Yesterday morning she has been having some wheezing though she denied any dyspnea or tachypnea. This morning she got very dyspneic, could not breathe,  hypoxic, and so EMS was called and she was brought to the hospital. Chest x-ray shows pulmonary edema. She is also having wheezing that improved with Lasix, steroids, and also nebs. She is being admitted for CHF and COPD exacerbation.   PAST MEDICAL HISTORY: 1.  Hiatal hernia.  2.  Diabetes mellitus.  3.  Obesity.  4.  COPD, on 2 liters home oxygen.  5.  Obstructive sleep apnea, not on any CPAP.   6.  Diabetes.  7.  Hypertension.  8.  Osteoarthritis.  9.  Chronic back pain.  10.   Hypothyroidism.   PAST SURGICAL HISTORY:  1.  Hysterectomy.  2.  Cholecystectomy.  3.  Right ankle fusion surgery.   ALLERGIES TO MEDICATIONS: CELEBREX, PENICILLIN, AND SULFA DRUGS.   Current Home Medications:  1.  Norvasc 10 mg p.o. daily.  2.  Aspirin 81 mg p.o. daily.  3.  Atorvastatin 20 mg p.o. daily.  4.  Plavix 75 mg p.o. daily.  5.  Clonidine 0.1 mg p.o. b.i.d.   6.  B12 at 1000 mcg p.o. daily.  7.  Flexeril 10 mg p.o. at bedtime p.r.n.  8.  Colace 100 mg p.o. b.i.d.  9.  Lasix 20 mg p.o. daily.  10.   Gabapentin 600 mg p.o. q. 8 hours.  11.   Vicodin 7.5 mg/325 mg 1 tablet p.o. b.i.d. p.r.n. for pain.  12.   Aspart insulin sliding scale.  13.   Glargine insulin 60 units subcutaneous at bedtime.  14.   Levothyroxine 200 mcg p.o. daily.  15.   Losartan 100 mg p.o. daily.  16.   Metoprolol 50 mg p.o. b.i.d.  17.   Multivitamin 1 tablet p.o. daily.  18.   Oxybutynin 10 mg p.o. daily.  19.   Paroxetine 40 mg p.o. daily.  20.   Hydralazine 10 mg p.o. t.i.d.  21.   Metformin 500 mg p.o. b.i.d.   SOCIAL HISTORY: Lives at home with her husband. Quit smoking more than 20 years ago. No alcohol abuse. Uses a wheelchair at baseline and also has a walker. Her pain and also dyspnea limits her walking ability.   FAMILY HISTORY: Dad with heart problems and mom died from complications of COPD emphysema.   REVIEW OF SYSTEMS: CONSTITUTIONAL: Positive for fatigue. No fever or weakness and no weight gain.  EYES: No blurred vision, double vision, inflammation, or glaucoma.  ENT: No tinnitus, ear pain, hearing loss, epistaxis, or discharge.  RESPIRATORY:  Positive for cough, wheeze. No hemoptysis. Positive for COPD.   CARDIOVASCULAR: Positive for chest tightness. No orthopnea, edema, arrhythmia, palpitations, or syncope.  GASTROINTESTINAL: No nausea, vomiting, diarrhea, abdominal pain, hematemesis, or melena.  GENITOURINARY: No dysuria, hematuria, renal calculus, frequency, or incontinence.  ENDOCRINE: No polyuria, nocturia, thyroid problems, heat or cold intolerance.  HEMATOLOGY: No anemia, easy bruising, or bleeding.  SKIN: No acne, rash, or lesions.  MUSCULOSKELETAL: Positive for arthritis and low back pain. No gout.  NEUROLOGICAL: No numbness, weakness, CVA, TIA, or seizures.  PSYCHOLOGIC: No anxiety, insomnia, or depression.   PHYSICAL EXAMINATION: VITAL  SIGNS: Temperature 97.8 degrees Fahrenheit, pulse 77, respirations 24, blood pressure 177/76, pulse oximetry 81% on room air.  GENERAL: Well-built, well-nourished female lying in bed, not in any acute distress.  HEENT: Normocephalic, atraumatic. Pupils equal, round, reacting to light. Anicteric sclerae. Extraocular movements intact. Oropharynx clear without erythema, mass, or exudates.  NECK: Supple. No thyromegaly, JVD, or carotid bruits. No lymphadenopathy.  LUNGS: Moving air bilaterally. She has course crackles posterior lower 1/3 bilaterally. No use of accessory muscles for breathing or rhonchi.  CARDIOVASCULAR: S1 and S2, regular rate and rhythm. No murmurs, rubs, or gallops.  ABDOMEN: Soft, nontender, nondistended. No hepatosplenomegaly. Normal bowel sounds.  EXTREMITIES: Has 2+ pedal edema until the knees. Right ankle difficult to palpate her dorsalis pedis due to her refusing surgery, but the left side 2+ dorsalis pedis pulse possible. No clubbing or cyanosis.  SKIN: No acne, rash, or lesions.  LYMPHATICS: No cervical lymphadenopathy.  NEUROLOGIC: Cranial nerves intact. No focal motor or sensory deficits.  PSYCHOLOGICAL: The patient is awake, alert, oriented x 3.   LABORATORY DATA: WBC 8.9, hemoglobin 10.8, hematocrit 34.6, platelet count 177,000.   Sodium 140, potassium 4.3, chloride 107, bicarbonate 29, BUN 23, creatinine 1.14, glucose 197, calcium 8.4.   ALT 29, AST 30, alkaline phosphatase 139, total bilirubin 0.4, albumin of 3.3. Troponin less than 0.02, BNP 2220. Chest x-ray showing mild congestive heart failure, pulmonary interstitial edema noted, similar to her prior x-ray. EKG showing normal sinus rhythm, first degree AV block, heart rate of 77, no acute ST-T wave abnormalities.   ASSESSMENT AND PLAN: A 69 year old female with a history of chronic obstructive pulmonary disease, congestive heart failure, hypertension, diabetes, admitted for acute on chronic congestive heart failure  exacerbation.  1.  Acute on chronic diastolic congestive heart failure exacerbation, admitted to telemetry, IV Lasix b.i.d. Recent echocardiogram showing normal ejection fraction of 50%. Continue other cardiac home medications. We will need congestive heart failure education prior to discharge as noncompliant with her dietary salt and fluid intake.  2.  Chronic obstructive pulmonary disease exacerbation, again acute on chronic. Continue oxygen support, IV Solu-Medrol, nebulizers and inhalers. No need for antibiotics at this time. Followup chest x-ray in a.m.  3.  Hypertension, continue home medications.  4.  Diabetes, on Lantus and sliding scale.  5.  Obstructive sleep apnea, not on CPAP at home, on 2 liters home oxygen.  6.  Neuropathy, continue her home medications.  7.  Deep vein thrombosis prophylaxis with subcutaneous heparin.   CODE STATUS: Full code.   TIME SPENT ON ADMISSION: 50 minutes.    ____________________________ Gladstone Lighter, MD rk:at D: 02/06/2014 11:49:24 ET T: 02/06/2014 12:11:55 ET JOB#: [id number]  cc: Gladstone Lighter, MD, <Dictator> Glendon Axe, MD Gladstone Lighter MD ELECTRONICALLY SIGNED 02/11/2014 10:08

## 2014-09-04 NOTE — Op Note (Signed)
PATIENT NAME:  Andrea [name], Andrea [name] MR#:  [id number] DATE OF BIRTH:  1945/12/19  DATE OF PROCEDURE:  11/20/2013  PREOPERATIVE DIAGNOSIS: Atherosclerotic occlusive disease bilateral lower extremities, with rest pain of the left lower extremity.   POSTOPERATIVE DIAGNOSIS: Atherosclerotic occlusive disease bilateral lower extremities, with rest pain of the left lower extremity.   PROCEDURES PERFORMED: 1.  Abdominal aortogram.  2.  Left lower extremity distal runoff, 3rd order catheter placement.  3.  Percutaneous transluminal angioplasty to 3 mm of the anterior tibial artery.  4.  Percutaneous transluminal angioplasty to 5 mm of the superficial femoral artery.   SURGEON: Katha Cabal, MD.  SEDATION: Versed 4 mg plus fentanyl 150 mcg administered IV. Continuous ECG, pulse oximetry and cardiopulmonary monitoring was performed throughout the entire procedure by the interventional radiology nurse. Total sedation time was 1 hour 40 minutes.   ACCESS: A 6 French sheath, right common femoral artery.   FLUOROSCOPY TIME: 20.1 minutes.   CONTRAST USED: Isovue 45 mL.   INDICATIONS: Ms. Norsworthy is a 69 year old woman who presents to the office with increasing pain in her left lower extremity. Noninvasive studies as well as physical examination demonstrated significant atherosclerotic occlusive disease. She is, therefore, undergoing angiography with the hope for intervention. Risks and benefits are reviewed. All questions are answered. The patient agrees to proceed.   DESCRIPTION OF PROCEDURE: The patient is taken to special procedures and placed in the supine position. After adequate sedation is achieved, both groins were prepped and draped in sterile fashion. Ultrasound was placed in a sterile sleeve. Ultrasound is utilized secondary to lack of appropriate landmarks and to avoid vascular injury. Under direct ultrasound visualization, the common femoral artery is identified. It is echolucent and compressible  indicating patency. Image is recorded for the permanent record. Under real-time visualization, a micropuncture needle is inserted into the common femoral artery, microwire followed by micro sheath, J-wire followed by a 5 French sheath and 5 French pigtail catheter. The pigtail catheter is positioned at the level of T12 and AP projection of the aorta is obtained. Pigtail catheter is repositioned and an RAO projection of the pelvis is obtained.   Attempts at crossing the bifurcation with the stiff angled Glidewire and pigtail catheter are unsuccessful and therefore rim catheter and the Glidewire are utilized. The pigtail catheter is then exchanged for the rim once the wire is down to the distal external. LAO projection of the femoral is obtained and subsequently the catheter is negotiated into the SFA. Distal runoff is then obtained. Multiple lesions are noted in the proximal half of the SFA ranging from 75% to 90%. There is an odd-looking area that does appear to be consistent with ulcerated plaque and/or localized dissection. Within the mid popliteal just above the level of the tibial plateau, there is 1 cm long string sign. There is a patency of the distal popliteal. The origin of the anterior tibial is diffusely diseased greater than 80% for a distance of approximately 2-3 cm. The peroneal is quite small and posterior tibial is occluded.   Heparin 4000 units is given. A stiff wire is then advanced and initially, an Ansell sheath, but subsequently a Raby sheath are advanced up and over the bifurcation. Wire ultimately is negotiated into the anterior tibial and a 3 x 15 balloon is used to angioplasty the anterior tibial and extended back to predilate the popliteal lesion. After a 3-minute inflation, angiography demonstrates the anterior tibial as well treated with less than 5% residual stenosis. The  popliteal lesion is then treated with a 4 x 4 Lutonix inflated to 12 atmospheres for 3 full minutes. Followup  imaging demonstrates this lesion is well treated. The SFA lesions are then treated with a 5 x 15 Lutonix inflated to 12 atmospheres for 3 full minutes. Followup imaging, both in the AP and oblique demonstrates that there is resolution of these lesions and the anterior tibial lesion is well treated as well. The sheath is then pulled into the external on the right. Oblique view is obtained and a StarClose device deployed without difficulty. There are no immediate complications.   INTERPRETATION: As noted above, there is diffuse disease throughout the aortoiliac system. There is moderate to severe renal artery stenosis noted on the left. There is diffuse disease in the proximal half of the SFA, including multiple greater than 75% stenoses. There is a string sign noted in the popliteal. Anterior tibial has a high-grade lesion at its proximal portion. After treatment to 3 mm in the anterior tibial, 4 mm in the popliteal, and 5 mm in the SFA, there is resolution of these lesions with improved flow dynamics.   SUMMARY: Successful recanalization of the left lower extremity as described above.     ____________________________ Katha Cabal, MD ggs:ds D: 11/21/2013 12:41:00 ET T: 11/21/2013 20:58:36 ET JOB#: [id number]  cc: Katha Cabal, MD, <Dictator> Katha Cabal MD ELECTRONICALLY SIGNED 12/08/2013 12:21

## 2014-09-04 NOTE — H&P (Signed)
PATIENT NAME:  Andrea [name], Andrea [name] MR#:  [id number] DATE OF BIRTH:  [birth year]  DATE OF ADMISSION:  12/15/2013  PRIMARY CARE PHYSICIAN: Dr. Glendon Axe.   CHIEF COMPLAINT: Shortness of breath.   HISTORY OF PRESENT ILLNESS: This is a 69 year old female who presents to the hospital with shortness of breath that began earlier this morning. She describes the shortness of breath as being mostly exertional in nature. but also at rest now. She denies any chest pain, but admits to some chest tightness when she tries to take a deep breath. No nausea, no vomiting. Positive cough. Nonproductive cough. No fevers. No abdominal pain. No diarrhea. No other associated symptoms. The patient presented to the hospital, was noted to be hypoxic with oxygen saturation on room air in the low to mid 80s. She was placed on supplemental oxygen and her oxygen saturations have improved. Upon ambulation, the patient quickly desaturates to the low to mid 80s on minimal exertion. Hospitalist services were contacted for further treatment and evaluation.   REVIEW OF SYSTEMS:  CONSTITUTIONAL: No documented fever. Positive fatigue. No weight gain, no weight loss.  EYES: No blurry or double vision.  ENT: No tinnitus or postnasal drip. No redness of the oropharynx.  RESPIRATORY: Positive cough. No wheeze. No hemoptysis. Positive dyspnea. Positive chronic obstructive pulmonary disease.  CARDIOVASCULAR: No chest pain, no orthopnea, no palpitations or syncope.  GASTROINTESTINAL: No nausea, no vomiting or diarrhea. No abdominal pain. No melena or hematochezia.  GENITOURINARY: No dysuria or hematuria.  ENDOCRINE: No polyuria or nocturia. No heat or cold intolerance.  HEMATOLOGY: No anemia. No bruising. No bleeding.  INTEGUMENTARY: No rashes, no lesions.  MUSCULOSKELETAL: No arthritis, no swelling, no gout.  NEUROLOGIC: No numbness or tingling. No ataxia. No seizure-type activity.  PSYCHIATRIC: No anxiety. No insomnia. No ADD.   PAST  MEDICAL HISTORY: Consistent with diabetes, obesity, obstructive sleep apnea, hypertension, diabetic neuropathy, urinary incontinence, and depression.   ALLERGIES: CELEBREX, PENICILLIN, AND SULFA DRUGS.   SOCIAL HISTORY: Does have a history of tobacco abuse, about 15-20 years, quit in 1985. No alcohol abuse. No illicit drug abuse. Lives at home with her husband.   FAMILY HISTORY: Mother and father are both deceased. Father died from myocardial infarction. Mother died from complications of chronic obstructive pulmonary disease.   CURRENT MEDICATIONS: As follows: Tylenol 650 q. 4 hours as needed, amlodipine 10 mg daily, aspirin 81 mg daily, atorvastatin 20 mg daily, clonidine 0.1 mg t.i.d., Ditropan XL 5 mg daily, Colace 100 mg daily, gabapentin 600 mg b.i.d., hydralazine 10 mg t.i.d. Lasix 20 mg 1/2 tab b.i.d., losartan 50 mg 2 tabs daily, metformin 5 mg t.i.d., metoprolol tartrate 50 mg b.i.d., multivitamin daily, NovoLog Flex pen subcutaneously to be used per sliding scale paroxetine 20 mg daily, Plavix 75 mg daily, vitamin B12 1000 mcg daily, vitamin D3 1000 international units daily.   PHYSICAL EXAMINATION: Presently is as follows:  VITAL SIGNS:  Patient's vital signs are noted to be:  Temperature 98.2, pulse 63, respirations 15, blood pressure 180/71, saturations 100% on 2 liters nasal cannula.  GENERAL: She is a pleasant-appearing female in mild respiratory distress.  HEENT:  Atraumatic, normocephalic. Extraocular muscles are intact. Pupils equal and reactive to light. Sclerae anicteric. No injection. No pharyngeal erythema.  NECK: Supple. There is no jugular venous distention. No bruits, no lymphadenopathy. No thyromegaly.  HEART: Regular rate and rhythm. No murmurs, no rubs, no clicks.  LUNGS: She has some bibasilar crackles. Otherwise, no wheezing, no rhonchi. Negative use of  accessory muscles. No dullness to percussion.  ABDOMEN: Soft, flat, nontender, nondistended. Has good bowel sounds. No  hepatosplenomegaly appreciated.  EXTREMITIES: No evidence of any cyanosis or clubbing. She does have trace pedal edema from the knees to the ankles bilaterally, +2 pedal and radial pulses bilaterally.  NEUROLOGICAL: The patient is alert and oriented x 3 with no focal motor or sensory deficits appreciated bilaterally.  SKIN: Moist and warm with no rashes appreciated.  LYMPHATIC: There is no cervical or axillary lymphadenopathy.   LABORATORY DATA: Serum glucose of 184, BUN 22, creatinine 1.14, sodium 139, potassium 4.4, chloride 103, bicarbonate 27. The patient's troponin less than 0.02. White cell count 7.7, hemoglobin 10.1, hematocrit 31.4, platelet count 207,000.  The patient's ABG showed a pH of 7.4, pCO2 of 46, pO2 of 57, saturations of 90%.   The patient did have a chest x-ray done which shows no acute cardiopulmonary disease, stable cardiomegaly and chronic interstitial coarsening.   ASSESSMENT AND PLAN: A 69 year old female with history of diabetes, obesity, obstructive sleep apnea, hypertension, diabetic neuropathy, urinary incontinence, and depression who presents to the hospital with shortness of breath and noted to be hypoxic.   1.  Hypoxic respiratory failure. This is likely multifactorial in nature unrelated to chronic obstructive pulmonary disease, mild congestive heart failure, and also underlying sleep apnea. The patient will likely need to be assessed for home oxygen as this is likely what she needs. For now for the underlying chronic obstructive pulmonary disease, I will treat her with the oral prednisone taper and start her on some maintenance Spiriva. Continue p.r.n. DuoNeb. Continue diuretics, beta blockers, and losartan for underlying congestive heart failure. The patient also has been diagnosed with sleep apnea, but has not been compliant with her CPAP, and she likely needs to use this at home.   2.  Diabetes. The patient's blood sugars are stable. Continue metformin and sliding  scale insulin.   3.  Chronic obstructive pulmonary disease. As mentioned above, continue prednisone taper and start Spiriva.  Assess her for home oxygen prior to discharge.  4.  Hypertension. Continue metoprolol, Norvasc, losartan, clonidine, and hydralazine. Presently hemodynamically stable.   5.  Depression. Continue Paxil.   6.  Diabetic neuropathy. Continue Neurontin.  7.  Urinary incontinence.  Continue oxybutynin.   The patient will be transferred over to Dr. Kevin Fenton service.   TIME SPENT: 50 minutes.    ____________________________ Belia Heman. Verdell Carmine, MD vjs:ts D: 12/15/2013 14:37:06 ET T: 12/15/2013 15:15:51 ET JOB#: [id number]  cc: Belia Heman. Verdell Carmine, MD, <Dictator> Henreitta Leber MD ELECTRONICALLY SIGNED 12/25/2013 14:27

## 2014-09-04 NOTE — Discharge Summary (Signed)
Dates of Admission and Diagnosis:  Date of Admission 24-Aug-2013   Date of Discharge 27-Aug-2013   Admitting Diagnosis Acute CHF   Final Diagnosis Acute diastolic CHF, hypothyroidism, diabetes   Discharge Diagnosis 1 Acute diastolic CHF   2 Hypothyroidism   3 Diabetes    Chief Complaint/History of Present Illness 69 year old lady presented to the ED c/o sudden onset shortness of breath and swelling of both ankles. h/o hypothyroidism, s/p recent accidental overdose of Synthroid, 14 tablets on 08/18/2013.   Allergies:  Celebrex: Unknown  Sulfa drugs: Itching, GI Distress  Other -Explain in Comment: Rash  Adhesive: Rash  PCN: Other    Thyroid:  12-Apr-15 23:02   Thyroid Stimulating Hormone  0.011 (0.45-4.50 (International Unit)  ----------------------- Pregnant patients have  different reference  ranges for TSH:  - - - - - - - - - -  Pregnant, first trimetser:  0.36 - 2.50 uIU/mL)  15-Apr-15 04:52   Thyroid Stimulating Hormone  0.011 (0.45-4.50 (International Unit)  ----------------------- Pregnant patients have  different reference  ranges for TSH:  - - - - - - - - - -  Pregnant, first trimetser:  0.36 - 2.50 uIU/mL)  Cardiology:  13-Apr-15 14:54   Echo Doppler REASON FOR EXAM:     COMMENTS:     PROCEDURE: [place] - ECHO DOPPLER COMPLETE(TRANSTHOR)  - Aug 24 2013  2:54PM   RESULT: Echocardiogram Report  Patient Name:   Andrea [name] Date of Exam: 08/24/2013 Medical Rec #:  [id number]          Custom1: Date of Birth:  20-Jul-1945       Height:       61.0 in Patient Age:    42 years        Weight:       205.0 lb Patient Gender: F               BSA:          1.91 m??  Indications: CHF Sonographer:    Sherrie Sport RDCS Referring Phys: Charlesetta Ivory, P  Summary:  1. Left ventricular ejection fraction, by visual estimation, is 50 to  55%.  2. Normal global left ventricular systolic function.  3. Moderate left ventricular hypertrophy.  4. Mildly dilated left  atrium.  5. Mildly dilated right atrium.  6. Mild to moderate mitral valve regurgitation.  7. Mildly elevated pulmonary artery systolic pressure.  8. Mild to moderate tricuspid regurgitation.  9. Moderately increased left ventricular posterior wall thickness. 2D AND M-MODE MEASUREMENTS (normal ranges within parentheses): Left Ventricle:          Normal IVSd (2D):      1.34 cm (0.7-1.1) LVPWd (2D):     1.44 cm (0.7-1.1) Aorta/LA:                  Normal LVIDd (2D):     4.37 cm (3.4-5.7) Aortic Root (2D): 2.50 cm (2.4-3.7) LVIDs (2D):     3.10 cm           Left Atrium (2D): 4.40 cm (1.9-4.0) LV FS (2D):     29.1 %   (>25%) LV EF (2D):     56.1 %   (>50%)                                   Right Ventricle:  RVd (2D):        8.12 cm LV DIASTOLIC FUNCTION: MV Peak E: 1.50 m/s E/e' Ratio: 19.00 MV Peak A: 0.87 m/s Decel Time: 239 msec E/A Ratio: 1.72 SPECTRAL DOPPLER ANALYSIS (where applicable): Mitral Valve: MV P1/2 Time: 69.31 msec MV Area, PHT: 3.17 cm?? Aortic Valve: AoV Max Vel: 1.17 m/s AoV Peak PG: 5.5 mmHg AoV Mean PG:  4.5 mmHg LVOT Vmax: 0.73 m/s LVOT VTI: 0.180 m LVOT Diameter: 2.00 cm AoV Area, Vmax: 1.97 cm?? AoV Area, VTI: 1.73 cm?? AoV Area, Vmn: 1.71 cm?? Tricuspid Valve and PA/RV Systolic Pressure: TR Max Velocity: 2.96 m/s RA  Pressure: 5 mmHg RVSP/PASP: 40.0 mmHg Pulmonic Valve: PV Max Velocity: 0.92 m/s PV Max PG: 3.4 mmHg PV Mean PG:  PHYSICIAN INTERPRETATION: Left Ventricle: The left ventricular internal cavity size was normal. LV  posterior wall thickness was moderately increased. Moderate left  ventricular hypertrophy. Global LV systolic function was normal. Left  ventricular ejection fraction, by visual estimation, is 50 to 55%.   Spectral Doppler shows normal pattern of LV diastolic filling. Right Ventricle: The right ventricular size is normal. Left Atrium: The left atrium is mildly dilated. Right Atrium: The right atrium  is mildly dilated. Pericardium: There is no evidence of pericardial effusion. Mitral Valve: Mildto moderate mitral valve regurgitation is seen. Tricuspid Valve: Mild to moderate tricuspid regurgitation is visualized.  The tricuspid regurgitant velocity is 2.96 m/s, and with an assumed right  atrial pressure of 5 mmHg, the estimated right ventricular systolic  pressure is mildly elevated at 40.0 mmHg. Aortic Valve: The aortic valve is normal. Aorta: The aortic root is normal in size and structure.  [place] MD Electronically signed by [id number] Isaias Cowman MD Signature Date/Time: 08/25/2013/12:29:40 PM  *** Final ***  IMPRESSION: .    Verified By: Sheppard Coil . PARASCHOS, M.D., MD  Routine Chem:  12-Apr-15 23:02   Glucose, Serum  165  BUN  37  Creatinine (comp) 1.12  Sodium, Serum 138  Potassium, Serum 4.5  Chloride, Serum  108  CO2, Serum 27  Calcium (Total), Serum 8.5  Anion Gap  3  Osmolality (calc) 288  eGFR (African American)  59  eGFR (Non-African American)  51 (eGFR values <58m/min/1.73 m2 [place] be an indication of chronic kidney disease (CKD). Calculated eGFR is useful in patients with stable renal function. The eGFR calculation will not be reliable in acutely ill patients when serum creatinine is changing rapidly. It is not useful in  patients on dialysis. The eGFR calculation Diskin not be applicable to patients at the low and high extremes of body sizes, pregnant women, and vegetarians.)  13-Apr-15 11:02   B-Type Natriuretic Peptide ([facility]  8257 (Result(s) reported on 24 Aug 2013 at 12:39AM.)  15-Apr-15 04:52   B-Type Natriuretic Peptide ([facility]  [phone](Result(s) reported on 26 Aug 2013 at 06:04AM.)  Cardiac:  12-Apr-15 23:02   Troponin I < 0.02 (0.00-0.05 0.05 ng/mL or less: NEGATIVE  Repeat testing in 3-6 hrs  if clinically indicated. >0.05 ng/mL: POTENTIAL  MYOCARDIAL INJURY. Repeat  testing in 3-6 hrs if  clinically indicated. NOTE: An  increase or decrease  of 30% or more on serial  testing suggests a  clinically important change)  13-Apr-15 11:34   CK, Total 33 (26-192 NOTE: NEW REFERENCE RANGE  06/15/2013)  CPK-MB, Serum 1.1 (Result(s) reported on 24 Aug 2013 at 06:15PM.)  Troponin I < 0.02 (0.00-0.05 0.05 ng/mL or less: NEGATIVE  Repeat testing in 3-6 hrs  if  clinically indicated. >0.05 ng/mL: POTENTIAL  MYOCARDIAL INJURY. Repeat  testing in 3-6 hrs if  clinically indicated. NOTE: An increase or decrease  of 30% or more on serial  testing suggests a  clinically important change)    16:08   CK, Total 31 (26-192 NOTE: NEW REFERENCE RANGE  06/15/2013)  CPK-MB, Serum 1.2 (Result(s) reported on 24 Aug 2013 at 06:15PM.)  Troponin I < 0.02 (0.00-0.05 0.05 ng/mL or less: NEGATIVE  Repeat testing in 3-6 hrs  if clinically indicated. >0.05 ng/mL: POTENTIAL  MYOCARDIAL INJURY. Repeat  testing in 3-6 hrs if  clinically indicated. NOTE: An increase or decrease  of 30% or more on serial  testing suggests a  clinically important change)    20:04   CK, Total 30 (26-192 NOTE: NEW REFERENCE RANGE  06/15/2013)  CPK-MB, Serum 1.2 (Result(s) reported on 24 Aug 2013 at 08:40PM.)  Troponin I < 0.02 (0.00-0.05 0.05 ng/mL or less: NEGATIVE  Repeat testing in 3-6 hrs  if clinically indicated. >0.05 ng/mL: POTENTIAL  MYOCARDIAL INJURY. Repeat  testing in 3-6 hrs if  clinically indicated. NOTE: An increase or decrease  of 30% or more on serial  testing suggests a  clinically important change)  Routine Hem:  12-Apr-15 23:02   WBC (CBC) 7.0  RBC (CBC) 4.05  Hemoglobin (CBC)  11.0  Hematocrit (CBC)  34.0  Platelet Count (CBC) 176 (Result(s) reported on 23 Aug 2013 at 11:20PM.)  MCV 84  MCH 27.2  MCHC 32.4  RDW  15.6   Pertinent Past History:  Pertinent Past History Hypothyroidism Diabetes   Hospital Course:  Hospital Course Patient was treated for acute decompensated CHF, diuresis with Lasix,  monitoring of I/Os, lytes and wt, Losartan, Metoprolol, ASA, statin were continued. Serial troponins were negative. ECHO revelaed EF=50-55%, mod LVH, mild to mod MR and TR. Hypothyroidism: Synthroid was held for low TSH=0.011 and h/o recent accidental overdose of Synthroid, 14 tablets on 08/18/13, TSH remains low=0.011. Repeat TSH as out-patient on Monday. Diabetes was controlled with sliding scale insulin coverage. Patient received physical therapy. Exam: Alert, oriented, NAD. Chest: clear to auscultation. CVS: RRR, no tachycardia. Abdo: soft nontender, + BS, Ext: no ankle edema.  Discharge home with home health, Telehealth nursing and physical therapy.   Condition on Discharge Stable   DISCHARGE INSTRUCTIONS HOME MEDS:  Medication Reconciliation: Patient's Home Medications at Discharge:     Medication Instructions  clonidine 0.1 mg oral tablet  1 tab(s) orally 2 times a day   novolog flexpen 100 units/ml subcutaneous solution   subcutaneous pt states takes 3-15 units three times a day before meals as directed   aspirin 81 mg oral tablet  1 tab(s) orally once a day   gabapentin 600 mg oral tablet  1 tab(s) orally 2 times a day   plavix 75 mg oral tablet  1 tab(s) orally once a day   amlodipine 10 mg oral tablet  1 tab(s) orally once a day   losartan 50 mg oral tablet  1 tab(s) orally once a day   atorvastatin 20 mg oral tablet  1 tab(s) orally once a day   paroxetine 20 mg oral tablet  1 tab(s) orally once a day   vitamin b-12 1000 mcg oral tablet  1 tab(s) orally once a day   docusate sodium sodium 100 mg oral capsule  1 cap(s) orally once a day   lasix 20 mg oral tablet  0.5 tab(s) orally 2 times a day   flexeril  5  mEq/kg orally 3 times a day, As Needed   ditropan xl 5 mg/24 hours oral tablet, extended release  1 tab(s) orally once a day   metoprolol tartrate 50 mg oral tablet  1 tab(s) orally 2 times a day   vitamin d3 1000 intl units oral capsule  1 cap(s) orally once a day    multivitamin  1 cap(s) orally once a day   acetaminophen 325 mg oral tablet  2 tab(s) orally every 4 hours, As needed, pain or temp. greater than 100.4    PRESCRIPTIONS: ELECTRONICALLY SUBMITTED  STOP TAKING THE FOLLOWING MEDICATION(S):    arthrotec 75 mg-200 mcg oral tablet: 1 tab(s) orally 2 times a day  Physician's Instructions:  Diet Low Sodium  Carbohydrate Controlled (ADA) Diet   Activity Limitations As tolerated   Return to Work Not Applicable   Time frame for Follow Up Appointment 1-2 weeks  Dr. Glendon Axe   Time frame for Follow Up Appointment 1-2 weeks  [facility] cardiology   Electronic Signatures: Glendon Axe (MD)  (Signed 16-Apr-15 17:11)  Authored: ADMISSION DATE AND DIAGNOSIS, CHIEF COMPLAINT/HPI, Allergies, PERTINENT LABS, PERTINENT PAST HISTORY, HOSPITAL COURSE, DISCHARGE INSTRUCTIONS HOME MEDS, PATIENT INSTRUCTIONS   Last Updated: 16-Apr-15 17:11 by Glendon Axe (MD)

## 2014-09-04 NOTE — Discharge Summary (Signed)
Dates of Admission and Diagnosis:  Date of Admission 06-Feb-2014   Date of Discharge 09-Feb-2014   Admitting Diagnosis COPD exacerbation and acute diastolic CHF   Final Diagnosis COPD exacerbation and acute diastolic CHF   Discharge Diagnosis 1 COPD exacerbation and acute diastolic CHF   2 Type 2 diabetes   3 Hypertension    Chief Complaint/History of Present Illness 69 year old Caucasian female with COPD, on 2 liters home oxygen, diastolic CHF, obstructive sleep apnea, arthritis, diabetes, hypertension, presented with increased shortness of breath and chest tightness. Initial labs are remarkable elevated BNP of 2220 and negative cardiac enzymes. Initial CXR was consistent with CHF. Patient's dyspnea was felt to be multifactorial, acute diastolic CHF and COPD exacerabation   Allergies:  Celebrex: Unknown  Sulfa drugs: Itching, GI Distress  Other -Explain in Comment: Rash  Adhesive: Rash  PCN: Other    Hepatic:  26-Sep-15 10:24   Bilirubin, Total 0.4  Alkaline Phosphatase  139 (46-116 NOTE: New Reference Range 12/01/13)  SGPT (ALT) 29 (14-63 NOTE: New Reference Range 12/01/13)  SGOT (AST) 30  Total Protein, Serum 6.8  Albumin, Serum  3.3  Cardiology:  26-Sep-15 10:21   ECG interpretation Sinus rhythm with 1st degree A-V block Nonspecific ST abnormality Abnormal ECG When compared with ECG of 15-Dec-2013 11:24, No significant change was found ----------unconfirmed---------- Confirmed by OVERREAD, NOT ([id number]), editor PEARSON, BARBARA ([id number]) on 02/08/2014 8:27:12 AM  Routine Chem:  26-Sep-15 10:24   Glucose, Serum  197  BUN  23  Creatinine (comp) 1.14  Sodium, Serum 140  Potassium, Serum 4.3  Chloride, Serum 107  CO2, Serum 29  Calcium (Total), Serum  8.4  Anion Gap  4  Osmolality (calc) 289  eGFR (African American) >60  eGFR (Non-African American)  50 (eGFR values <17m/min/1.73 m2 [place] be an indication of chronic kidney disease (CKD). Calculated eGFR, using the  MRDR Study equation, is useful in  patients with stable renal function. The eGFR calculation will not be reliable in acutely ill patients when serum creatinine is changing rapidly. It is not useful in patients on dialysis. The eGFR calculation Seats not be applicable to patients at the low and high extremes of body sizes, pregnant women, and vetetarians.)  B-Type Natriuretic Peptide ([facility]  2220 (Result(s) reported on 06 Feb 2014 at 11:05AM.)  Cardiac:  26-Sep-15 10:24   Troponin I < 0.02 (0.00-0.05 0.05 ng/mL or less: NEGATIVE  Repeat testing in 3-6 hrs  if clinically indicated. >0.05 ng/mL: POTENTIAL  MYOCARDIAL INJURY. Repeat  testing in 3-6 hrs if  clinically indicated. NOTE: An increase or decrease  of 30% or more on serial  testing suggests a  clinically important change)  CK, Total 60 (26-192 NOTE: NEW REFERENCE RANGE  06/15/2013)  CPK-MB, Serum 2.0 (Result(s) reported on 06 Feb 2014 at 12:17PM.)  Routine Hem:  26-Sep-15 10:24   WBC (CBC) 8.9  RBC (CBC) 4.13  Hemoglobin (CBC)  10.8  Hematocrit (CBC)  34.6  Platelet Count (CBC) 177 (Result(s) reported on 06 Feb 2014 at 10:55AM.)  MCV 84  MCH 26.2  MCHC  31.3  RDW  16.1   PERTINENT RADIOLOGY STUDIES: XRay:    26-Sep-15 10:31, Chest Portable Single View  Chest Portable Single View   REASON FOR EXAM:    Shortness of Breath  COMMENTS:       PROCEDURE: DXR - DXR PORTABLE CHEST SINGLE VIEW  - Feb 06 2014 10:31AM     CLINICAL DATA:  Difficulty breathing.    EXAM:  PORTABLE CHEST - 1 VIEW    COMPARISON:  12/15/2013.    FINDINGS:  Cardiomegaly with mild pulmonary venous congestion bilateral  interstitial prominence consistent with mild congestive heart  failure. Similar findings noted on prior study of 12/15/2013. No  prominent pleural effusion. No pneumothorax. No acute bony  abnormality.     IMPRESSION:  Findings consistent with mild congestive heart failure and pulmonary  interstitial edema. Similar  findings noted on prior study of  12/15/2013.      Electronically Signed    By: Marcello Moores  Register    On: 02/06/2014 11:11       Verified By: Osa Craver, M.D., MD    27-Sep-15 13:04, Chest PA and Lateral  Chest PA and Lateral   REASON FOR EXAM:    follow up CHF  COMMENTS:       PROCEDURE: DXR - DXR CHEST PA (OR AP) AND LATERAL  - Feb 07 2014  1:04PM     CLINICAL DATA:  Followup CHF.    EXAM:  CHEST  2 VIEW    COMPARISON:  02/06/2014    FINDINGS:  The heart is enlarged. There has been improvement in aeration of the  lung bases. There are no consolidations. Small bilateral pleural  effusions persist. There are degenerative changes in the spine.  Surgical clips are noted in the upper abdomen.     IMPRESSION:  1. Improved aeration.  2. Cardiomegaly.  3. Small bilateral effusions.      Electronically Signed    By: Shon Hale M.D.    On: 02/07/2014 15:05         Verified By: Glenice Bow, M.D.,   Pertinent Past History:  Pertinent Past History COPD on home oxygen Diastolic CHF, Hypertension Obstructive sleep apnea Type 2 diabetes   Hospital Course:  Hospital Course 69 year old Caucasian female with COPD, on 2 liters home oxygen, diastolic CHF, obstructive sleep apnea, arthritis, diabetes and hypertension admitted for dyspnea - likely multifactorial, acute diastolic CHF and COPD exacerabation. Shortness of breath improved with diuresis. BNP of 2220. Normal EF on recent ECHO. Serial cardiac enzymes were negative. Her usual cardiac medication including Metoprolol, Losartan, Norvasc and clonidine were continued. Patient received supplemental oxygen, nebulizer treatments and steroids for COPD exacerbation. No leukocytosis and no infiltrate noted on CXR. Type 2 diabetes: blood sugars were elevated secondary to steroids. She received her usual dose of Levemir 90 units, Metformin 500 mg TID and sliding scale insulin coverage. Hypothyroidism: Synthroid dose was  decreased to 175 mcg daily for low TSH on labs. Patient feels much better overall. Shortness of breath has resolved. She is eager to go home. Shal continue Prednisone taper as out patient. Rest of the medications as prescribed.   Condition on Discharge Stable   DISCHARGE INSTRUCTIONS HOME MEDS:  Medication Reconciliation: Patient's Home Medications at Discharge:     Medication Instructions  novolog flexpen 100 units/ml subcutaneous solution   subcutaneous pt states takes 3-15 units three times a day before meals as directed   aspirin 81 mg oral tablet  1 tab(s) orally once a day   gabapentin 600 mg oral tablet  1 tab(s) orally 2 times a day   plavix 75 mg oral tablet  1 tab(s) orally once a day   amlodipine 10 mg oral tablet  1 tab(s) orally once a day   atorvastatin 20 mg oral tablet  1 tab(s) orally once a day   docusate sodium sodium 100 mg oral capsule  1 cap(s)  orally once a day, As Needed - for Constipation   metoprolol tartrate 50 mg oral tablet  1 tab(s) orally 2 times a day   multivitamin  1 cap(s) orally once a day   acetaminophen 325 mg oral tablet  2 tab(s) orally every 4 hours, As needed, pain or temp. greater than 100.4   metformin 500 mg oral tablet  1 tab(s) orally 3 times a day   clonidine 0.1 mg oral tablet  1 tab(s) orally once a day   oxybutynin extended release 15 mg/24 hr oral tablet, extended release  1 tab(s) orally once a day   hydralazine 10 mg oral tablet  1 tab(s) orally 3 times a day   furosemide 20 mg oral tablet  1 tab(s) orally once a day   losartan 100 mg oral tablet  1 tab(s) orally once a day   paroxetine 40 mg oral tablet  1 tab(s) orally once a day   toujeo solostar 300 units/ml subcutaneous solution  90 unit(s) subcutaneous once a day (at bedtime)   acetaminophen-hydrocodone 300 mg-7.5 mg oral tablet  1 tab(s) orally 2 times a day, As Needed - for Pain   synthroid 175 mcg (0.175 mg) oral tablet  1 tab(s) orally    prednisone 20 mg oral tablet  2  tab(s) orally once a day   prednisone 20 mg oral tablet  1 tab(s) orally once a day   albuterol-ipratropium 2.5 mg-0.5 mg/3 ml inhalation solution  3 milliliter(s) inhaled every 6 hours    PRESCRIPTIONS: ELECTRONICALLY SUBMITTED   Physician's Instructions:  Diet Low Sodium  Carbohydrate Controlled (ADA) Diet   Activity Limitations As tolerated   Return to Work Not Applicable   Time frame for Follow Up Appointment 1-2 weeks  Dr. Dwyane Luo, Ezariah Nace(Attending Physician): [facility], [address] [place], [place] [address], [place] [phone]  Electronic Signatures: Glendon Axe (MD)  (Signed 29-Sep-15 13:58)  Authored: ADMISSION DATE AND DIAGNOSIS, CHIEF COMPLAINT/HPI, Allergies, PERTINENT LABS, PERTINENT RADIOLOGY STUDIES, PERTINENT PAST HISTORY, HOSPITAL COURSE, [place], PATIENT INSTRUCTIONS, Follow Up Physician   Last Updated: 29-Sep-15 13:58 by Glendon Axe (MD)

## 2014-09-04 NOTE — H&P (Signed)
PATIENT NAME:  Andrea [name], Andrea [name] MR#:  [id number] DATE OF BIRTH:  08-Mar-1946  DATE OF ADMISSION:  02/15/2014  PRIMARY CARE PHYSICIAN: Glendon Axe, MD  REQUESTING PHYSICIAN: Lisa Roca, MD   CHIEF COMPLAINT: Altered mental status and bradycardia.   HISTORY OF PRESENT ILLNESS: The patient is a 69 year old female with a known history of diabetes, chronic low back pain and CHF. He is being admitted for bradycardia, altered mental status and hyperkalemia. The patient was at [facility] getting her back evaluated where she was found to have a heart rate in the 40s where her primary care physician was notified who requested them to send her to the Emergency Department. While in the ED, she was found to have potassium of 6.1 with a creatinine of 1.66 and she was dropping her heart rate in the 40s to 50s. She was quite lethargic in the triage and is now being admitted for further evaluation and management.   She reports her thyroid medication dose recently lowered by her primary care physician. She also reports being started on oxycodone for about a month. She reports having loose stools for the last 3-4 days, watery in nature, mainly through the night. Denies any sick contacts.   PAST MEDICAL HISTORY:  1.  Hiatal hernia. 2.  Diabetes mellitus. 3.  Obesity.  4.  COPD, on 2 liters oxygen at home.  5.  Obstructive sleep apnea.  6.  Hypertension. 7.  Osteoarthritis. 8.  Chronic back pain.  9.  Hypothyroidism.   PAST SURGICAL HISTORY:  1.  Hysterectomy. 2.  Cholecystectomy. 3.  Right ankle fusion surgery.   ALLERGIES TO MEDICATION:  1.  CELEBREX.  2.  PENICILLIN. 3.  SULFA DRUGS.  SOCIAL HISTORY: Lives at home with her husband. Quit smoking about 20 years ago. No alcohol use. Uses wheelchair at baseline, also a walker at times. Her pain and dyspnea limits her walking ability.   FAMILY HISTORY: Dad had heart problems. Mother died from complication of COPD and emphysema.    MEDICATIONS AT HOME:  1.  Aspirin 81 mg p.o. daily.  2.  Atorvastatin 20 mg p.o. at bedtime.  3.  Clonidine 0.1 mg p.o. daily.  4.  Lasix 20 mg p.o. daily.  5.  Gabapentin 600 mg p.o. b.i.d.  6.  Hydralazine 10 mg p.o. 3 times a day.  7.  Losartan 100 mg p.o. daily.  8.  Metformin 500 mg p.o. 3 times a day.  9.  Metoprolol 50 mg p.o. b.i.d.  10.  Insulin NovoLog FlexPen subcutaneous 3 times a day as needed.  11.  Omeprazole 40 mg p.o. daily. 12.  Oxybutynin 15 mg p.o. daily.  13.  Paroxetine 40 mg p.o. daily.  14.  Plavix 75 mg daily.  15.  Synthroid 200 mcg p.o. daily.  16.  Toujeo SoloStar 19 units subcutaneous at bedtime.  17.  The patient also reports taking oxycodone although this has not been reported by pharmacy technician.  REVIEW OF SYSTEMS: CONSTITUTIONAL: No fever. Positive fatigue and weakness.  EYES: No blurred or double vision.  EARS, NOSE, AND THROAT: No tinnitus or ear pain.  RESPIRATORY: No cough, wheezing, hemoptysis.  CARDIOVASCULAR: No chest pain, orthopnea, edema. Positive for bradycardia.  GASTROINTESTINAL: No nausea, vomiting. Positive for diarrhea.  GENITOURINARY: No dysuria or hematuria.  ENDOCRINE: No polyuria or nocturia.  HEMATOLOGY: No anemia or easy bruising.  SKIN: No rash or lesion.  MUSCULOSKELETAL: Positive for arthritis and chronic low back pain. No gout.  NEUROLOGIC:  No tingling, numbness, but positive for lethargy for the last day or so. PSYCHIATRIC: No history of anxiety or depression.   PHYSICAL EXAMINATION:  VITAL SIGNS: Temperature 97.2, heart rate 42 per minute, respirations 20 per minute, blood pressure 113/55 mmHg. She is saturating 97% on room air.  GENERAL: The patient is a 69 year old female lying in the bed comfortably without any acute distress.  HEENT: Pupils equal, round, reactive to light and accommodation. No scleral icterus. Extraocular muscles intact. Head atraumatic, normocephalic. Oropharynx and nasopharynx  clear. NECK: Supple. No JVD  LUNGS: Clear to auscultation bilaterally. No wheezing, rales, rhonchi or crepitation.  CARDIOVASCULAR: S1, S2 normal. No murmurs, rubs or gallops. ABDOMEN: Soft, nontender, nondistended. Bowel sounds present. No organomegaly or mass.  EXTREMITIES: No pedal edema, cyanosis, clubbing.  NEUROLOGIC: Cranial nerves II-XII intact. Muscle strength 4/5 in all extremities. Sensation  intact. PSYCHIATRIC: The patient is alert and oriented x 3.  SKIN: No obvious rash, lesion or ulcer.  MUSCULOSKELETAL: She does have minimal tenderness around her lower back.   LABORATORY DATA: Normal CBC except white count 12.0, hemoglobin 11.5. Normal TSH of 1.88. BMP showed sodium 136, potassium 6.1, chloride 108, CO2 of 23, BUN 45, creatinine 1.66, blood sugar 178.   CT scan of the head in the ED showed no acute abnormality. Small vessel chronic white matter changes seen, which are chronic.   EKG shows marked sinus bradycardia with first degree AV block with a rate of 42 per minute, No major ST-T changes.   IMPRESSION AND PLAN:  1.  Sinus bradycardia. We will stop her metoprolol at this time, which could be likely contributing to her bradycardia. We will check a TSH as her Synthroid was recently adjusted (lowered the dose by her primary care physician). We will monitor her on telemetry.  2.  Hyperkalemia. Could be due to worsening renal function with her being on ARB, which has been stopped on this admission. We will monitor her potassium with hydration. There are no EKG changes.  3.  Metabolic encephalopathy. She has been reportedly lethargic at the back doctor's office and also in the triage. We will go ahead and stop the narcotics if she was using any and monitor her closely.  4.  Diarrhea. We will check stool studies including Clostridium difficile. This is likely viral.  5.  Acute on chronic kidney disease stage 3. Likely due to dehydration from diarrhea. We will monitor with IV  hydration.  CODE STATUS: Full code.   TOTAL TIME TAKING CARE OF THIS PATIENT: Fifty-five minutes.    ____________________________ Lucina Mellow. Manuella Ghazi, MD vss:TT D: 02/15/2014 19:50:38 ET T: 02/15/2014 20:09:47 ET JOB#: [id number]  cc: Merlene Dante S. Manuella Ghazi, MD, <Dictator> Glendon Axe, MD Ovid MD ELECTRONICALLY SIGNED 02/16/2014 15:56

## 2014-09-04 NOTE — Discharge Summary (Signed)
Dates of Admission and Diagnosis:  Date of Admission 15-Dec-2013   Date of Discharge 17-Dec-2013   Admitting Diagnosis Acute respiratory failure, hypoxia, COPD   Final Diagnosis Acute respiratory failure, hypoxia, COPD   Discharge Diagnosis 1 COPD   2 Hypertension    Chief Complaint/History of Present Illness 69 year old lady with h/o COPD, HTN and OSA presented with acute shortness of breath and hypoxia. Patient was admitted for acute respiratory failure, hypoxia and COPD exacerbation.   Allergies:  Celebrex: Unknown  Sulfa drugs: Itching, GI Distress  Other -Explain in Comment: Rash  Adhesive: Rash  PCN: Other    Lab:  04-Aug-15 13:55   pH (ABG) 7.40 (7.35-7.45 NOTE: New Reference Range 12/05/13)  PCO2 46 (32-48 NOTE: New Reference Range 12/05/13)  PO2  57 (83-108 NOTE: New Reference Range 12/05/13)  FiO2 21  Base Excess 3.0 (-2.0-3.0 NOTE: New Reference Range 12/05/13)  HCO3  28.5 (21-28 NOTE: New Reference Range 12/05/13)  O2 Saturation 90.4  Specimen Site (ABG) RT BRACHIAL  Specimen Type (ABG) ARTERIAL  Patient Temp (ABG) 37.0 (Result(s) reported on 15 Dec 2013 at 02:08PM.)  Routine Chem:  04-Aug-15 12:04   B-Type Natriuretic Peptide [facility])  1901 (Result(s) reported on 15 Dec 2013 at 01:21PM.)  Glucose, Serum  184  BUN  22  Creatinine (comp) 1.14  Sodium, Serum 139  Potassium, Serum 4.4  Chloride, Serum 103  CO2, Serum 27  Calcium (Total), Serum 8.5  Anion Gap 9  Osmolality (calc) 286  eGFR (African American)  57  eGFR (Non-African American)  49 (eGFR values <88m/min/1.73 m2 Didion be an indication of chronic kidney disease (CKD). Calculated eGFR is useful in patients with stable renal function. The eGFR calculation will not be reliable in acutely ill patients when serum creatinine is changing rapidly. It is not useful in  patients on dialysis. The eGFR calculation [place] not be applicable to patients at the low and high extremes of body sizes,  pregnant women, and vegetarians.)  Cardiac:  04-Aug-15 12:04   Troponin I < 0.02 (0.00-0.05 0.05 ng/mL or less: NEGATIVE  Repeat testing in 3-6 hrs  if clinically indicated. >0.05 ng/mL: POTENTIAL  MYOCARDIAL INJURY. Repeat  testing in 3-6 hrs if  clinically indicated. NOTE: An increase or decrease  of 30% or more on serial  testing suggests a  clinically important change)  Routine Hem:  04-Aug-15 12:04   WBC (CBC) 7.7  RBC (CBC) 3.80  Hemoglobin (CBC)  10.0  Hematocrit (CBC)  31.4  Platelet Count (CBC) 207 (Result(s) reported on 15 Dec 2013 at 12:18PM.)  MCV 83  MCH 26.4  MCHC  31.9  RDW  15.7   PERTINENT RADIOLOGY STUDIES: XRay:    04-Aug-15 11:50, Chest Portable Single View  Chest Portable Single View   REASON FOR EXAM:    SOB  COMMENTS:       PROCEDURE: DXR - DXR PORTABLE CHEST SINGLE VIEW  - Dec 15 2013 11:50AM     CLINICAL DATA:  Shortness of breath.    EXAM:  PORTABLE CHEST - 1 VIEW    COMPARISON:  Two-view chest 08/23/2013    FINDINGS:  Cardiomegaly is stable. Mild interstitial coarsening is unchanged.  Atherosclerotic calcifications are noted in the aortic arch. No  focal airspace disease is evident.     IMPRESSION:  1. No acute cardiopulmonary disease or significant interval change.  2. Stable cardiomegaly and chronic interstitial coarsening.      Electronically Signed    By: CGerald Stabs  Mattern M.D.    On: 12/15/2013 12:27         Verified By: Resa Miner. MATTERN, M.D.,   Pertinent Past History:  Pertinent Past History COPD OSA HTN,  Hypertensive heart disease, +LVH, EF=50-55% on recent ECHO   Hospital Course:  Hospital Course 69 year old lady admitted with acute respiratory failure, hypoxia and COPD exacerbation. She received supplemental O2, Duoneb treatments, Prednisone and Spiriva. Admission CXR was NAPD. Shortness of breath and hypoxia improved. h/o Hypertensive heart disease, +LVH, EF=50-55% on recent ECHO. Lasix, Losartan,  lopressor and Amlodipine were continued. Hydralazine was increased to 25 mg TID for elevated blood pressure. h/o OSA, encouraged to use CPAP every night. Patient feels much better overall. She is still requiring O2 by n/c at 2 Litres/min. Shall discharge home with O2 n/c at 2 Litres/min.   Condition on Discharge Stable   DISCHARGE INSTRUCTIONS HOME MEDS:  Medication Reconciliation: Patient's Home Medications at Discharge:     Medication Instructions  novolog flexpen 100 units/ml subcutaneous solution   subcutaneous pt states takes 3-15 units three times a day before meals as directed   aspirin 81 mg oral tablet  1 tab(s) orally once a day   gabapentin 600 mg oral tablet  1 tab(s) orally 2 times a day   plavix 75 mg oral tablet  1 tab(s) orally once a day   amlodipine 10 mg oral tablet  1 tab(s) orally once a day   atorvastatin 20 mg oral tablet  1 tab(s) orally once a day   paroxetine 20 mg oral tablet  1 tab(s) orally once a day   vitamin b-12 1000 mcg oral tablet  1 tab(s) orally once a day   docusate sodium sodium 100 mg oral capsule  1 cap(s) orally once a day   lasix 20 mg oral tablet  0.5 tab(s) orally 2 times a day   ditropan xl 5 mg/24 hours oral tablet, extended release  1 tab(s) orally once a day   metoprolol tartrate 50 mg oral tablet  1 tab(s) orally 2 times a day   vitamin d3 1000 intl units oral capsule  1 cap(s) orally once a day   multivitamin  1 cap(s) orally once a day   acetaminophen 325 mg oral tablet  2 tab(s) orally every 4 hours, As needed, pain or temp. greater than 100.4   losartan 50 mg oral tablet  2 tab(s) orally once a day   metformin 500 mg oral tablet  1 tab(s) orally 3 times a day   levothyroxine 200 mcg (0.2 mg) oral tablet  1 tab(s) orally once a day   clonidine 0.1 mg oral tablet  1 tab(s) orally once a day   hydralazine 25 mg oral tablet  1 tab(s) orally 4 times a day   prednisone 20 mg oral tablet  1 tab(s) orally once   prednisone 10 mg oral tablet   1 tab(s) orally once   tiotropium 18 mcg inhalation capsule  1 cap(s) inhaled once a day    PRESCRIPTIONS: ELECTRONICALLY SUBMITTED   Physician's Instructions:  Diet Low Sodium   Activity Limitations As tolerated   Return to Work Not Applicable   Time frame for Follow Up Appointment 1-2 weeks  Dr. Glendon Axe   Time frame for Follow Up Appointment 1-2 weeks  [facility]     Candiss Norse, Eugine Bubb(Attending Physician): [facility], [address], [place], [place] [address], [place] [phone]  Electronic Signatures: Glendon Axe (MD)  (Signed 06-Aug-15 14:14)  Authored: ADMISSION DATE AND DIAGNOSIS, CHIEF COMPLAINT/HPI, Allergies, PERTINENT LABS, PERTINENT RADIOLOGY STUDIES, PERTINENT PAST HISTORY, HOSPITAL COURSE, DISCHARGE INSTRUCTIONS HOME MEDS, PATIENT INSTRUCTIONS, Follow Up Physician   Last Updated: 06-Aug-15 14:14 by Glendon Axe (MD)

## 2014-09-05 NOTE — Op Note (Signed)
PATIENT NAME:  Andrea [name], Andrea [name] MR#:  [id number] DATE OF BIRTH:  Aug 27, 1945  DATE OF PROCEDURE:  10/10/2011  PREOPERATIVE DIAGNOSIS: Critical stenosis of the left carotid artery.   POSTOPERATIVE DIAGNOSIS: Critical stenosis of the left carotid artery.   PROCEDURES PERFORMED: 1. Left carotid endarterectomy with CorMatrix patch angioplasty.  2. Repair of arterial defect with xenograft CorMatrix patch.   SURGEON: Katha Cabal, MD  ANESTHESIA: General by endotracheal intubation.   FLUIDS: Per anesthesia record.   ESTIMATED BLOOD LOSS: 100 mL.   SPECIMEN: Carotid plaque to pathology for permanent section.   INDICATIONS: Andrea [name] is a 69 year old woman who presented to the office after duplex ultrasound demonstrated stenosis of the carotid arteries. Subsequently CT angiography demonstrated greater than 90% stenosis. After further work-up and evaluation carotid endarterectomy was recommended. Risks, benefits as well as alternative therapies were reviewed with the patient in great detail. All questions were answered. Patient has agreed to proceed with surgery.   DESCRIPTION OF PROCEDURE: Patient is taken to the Operating Room, placed in the supine position. After adequate invasive monitors are placed and general anesthesia is induced, she is positioned supine with her neck extended and rotated to the right. Neck and chest wall on the left side are then prepped and draped in sterile fashion.   Curvilinear incision is made along the anterior margin of the sternocleidomastoid muscle and carried down through soft tissues transecting the platysma with Bovie cautery. External jugular vein is ligated and divided between 2-0 silk ties. Sternocleidomastoid muscle is then reflected posterolaterally. Jugular vein is identified and the lymphatic fat pad is reflected medially exposing the common carotid artery at the level of the omohyoid. Dissection is then carried in the craniad direction ligating venous  tributaries as well as the facial vein with 3-0 and 2-0 silk ties. The external carotid artery is looped with a silastic vessel loop. The superior thyroidal is looped with a Silastic vessel loop. 7000 units of heparin is given and allowed to circulate. Dissection of the internal carotid artery is carried to a level above the visible plaque formation. Circumferential dissection of the common carotid is also performed.   The common carotid followed by the external, followed by the internal carotid artery is clamped. Arteriotomy is made, extended with Potts scissors, and an indwelling Sundt shunt is placed without difficulty. Flow is then re-established to the brain. Endarterectomy is then performed under direct visualization for the distal common carotid bulb and proximal internal carotid artery. External carotid artery is treated with the eversion technique. Interrupted 7-0 Prolene suture is used to tack the distal intimal edge within the internal carotid artery and interrupted 6-0 Prolene suture is used to tack the proximal intimal edge within the common carotid artery.   CorMatrix patch is then hydrated on the back table. It is beveled to an appropriate shape and then applied to the arterial defect for repair of the artery using running 6-0 Prolene in a four-quadrant technique. Flushing maneuvers are performed after copious irrigation of the endarterectomy site and the suture line is completed. Flow is then re-established first to the external carotid artery and then the internal carotid artery to prevent distal embolization. Small bleeding area along the suture line of the patch is easily controlled with a single interrupted Prolene suture. Arixtra powder and Surgicel are then placed and light pressure is held for several minutes. The wound is then irrigated, inspected and then closed in layers using 3-0 running Vicryl for the platysma and 4-0 Monocryl  for the skin in a subcuticular fashion. Dermabond is  applied.   The patient tolerated procedure well. There were no immediate complications. She was awakened in the Operating Room, moving all extremities and obeying simple commands. She is taken to the recovery area in excellent condition.  ____________________________ Katha Cabal, MD ggs:cms D: 10/10/2011 [id number] ET T: 10/10/2011 14:44:10 ET JOB#: [id number]  cc: Katha Cabal, MD, <Dictator> Eduard Clos. Gilford Rile, MD Katha Cabal MD ELECTRONICALLY SIGNED 10/12/2011 9:03

## 2014-09-05 NOTE — Discharge Summary (Signed)
PATIENT NAME:  Andrea [name], LABORIN MR#:  [id number] DATE OF BIRTH:  [birth year]  DATE OF ADMISSION:  11/16/2011 DATE OF DISCHARGE:  11/17/2011  ADMITTING DIAGNOSIS: Slurred speech, left lower extremity weakness.   DISCHARGE DIAGNOSES:  1. Slurred speech, left-sided weakness likely due to transient ischemic attack.  2. Accelerated hypertension.  3. Diabetes, uncontrolled.  4. Hyperlipidemia.  5. Hypothyroidism.  6. Diabetic neuropathy.  7. Recent left carotid endarterectomy 10/10/2011.  8. Right-sided carotid stenosis, needs further outpatient evaluation and carotid endarterectomy, status post evaluation by Dr. Lucky Cowboy.  PERTINENT LABS AND EVALUATIONS: During this hospitalization CT scan of the head shows no acute intracranial processes. The patient had an MRI on 06/03 which showed no evidence of acute ischemic infarct. There are nonspecific white matter signal changes. The patient had a CT angiograph of the neck, which showed an area of high-grade, hemodynamically significant stenosis within the proximal right internal carotid artery extending into the bulb.   CONSULTANT: Dr. Clarene Reamer COURSE: Please refer to the History and Physical done by the admitting physician. The patient is a pleasant 69 year old white female with known history of carotid artery disease,  poorly controlled hypertension, and diabetes who had a recent endarterectomy at the end of Z[place] on the left side. The patient was actually admitted on 07/02, presented with slurred speech. At that time work-up included MRI of the brain which showed no acute stroke. She was noted to have a right-sided carotid artery stenosis. The patient was discharged. She presented back with the same symptoms of left lower extremity weakness, slurred speech, and also her blood pressure was systolic [id number]. We were asked to admit the patient. The patient's symptoms resolved very quickly after being in the Emergency Department. Due to recurrence of her symptoms we  were asked to admit the patient. The patient was admitted due to her right carotid artery stenosis. A consult was also obtained with Dr. Lucky Cowboy of vascular.  He felt that due to her recent surgery,  immediate right carotid endarterectomy would not be performed. He did recommend a CT angiogram of the neck which confirmed high-grade stenosis. He recommended the patient be placed on Plavix and aspirin added. She will be seen by Dr. Delana Meyer who had recently done her surgery. At that point she will need to make arrangements for right-sided carotid enterectomy. At this time the patient is doing much better. Her symptoms have resolved and she is stable for discharge.   DISCHARGE MEDICATIONS:  1. Omeprazole 40 mg 1 tab p.o. daily.  2. Vitamin D3 1000 international units 1 tab p.o. daily.  3. Vitamin B12 1000 mcg daily.  4. Levothyroxine 150 mcg daily. 5. 70/30, 50 units subcutaneous b.i.d.  6. Metoprolol tartrate 50, 1 tab p.o. daily.  7. Multivitamin 1 tab p.o. daily.  8. Gabapentin 600, 1 tab p.o. b.i.d.  9. Zocor 40 mg at bedtime.  10. Norvasc 10 daily.  11. Lisinopril 40, 1 tab p.o. daily.  12. Enteric-coated aspirin 81 mg, 1 tab p.o. daily.  13. Catapres 0.1, 1 tab p.o. b.i.d.  14. Plavix 75 p.o. daily.   DIET: Low sodium ADA diet, low fat.   ACTIVITY: As tolerated.   RETURN TO WORK: Not applicable.  FOLLOWUP:  Follow up in one to two weeks with Dr. Ronette Deter, one to two weeks with Dr. Delana Meyer.   TIME SPENT: 35 minutes. ____________________________ Lafonda Mosses Posey Pronto, MD shp:bjt D:  11/18/2011 08:21:41 ET  T: 11/19/2011 13:34:48 ET         JOB#: [id number]  cc: Anderson Malta A. Gilford Rile, MD Alric Seton MD ELECTRONICALLY SIGNED 11/20/2011 8:24

## 2014-09-05 NOTE — Discharge Summary (Signed)
PATIENT NAME:  Andrea [name], Andrea [name] MR#:  [id number] DATE OF BIRTH:  [birth year]  DATE OF ADMISSION:  11/13/2011 DATE OF DISCHARGE:  11/15/2011  DIAGNOSES: 1. Possible transient ischemic attack. 2. Accelerated hypertension. 3. Diabetes, uncontrolled. 4. Hyperlipidemia. 5. Hypothyroidism. 6. Diabetic neuropathy.  7. Recent left carotid endarterectomy on 10/10/2011.   DISPOSITION: The patient is being discharged home.   FOLLOWUP: Follow up with primary care physician, Dr. Gilford Rile, in 1 to 2 weeks after discharge.   DIET: Low-sodium, 1800 calorie, low-fat, low-cholesterol diet.   ACTIVITY: As tolerated.   DISCHARGE MEDICATIONS:  1. Aspirin 325 mg daily.  2. Neurontin 600 mg b.i.d.  3. Zocor 40 mg daily.  4. Norvasc 10 mg daily.  5. Lopressor 50 mg once a day  6. MiraLAX as needed for constipation.  7. Multivitamin 1 tablet daily.  8. NovoLog 70/30 insulin sliding scale. 9. NovoLog 70/30 at 15 units subcutaneously b.i.d.  10. Synthroid 150 mcg daily.  11. Vitamin B12 at 1000 mcg daily.    12. Vitamin D3 at 1000 international units daily.  13. Lisinopril 40 mg b.i.d. 14. Omeprazole 40 mg daily.   RESULTS: MRI of the brain showed extensive chronic small-vessel white-matter ischemic changes. No evidence of acute stroke. CT head negative. Urinalysis showed no evidence of infection. White count normal. CBC normal. The patient has severe hyperlipidemia with elevated VLDL, LDL, cholesterol, triglyceride, and low HDL. Cardiac enzymes negative. Electrolytes normal. BUN and creatinine normal.   HOSPITAL COURSE:  1. The patient is a 69 year old female with past medical history of diabetes, hypertension, hypothyroidism, peripheral neuropathy, peripheral vascular disease status post left carotid endarterectomy on 10/10/2011 who presented with headaches, slurred speech, and right upper extremity weakness. Subsequently she went back to be at her baseline. There was a suspicion of coronary artery disease;  therefore, she was admitted to the hospital. Initial CAT scan showed no acute abnormalities. A subsequent MRI of the brain showed extensive small-vessel white-matter ischemic changes but no acute infarct. The dose of her aspirin has been increased to 325 mg daily. The patient had accelerated hypertension on admission. She is on a beta blocker and ACE inhibitor. She was also started on Norvasc for better control.  2. Diabetes was in control on admission, became better controlled during the hospitalization. The patient has been extensively counseled about diet and lifestyle modifications. A fasting lipid profile revealed that patient had hyperlipidemia with elevated VLDL, LDL, triglycerides, and cholesterol; and she has been initiated on statin therapy. She was back at her baseline and is being discharged home in a stable condition.   TIME SPENT: 45 minutes.    ____________________________ Cherre Huger, MD sp:vtd D: 11/15/2011 13:22:13 ET T: 11/16/2011 12:54:41 ET JOB#: [id number]  cc: Cherre Huger, MD, <Dictator> Eduard Clos. Gilford Rile, MD Cherre Huger MD ELECTRONICALLY SIGNED 11/16/2011 14:17

## 2014-09-05 NOTE — H&P (Signed)
PATIENT NAME:  Andrea [name], Andrea [name] MR#:  [id number] DATE OF BIRTH:  1945-11-23  DATE OF ADMISSION:  11/13/2011  ADDENDUM:  The patient is allergic to Celebrex, sulfa, and penicillin.  ____________________________ Epifanio Lesches, MD sk:slb D: 11/13/2011 21:38:55 ET T: 11/14/2011 07:05:14 ET JOB#: [id number]  cc: Epifanio Lesches, MD, <Dictator> Epifanio Lesches MD ELECTRONICALLY SIGNED 11/20/2011 10:06

## 2014-09-05 NOTE — H&P (Signed)
PATIENT NAME:  Andrea [name], Andrea [name] MR#:  [id number] DATE OF BIRTH:  01-Nov-1945  DATE OF ADMISSION:  11/13/2011  PRIMARY CARE PHYSICIAN: Ronette Deter, MD   ER PHYSICIAN: Dr. Reita Cliche    CHIEF COMPLAINT: Headache and slurred speech.   HISTORY OF PRESENT ILLNESS: The patient is a 69 year old female with history of hypertension, diabetes, carotid surgery recently on the left side with left carotid endarterectomy who was brought in by her husband because the patient was having severe headache around 5 o'clock this evening. The patient was also noted to have slurred speech, gargled speech, unable to get the words out. That continued for some time and because of the headache and slurred speech husband brought her to the Emergency Room. The patient refused to come by ambulance so he had to drive her and was seen in the Emergency Room. The patient says that she had very bad headache and slurred speech today and facial numbness was also noted a few days ago on the left side. No other weakness. The patient was noted to have slight weakness on the right side. According to husband, she walks towards the left side for a long time. Has no incontinence. Did not lose consciousness. The patient had no seizures. The patient also had severe headache but no blurred vision. No neck pain. According to husband, the patient had facial numbness on the left side for the past three days and around two weeks ago had aphasia and that actually subsided within a few minutes. The patient did not want to come to the Emergency Room. Husband called EMS and the patient refused to come to the Emergency Room at that time.   PAST MEDICAL HISTORY: 1. Hypertension.  2. Diabetes. 3. Peripheral vascular disease. 4. Diabetic neuropathy.  5. She was admitted on [place] 29th. She had surgery on the 29th for left carotid artery endarterectomy and was discharged after left carotid artery endarterectomy.   FAMILY HISTORY: Mother's side has hypertension and  diabetes.    PAST SURGICAL HISTORY:  1. Left carotid surgery. 2. Total abdominal hysterectomy. 3. Gallbladder surgery.   ALLERGIES: She is allergic to penicillin and sulfa.   SOCIAL HISTORY: No smoking, no drinking, no drugs. Lives with her husband.   MEDICATIONS:  1. Aspirin 81 mg p.o. daily.  2. Gabapentin 800 mg p.o. t.i.d.  3. Levothyroxine 150 mcg p.o. daily.  4. Lisinopril 40 mg p.o. b.i.d.  5. Metoprolol 50 mg p.o. daily.  6. Multivitamin 1 tablet p.o. daily.  7. Novolin 70/30 50 units b.i.d.  8. NovoLog 70/30. 9. Sliding scale.  10. Multivitamin. 11. Vitamin D. 12. Vitamin B12.   REVIEW OF SYSTEMS: CONSTITUTIONAL: Right now has some headache. EYES: No blurred vision. ENT: No tinnitus. No epistaxis. No difficulty swallowing. RESPIRATORY: Denies any trouble breathing. CARDIOVASCULAR: No chest pain. No palpitations. GI: No nausea. No vomiting. No abdominal pain. HEMATOLOGIC: No easy bruising. INTEGUMENTARY: No skin rashes. MUSCULOSKELETAL: No joint pains. NEUROLOGIC: Has headache and right-sided weakness and slurred speech.   PHYSICAL EXAMINATION:   VITAL SIGNS: Temperature 97.8, pulse 64, respirations 16, initial blood pressure 213/90, repeat  222/81. During my visit the blood pressure was 160/70 and pulse was 71.   GENERAL: The patient feels slightly better. Her headache is better.  HEENT: Atraumatic, normocephalic. Pupils equally reacting to light. Extraocular movements intact. The patient has no tympanic membrane congestion. No turbinate hypertrophy. No oropharyngeal erythema.   NECK: Normal range of motion. No JVD. Left carotid artery endarterectomy scar present.  RESPIRATORY: Clear to auscultation. No wheeze. No rales.   CARDIOVASCULAR: S1 and S2 regular. No murmurs.   ABDOMEN: Soft, nontender, nondistended. Bowel sounds present.   MUSCULOSKELETAL: Strength is slightly on the low side on the right side well. Gait not tested because of headache.   SKIN: No  skin rashes.   NEUROLOGIC: The patient is oriented to time, place, person. Cranial nerves II through XII intact. Deep tendon reflexes 2+ bilaterally. Sensations are slightly decreased on the left cheek. The patient has no dysarthria at this time but husband told me that this evening she clearly had dysarthria. Power 5/5 left upper and lower extremities, around 3/5 in the right upper extremity and right lower extremity.   PSYCH: Oriented to time, place, and person.   LABORATORY DATA: WBC 8.8, hemoglobin 12.7, hematocrit 38.1, platelets 195. Electrolytes sodium 135, potassium 4.4, chloride 99, bicarb 28, BUN 24, creatinine 1.07, glucose 347. Troponin 0.02. INR 0.9.   Head CT showed no acute intracranial process.   EKG showed normal sinus with first degree AV block, 71 beats per minute. No ST-T changes.   ASSESSMENT AND PLAN:  1. This is a 69 year old female patient who came to the Emergency Room around 6 p.m. The patient was noted to have stroke symptoms at 5 p.m. Neurology was consulted because of the window of less than hour for stroke symptoms. The patient is having headache and slurred speech and tele neurologist recommended no TPA because she did have some transient aphasia a couple of weeks ago and had noticed left-sided numbness recently and came in because of significant weakness. The patient is not a candidate for TPA because of symptom duration outside the TPA window. Also, her blood pressure is elevated. The patient is going to be admitted to telemetry for hypertensive urgency and stroke symptoms. The patient already received labetalol 20 mg in the Emergency Room because of hypertensive emergency and right now blood pressure is better. She received around 60 mg of labetalol so far. We will use labetalol p.r.n. 10 mg every four hours as needed. Target blood pressure will be around 160/90 to help with permissive hypertension for perfusion. I'll continue the lisinopril and metoprolol at this time.  Get speech therapy and swallow evaluation. She already had a CT of the neck in March showing high-grade stenosis bilaterally. Had a left carotid artery endarterectomy so we will not repeat that. Instead, continue the aspirin and also statins. Obtain MRI of the brain along with echocardiogram. The patient right now is able to swallow. We will give her pureed diet and get swallow evaluation tomorrow and continue aspiration precautions. We will check fasting lipase, hemoglobin A1c tomorrow. 2. Diabetes mellitus, type II. Continue home dose insulin. 3. Hypothyroidism. Continue home medications.  4. History of diabetic neuropathy. Continue gabapentin.    The patient is going to be admitted to telemetry.   TIME SPENT: About 60 minutes.  ____________________________ Epifanio Lesches, MD sk:drc D: 11/13/2011 21:35:36 ET T: 11/14/2011 07:12:34 ET JOB#: [id number]  cc: Epifanio Lesches, MD, <Dictator> Eduard Clos. Gilford Rile, MD Epifanio Lesches MD ELECTRONICALLY SIGNED 11/20/2011 10:06

## 2014-09-05 NOTE — H&P (Signed)
PATIENT NAME:  Andrea [name], DARLING MR#:  [id number] DATE OF BIRTH:  11/25/1945  DATE OF ADMISSION:  11/16/2011  PRIMARY CARE PHYSICIAN: Ronette Deter, MD  HISTORY OF PRESENT ILLNESS: This is a 69 year old female presenting with a history of hypertension, diabetes, and carotid artery disease with left-sided carotid endarterectomy who presents because of worsening left lower extremity weakness. The patient was recently admitted, on 07/02 and discharged yesterday because of garbled speech.  Symptoms were self-limited and she was found not to be a candidate for TPA. The patient had a similar episode this evening postdischarge where her speech was found to be slurred. The patient was also found to have worsening left lower extremity weakness. The patient denied any loss of consciousness, no episodes of seizures. Upon presentation to the ED, she was found to be hypertensive with systolic blood pressure in the 190s. The patient's symptoms resolved quickly and currently she is at her baseline. The ER is requesting admission because of recurrent symptoms.   PAST MEDICAL HISTORY:  1. Hypertension. 2. Diabetes. 3. Peripheral vascular disease. 4. Diabetic neuropathy.   PAST SURGICAL HISTORY:  1. Left carotid endarterectomy in [place] of this year.  2. Total abdominal hysterectomy. 3. Gallbladder surgery.  FAMILY HISTORY: Mother has hypertension and diabetes.   ALLERGIES: Penicillin and sulfa.   SOCIAL HISTORY: No smoking, no alcohol, and or drugs. Lives with her husband.   HOME MEDICATIONS:  1. Omeprazole 40 mg p.o. daily.  2. Lisinopril 40 mg p.o. twice a day. 3. Vitamin D3 1 tablet p.o. daily.  4. Levothyroxine 150 mcg p.o. daily.  5. Novolin 70/30 subcutaneous 50 units twice a day.  6. NovoLog mix 70/30 subcutaneous suspension.  7. Metoprolol 50 mg p.o. daily.  8. MiraLax one packet daily.  9. Neurontin 600 mg twice a day. 10. Zocor 40 mg p.o. daily.  11. Norvasc 10 mg p.o. daily.  12. Aspirin 325  mg p.o. daily.   REVIEW OF SYSTEMS: CONSTITUTIONAL: No fever, fatigue, weakness, pain, weight loss, or weight gain. EYES: No blurry vision, double vision, pain, redness, inflammation, or glaucoma. RESPIRATORY: No cough. No wheezing. No hemoptysis. No dyspnea. No asthma. CARDIOVASCULAR: No chest pain, orthopnea, edema, or arrhythmia. ENT: No tinnitus, ear pain, hearing loss, or seasonal allergies. GENITOURINARY: No dysuria, hematuria, renal calculi, or frequency. ENDOCRINE: No polyuria, nocturia, thyroid problems, or increased sweating. HEME: No anemia, easy bruising, bleeding, or swollen glands. NEURO: No numbness, weakness, dysarthria, epilepsy, tremor, or vertigo. PSYCHIATRIC: No anxiety, insomnia, bipolar disorder, or schizophrenia.   PHYSICAL EXAMINATION:   VITALS: Blood pressure 181/65, respirations 18, pulse 70, and saturation 98% on 2 liters.   GENERAL: Currently alert, oriented, and comfortable, in no acute cardiopulmonary distress.  HEENT: Pupils equal and reactive. Extraocular movements intact.   NECK: Supple. No JVD.   LUNGS: Clear to auscultation bilaterally. No wheezes, crackles, or rhonchi.   CARDIOVASCULAR: Regular rate and rhythm. No murmurs, rubs, or gallops.   ABDOMEN: Soft, nontender, and nondistended   EXTREMITIES: No cyanosis, clubbing, or edema.   NEUROLOGIC: Cranial nerves II through XII grossly intact. Deep tendon reflexes 2+ bilaterally. Sensation decreased in the left lower extremity. Motor strength somewhat decreased, 4/5 in the left lower extremity.   PSYCHIATRIC: Appropriate mood and affect.   LABORATORY, DIAGNOSTIC AND RADIOLOGIC DATA: Glucose 267, BUN 35, creatinine 0.97, sodium 134, potassium 4.8, and chloride 98. Anion gap 7. Triglycerides 319 and LDL 136.  CBC: WBC 10.4, hemoglobin 13.6, hematocrit 40.3, and platelet count 194.   INR 0.9.  Urinalysis negative.   MRI of the brain without contrast shows no objective evidence of any acute ischemic  infarction. The patient has white matter signal changes compatible with chronic small vessel disease, no demyelinating process. There is no evidence of any acute intracranial hemorrhage.   Repeat CT scan of the head today also showed no acute intracranial hemorrhage.   ASSESSMENT AND PLAN:  1. Recurrent transient ischemic attack. The patient was on aspirin. She needs to be switched to Plavix.  The patient's symptoms coincide with her hypertensive episodes and therefore she is being admitted for hypertensive emergency. The patient symptoms have resolved therefore she is outside the window of any TPA. The patient symptoms are at her baseline at which she was discharged yesterday. The patient's blood pressure is normalized into the 130s now with her home medications. The patient will receive p.r.n. labetalol/hydralazine as needed for better blood pressure control. We will discontinue aspirin and switch her to Plavix and then a neurology consultation will be obtained in the morning and speech therapy, occupational therapy, and physical therapy consultation will also be obtained, although she was evaluated by occupational and physical therapy and was deemed not to be a candidate for skilled nursing facility placement; however, this will be reconsidered.  2. Diabetes mellitus, type 2. The patient will be continued at home dose of insulin and sliding scale insulin.       3. Hypothyroidism. Continue home medications. 4. History of diabetic neuropathy. The patient was initiated on Gabapentin.   time spent 70 mins ____________________________ Reyne Dumas, MD na:slb D: 11/16/2011 01:26:36 ET T: 11/16/2011 06:36:43 ET JOB#: [id number]  cc: Reyne Dumas, MD, <Dictator> Eduard Clos. Gilford Rile, MD Reyne Dumas MD ELECTRONICALLY SIGNED 11/20/2011 22:43

## 2014-10-01 ENCOUNTER — Other Ambulatory Visit: Payer: Self-pay | Admitting: Internal Medicine

## 2015-05-20 DIAGNOSIS — E119 Type 2 diabetes mellitus without complications: Secondary | ICD-10-CM | POA: Diagnosis not present

## 2015-05-20 DIAGNOSIS — J449 Chronic obstructive pulmonary disease, unspecified: Secondary | ICD-10-CM | POA: Diagnosis not present

## 2015-05-20 DIAGNOSIS — I5031 Acute diastolic (congestive) heart failure: Secondary | ICD-10-CM | POA: Diagnosis not present

## 2015-05-27 DIAGNOSIS — E1165 Type 2 diabetes mellitus with hyperglycemia: Secondary | ICD-10-CM | POA: Diagnosis not present

## 2015-05-27 DIAGNOSIS — I6521 Occlusion and stenosis of right carotid artery: Secondary | ICD-10-CM | POA: Diagnosis not present

## 2015-05-27 DIAGNOSIS — D649 Anemia, unspecified: Secondary | ICD-10-CM | POA: Diagnosis not present

## 2015-05-27 DIAGNOSIS — R079 Chest pain, unspecified: Secondary | ICD-10-CM | POA: Diagnosis not present

## 2015-05-27 DIAGNOSIS — I110 Hypertensive heart disease with heart failure: Secondary | ICD-10-CM | POA: Diagnosis not present

## 2015-05-27 DIAGNOSIS — J449 Chronic obstructive pulmonary disease, unspecified: Secondary | ICD-10-CM | POA: Diagnosis not present

## 2015-05-27 DIAGNOSIS — I739 Peripheral vascular disease, unspecified: Secondary | ICD-10-CM | POA: Diagnosis not present

## 2015-05-27 DIAGNOSIS — I10 Essential (primary) hypertension: Secondary | ICD-10-CM | POA: Diagnosis not present

## 2015-05-27 DIAGNOSIS — I2510 Atherosclerotic heart disease of native coronary artery without angina pectoris: Secondary | ICD-10-CM | POA: Diagnosis not present

## 2015-05-27 DIAGNOSIS — I5032 Chronic diastolic (congestive) heart failure: Secondary | ICD-10-CM | POA: Diagnosis not present

## 2015-05-27 DIAGNOSIS — I701 Atherosclerosis of renal artery: Secondary | ICD-10-CM | POA: Diagnosis not present

## 2015-06-01 DIAGNOSIS — E113411 Type 2 diabetes mellitus with severe nonproliferative diabetic retinopathy with macular edema, right eye: Secondary | ICD-10-CM | POA: Diagnosis not present

## 2015-06-01 DIAGNOSIS — H43393 Other vitreous opacities, bilateral: Secondary | ICD-10-CM | POA: Diagnosis not present

## 2015-06-01 DIAGNOSIS — H2511 Age-related nuclear cataract, right eye: Secondary | ICD-10-CM | POA: Diagnosis not present

## 2015-06-01 DIAGNOSIS — Z794 Long term (current) use of insulin: Secondary | ICD-10-CM | POA: Diagnosis not present

## 2015-06-01 DIAGNOSIS — E113492 Type 2 diabetes mellitus with severe nonproliferative diabetic retinopathy without macular edema, left eye: Secondary | ICD-10-CM | POA: Diagnosis not present

## 2015-06-03 DIAGNOSIS — D649 Anemia, unspecified: Secondary | ICD-10-CM | POA: Diagnosis not present

## 2015-06-03 DIAGNOSIS — Z0000 Encounter for general adult medical examination without abnormal findings: Secondary | ICD-10-CM | POA: Diagnosis not present

## 2015-06-03 DIAGNOSIS — I110 Hypertensive heart disease with heart failure: Secondary | ICD-10-CM | POA: Diagnosis not present

## 2015-06-03 DIAGNOSIS — I2510 Atherosclerotic heart disease of native coronary artery without angina pectoris: Secondary | ICD-10-CM | POA: Diagnosis not present

## 2015-06-03 DIAGNOSIS — E039 Hypothyroidism, unspecified: Secondary | ICD-10-CM | POA: Diagnosis not present

## 2015-06-03 DIAGNOSIS — F09 Unspecified mental disorder due to known physiological condition: Secondary | ICD-10-CM | POA: Diagnosis not present

## 2015-06-03 DIAGNOSIS — E113413 Type 2 diabetes mellitus with severe nonproliferative diabetic retinopathy with macular edema, bilateral: Secondary | ICD-10-CM | POA: Diagnosis not present

## 2015-06-03 DIAGNOSIS — I509 Heart failure, unspecified: Secondary | ICD-10-CM | POA: Diagnosis not present

## 2015-06-03 DIAGNOSIS — J449 Chronic obstructive pulmonary disease, unspecified: Secondary | ICD-10-CM | POA: Diagnosis not present

## 2015-06-13 DIAGNOSIS — J449 Chronic obstructive pulmonary disease, unspecified: Secondary | ICD-10-CM | POA: Diagnosis not present

## 2015-06-13 DIAGNOSIS — R269 Unspecified abnormalities of gait and mobility: Secondary | ICD-10-CM | POA: Diagnosis not present

## 2015-06-13 DIAGNOSIS — N3941 Urge incontinence: Secondary | ICD-10-CM | POA: Diagnosis not present

## 2015-06-13 DIAGNOSIS — I5031 Acute diastolic (congestive) heart failure: Secondary | ICD-10-CM | POA: Diagnosis not present

## 2015-06-13 DIAGNOSIS — E119 Type 2 diabetes mellitus without complications: Secondary | ICD-10-CM | POA: Diagnosis not present

## 2015-06-15 DIAGNOSIS — G8929 Other chronic pain: Secondary | ICD-10-CM | POA: Diagnosis not present

## 2015-06-15 DIAGNOSIS — M545 Low back pain: Secondary | ICD-10-CM | POA: Diagnosis not present

## 2015-06-20 DIAGNOSIS — J449 Chronic obstructive pulmonary disease, unspecified: Secondary | ICD-10-CM | POA: Diagnosis not present

## 2015-06-20 DIAGNOSIS — I5031 Acute diastolic (congestive) heart failure: Secondary | ICD-10-CM | POA: Diagnosis not present

## 2015-06-20 DIAGNOSIS — E119 Type 2 diabetes mellitus without complications: Secondary | ICD-10-CM | POA: Diagnosis not present

## 2015-06-28 DIAGNOSIS — R296 Repeated falls: Secondary | ICD-10-CM | POA: Diagnosis not present

## 2015-06-28 DIAGNOSIS — H269 Unspecified cataract: Secondary | ICD-10-CM | POA: Diagnosis not present

## 2015-06-28 DIAGNOSIS — I10 Essential (primary) hypertension: Secondary | ICD-10-CM | POA: Diagnosis not present

## 2015-07-08 DIAGNOSIS — H2511 Age-related nuclear cataract, right eye: Secondary | ICD-10-CM | POA: Diagnosis not present

## 2015-07-08 DIAGNOSIS — H16143 Punctate keratitis, bilateral: Secondary | ICD-10-CM | POA: Diagnosis not present

## 2015-07-08 DIAGNOSIS — Z9842 Cataract extraction status, left eye: Secondary | ICD-10-CM | POA: Diagnosis not present

## 2015-07-08 DIAGNOSIS — Z961 Presence of intraocular lens: Secondary | ICD-10-CM | POA: Diagnosis not present

## 2015-07-08 DIAGNOSIS — Z794 Long term (current) use of insulin: Secondary | ICD-10-CM | POA: Diagnosis not present

## 2015-07-08 DIAGNOSIS — E113411 Type 2 diabetes mellitus with severe nonproliferative diabetic retinopathy with macular edema, right eye: Secondary | ICD-10-CM | POA: Diagnosis not present

## 2015-07-08 DIAGNOSIS — H40003 Preglaucoma, unspecified, bilateral: Secondary | ICD-10-CM | POA: Diagnosis not present

## 2015-07-08 DIAGNOSIS — E113492 Type 2 diabetes mellitus with severe nonproliferative diabetic retinopathy without macular edema, left eye: Secondary | ICD-10-CM | POA: Diagnosis not present

## 2015-07-12 DIAGNOSIS — J449 Chronic obstructive pulmonary disease, unspecified: Secondary | ICD-10-CM | POA: Diagnosis not present

## 2015-07-12 DIAGNOSIS — R269 Unspecified abnormalities of gait and mobility: Secondary | ICD-10-CM | POA: Diagnosis not present

## 2015-07-12 DIAGNOSIS — I5031 Acute diastolic (congestive) heart failure: Secondary | ICD-10-CM | POA: Diagnosis not present

## 2015-07-12 DIAGNOSIS — E119 Type 2 diabetes mellitus without complications: Secondary | ICD-10-CM | POA: Diagnosis not present

## 2015-07-12 DIAGNOSIS — N3941 Urge incontinence: Secondary | ICD-10-CM | POA: Diagnosis not present

## 2015-07-18 DIAGNOSIS — F419 Anxiety disorder, unspecified: Secondary | ICD-10-CM | POA: Diagnosis not present

## 2015-07-18 DIAGNOSIS — N189 Chronic kidney disease, unspecified: Secondary | ICD-10-CM | POA: Diagnosis not present

## 2015-07-18 DIAGNOSIS — M549 Dorsalgia, unspecified: Secondary | ICD-10-CM | POA: Diagnosis not present

## 2015-07-18 DIAGNOSIS — Z5309 Procedure and treatment not carried out because of other contraindication: Secondary | ICD-10-CM | POA: Diagnosis not present

## 2015-07-18 DIAGNOSIS — E1122 Type 2 diabetes mellitus with diabetic chronic kidney disease: Secondary | ICD-10-CM | POA: Diagnosis not present

## 2015-07-18 DIAGNOSIS — J449 Chronic obstructive pulmonary disease, unspecified: Secondary | ICD-10-CM | POA: Diagnosis not present

## 2015-07-18 DIAGNOSIS — I5031 Acute diastolic (congestive) heart failure: Secondary | ICD-10-CM | POA: Diagnosis not present

## 2015-07-18 DIAGNOSIS — K830 Cholangitis: Secondary | ICD-10-CM | POA: Diagnosis not present

## 2015-07-18 DIAGNOSIS — E119 Type 2 diabetes mellitus without complications: Secondary | ICD-10-CM | POA: Diagnosis not present

## 2015-07-18 DIAGNOSIS — I130 Hypertensive heart and chronic kidney disease with heart failure and stage 1 through stage 4 chronic kidney disease, or unspecified chronic kidney disease: Secondary | ICD-10-CM | POA: Diagnosis not present

## 2015-07-18 DIAGNOSIS — I509 Heart failure, unspecified: Secondary | ICD-10-CM | POA: Diagnosis not present

## 2015-07-18 DIAGNOSIS — E1140 Type 2 diabetes mellitus with diabetic neuropathy, unspecified: Secondary | ICD-10-CM | POA: Diagnosis not present

## 2015-07-27 DIAGNOSIS — E113311 Type 2 diabetes mellitus with moderate nonproliferative diabetic retinopathy with macular edema, right eye: Secondary | ICD-10-CM | POA: Diagnosis not present

## 2015-07-27 DIAGNOSIS — Z961 Presence of intraocular lens: Secondary | ICD-10-CM | POA: Diagnosis not present

## 2015-07-27 DIAGNOSIS — E113392 Type 2 diabetes mellitus with moderate nonproliferative diabetic retinopathy without macular edema, left eye: Secondary | ICD-10-CM | POA: Diagnosis not present

## 2015-07-27 DIAGNOSIS — H2511 Age-related nuclear cataract, right eye: Secondary | ICD-10-CM | POA: Diagnosis not present

## 2015-08-11 DIAGNOSIS — I5031 Acute diastolic (congestive) heart failure: Secondary | ICD-10-CM | POA: Diagnosis not present

## 2015-08-11 DIAGNOSIS — J449 Chronic obstructive pulmonary disease, unspecified: Secondary | ICD-10-CM | POA: Diagnosis not present

## 2015-08-11 DIAGNOSIS — R269 Unspecified abnormalities of gait and mobility: Secondary | ICD-10-CM | POA: Diagnosis not present

## 2015-08-11 DIAGNOSIS — E119 Type 2 diabetes mellitus without complications: Secondary | ICD-10-CM | POA: Diagnosis not present

## 2015-08-11 DIAGNOSIS — N3941 Urge incontinence: Secondary | ICD-10-CM | POA: Diagnosis not present

## 2015-08-15 DIAGNOSIS — M47816 Spondylosis without myelopathy or radiculopathy, lumbar region: Secondary | ICD-10-CM | POA: Diagnosis not present

## 2015-08-15 DIAGNOSIS — Z0000 Encounter for general adult medical examination without abnormal findings: Secondary | ICD-10-CM | POA: Diagnosis not present

## 2015-08-15 DIAGNOSIS — Z1231 Encounter for screening mammogram for malignant neoplasm of breast: Secondary | ICD-10-CM | POA: Diagnosis not present

## 2015-08-15 DIAGNOSIS — G4733 Obstructive sleep apnea (adult) (pediatric): Secondary | ICD-10-CM | POA: Diagnosis not present

## 2015-08-15 DIAGNOSIS — E039 Hypothyroidism, unspecified: Secondary | ICD-10-CM | POA: Diagnosis not present

## 2015-08-15 DIAGNOSIS — Z1211 Encounter for screening for malignant neoplasm of colon: Secondary | ICD-10-CM | POA: Diagnosis not present

## 2015-08-15 DIAGNOSIS — R1310 Dysphagia, unspecified: Secondary | ICD-10-CM | POA: Diagnosis not present

## 2015-08-15 DIAGNOSIS — Z794 Long term (current) use of insulin: Secondary | ICD-10-CM | POA: Diagnosis not present

## 2015-08-15 DIAGNOSIS — F329 Major depressive disorder, single episode, unspecified: Secondary | ICD-10-CM | POA: Diagnosis not present

## 2015-08-15 DIAGNOSIS — E113413 Type 2 diabetes mellitus with severe nonproliferative diabetic retinopathy with macular edema, bilateral: Secondary | ICD-10-CM | POA: Diagnosis not present

## 2015-08-18 DIAGNOSIS — I5031 Acute diastolic (congestive) heart failure: Secondary | ICD-10-CM | POA: Diagnosis not present

## 2015-08-18 DIAGNOSIS — E119 Type 2 diabetes mellitus without complications: Secondary | ICD-10-CM | POA: Diagnosis not present

## 2015-08-18 DIAGNOSIS — J449 Chronic obstructive pulmonary disease, unspecified: Secondary | ICD-10-CM | POA: Diagnosis not present

## 2015-09-11 DIAGNOSIS — J449 Chronic obstructive pulmonary disease, unspecified: Secondary | ICD-10-CM | POA: Diagnosis not present

## 2015-09-11 DIAGNOSIS — I5031 Acute diastolic (congestive) heart failure: Secondary | ICD-10-CM | POA: Diagnosis not present

## 2015-09-11 DIAGNOSIS — E119 Type 2 diabetes mellitus without complications: Secondary | ICD-10-CM | POA: Diagnosis not present

## 2015-09-11 DIAGNOSIS — N3941 Urge incontinence: Secondary | ICD-10-CM | POA: Diagnosis not present

## 2015-09-11 DIAGNOSIS — R269 Unspecified abnormalities of gait and mobility: Secondary | ICD-10-CM | POA: Diagnosis not present

## 2015-09-17 DIAGNOSIS — I5031 Acute diastolic (congestive) heart failure: Secondary | ICD-10-CM | POA: Diagnosis not present

## 2015-09-17 DIAGNOSIS — E119 Type 2 diabetes mellitus without complications: Secondary | ICD-10-CM | POA: Diagnosis not present

## 2015-09-17 DIAGNOSIS — J449 Chronic obstructive pulmonary disease, unspecified: Secondary | ICD-10-CM | POA: Diagnosis not present

## 2015-09-21 DIAGNOSIS — H2511 Age-related nuclear cataract, right eye: Secondary | ICD-10-CM | POA: Diagnosis not present

## 2015-09-21 DIAGNOSIS — Z794 Long term (current) use of insulin: Secondary | ICD-10-CM | POA: Diagnosis not present

## 2015-09-21 DIAGNOSIS — E113393 Type 2 diabetes mellitus with moderate nonproliferative diabetic retinopathy without macular edema, bilateral: Secondary | ICD-10-CM | POA: Diagnosis not present

## 2015-09-21 DIAGNOSIS — E113311 Type 2 diabetes mellitus with moderate nonproliferative diabetic retinopathy with macular edema, right eye: Secondary | ICD-10-CM | POA: Diagnosis not present

## 2015-09-26 DIAGNOSIS — Z1211 Encounter for screening for malignant neoplasm of colon: Secondary | ICD-10-CM | POA: Diagnosis not present

## 2015-09-26 DIAGNOSIS — F329 Major depressive disorder, single episode, unspecified: Secondary | ICD-10-CM | POA: Diagnosis not present

## 2015-09-26 DIAGNOSIS — E113413 Type 2 diabetes mellitus with severe nonproliferative diabetic retinopathy with macular edema, bilateral: Secondary | ICD-10-CM | POA: Diagnosis not present

## 2015-09-26 DIAGNOSIS — J449 Chronic obstructive pulmonary disease, unspecified: Secondary | ICD-10-CM | POA: Diagnosis not present

## 2015-09-26 DIAGNOSIS — Z794 Long term (current) use of insulin: Secondary | ICD-10-CM | POA: Diagnosis not present

## 2015-09-26 DIAGNOSIS — F09 Unspecified mental disorder due to known physiological condition: Secondary | ICD-10-CM | POA: Diagnosis not present

## 2015-10-03 DIAGNOSIS — F419 Anxiety disorder, unspecified: Secondary | ICD-10-CM | POA: Diagnosis not present

## 2015-10-03 DIAGNOSIS — I130 Hypertensive heart and chronic kidney disease with heart failure and stage 1 through stage 4 chronic kidney disease, or unspecified chronic kidney disease: Secondary | ICD-10-CM | POA: Diagnosis not present

## 2015-10-03 DIAGNOSIS — N189 Chronic kidney disease, unspecified: Secondary | ICD-10-CM | POA: Diagnosis not present

## 2015-10-03 DIAGNOSIS — E1122 Type 2 diabetes mellitus with diabetic chronic kidney disease: Secondary | ICD-10-CM | POA: Diagnosis not present

## 2015-10-03 DIAGNOSIS — R1310 Dysphagia, unspecified: Secondary | ICD-10-CM | POA: Diagnosis not present

## 2015-10-03 DIAGNOSIS — E1140 Type 2 diabetes mellitus with diabetic neuropathy, unspecified: Secondary | ICD-10-CM | POA: Diagnosis not present

## 2015-10-03 DIAGNOSIS — K449 Diaphragmatic hernia without obstruction or gangrene: Secondary | ICD-10-CM | POA: Diagnosis not present

## 2015-10-03 DIAGNOSIS — I509 Heart failure, unspecified: Secondary | ICD-10-CM | POA: Diagnosis not present

## 2015-10-03 DIAGNOSIS — E1136 Type 2 diabetes mellitus with diabetic cataract: Secondary | ICD-10-CM | POA: Diagnosis not present

## 2015-10-03 DIAGNOSIS — Z01818 Encounter for other preprocedural examination: Secondary | ICD-10-CM | POA: Diagnosis not present

## 2015-10-11 DIAGNOSIS — N3941 Urge incontinence: Secondary | ICD-10-CM | POA: Diagnosis not present

## 2015-10-11 DIAGNOSIS — E119 Type 2 diabetes mellitus without complications: Secondary | ICD-10-CM | POA: Diagnosis not present

## 2015-10-11 DIAGNOSIS — R269 Unspecified abnormalities of gait and mobility: Secondary | ICD-10-CM | POA: Diagnosis not present

## 2015-10-11 DIAGNOSIS — J449 Chronic obstructive pulmonary disease, unspecified: Secondary | ICD-10-CM | POA: Diagnosis not present

## 2015-10-11 DIAGNOSIS — I5031 Acute diastolic (congestive) heart failure: Secondary | ICD-10-CM | POA: Diagnosis not present

## 2015-10-18 DIAGNOSIS — E119 Type 2 diabetes mellitus without complications: Secondary | ICD-10-CM | POA: Diagnosis not present

## 2015-10-18 DIAGNOSIS — J449 Chronic obstructive pulmonary disease, unspecified: Secondary | ICD-10-CM | POA: Diagnosis not present

## 2015-10-18 DIAGNOSIS — I5031 Acute diastolic (congestive) heart failure: Secondary | ICD-10-CM | POA: Diagnosis not present

## 2015-11-04 DIAGNOSIS — I440 Atrioventricular block, first degree: Secondary | ICD-10-CM | POA: Diagnosis not present

## 2015-11-04 DIAGNOSIS — R9431 Abnormal electrocardiogram [ECG] [EKG]: Secondary | ICD-10-CM | POA: Diagnosis not present

## 2015-11-04 DIAGNOSIS — F329 Major depressive disorder, single episode, unspecified: Secondary | ICD-10-CM | POA: Diagnosis not present

## 2015-11-04 DIAGNOSIS — I129 Hypertensive chronic kidney disease with stage 1 through stage 4 chronic kidney disease, or unspecified chronic kidney disease: Secondary | ICD-10-CM | POA: Diagnosis not present

## 2015-11-04 DIAGNOSIS — R0602 Shortness of breath: Secondary | ICD-10-CM | POA: Diagnosis not present

## 2015-11-04 DIAGNOSIS — I509 Heart failure, unspecified: Secondary | ICD-10-CM | POA: Diagnosis not present

## 2015-11-04 DIAGNOSIS — G8929 Other chronic pain: Secondary | ICD-10-CM | POA: Diagnosis not present

## 2015-11-04 DIAGNOSIS — I130 Hypertensive heart and chronic kidney disease with heart failure and stage 1 through stage 4 chronic kidney disease, or unspecified chronic kidney disease: Secondary | ICD-10-CM | POA: Diagnosis not present

## 2015-11-04 DIAGNOSIS — I445 Left posterior fascicular block: Secondary | ICD-10-CM | POA: Diagnosis not present

## 2015-11-04 DIAGNOSIS — I2510 Atherosclerotic heart disease of native coronary artery without angina pectoris: Secondary | ICD-10-CM | POA: Diagnosis not present

## 2015-11-04 DIAGNOSIS — I5033 Acute on chronic diastolic (congestive) heart failure: Secondary | ICD-10-CM | POA: Diagnosis not present

## 2015-11-04 DIAGNOSIS — N189 Chronic kidney disease, unspecified: Secondary | ICD-10-CM | POA: Diagnosis not present

## 2015-11-04 DIAGNOSIS — I160 Hypertensive urgency: Secondary | ICD-10-CM | POA: Diagnosis not present

## 2015-11-04 DIAGNOSIS — J449 Chronic obstructive pulmonary disease, unspecified: Secondary | ICD-10-CM | POA: Diagnosis not present

## 2015-11-04 DIAGNOSIS — E039 Hypothyroidism, unspecified: Secondary | ICD-10-CM | POA: Diagnosis not present

## 2015-11-04 DIAGNOSIS — J811 Chronic pulmonary edema: Secondary | ICD-10-CM | POA: Diagnosis not present

## 2015-11-04 DIAGNOSIS — J90 Pleural effusion, not elsewhere classified: Secondary | ICD-10-CM | POA: Diagnosis not present

## 2015-11-04 DIAGNOSIS — E785 Hyperlipidemia, unspecified: Secondary | ICD-10-CM | POA: Diagnosis not present

## 2015-11-07 DIAGNOSIS — F329 Major depressive disorder, single episode, unspecified: Secondary | ICD-10-CM | POA: Diagnosis not present

## 2015-11-07 DIAGNOSIS — J449 Chronic obstructive pulmonary disease, unspecified: Secondary | ICD-10-CM | POA: Diagnosis not present

## 2015-11-07 DIAGNOSIS — E1140 Type 2 diabetes mellitus with diabetic neuropathy, unspecified: Secondary | ICD-10-CM | POA: Diagnosis not present

## 2015-11-07 DIAGNOSIS — I5032 Chronic diastolic (congestive) heart failure: Secondary | ICD-10-CM | POA: Diagnosis not present

## 2015-11-07 DIAGNOSIS — G8929 Other chronic pain: Secondary | ICD-10-CM | POA: Diagnosis not present

## 2015-11-07 DIAGNOSIS — N189 Chronic kidney disease, unspecified: Secondary | ICD-10-CM | POA: Diagnosis not present

## 2015-11-07 DIAGNOSIS — D649 Anemia, unspecified: Secondary | ICD-10-CM | POA: Diagnosis not present

## 2015-11-07 DIAGNOSIS — I130 Hypertensive heart and chronic kidney disease with heart failure and stage 1 through stage 4 chronic kidney disease, or unspecified chronic kidney disease: Secondary | ICD-10-CM | POA: Diagnosis not present

## 2015-11-07 DIAGNOSIS — E1122 Type 2 diabetes mellitus with diabetic chronic kidney disease: Secondary | ICD-10-CM | POA: Diagnosis not present

## 2015-11-09 DIAGNOSIS — I130 Hypertensive heart and chronic kidney disease with heart failure and stage 1 through stage 4 chronic kidney disease, or unspecified chronic kidney disease: Secondary | ICD-10-CM | POA: Diagnosis not present

## 2015-11-09 DIAGNOSIS — I5032 Chronic diastolic (congestive) heart failure: Secondary | ICD-10-CM | POA: Diagnosis not present

## 2015-11-09 DIAGNOSIS — F329 Major depressive disorder, single episode, unspecified: Secondary | ICD-10-CM | POA: Diagnosis not present

## 2015-11-09 DIAGNOSIS — E1140 Type 2 diabetes mellitus with diabetic neuropathy, unspecified: Secondary | ICD-10-CM | POA: Diagnosis not present

## 2015-11-09 DIAGNOSIS — E1122 Type 2 diabetes mellitus with diabetic chronic kidney disease: Secondary | ICD-10-CM | POA: Diagnosis not present

## 2015-11-09 DIAGNOSIS — J449 Chronic obstructive pulmonary disease, unspecified: Secondary | ICD-10-CM | POA: Diagnosis not present

## 2015-11-09 DIAGNOSIS — N189 Chronic kidney disease, unspecified: Secondary | ICD-10-CM | POA: Diagnosis not present

## 2015-11-09 DIAGNOSIS — D649 Anemia, unspecified: Secondary | ICD-10-CM | POA: Diagnosis not present

## 2015-11-09 DIAGNOSIS — G8929 Other chronic pain: Secondary | ICD-10-CM | POA: Diagnosis not present

## 2015-11-10 DIAGNOSIS — N189 Chronic kidney disease, unspecified: Secondary | ICD-10-CM | POA: Diagnosis not present

## 2015-11-10 DIAGNOSIS — G8929 Other chronic pain: Secondary | ICD-10-CM | POA: Diagnosis not present

## 2015-11-10 DIAGNOSIS — D649 Anemia, unspecified: Secondary | ICD-10-CM | POA: Diagnosis not present

## 2015-11-10 DIAGNOSIS — J449 Chronic obstructive pulmonary disease, unspecified: Secondary | ICD-10-CM | POA: Diagnosis not present

## 2015-11-10 DIAGNOSIS — E1140 Type 2 diabetes mellitus with diabetic neuropathy, unspecified: Secondary | ICD-10-CM | POA: Diagnosis not present

## 2015-11-10 DIAGNOSIS — E1122 Type 2 diabetes mellitus with diabetic chronic kidney disease: Secondary | ICD-10-CM | POA: Diagnosis not present

## 2015-11-10 DIAGNOSIS — I5032 Chronic diastolic (congestive) heart failure: Secondary | ICD-10-CM | POA: Diagnosis not present

## 2015-11-10 DIAGNOSIS — F329 Major depressive disorder, single episode, unspecified: Secondary | ICD-10-CM | POA: Diagnosis not present

## 2015-11-10 DIAGNOSIS — I130 Hypertensive heart and chronic kidney disease with heart failure and stage 1 through stage 4 chronic kidney disease, or unspecified chronic kidney disease: Secondary | ICD-10-CM | POA: Diagnosis not present

## 2015-11-11 DIAGNOSIS — N3941 Urge incontinence: Secondary | ICD-10-CM | POA: Diagnosis not present

## 2015-11-11 DIAGNOSIS — R269 Unspecified abnormalities of gait and mobility: Secondary | ICD-10-CM | POA: Diagnosis not present

## 2015-11-11 DIAGNOSIS — J449 Chronic obstructive pulmonary disease, unspecified: Secondary | ICD-10-CM | POA: Diagnosis not present

## 2015-11-11 DIAGNOSIS — E119 Type 2 diabetes mellitus without complications: Secondary | ICD-10-CM | POA: Diagnosis not present

## 2015-11-11 DIAGNOSIS — I5031 Acute diastolic (congestive) heart failure: Secondary | ICD-10-CM | POA: Diagnosis not present

## 2015-11-14 DIAGNOSIS — J449 Chronic obstructive pulmonary disease, unspecified: Secondary | ICD-10-CM | POA: Diagnosis not present

## 2015-11-14 DIAGNOSIS — G8929 Other chronic pain: Secondary | ICD-10-CM | POA: Diagnosis not present

## 2015-11-14 DIAGNOSIS — D649 Anemia, unspecified: Secondary | ICD-10-CM | POA: Diagnosis not present

## 2015-11-14 DIAGNOSIS — E1140 Type 2 diabetes mellitus with diabetic neuropathy, unspecified: Secondary | ICD-10-CM | POA: Diagnosis not present

## 2015-11-14 DIAGNOSIS — I5032 Chronic diastolic (congestive) heart failure: Secondary | ICD-10-CM | POA: Diagnosis not present

## 2015-11-14 DIAGNOSIS — I130 Hypertensive heart and chronic kidney disease with heart failure and stage 1 through stage 4 chronic kidney disease, or unspecified chronic kidney disease: Secondary | ICD-10-CM | POA: Diagnosis not present

## 2015-11-14 DIAGNOSIS — F329 Major depressive disorder, single episode, unspecified: Secondary | ICD-10-CM | POA: Diagnosis not present

## 2015-11-14 DIAGNOSIS — N189 Chronic kidney disease, unspecified: Secondary | ICD-10-CM | POA: Diagnosis not present

## 2015-11-14 DIAGNOSIS — E1122 Type 2 diabetes mellitus with diabetic chronic kidney disease: Secondary | ICD-10-CM | POA: Diagnosis not present

## 2015-11-16 DIAGNOSIS — I130 Hypertensive heart and chronic kidney disease with heart failure and stage 1 through stage 4 chronic kidney disease, or unspecified chronic kidney disease: Secondary | ICD-10-CM | POA: Diagnosis not present

## 2015-11-16 DIAGNOSIS — E1122 Type 2 diabetes mellitus with diabetic chronic kidney disease: Secondary | ICD-10-CM | POA: Diagnosis not present

## 2015-11-16 DIAGNOSIS — F329 Major depressive disorder, single episode, unspecified: Secondary | ICD-10-CM | POA: Diagnosis not present

## 2015-11-16 DIAGNOSIS — G8929 Other chronic pain: Secondary | ICD-10-CM | POA: Diagnosis not present

## 2015-11-16 DIAGNOSIS — D649 Anemia, unspecified: Secondary | ICD-10-CM | POA: Diagnosis not present

## 2015-11-16 DIAGNOSIS — J449 Chronic obstructive pulmonary disease, unspecified: Secondary | ICD-10-CM | POA: Diagnosis not present

## 2015-11-16 DIAGNOSIS — I5032 Chronic diastolic (congestive) heart failure: Secondary | ICD-10-CM | POA: Diagnosis not present

## 2015-11-16 DIAGNOSIS — E1140 Type 2 diabetes mellitus with diabetic neuropathy, unspecified: Secondary | ICD-10-CM | POA: Diagnosis not present

## 2015-11-16 DIAGNOSIS — N189 Chronic kidney disease, unspecified: Secondary | ICD-10-CM | POA: Diagnosis not present

## 2015-11-17 DIAGNOSIS — E119 Type 2 diabetes mellitus without complications: Secondary | ICD-10-CM | POA: Diagnosis not present

## 2015-11-17 DIAGNOSIS — E1122 Type 2 diabetes mellitus with diabetic chronic kidney disease: Secondary | ICD-10-CM | POA: Diagnosis not present

## 2015-11-17 DIAGNOSIS — N189 Chronic kidney disease, unspecified: Secondary | ICD-10-CM | POA: Diagnosis not present

## 2015-11-17 DIAGNOSIS — Z09 Encounter for follow-up examination after completed treatment for conditions other than malignant neoplasm: Secondary | ICD-10-CM | POA: Diagnosis not present

## 2015-11-17 DIAGNOSIS — I5032 Chronic diastolic (congestive) heart failure: Secondary | ICD-10-CM | POA: Diagnosis not present

## 2015-11-17 DIAGNOSIS — I509 Heart failure, unspecified: Secondary | ICD-10-CM | POA: Diagnosis not present

## 2015-11-17 DIAGNOSIS — G8929 Other chronic pain: Secondary | ICD-10-CM | POA: Diagnosis not present

## 2015-11-17 DIAGNOSIS — F329 Major depressive disorder, single episode, unspecified: Secondary | ICD-10-CM | POA: Diagnosis not present

## 2015-11-17 DIAGNOSIS — D649 Anemia, unspecified: Secondary | ICD-10-CM | POA: Diagnosis not present

## 2015-11-17 DIAGNOSIS — E1140 Type 2 diabetes mellitus with diabetic neuropathy, unspecified: Secondary | ICD-10-CM | POA: Diagnosis not present

## 2015-11-17 DIAGNOSIS — J449 Chronic obstructive pulmonary disease, unspecified: Secondary | ICD-10-CM | POA: Diagnosis not present

## 2015-11-17 DIAGNOSIS — I5031 Acute diastolic (congestive) heart failure: Secondary | ICD-10-CM | POA: Diagnosis not present

## 2015-11-17 DIAGNOSIS — I130 Hypertensive heart and chronic kidney disease with heart failure and stage 1 through stage 4 chronic kidney disease, or unspecified chronic kidney disease: Secondary | ICD-10-CM | POA: Diagnosis not present

## 2015-11-17 DIAGNOSIS — F09 Unspecified mental disorder due to known physiological condition: Secondary | ICD-10-CM | POA: Diagnosis not present

## 2015-11-21 DIAGNOSIS — I5032 Chronic diastolic (congestive) heart failure: Secondary | ICD-10-CM | POA: Diagnosis not present

## 2015-11-21 DIAGNOSIS — E1122 Type 2 diabetes mellitus with diabetic chronic kidney disease: Secondary | ICD-10-CM | POA: Diagnosis not present

## 2015-11-21 DIAGNOSIS — G8929 Other chronic pain: Secondary | ICD-10-CM | POA: Diagnosis not present

## 2015-11-21 DIAGNOSIS — I130 Hypertensive heart and chronic kidney disease with heart failure and stage 1 through stage 4 chronic kidney disease, or unspecified chronic kidney disease: Secondary | ICD-10-CM | POA: Diagnosis not present

## 2015-11-21 DIAGNOSIS — F329 Major depressive disorder, single episode, unspecified: Secondary | ICD-10-CM | POA: Diagnosis not present

## 2015-11-21 DIAGNOSIS — J449 Chronic obstructive pulmonary disease, unspecified: Secondary | ICD-10-CM | POA: Diagnosis not present

## 2015-11-21 DIAGNOSIS — D649 Anemia, unspecified: Secondary | ICD-10-CM | POA: Diagnosis not present

## 2015-11-21 DIAGNOSIS — N189 Chronic kidney disease, unspecified: Secondary | ICD-10-CM | POA: Diagnosis not present

## 2015-11-21 DIAGNOSIS — E1140 Type 2 diabetes mellitus with diabetic neuropathy, unspecified: Secondary | ICD-10-CM | POA: Diagnosis not present

## 2015-11-22 DIAGNOSIS — E1140 Type 2 diabetes mellitus with diabetic neuropathy, unspecified: Secondary | ICD-10-CM | POA: Diagnosis not present

## 2015-11-22 DIAGNOSIS — G8929 Other chronic pain: Secondary | ICD-10-CM | POA: Diagnosis not present

## 2015-11-22 DIAGNOSIS — E1122 Type 2 diabetes mellitus with diabetic chronic kidney disease: Secondary | ICD-10-CM | POA: Diagnosis not present

## 2015-11-22 DIAGNOSIS — I5032 Chronic diastolic (congestive) heart failure: Secondary | ICD-10-CM | POA: Diagnosis not present

## 2015-11-22 DIAGNOSIS — D649 Anemia, unspecified: Secondary | ICD-10-CM | POA: Diagnosis not present

## 2015-11-22 DIAGNOSIS — J449 Chronic obstructive pulmonary disease, unspecified: Secondary | ICD-10-CM | POA: Diagnosis not present

## 2015-11-22 DIAGNOSIS — N189 Chronic kidney disease, unspecified: Secondary | ICD-10-CM | POA: Diagnosis not present

## 2015-11-22 DIAGNOSIS — F329 Major depressive disorder, single episode, unspecified: Secondary | ICD-10-CM | POA: Diagnosis not present

## 2015-11-22 DIAGNOSIS — I130 Hypertensive heart and chronic kidney disease with heart failure and stage 1 through stage 4 chronic kidney disease, or unspecified chronic kidney disease: Secondary | ICD-10-CM | POA: Diagnosis not present

## 2015-11-23 DIAGNOSIS — G8929 Other chronic pain: Secondary | ICD-10-CM | POA: Diagnosis not present

## 2015-11-23 DIAGNOSIS — I130 Hypertensive heart and chronic kidney disease with heart failure and stage 1 through stage 4 chronic kidney disease, or unspecified chronic kidney disease: Secondary | ICD-10-CM | POA: Diagnosis not present

## 2015-11-23 DIAGNOSIS — J449 Chronic obstructive pulmonary disease, unspecified: Secondary | ICD-10-CM | POA: Diagnosis not present

## 2015-11-23 DIAGNOSIS — D649 Anemia, unspecified: Secondary | ICD-10-CM | POA: Diagnosis not present

## 2015-11-23 DIAGNOSIS — E1140 Type 2 diabetes mellitus with diabetic neuropathy, unspecified: Secondary | ICD-10-CM | POA: Diagnosis not present

## 2015-11-23 DIAGNOSIS — F329 Major depressive disorder, single episode, unspecified: Secondary | ICD-10-CM | POA: Diagnosis not present

## 2015-11-23 DIAGNOSIS — I5032 Chronic diastolic (congestive) heart failure: Secondary | ICD-10-CM | POA: Diagnosis not present

## 2015-11-23 DIAGNOSIS — E1122 Type 2 diabetes mellitus with diabetic chronic kidney disease: Secondary | ICD-10-CM | POA: Diagnosis not present

## 2015-11-23 DIAGNOSIS — N189 Chronic kidney disease, unspecified: Secondary | ICD-10-CM | POA: Diagnosis not present

## 2015-11-24 DIAGNOSIS — E1122 Type 2 diabetes mellitus with diabetic chronic kidney disease: Secondary | ICD-10-CM | POA: Diagnosis not present

## 2015-11-24 DIAGNOSIS — J449 Chronic obstructive pulmonary disease, unspecified: Secondary | ICD-10-CM | POA: Diagnosis not present

## 2015-11-24 DIAGNOSIS — F329 Major depressive disorder, single episode, unspecified: Secondary | ICD-10-CM | POA: Diagnosis not present

## 2015-11-24 DIAGNOSIS — G8929 Other chronic pain: Secondary | ICD-10-CM | POA: Diagnosis not present

## 2015-11-24 DIAGNOSIS — D649 Anemia, unspecified: Secondary | ICD-10-CM | POA: Diagnosis not present

## 2015-11-24 DIAGNOSIS — N189 Chronic kidney disease, unspecified: Secondary | ICD-10-CM | POA: Diagnosis not present

## 2015-11-24 DIAGNOSIS — I130 Hypertensive heart and chronic kidney disease with heart failure and stage 1 through stage 4 chronic kidney disease, or unspecified chronic kidney disease: Secondary | ICD-10-CM | POA: Diagnosis not present

## 2015-11-24 DIAGNOSIS — I5032 Chronic diastolic (congestive) heart failure: Secondary | ICD-10-CM | POA: Diagnosis not present

## 2015-11-24 DIAGNOSIS — E1140 Type 2 diabetes mellitus with diabetic neuropathy, unspecified: Secondary | ICD-10-CM | POA: Diagnosis not present

## 2015-11-25 DIAGNOSIS — I5032 Chronic diastolic (congestive) heart failure: Secondary | ICD-10-CM | POA: Diagnosis not present

## 2015-11-25 DIAGNOSIS — G8929 Other chronic pain: Secondary | ICD-10-CM | POA: Diagnosis not present

## 2015-11-25 DIAGNOSIS — F329 Major depressive disorder, single episode, unspecified: Secondary | ICD-10-CM | POA: Diagnosis not present

## 2015-11-25 DIAGNOSIS — D649 Anemia, unspecified: Secondary | ICD-10-CM | POA: Diagnosis not present

## 2015-11-25 DIAGNOSIS — J449 Chronic obstructive pulmonary disease, unspecified: Secondary | ICD-10-CM | POA: Diagnosis not present

## 2015-11-25 DIAGNOSIS — E1122 Type 2 diabetes mellitus with diabetic chronic kidney disease: Secondary | ICD-10-CM | POA: Diagnosis not present

## 2015-11-25 DIAGNOSIS — N189 Chronic kidney disease, unspecified: Secondary | ICD-10-CM | POA: Diagnosis not present

## 2015-11-25 DIAGNOSIS — E1140 Type 2 diabetes mellitus with diabetic neuropathy, unspecified: Secondary | ICD-10-CM | POA: Diagnosis not present

## 2015-11-25 DIAGNOSIS — I130 Hypertensive heart and chronic kidney disease with heart failure and stage 1 through stage 4 chronic kidney disease, or unspecified chronic kidney disease: Secondary | ICD-10-CM | POA: Diagnosis not present

## 2015-11-29 DIAGNOSIS — G8929 Other chronic pain: Secondary | ICD-10-CM | POA: Diagnosis not present

## 2015-11-29 DIAGNOSIS — J449 Chronic obstructive pulmonary disease, unspecified: Secondary | ICD-10-CM | POA: Diagnosis not present

## 2015-11-29 DIAGNOSIS — I5032 Chronic diastolic (congestive) heart failure: Secondary | ICD-10-CM | POA: Diagnosis not present

## 2015-11-29 DIAGNOSIS — N189 Chronic kidney disease, unspecified: Secondary | ICD-10-CM | POA: Diagnosis not present

## 2015-11-29 DIAGNOSIS — E1140 Type 2 diabetes mellitus with diabetic neuropathy, unspecified: Secondary | ICD-10-CM | POA: Diagnosis not present

## 2015-11-29 DIAGNOSIS — F329 Major depressive disorder, single episode, unspecified: Secondary | ICD-10-CM | POA: Diagnosis not present

## 2015-11-29 DIAGNOSIS — E1122 Type 2 diabetes mellitus with diabetic chronic kidney disease: Secondary | ICD-10-CM | POA: Diagnosis not present

## 2015-11-29 DIAGNOSIS — I130 Hypertensive heart and chronic kidney disease with heart failure and stage 1 through stage 4 chronic kidney disease, or unspecified chronic kidney disease: Secondary | ICD-10-CM | POA: Diagnosis not present

## 2015-11-29 DIAGNOSIS — D649 Anemia, unspecified: Secondary | ICD-10-CM | POA: Diagnosis not present

## 2015-12-01 DIAGNOSIS — G8929 Other chronic pain: Secondary | ICD-10-CM | POA: Diagnosis not present

## 2015-12-01 DIAGNOSIS — I130 Hypertensive heart and chronic kidney disease with heart failure and stage 1 through stage 4 chronic kidney disease, or unspecified chronic kidney disease: Secondary | ICD-10-CM | POA: Diagnosis not present

## 2015-12-01 DIAGNOSIS — F329 Major depressive disorder, single episode, unspecified: Secondary | ICD-10-CM | POA: Diagnosis not present

## 2015-12-01 DIAGNOSIS — E1140 Type 2 diabetes mellitus with diabetic neuropathy, unspecified: Secondary | ICD-10-CM | POA: Diagnosis not present

## 2015-12-01 DIAGNOSIS — D649 Anemia, unspecified: Secondary | ICD-10-CM | POA: Diagnosis not present

## 2015-12-01 DIAGNOSIS — I5032 Chronic diastolic (congestive) heart failure: Secondary | ICD-10-CM | POA: Diagnosis not present

## 2015-12-01 DIAGNOSIS — E1122 Type 2 diabetes mellitus with diabetic chronic kidney disease: Secondary | ICD-10-CM | POA: Diagnosis not present

## 2015-12-01 DIAGNOSIS — J449 Chronic obstructive pulmonary disease, unspecified: Secondary | ICD-10-CM | POA: Diagnosis not present

## 2015-12-01 DIAGNOSIS — N189 Chronic kidney disease, unspecified: Secondary | ICD-10-CM | POA: Diagnosis not present

## 2015-12-06 DIAGNOSIS — I5032 Chronic diastolic (congestive) heart failure: Secondary | ICD-10-CM | POA: Diagnosis not present

## 2015-12-06 DIAGNOSIS — N189 Chronic kidney disease, unspecified: Secondary | ICD-10-CM | POA: Diagnosis not present

## 2015-12-06 DIAGNOSIS — G8929 Other chronic pain: Secondary | ICD-10-CM | POA: Diagnosis not present

## 2015-12-06 DIAGNOSIS — D649 Anemia, unspecified: Secondary | ICD-10-CM | POA: Diagnosis not present

## 2015-12-06 DIAGNOSIS — I130 Hypertensive heart and chronic kidney disease with heart failure and stage 1 through stage 4 chronic kidney disease, or unspecified chronic kidney disease: Secondary | ICD-10-CM | POA: Diagnosis not present

## 2015-12-06 DIAGNOSIS — E1140 Type 2 diabetes mellitus with diabetic neuropathy, unspecified: Secondary | ICD-10-CM | POA: Diagnosis not present

## 2015-12-06 DIAGNOSIS — E1122 Type 2 diabetes mellitus with diabetic chronic kidney disease: Secondary | ICD-10-CM | POA: Diagnosis not present

## 2015-12-06 DIAGNOSIS — F329 Major depressive disorder, single episode, unspecified: Secondary | ICD-10-CM | POA: Diagnosis not present

## 2015-12-06 DIAGNOSIS — J449 Chronic obstructive pulmonary disease, unspecified: Secondary | ICD-10-CM | POA: Diagnosis not present

## 2015-12-07 DIAGNOSIS — D649 Anemia, unspecified: Secondary | ICD-10-CM | POA: Diagnosis not present

## 2015-12-07 DIAGNOSIS — I130 Hypertensive heart and chronic kidney disease with heart failure and stage 1 through stage 4 chronic kidney disease, or unspecified chronic kidney disease: Secondary | ICD-10-CM | POA: Diagnosis not present

## 2015-12-07 DIAGNOSIS — E113311 Type 2 diabetes mellitus with moderate nonproliferative diabetic retinopathy with macular edema, right eye: Secondary | ICD-10-CM | POA: Diagnosis not present

## 2015-12-07 DIAGNOSIS — E1140 Type 2 diabetes mellitus with diabetic neuropathy, unspecified: Secondary | ICD-10-CM | POA: Diagnosis not present

## 2015-12-07 DIAGNOSIS — I5032 Chronic diastolic (congestive) heart failure: Secondary | ICD-10-CM | POA: Diagnosis not present

## 2015-12-07 DIAGNOSIS — J449 Chronic obstructive pulmonary disease, unspecified: Secondary | ICD-10-CM | POA: Diagnosis not present

## 2015-12-07 DIAGNOSIS — E133392 Other specified diabetes mellitus with moderate nonproliferative diabetic retinopathy without macular edema, left eye: Secondary | ICD-10-CM | POA: Diagnosis not present

## 2015-12-07 DIAGNOSIS — E1122 Type 2 diabetes mellitus with diabetic chronic kidney disease: Secondary | ICD-10-CM | POA: Diagnosis not present

## 2015-12-07 DIAGNOSIS — G8929 Other chronic pain: Secondary | ICD-10-CM | POA: Diagnosis not present

## 2015-12-07 DIAGNOSIS — N189 Chronic kidney disease, unspecified: Secondary | ICD-10-CM | POA: Diagnosis not present

## 2015-12-07 DIAGNOSIS — H2513 Age-related nuclear cataract, bilateral: Secondary | ICD-10-CM | POA: Diagnosis not present

## 2015-12-07 DIAGNOSIS — H43393 Other vitreous opacities, bilateral: Secondary | ICD-10-CM | POA: Diagnosis not present

## 2015-12-07 DIAGNOSIS — F329 Major depressive disorder, single episode, unspecified: Secondary | ICD-10-CM | POA: Diagnosis not present

## 2015-12-08 DIAGNOSIS — F329 Major depressive disorder, single episode, unspecified: Secondary | ICD-10-CM | POA: Diagnosis not present

## 2015-12-08 DIAGNOSIS — E1140 Type 2 diabetes mellitus with diabetic neuropathy, unspecified: Secondary | ICD-10-CM | POA: Diagnosis not present

## 2015-12-08 DIAGNOSIS — G8929 Other chronic pain: Secondary | ICD-10-CM | POA: Diagnosis not present

## 2015-12-08 DIAGNOSIS — N189 Chronic kidney disease, unspecified: Secondary | ICD-10-CM | POA: Diagnosis not present

## 2015-12-08 DIAGNOSIS — I5032 Chronic diastolic (congestive) heart failure: Secondary | ICD-10-CM | POA: Diagnosis not present

## 2015-12-08 DIAGNOSIS — D649 Anemia, unspecified: Secondary | ICD-10-CM | POA: Diagnosis not present

## 2015-12-08 DIAGNOSIS — E1122 Type 2 diabetes mellitus with diabetic chronic kidney disease: Secondary | ICD-10-CM | POA: Diagnosis not present

## 2015-12-08 DIAGNOSIS — J449 Chronic obstructive pulmonary disease, unspecified: Secondary | ICD-10-CM | POA: Diagnosis not present

## 2015-12-08 DIAGNOSIS — I130 Hypertensive heart and chronic kidney disease with heart failure and stage 1 through stage 4 chronic kidney disease, or unspecified chronic kidney disease: Secondary | ICD-10-CM | POA: Diagnosis not present

## 2015-12-11 DIAGNOSIS — N3941 Urge incontinence: Secondary | ICD-10-CM | POA: Diagnosis not present

## 2015-12-11 DIAGNOSIS — I5031 Acute diastolic (congestive) heart failure: Secondary | ICD-10-CM | POA: Diagnosis not present

## 2015-12-11 DIAGNOSIS — E119 Type 2 diabetes mellitus without complications: Secondary | ICD-10-CM | POA: Diagnosis not present

## 2015-12-11 DIAGNOSIS — J449 Chronic obstructive pulmonary disease, unspecified: Secondary | ICD-10-CM | POA: Diagnosis not present

## 2015-12-11 DIAGNOSIS — R269 Unspecified abnormalities of gait and mobility: Secondary | ICD-10-CM | POA: Diagnosis not present

## 2015-12-18 DIAGNOSIS — I5031 Acute diastolic (congestive) heart failure: Secondary | ICD-10-CM | POA: Diagnosis not present

## 2015-12-18 DIAGNOSIS — E119 Type 2 diabetes mellitus without complications: Secondary | ICD-10-CM | POA: Diagnosis not present

## 2015-12-18 DIAGNOSIS — J449 Chronic obstructive pulmonary disease, unspecified: Secondary | ICD-10-CM | POA: Diagnosis not present

## 2016-01-11 DIAGNOSIS — J449 Chronic obstructive pulmonary disease, unspecified: Secondary | ICD-10-CM | POA: Diagnosis not present

## 2016-01-11 DIAGNOSIS — R269 Unspecified abnormalities of gait and mobility: Secondary | ICD-10-CM | POA: Diagnosis not present

## 2016-01-11 DIAGNOSIS — I5031 Acute diastolic (congestive) heart failure: Secondary | ICD-10-CM | POA: Diagnosis not present

## 2016-01-11 DIAGNOSIS — E119 Type 2 diabetes mellitus without complications: Secondary | ICD-10-CM | POA: Diagnosis not present

## 2016-01-11 DIAGNOSIS — N3941 Urge incontinence: Secondary | ICD-10-CM | POA: Diagnosis not present

## 2016-01-18 DIAGNOSIS — E113392 Type 2 diabetes mellitus with moderate nonproliferative diabetic retinopathy without macular edema, left eye: Secondary | ICD-10-CM | POA: Diagnosis not present

## 2016-01-18 DIAGNOSIS — H2511 Age-related nuclear cataract, right eye: Secondary | ICD-10-CM | POA: Diagnosis not present

## 2016-01-18 DIAGNOSIS — I5031 Acute diastolic (congestive) heart failure: Secondary | ICD-10-CM | POA: Diagnosis not present

## 2016-01-18 DIAGNOSIS — E119 Type 2 diabetes mellitus without complications: Secondary | ICD-10-CM | POA: Diagnosis not present

## 2016-01-18 DIAGNOSIS — Z961 Presence of intraocular lens: Secondary | ICD-10-CM | POA: Diagnosis not present

## 2016-01-18 DIAGNOSIS — H01003 Unspecified blepharitis right eye, unspecified eyelid: Secondary | ICD-10-CM | POA: Diagnosis not present

## 2016-01-18 DIAGNOSIS — E113311 Type 2 diabetes mellitus with moderate nonproliferative diabetic retinopathy with macular edema, right eye: Secondary | ICD-10-CM | POA: Diagnosis not present

## 2016-01-18 DIAGNOSIS — J449 Chronic obstructive pulmonary disease, unspecified: Secondary | ICD-10-CM | POA: Diagnosis not present

## 2016-01-18 DIAGNOSIS — H01006 Unspecified blepharitis left eye, unspecified eyelid: Secondary | ICD-10-CM | POA: Diagnosis not present

## 2016-02-02 DIAGNOSIS — I5032 Chronic diastolic (congestive) heart failure: Secondary | ICD-10-CM | POA: Diagnosis not present

## 2016-02-02 DIAGNOSIS — J439 Emphysema, unspecified: Secondary | ICD-10-CM | POA: Diagnosis not present

## 2016-02-02 DIAGNOSIS — E1140 Type 2 diabetes mellitus with diabetic neuropathy, unspecified: Secondary | ICD-10-CM | POA: Diagnosis not present

## 2016-02-02 DIAGNOSIS — Z794 Long term (current) use of insulin: Secondary | ICD-10-CM | POA: Diagnosis not present

## 2016-02-02 DIAGNOSIS — F329 Major depressive disorder, single episode, unspecified: Secondary | ICD-10-CM | POA: Diagnosis not present

## 2016-02-11 DIAGNOSIS — N3941 Urge incontinence: Secondary | ICD-10-CM | POA: Diagnosis not present

## 2016-02-11 DIAGNOSIS — R269 Unspecified abnormalities of gait and mobility: Secondary | ICD-10-CM | POA: Diagnosis not present

## 2016-02-11 DIAGNOSIS — E119 Type 2 diabetes mellitus without complications: Secondary | ICD-10-CM | POA: Diagnosis not present

## 2016-02-11 DIAGNOSIS — I5031 Acute diastolic (congestive) heart failure: Secondary | ICD-10-CM | POA: Diagnosis not present

## 2016-02-11 DIAGNOSIS — J449 Chronic obstructive pulmonary disease, unspecified: Secondary | ICD-10-CM | POA: Diagnosis not present

## 2016-02-14 DIAGNOSIS — N189 Chronic kidney disease, unspecified: Secondary | ICD-10-CM | POA: Diagnosis not present

## 2016-02-14 DIAGNOSIS — E1140 Type 2 diabetes mellitus with diabetic neuropathy, unspecified: Secondary | ICD-10-CM | POA: Diagnosis not present

## 2016-02-14 DIAGNOSIS — R6883 Chills (without fever): Secondary | ICD-10-CM | POA: Diagnosis not present

## 2016-02-14 DIAGNOSIS — R944 Abnormal results of kidney function studies: Secondary | ICD-10-CM | POA: Diagnosis not present

## 2016-02-14 DIAGNOSIS — E1122 Type 2 diabetes mellitus with diabetic chronic kidney disease: Secondary | ICD-10-CM | POA: Diagnosis not present

## 2016-02-14 DIAGNOSIS — R1030 Lower abdominal pain, unspecified: Secondary | ICD-10-CM | POA: Diagnosis not present

## 2016-02-14 DIAGNOSIS — I509 Heart failure, unspecified: Secondary | ICD-10-CM | POA: Diagnosis not present

## 2016-02-14 DIAGNOSIS — R001 Bradycardia, unspecified: Secondary | ICD-10-CM | POA: Diagnosis not present

## 2016-02-14 DIAGNOSIS — E871 Hypo-osmolality and hyponatremia: Secondary | ICD-10-CM | POA: Diagnosis not present

## 2016-02-14 DIAGNOSIS — I130 Hypertensive heart and chronic kidney disease with heart failure and stage 1 through stage 4 chronic kidney disease, or unspecified chronic kidney disease: Secondary | ICD-10-CM | POA: Diagnosis not present

## 2016-02-14 DIAGNOSIS — R109 Unspecified abdominal pain: Secondary | ICD-10-CM | POA: Diagnosis not present

## 2016-02-17 DIAGNOSIS — J449 Chronic obstructive pulmonary disease, unspecified: Secondary | ICD-10-CM | POA: Diagnosis not present

## 2016-02-17 DIAGNOSIS — I5031 Acute diastolic (congestive) heart failure: Secondary | ICD-10-CM | POA: Diagnosis not present

## 2016-02-17 DIAGNOSIS — E119 Type 2 diabetes mellitus without complications: Secondary | ICD-10-CM | POA: Diagnosis not present

## 2016-03-12 DIAGNOSIS — N3941 Urge incontinence: Secondary | ICD-10-CM | POA: Diagnosis not present

## 2016-03-12 DIAGNOSIS — R269 Unspecified abnormalities of gait and mobility: Secondary | ICD-10-CM | POA: Diagnosis not present

## 2016-03-12 DIAGNOSIS — E119 Type 2 diabetes mellitus without complications: Secondary | ICD-10-CM | POA: Diagnosis not present

## 2016-03-12 DIAGNOSIS — I5031 Acute diastolic (congestive) heart failure: Secondary | ICD-10-CM | POA: Diagnosis not present

## 2016-03-12 DIAGNOSIS — J449 Chronic obstructive pulmonary disease, unspecified: Secondary | ICD-10-CM | POA: Diagnosis not present

## 2016-03-19 DIAGNOSIS — E119 Type 2 diabetes mellitus without complications: Secondary | ICD-10-CM | POA: Diagnosis not present

## 2016-03-19 DIAGNOSIS — J449 Chronic obstructive pulmonary disease, unspecified: Secondary | ICD-10-CM | POA: Diagnosis not present

## 2016-03-19 DIAGNOSIS — I5031 Acute diastolic (congestive) heart failure: Secondary | ICD-10-CM | POA: Diagnosis not present

## 2016-04-18 DIAGNOSIS — I5031 Acute diastolic (congestive) heart failure: Secondary | ICD-10-CM | POA: Diagnosis not present

## 2016-04-18 DIAGNOSIS — E119 Type 2 diabetes mellitus without complications: Secondary | ICD-10-CM | POA: Diagnosis not present

## 2016-04-18 DIAGNOSIS — J449 Chronic obstructive pulmonary disease, unspecified: Secondary | ICD-10-CM | POA: Diagnosis not present

## 2016-05-19 DIAGNOSIS — I5031 Acute diastolic (congestive) heart failure: Secondary | ICD-10-CM | POA: Diagnosis not present

## 2016-05-19 DIAGNOSIS — J449 Chronic obstructive pulmonary disease, unspecified: Secondary | ICD-10-CM | POA: Diagnosis not present

## 2016-05-19 DIAGNOSIS — E119 Type 2 diabetes mellitus without complications: Secondary | ICD-10-CM | POA: Diagnosis not present

## 2016-06-06 DIAGNOSIS — I5032 Chronic diastolic (congestive) heart failure: Secondary | ICD-10-CM | POA: Diagnosis not present

## 2016-06-06 DIAGNOSIS — F4320 Adjustment disorder, unspecified: Secondary | ICD-10-CM | POA: Diagnosis not present

## 2016-06-17 ENCOUNTER — Encounter: Payer: Self-pay | Admitting: Emergency Medicine

## 2016-06-17 ENCOUNTER — Inpatient Hospital Stay
Admission: EM | Admit: 2016-06-17 | Discharge: 2016-06-19 | DRG: 292 | Disposition: A | Discharge status: Home Health | Payer: Medicare HMO | Attending: Internal Medicine | Admitting: Internal Medicine

## 2016-06-17 DIAGNOSIS — E1165 Type 2 diabetes mellitus with hyperglycemia: Secondary | ICD-10-CM | POA: Diagnosis not present

## 2016-06-17 DIAGNOSIS — R0602 Shortness of breath: Secondary | ICD-10-CM | POA: Diagnosis not present

## 2016-06-17 DIAGNOSIS — R079 Chest pain, unspecified: Secondary | ICD-10-CM

## 2016-06-17 DIAGNOSIS — E785 Hyperlipidemia, unspecified: Secondary | ICD-10-CM | POA: Diagnosis present

## 2016-06-17 DIAGNOSIS — I4891 Unspecified atrial fibrillation: Secondary | ICD-10-CM | POA: Diagnosis not present

## 2016-06-17 DIAGNOSIS — Z7902 Long term (current) use of antithrombotics/antiplatelets: Secondary | ICD-10-CM | POA: Diagnosis not present

## 2016-06-17 DIAGNOSIS — N289 Disorder of kidney and ureter, unspecified: Secondary | ICD-10-CM | POA: Diagnosis present

## 2016-06-17 DIAGNOSIS — R262 Difficulty in walking, not elsewhere classified: Secondary | ICD-10-CM

## 2016-06-17 DIAGNOSIS — Z8711 Personal history of peptic ulcer disease: Secondary | ICD-10-CM | POA: Diagnosis not present

## 2016-06-17 DIAGNOSIS — Z7982 Long term (current) use of aspirin: Secondary | ICD-10-CM

## 2016-06-17 DIAGNOSIS — R0609 Other forms of dyspnea: Secondary | ICD-10-CM | POA: Diagnosis not present

## 2016-06-17 DIAGNOSIS — Z8249 Family history of ischemic heart disease and other diseases of the circulatory system: Secondary | ICD-10-CM

## 2016-06-17 DIAGNOSIS — R2681 Unsteadiness on feet: Secondary | ICD-10-CM

## 2016-06-17 DIAGNOSIS — Z90710 Acquired absence of both cervix and uterus: Secondary | ICD-10-CM

## 2016-06-17 DIAGNOSIS — Z9981 Dependence on supplemental oxygen: Secondary | ICD-10-CM | POA: Diagnosis not present

## 2016-06-17 DIAGNOSIS — Z794 Long term (current) use of insulin: Secondary | ICD-10-CM | POA: Diagnosis not present

## 2016-06-17 DIAGNOSIS — Z825 Family history of asthma and other chronic lower respiratory diseases: Secondary | ICD-10-CM

## 2016-06-17 DIAGNOSIS — Z79899 Other long term (current) drug therapy: Secondary | ICD-10-CM | POA: Diagnosis not present

## 2016-06-17 DIAGNOSIS — M6281 Muscle weakness (generalized): Secondary | ICD-10-CM

## 2016-06-17 DIAGNOSIS — K7581 Nonalcoholic steatohepatitis (NASH): Secondary | ICD-10-CM | POA: Diagnosis present

## 2016-06-17 DIAGNOSIS — G8929 Other chronic pain: Secondary | ICD-10-CM | POA: Diagnosis present

## 2016-06-17 DIAGNOSIS — I110 Hypertensive heart disease with heart failure: Secondary | ICD-10-CM | POA: Diagnosis not present

## 2016-06-17 DIAGNOSIS — I5031 Acute diastolic (congestive) heart failure: Secondary | ICD-10-CM | POA: Diagnosis not present

## 2016-06-17 DIAGNOSIS — I10 Essential (primary) hypertension: Secondary | ICD-10-CM | POA: Diagnosis not present

## 2016-06-17 DIAGNOSIS — Z87891 Personal history of nicotine dependence: Secondary | ICD-10-CM | POA: Diagnosis not present

## 2016-06-17 DIAGNOSIS — J9610 Chronic respiratory failure, unspecified whether with hypoxia or hypercapnia: Secondary | ICD-10-CM | POA: Diagnosis not present

## 2016-06-17 DIAGNOSIS — E119 Type 2 diabetes mellitus without complications: Secondary | ICD-10-CM | POA: Diagnosis not present

## 2016-06-17 DIAGNOSIS — I5033 Acute on chronic diastolic (congestive) heart failure: Secondary | ICD-10-CM | POA: Diagnosis not present

## 2016-06-17 DIAGNOSIS — E039 Hypothyroidism, unspecified: Secondary | ICD-10-CM | POA: Diagnosis present

## 2016-06-17 DIAGNOSIS — N179 Acute kidney failure, unspecified: Secondary | ICD-10-CM | POA: Diagnosis not present

## 2016-06-17 DIAGNOSIS — J449 Chronic obstructive pulmonary disease, unspecified: Secondary | ICD-10-CM | POA: Diagnosis not present

## 2016-06-17 DIAGNOSIS — I509 Heart failure, unspecified: Secondary | ICD-10-CM | POA: Diagnosis not present

## 2016-06-17 HISTORY — DX: Heart failure, unspecified: I50.9

## 2016-06-17 LAB — BASIC METABOLIC PANEL
ANION GAP: 9 (ref 5–15)
BUN: 46 mg/dL — ABNORMAL HIGH (ref 6–20)
CALCIUM: 8.2 mg/dL — AB (ref 8.9–10.3)
CO2: 27 mmol/L (ref 22–32)
Chloride: 101 mmol/L (ref 101–111)
Creatinine, Ser: 1.95 mg/dL — ABNORMAL HIGH (ref 0.44–1.00)
GFR calc Af Amer: 29 mL/min — ABNORMAL LOW (ref >60)
GFR calc non Af Amer: 25 mL/min — ABNORMAL LOW (ref >60)
GLUCOSE: 338 mg/dL — AB (ref 65–99)
Potassium: 4 mmol/L (ref 3.5–5.1)
SODIUM: 137 mmol/L (ref 135–145)

## 2016-06-17 LAB — CBC
HCT: 31.7 % — ABNORMAL LOW (ref 35.0–47.0)
HEMOGLOBIN: 10.7 g/dL — AB (ref 12.0–16.0)
MCH: 27.9 pg (ref 26.0–34.0)
MCHC: 33.6 g/dL (ref 32.0–36.0)
MCV: 83.1 fL (ref 80.0–100.0)
Platelets: 198 10*3/uL (ref 150–440)
RBC: 3.82 MIL/uL (ref 3.80–5.20)
RDW: 15.5 % — AB (ref 11.5–14.5)
WBC: 8.2 10*3/uL (ref 3.6–11.0)

## 2016-06-17 NOTE — ED Triage Notes (Signed)
Shortness of breath for approximately 4 weeks.  Patient is on home O2 at 3l per nasal cannula.  Reports that her son recently died around the time shortness of breath started.

## 2016-06-17 NOTE — ED Notes (Signed)
Pt reports shortness of breath for 4 weeks; c/o back pain between her shoulder blades; c/o arm soreness; intermittent chest tightness; denies cough; pt wears oxygen 3L via [place]; was only using it intermittently until she started feeling bad a few weeks ago; pt's son passed away May 27, 2016 and is tearful at times during assessment; pt with increased shortness of breath with exertion; after resting pt's breathing improves and she is able to talk in full sentences;

## 2016-06-18 ENCOUNTER — Encounter: Payer: Self-pay | Admitting: Internal Medicine

## 2016-06-18 ENCOUNTER — Emergency Department: Payer: Medicare HMO

## 2016-06-18 DIAGNOSIS — I5033 Acute on chronic diastolic (congestive) heart failure: Secondary | ICD-10-CM | POA: Diagnosis present

## 2016-06-18 DIAGNOSIS — E785 Hyperlipidemia, unspecified: Secondary | ICD-10-CM | POA: Diagnosis present

## 2016-06-18 DIAGNOSIS — I110 Hypertensive heart disease with heart failure: Secondary | ICD-10-CM | POA: Diagnosis present

## 2016-06-18 DIAGNOSIS — Z8711 Personal history of peptic ulcer disease: Secondary | ICD-10-CM | POA: Diagnosis not present

## 2016-06-18 DIAGNOSIS — J9610 Chronic respiratory failure, unspecified whether with hypoxia or hypercapnia: Secondary | ICD-10-CM | POA: Diagnosis present

## 2016-06-18 DIAGNOSIS — N289 Disorder of kidney and ureter, unspecified: Secondary | ICD-10-CM | POA: Diagnosis present

## 2016-06-18 DIAGNOSIS — Z8249 Family history of ischemic heart disease and other diseases of the circulatory system: Secondary | ICD-10-CM | POA: Diagnosis not present

## 2016-06-18 DIAGNOSIS — Z9981 Dependence on supplemental oxygen: Secondary | ICD-10-CM | POA: Diagnosis not present

## 2016-06-18 DIAGNOSIS — G8929 Other chronic pain: Secondary | ICD-10-CM | POA: Diagnosis present

## 2016-06-18 DIAGNOSIS — Z87891 Personal history of nicotine dependence: Secondary | ICD-10-CM | POA: Diagnosis not present

## 2016-06-18 DIAGNOSIS — E1165 Type 2 diabetes mellitus with hyperglycemia: Secondary | ICD-10-CM | POA: Diagnosis present

## 2016-06-18 DIAGNOSIS — R0609 Other forms of dyspnea: Secondary | ICD-10-CM | POA: Diagnosis present

## 2016-06-18 DIAGNOSIS — Z79899 Other long term (current) drug therapy: Secondary | ICD-10-CM | POA: Diagnosis not present

## 2016-06-18 DIAGNOSIS — Z794 Long term (current) use of insulin: Secondary | ICD-10-CM | POA: Diagnosis not present

## 2016-06-18 DIAGNOSIS — Z90710 Acquired absence of both cervix and uterus: Secondary | ICD-10-CM | POA: Diagnosis not present

## 2016-06-18 DIAGNOSIS — Z825 Family history of asthma and other chronic lower respiratory diseases: Secondary | ICD-10-CM | POA: Diagnosis not present

## 2016-06-18 DIAGNOSIS — I509 Heart failure, unspecified: Secondary | ICD-10-CM

## 2016-06-18 DIAGNOSIS — I4891 Unspecified atrial fibrillation: Secondary | ICD-10-CM | POA: Diagnosis present

## 2016-06-18 DIAGNOSIS — K7581 Nonalcoholic steatohepatitis (NASH): Secondary | ICD-10-CM | POA: Diagnosis present

## 2016-06-18 DIAGNOSIS — Z7902 Long term (current) use of antithrombotics/antiplatelets: Secondary | ICD-10-CM | POA: Diagnosis not present

## 2016-06-18 DIAGNOSIS — Z7982 Long term (current) use of aspirin: Secondary | ICD-10-CM | POA: Diagnosis not present

## 2016-06-18 DIAGNOSIS — E039 Hypothyroidism, unspecified: Secondary | ICD-10-CM | POA: Diagnosis present

## 2016-06-18 LAB — APTT: aPTT: 32 seconds (ref 24–36)

## 2016-06-18 LAB — GLUCOSE, CAPILLARY
GLUCOSE-CAPILLARY: 176 mg/dL — AB (ref 65–99)
GLUCOSE-CAPILLARY: 154 mg/dL — AB (ref 65–99)
Glucose-Capillary: 265 mg/dL — ABNORMAL HIGH (ref 65–99)
Glucose-Capillary: 159 mg/dL — ABNORMAL HIGH (ref 65–99)

## 2016-06-18 LAB — HEPATIC FUNCTION PANEL
ALK PHOS: 109 U/L (ref 38–126)
ALT: 10 U/L — AB (ref 14–54)
AST: 20 U/L (ref 15–41)
Albumin: 3.2 g/dL — ABNORMAL LOW (ref 3.5–5.0)
TOTAL PROTEIN: 6.4 g/dL — AB (ref 6.5–8.1)
Total Bilirubin: 0.7 mg/dL (ref 0.3–1.2)

## 2016-06-18 LAB — PROTIME-INR
INR: 0.99
Prothrombin Time: 13.1 seconds (ref 11.4–15.2)

## 2016-06-18 LAB — BRAIN NATRIURETIC PEPTIDE: B Natriuretic Peptide: 388 pg/mL — ABNORMAL HIGH (ref 0.0–100.0)

## 2016-06-18 LAB — TROPONIN I
Troponin I: <0.03 ng/mL (ref <0.03)
Troponin I: <0.03 ng/mL (ref <0.03)
Troponin I: <0.03 ng/mL (ref <0.03)
Troponin I: <0.03 ng/mL (ref <0.03)

## 2016-06-18 LAB — HEPARIN LEVEL (UNFRACTIONATED): Heparin Unfractionated: <0.1 IU/mL — ABNORMAL LOW (ref 0.30–0.70)

## 2016-06-18 MED ORDER — LEVOTHYROXINE SODIUM 150 MCG PO TABS
175.0000 ug | ORAL_TABLET | Freq: Every day | ORAL | Status: DC
  Administered 2016-06-18 – 2016-06-19 (×2): 175 ug via ORAL
  Filled 2016-06-18 (×2): qty 1

## 2016-06-18 MED ORDER — VITAMIN B-12 1000 MCG PO TABS
1000.0000 ug | ORAL_TABLET | Freq: Every day | ORAL | Status: DC
  Administered 2016-06-18 – 2016-06-19 (×2): 1000 ug via ORAL
  Filled 2016-06-18 (×2): qty 1

## 2016-06-18 MED ORDER — SODIUM CHLORIDE 0.9 % IV SOLN: 250.0000 mL | INTRAVENOUS | Status: DC | PRN

## 2016-06-18 MED ORDER — CLOPIDOGREL BISULFATE 75 MG PO TABS
75.0000 mg | ORAL_TABLET | Freq: Every day | ORAL | Status: DC
  Administered 2016-06-18 – 2016-06-19 (×2): 75 mg via ORAL
  Filled 2016-06-18 (×2): qty 1

## 2016-06-18 MED ORDER — ASPIRIN EC 81 MG PO TBEC
81.0000 mg | DELAYED_RELEASE_TABLET | Freq: Every day | ORAL | Status: DC
  Administered 2016-06-18 – 2016-06-19 (×2): 81 mg via ORAL
  Filled 2016-06-18 (×2): qty 1

## 2016-06-18 MED ORDER — HEPARIN (PORCINE) IN NACL 2-0.9 UNIT/ML-% IJ SOLN
INTRAMUSCULAR | Status: DC
  Filled 2016-06-18: qty 1000

## 2016-06-18 MED ORDER — ALUM & MAG HYDROXIDE-SIMETH 200-200-20 MG/5ML PO SUSP
15.0000 mL | ORAL | Status: DC | PRN
  Administered 2016-06-18: 15 mL via ORAL
  Filled 2016-06-18: qty 30

## 2016-06-18 MED ORDER — ADULT MULTIVITAMIN W/MINERALS CH
1.0000 | ORAL_TABLET | Freq: Every day | ORAL | Status: DC
Start: 2016-06-18 — End: 2016-06-19
  Administered 2016-06-18 – 2016-06-19 (×2): 1 via ORAL
  Filled 2016-06-18 (×2): qty 1

## 2016-06-18 MED ORDER — SODIUM CHLORIDE 0.9% FLUSH: 3.0000 mL | INTRAVENOUS | Status: DC | PRN

## 2016-06-18 MED ORDER — HEPARIN BOLUS VIA INFUSION
4000.0000 [IU] | Freq: Once | INTRAVENOUS | Status: AC
  Administered 2016-06-18: 4000 [IU] via INTRAVENOUS
  Filled 2016-06-18: qty 4000

## 2016-06-18 MED ORDER — ATORVASTATIN CALCIUM 20 MG PO TABS
20.0000 mg | ORAL_TABLET | Freq: Every day | ORAL | Status: DC
  Administered 2016-06-18 – 2016-06-19 (×2): 20 mg via ORAL
  Filled 2016-06-18 (×2): qty 1

## 2016-06-18 MED ORDER — DOCUSATE SODIUM 100 MG PO CAPS
100.0000 mg | ORAL_CAPSULE | Freq: Two times a day (BID) | ORAL | Status: DC
  Administered 2016-06-18 – 2016-06-19 (×4): 100 mg via ORAL
  Filled 2016-06-18 (×4): qty 1

## 2016-06-18 MED ORDER — VITAMIN D 1000 UNITS PO TABS
1000.0000 [IU] | ORAL_TABLET | Freq: Every day | ORAL | Status: DC
  Administered 2016-06-18 – 2016-06-19 (×2): 1000 [IU] via ORAL
  Filled 2016-06-18 (×2): qty 1

## 2016-06-18 MED ORDER — AMLODIPINE BESYLATE 10 MG PO TABS
10.0000 mg | ORAL_TABLET | Freq: Every day | ORAL | Status: DC
  Administered 2016-06-18 – 2016-06-19 (×2): 10 mg via ORAL
  Filled 2016-06-18 (×2): qty 1

## 2016-06-18 MED ORDER — RIVAROXABAN 15 MG PO TABS: 15.0000 mg | ORAL_TABLET | Freq: Every day | ORAL | Status: DC

## 2016-06-18 MED ORDER — SODIUM CHLORIDE 0.9% FLUSH
3.0000 mL | Freq: Two times a day (BID) | INTRAVENOUS | Status: DC
  Administered 2016-06-18 – 2016-06-19 (×2): 3 mL via INTRAVENOUS

## 2016-06-18 MED ORDER — PAROXETINE HCL 20 MG PO TABS
20.0000 mg | ORAL_TABLET | Freq: Every day | ORAL | Status: DC
  Administered 2016-06-18 – 2016-06-19 (×2): 20 mg via ORAL
  Filled 2016-06-18 (×2): qty 1

## 2016-06-18 MED ORDER — FUROSEMIDE 10 MG/ML IJ SOLN
60.0000 mg | Freq: Two times a day (BID) | INTRAMUSCULAR | Status: DC
  Administered 2016-06-18 – 2016-06-19 (×3): 60 mg via INTRAVENOUS
  Filled 2016-06-18 (×3): qty 6

## 2016-06-18 MED ORDER — HEPARIN BOLUS VIA INFUSION
2100.0000 [IU] | Freq: Once | INTRAVENOUS | Status: AC
  Administered 2016-06-18: 2100 [IU] via INTRAVENOUS
  Filled 2016-06-18: qty 2100

## 2016-06-18 MED ORDER — ASPIRIN EC 81 MG PO TBEC: 81.0000 mg | DELAYED_RELEASE_TABLET | Freq: Every day | ORAL | Status: DC

## 2016-06-18 MED ORDER — HEPARIN (PORCINE) IN NACL 100-0.45 UNIT/ML-% IJ SOLN
1250.0000 [IU]/h | INTRAMUSCULAR | Status: DC
  Administered 2016-06-18: 1000 [IU]/h via INTRAVENOUS
  Administered 2016-06-19: 1250 [IU]/h via INTRAVENOUS
  Filled 2016-06-18 (×2): qty 250

## 2016-06-18 MED ORDER — ALBUTEROL SULFATE (2.5 MG/3ML) 0.083% IN NEBU
2.5000 mg | INHALATION_SOLUTION | RESPIRATORY_TRACT | Status: DC | PRN
Start: 2016-06-18 — End: 2016-06-19
  Administered 2016-06-18: 2.5 mg via RESPIRATORY_TRACT
  Filled 2016-06-18: qty 3

## 2016-06-18 MED ORDER — ONDANSETRON HCL 4 MG/2ML IJ SOLN
4.0000 mg | Freq: Four times a day (QID) | INTRAMUSCULAR | Status: DC | PRN
  Administered 2016-06-18 – 2016-06-19 (×2): 4 mg via INTRAVENOUS
  Filled 2016-06-18 (×2): qty 2

## 2016-06-18 MED ORDER — CARVEDILOL 6.25 MG PO TABS
6.2500 mg | ORAL_TABLET | Freq: Two times a day (BID) | ORAL | Status: DC
  Administered 2016-06-18 – 2016-06-19 (×3): 6.25 mg via ORAL
  Filled 2016-06-18 (×3): qty 1

## 2016-06-18 MED ORDER — INSULIN ASPART 100 UNIT/ML ~~LOC~~ SOLN
0.0000 [IU] | Freq: Three times a day (TID) | SUBCUTANEOUS | Status: DC
  Administered 2016-06-18: 8 [IU] via SUBCUTANEOUS
  Administered 2016-06-18 – 2016-06-19 (×3): 3 [IU] via SUBCUTANEOUS
  Filled 2016-06-18: qty 8
  Filled 2016-06-18 (×3): qty 3

## 2016-06-18 MED ORDER — SODIUM CHLORIDE 0.9 % IV BOLUS (SEPSIS)
500.0000 mL | INTRAVENOUS | Status: AC
  Administered 2016-06-18: 500 mL via INTRAVENOUS

## 2016-06-18 MED ORDER — INSULIN ASPART 100 UNIT/ML ~~LOC~~ SOLN: 0.0000 [IU] | Freq: Three times a day (TID) | SUBCUTANEOUS | Status: DC

## 2016-06-18 MED ORDER — HEPARIN (PORCINE) IN NACL 100-0.45 UNIT/ML-% IJ SOLN
800.0000 [IU]/h | INTRAMUSCULAR | Status: DC
  Administered 2016-06-18: 800 [IU]/h via INTRAVENOUS
  Filled 2016-06-18: qty 250

## 2016-06-18 MED ORDER — HEPARIN BOLUS VIA INFUSION
2500.0000 [IU] | Freq: Once | INTRAVENOUS | Status: AC
  Administered 2016-06-18: 2500 [IU] via INTRAVENOUS
  Filled 2016-06-18: qty 2500

## 2016-06-18 MED ORDER — ACETAMINOPHEN 325 MG PO TABS: 650.0000 mg | ORAL_TABLET | ORAL | Status: DC | PRN

## 2016-06-18 MED ORDER — INSULIN ASPART 100 UNIT/ML ~~LOC~~ SOLN: 0.0000 [IU] | Freq: Every day | SUBCUTANEOUS | Status: DC

## 2016-06-18 NOTE — Progress Notes (Signed)
ANTICOAGULATION CONSULT NOTE - Initial Consult  Pharmacy Consult for heparin Indication: atrial fibrillation  Allergies  Allergen Reactions  . Watermelon Concentrate Anaphylaxis  . Ace Inhibitors   . Celebrex [Celecoxib] Rash  . Penicillins Rash  . Sulfa Antibiotics Rash    Patient Measurements: Height: 5\' 1"  (154.9 cm) Weight: 196 lb (88.9 kg) IBW/kg (Calculated) : 47.8 Heparin Dosing Weight: 68.5 kg  Vital Signs: Temp: 98.2 F (36.8 C) (02/04 2300) Temp Source: Oral (02/04 2300) BP: 172/68 (02/05 0300) Pulse Rate: 64 (02/05 0300)  Labs:  Recent Labs  06/17/16 2258 06/18/16 0129  HGB 10.7*  --   HCT 31.7*  --   PLT 198  --   APTT  --  32  LABPROT  --  13.1  INR  --  0.99  CREATININE 1.95*  --   TROPONINI <0.03  --     Estimated Creatinine Clearance: 27.2 mL/min (by C-G formula based on SCr of 1.95 mg/dL (H)).   Medical History: Past Medical History:  Diagnosis Date  . CHF (congestive heart failure) ([place])   . Chronic back pain    Followed by pain clinic in [place]  . Endometriosis   . HTN (hypertension)   . Hypothyroidism   . IDDM (insulin dependent diabetes mellitus) ([place])   . NASH (nonalcoholic steatohepatitis)    has had liver biopsy in past  . Obesity   . PUD (peptic ulcer disease) 1990's    Medications:  Infusions:  . heparin      Assessment: 19 yof cc SOB with PMH to include IDDM, CHF, O2 requirements at home. New onset AF in ED. Pharmacy consulted to dose UFH for AF. No PTA OAC listed.  Goal of Therapy:  Heparin level 0.3-0.7 units/ml Monitor platelets by anticoagulation protocol: Yes   Plan:  Give 4000 units bolus x 1 Start heparin infusion at 800 units/hr Check anti-Xa level in 8 hours and daily while on heparin Continue to monitor H&H and platelets  Laural Benes, Pharm.D., BCPS Clinical Pharmacist 06/18/2016,4:03 AM

## 2016-06-18 NOTE — ED Notes (Signed)
Pt's monitor alarming; pt had gotten up to toilet in room with assist by staff and back to bed assist by husband; monitor not attached to pt when alarming; pt awake and alert; says she was SOB moving to and from the toilet, but feeling better now that she's back to resting in bed; talking in complete coherent sentences

## 2016-06-18 NOTE — ED Notes (Signed)
Patient transported to X-ray via Biomedical scientist by [employer]

## 2016-06-18 NOTE — ED Notes (Signed)
Admitting MD at bedside.

## 2016-06-18 NOTE — Progress Notes (Signed)
[facility] NAME: Andrea [name]    MR#:  [id number]  DATE OF BIRTH:  05/01/1946  SUBJECTIVE:   Patient came in with increasing shortness of breath and was found to be in congestive heart failure. She is a poor historian. No family in the room. REVIEW OF SYSTEMS:   Review of Systems  Constitutional: Negative for chills, fever and weight loss.  HENT: Negative for ear discharge, ear pain and nosebleeds.   Eyes: Negative for blurred vision, pain and discharge.  Respiratory: Positive for shortness of breath. Negative for sputum production, wheezing and stridor.   Cardiovascular: Negative for chest pain, palpitations, orthopnea and PND.  Gastrointestinal: Negative for abdominal pain, diarrhea, nausea and vomiting.  Genitourinary: Negative for frequency and urgency.  Musculoskeletal: Negative for back pain and joint pain.  Neurological: Positive for weakness. Negative for sensory change, speech change and focal weakness.  Psychiatric/Behavioral: Negative for depression and hallucinations. The patient is not nervous/anxious.    Tolerating Diet: Yes Tolerating PT: Pending  DRUG ALLERGIES:   Allergies  Allergen Reactions  . Watermelon Concentrate Anaphylaxis  . Ace Inhibitors   . Celebrex [Celecoxib] Rash  . Penicillins Rash  . Sulfa Antibiotics Rash    VITALS:  Blood pressure 121/88, pulse 68, temperature 97.5 F (36.4 C), temperature source Oral, resp. rate 18, height 5\' 1"  (1.549 m), weight 90.7 kg (199 lb 14.4 oz), SpO2 96 %.  PHYSICAL EXAMINATION:   Physical Exam  GENERAL:  71 y.o.-year-old patient lying in the bed with no acute distress. Obese EYES: Pupils equal, round, reactive to light and accommodation. No scleral icterus. Extraocular muscles intact.  HEENT: Head atraumatic, normocephalic. Oropharynx and nasopharynx clear.  NECK:  Supple, no jugular venous distention. No thyroid enlargement, no tenderness.  LUNGS:  Distant breath sounds bilaterally, no wheezing, rales, rhonchi. No use of accessory muscles of respiration.  CARDIOVASCULAR: S1, S2 normal. No murmurs, rubs, or gallops.  ABDOMEN: Soft, nontender, nondistended. Bowel sounds present. No organomegaly or mass.  EXTREMITIES: No cyanosis, clubbing or edema b/l.    NEUROLOGIC: Cranial nerves II through XII are intact. No focal Motor or sensory deficits b/l.   PSYCHIATRIC:  patient is alert and oriented x 3.  SKIN: No obvious rash, lesion, or ulcer.   LABORATORY PANEL:  CBC  Recent Labs Lab 06/17/16 2258  WBC 8.2  HGB 10.7*  HCT 31.7*  PLT 198    Chemistries   Recent Labs Lab 06/17/16 2258  NA 137  K 4.0  CL 101  CO2 27  GLUCOSE 338*  BUN 46*  CREATININE 1.95*  CALCIUM 8.2*  AST 20  ALT 10*  ALKPHOS 109  BILITOT 0.7   Cardiac Enzymes  Recent Labs Lab 06/18/16 0414  TROPONINI <0.03   RADIOLOGY:  Dg Chest 2 View  Result Date: 06/18/2016 CLINICAL DATA:  Shortness of breath EXAM: CHEST  2 VIEW COMPARISON:  03/08/2014 FINDINGS: Tiny pleural effusions on the lateral view. No acute consolidation. There is cardiomegaly and mild central congestion. Atherosclerosis. No pneumothorax. IMPRESSION: 1. Tiny pleural effusions 2. Cardiomegaly with mild central vascular congestion Electronically Signed   By: Donavan Foil M.D.   On: 06/18/2016 00:33   ASSESSMENT AND PLAN:  71yo female with a history of congestive heart failure, hypertension, hypothyroidism, diabetes, and hyperlipidemia who was admitted with progressive shortness of breath. She is on 3L O2 by nasal canula as needed at home. Was noted to have new onset atrial fibrillation in  the ED.   1) Acute diastolic congestive heart failure (from echo done at [facility] about 1.5 years ago showing preserved LV function) - Continue gentle diuresis with Lasix 60mg  every 12 hours  - Echo pending - Appreciate input from cardiologyDr. F ATH  2) New onset atrial fibrillation  - Rate well  controlled. Continue Carvedilol - Continue Heparin  - GFR currently 25. Conversion to a normal oral agent will depend on renal function moving forward. Repeat metabolic panel in the AM  3) Uncontrolled diabetes - Continue Novolog sliding scale coverage    4) Indigestion  - Started on Maalox 30mL every 4 hours as needed   Case discussed with Care Management/Social Worker. Management plans discussed with the patient, family and they are in agreement.  CODE STATUS: Full  DVT Prophylaxis: *Heparin drip  TOTAL TIME TAKING CARE OF THIS PATIENT: 30 minutes.  >50% time spent on counselling and coordination of care  POSSIBLE D/C IN one DAYS, DEPENDING ON CLINICAL CONDITION.  Note: This dictation was prepared with Dragon dictation along with smaller phrase technology. Any transcriptional errors that result from this process are unintentional.  Andrea [name] M.D on 06/18/2016 at 3:59 PM  Between 7am to 6pm - Pager - [phone]  After 6pm go to [email or web address] - password [email or web address]  Office  [phone]  CC: Primary care physician; No PCP Per Patient

## 2016-06-18 NOTE — Progress Notes (Signed)
Maalox 59ml given per pt request for indigestion, will monitor.

## 2016-06-18 NOTE — ED Notes (Signed)
Dr Karma Greaser in to see pt

## 2016-06-18 NOTE — Discharge Instructions (Signed)
[facility] Failure [facility] appointment on June 27, 2016 at 9:00am with Darylene Price, [place]. Please call [phone] to reschedule.

## 2016-06-18 NOTE — H&P (Signed)
[place] at [place] NAME: Andrea [name]    MR#:  [id number]  DATE OF BIRTH:  1945-06-04  DATE OF ADMISSION:  06/17/2016  PRIMARY CARE PHYSICIAN: No PCP Per Patient   REQUESTING/REFERRING PHYSICIAN:   CHIEF COMPLAINT:   Chief Complaint  Patient presents with  . Shortness of Breath    HISTORY OF PRESENT ILLNESS: Andrea [name]  is a 71 y.o. female with a known history of Congestive heart failure, hypertension, hypothyroidism, insulin-dependent diabetes mellitus, peptic ulcer disease, non-alcoholic steatohepatitis presented to the emergency room with increased shortness of breath for the last couple of days. Patient usually uses oxygen at 2 L by nasal cannula at home. Off late she has been increased oxygen to 3 L by nasal cannula. No complaints of any chest pain and palpitations. Patient has been compliant with her Lasix medication at home. Sometimes patient's husband gives extra dose of Lasix to avoid CHF exacerbation. Has history of orthopnea. Patient was found to be in atrial fibrillation which is new for her in the emergency room. Hospitalist service was consulted for further care of the patient. Patient was given Lasix in the emergency room for diuresis and was put on Lanoxin by nasal cannula. She follows up with [facility] cardiology in the community.  PAST MEDICAL HISTORY:   Past Medical History:  Diagnosis Date  . CHF (congestive heart failure) ([place])   . Chronic back pain    Followed by pain clinic in [place]  . Endometriosis   . HTN (hypertension)   . Hypothyroidism   . IDDM (insulin dependent diabetes mellitus) ([place])   . NASH (nonalcoholic steatohepatitis)    has had liver biopsy in past  . Obesity   . PUD (peptic ulcer disease) 1990's    PAST SURGICAL HISTORY: Past Surgical History:  Procedure Laterality Date  . CHOLECYSTECTOMY    . TOTAL ABDOMINAL HYSTERECTOMY W/ BILATERAL SALPINGOOPHORECTOMY      SOCIAL HISTORY:   Social History  Substance Use Topics  . Smoking status: Former Research scientist (life sciences)  . Smokeless tobacco: Never Used     Comment: Quit 1985  . Alcohol use No    FAMILY HISTORY:  Family History  Problem Relation Age of Onset  . COPD Sister   . Cancer Brother   . Emphysema Mother   . Heart disease Father     DRUG ALLERGIES:  Allergies  Allergen Reactions  . Watermelon Concentrate Anaphylaxis  . Ace Inhibitors   . Celebrex [Celecoxib] Rash  . Penicillins Rash  . Sulfa Antibiotics Rash    REVIEW OF SYSTEMS:   CONSTITUTIONAL: No fever, fatigue or weakness.  EYES: No blurred or double vision.  EARS, NOSE, AND THROAT: No tinnitus or ear pain.  RESPIRATORY: No cough, has shortness of breath,  No wheezing or hemoptysis.  CARDIOVASCULAR: No chest pain, orthopnea,   has edema.  GASTROINTESTINAL: No nausea, vomiting, diarrhea or abdominal pain.  GENITOURINARY: No dysuria, hematuria.  ENDOCRINE: No polyuria, nocturia,  HEMATOLOGY: No anemia, easy bruising or bleeding SKIN: No rash or lesion. MUSCULOSKELETAL: No joint pain or arthritis.   NEUROLOGIC: No tingling, numbness, weakness.  PSYCHIATRY: No anxiety or depression.   MEDICATIONS AT HOME:  Prior to Admission medications   Medication Sig Start Date End Date Taking? Authorizing Provider  acetaminophen (TYLENOL) 325 MG tablet Take 650 mg by mouth 3 (three) times daily as needed.     Yes Historical Provider, MD  amLODipine (NORVASC) 10 MG tablet TAKE ONE TABLET  BY MOUTH ONCE DAILY *NEEDS APPOINTMENT FOR FURTHER REFILLS* 02/23/14  Yes Jackolyn Confer, MD  aspirin 81 MG tablet Take 81 mg by mouth daily.   Yes Historical Provider, MD  atorvastatin (LIPITOR) 20 MG tablet TAKE ONE TABLET BY MOUTH ONCE DAILY 12/10/13  Yes Jackolyn Confer, MD  carvedilol (COREG) 6.25 MG tablet Take 6.25 mg by mouth 2 (two) times daily with a meal.   Yes Historical Provider, MD  Cholecalciferol (VITAMIN D HIGH POTENCY PO) Take 1 capsule by mouth daily.     Yes  Historical Provider, MD  clopidogrel (PLAVIX) 75 MG tablet Take 1 tablet (75 mg total) by mouth daily. 12/29/12  Yes Jackolyn Confer, MD  docusate sodium (COLACE) 100 MG capsule Take 100 mg by mouth 2 (two) times daily.   Yes Historical Provider, MD  furosemide (LASIX) 40 MG tablet Take 80 mg by mouth daily.   Yes Historical Provider, MD  gabapentin (NEURONTIN) 800 MG tablet Take 1 tablet (800 mg total) by mouth 2 (two) times daily. Patient taking differently: Take 600 mg by mouth 2 (two) times daily.  03/06/13  Yes Jackolyn Confer, MD  insulin aspart (NOVOLOG) 100 UNIT/ML injection Inject 3-15 Units into the skin 3 (three) times daily before meals. Sliding scale 10/29/12  Yes Jackolyn Confer, MD  Insulin Glargine (TOUJEO SOLOSTAR [place]) Inject 75 Units into the skin daily.   Yes Historical Provider, MD  levothyroxine (SYNTHROID, LEVOTHROID) 150 MCG tablet TAKE ONE TABLET BY MOUTH EVERY DAY Patient taking differently: 175 mcg. TAKE ONE TABLET BY MOUTH EVERY DAY 10/17/12  Yes Jackolyn Confer, MD  lidocaine (LIDODERM) 5 % Place 1 patch onto the skin daily. Remove & Discard patch within 12 hours or as directed by MD 11/21/12  Yes Jackolyn Confer, MD  losartan (COZAAR) 50 MG tablet Take 1 tablet (50 mg total) by mouth daily. Patient taking differently: Take 100 mg by mouth daily.  11/28/12 06/17/16 Yes Jackolyn Confer, MD  Multiple Vitamin (MULTIVITAMIN) tablet Take 1 tablet by mouth daily.   Yes Historical Provider, MD  PARoxetine (PAXIL) 20 MG tablet TAKE ONE TABLET BY MOUTH IN THE MORNING 10/17/12  Yes Jackolyn Confer, MD  polyethylene glycol powder (GLYCOLAX/MIRALAX) powder Take 17 g by mouth 3 (three) times daily as needed. For constipation    Yes Historical Provider, MD  vitamin B-12 (CYANOCOBALAMIN) 1000 MCG tablet Take 1,000 mcg by mouth daily.   Yes Historical Provider, MD      PHYSICAL EXAMINATION:   VITAL SIGNS: Blood pressure (!) 173/68, pulse 67, temperature 98.2 F (36.8 C),  temperature source Oral, resp. rate 20, height 5\' 1"  (1.549 m), weight 88.9 kg (196 lb), SpO2 94 %.  GENERAL:  71 y.o.-year-old patient lying in the bed with no acute distress.  EYES: Pupils equal, round, reactive to light and accommodation. No scleral icterus. Extraocular muscles intact.  HEENT: Head atraumatic, normocephalic. Oropharynx and nasopharynx clear.  NECK:  Supple, no jugular venous distention. No thyroid enlargement, no tenderness.  LUNGS: Decreased breath sounds bilaterally, basal crepitations heard. No use of accessory muscles of respiration.  CARDIOVASCULAR: S1, S2 irregular. No murmurs, rubs, or gallops.  ABDOMEN: Soft, nontender, nondistended. Bowel sounds present. No organomegaly or mass.  EXTREMITIES: Has 1 plus pedal edema, no cyanosis, or clubbing.  NEUROLOGIC: Cranial nerves II through XII are intact. Muscle strength 5/5 in all extremities. Sensation intact. Gait not checked.  PSYCHIATRIC: The patient is alert and oriented x 3.  SKIN:  No obvious rash, lesion, or ulcer.   LABORATORY PANEL:   CBC  Recent Labs Lab 06/17/16 2258  WBC 8.2  HGB 10.7*  HCT 31.7*  PLT 198  MCV 83.1  MCH 27.9  MCHC 33.6  RDW 15.5*   ------------------------------------------------------------------------------------------------------------------  Chemistries   Recent Labs Lab 06/17/16 2258  NA 137  K 4.0  CL 101  CO2 27  GLUCOSE 338*  BUN 46*  CREATININE 1.95*  CALCIUM 8.2*  AST 20  ALT 10*  ALKPHOS 109  BILITOT 0.7   ------------------------------------------------------------------------------------------------------------------ estimated creatinine clearance is 27.2 mL/min (by C-G formula based on SCr of 1.95 mg/dL (H)). ------------------------------------------------------------------------------------------------------------------ No results for input(s): TSH, T4TOTAL, T3FREE, THYROIDAB in the last 72 hours.  Invalid input(s): FREET3   Coagulation  profile  Recent Labs Lab 06/18/16 0129  INR 0.99   ------------------------------------------------------------------------------------------------------------------- No results for input(s): DDIMER in the last 72 hours. -------------------------------------------------------------------------------------------------------------------  Cardiac Enzymes  Recent Labs Lab 06/17/16 2258  TROPONINI <0.03   ------------------------------------------------------------------------------------------------------------------ Invalid input(s): POCBNP  ---------------------------------------------------------------------------------------------------------------  Urinalysis    Component Value Date/Time   COLORURINE Straw 03/08/2014 2035   COLORURINE YELLOW 05/19/2011 1041   APPEARANCEUR Clear 03/08/2014 2035   LABSPEC 1.005 03/08/2014 2035   PHURINE 5.0 03/08/2014 2035   PHURINE 7.5 05/19/2011 1041   GLUCOSEU Negative 03/08/2014 2035   HGBUR Negative 03/08/2014 2035   HGBUR NEGATIVE 05/19/2011 1041   BILIRUBINUR Negative 03/08/2014 2035   KETONESUR Negative 03/08/2014 2035   KETONESUR NEGATIVE 05/19/2011 1041   PROTEINUR 30 mg/dL 03/08/2014 2035   PROTEINUR NEGATIVE 05/19/2011 1041   UROBILINOGEN 0.2 11/21/2012 1601   UROBILINOGEN 0.2 05/19/2011 1041   NITRITE Negative 03/08/2014 2035   NITRITE NEGATIVE 05/19/2011 1041   LEUKOCYTESUR Negative 03/08/2014 2035     RADIOLOGY: Dg Chest 2 View  Result Date: 06/18/2016 CLINICAL DATA:  Shortness of breath EXAM: CHEST  2 VIEW COMPARISON:  03/08/2014 FINDINGS: Tiny pleural effusions on the lateral view. No acute consolidation. There is cardiomegaly and mild central congestion. Atherosclerosis. No pneumothorax. IMPRESSION: 1. Tiny pleural effusions 2. Cardiomegaly with mild central vascular congestion Electronically Signed   By: Donavan Foil M.D.   On: 06/18/2016 00:33    EKG: Orders placed or performed during the hospital encounter of  06/17/16  . ED EKG  . ED EKG  . EKG 12-Lead  . EKG 12-Lead    IMPRESSION AND PLAN: 71 year old female patient with history of congestive heart failure, hypertension, hypothyroidism and insulin-dependent diabetes mellitus, peptic ulcer disease presented to the restroom with increased shortness of breath. Admitting diagnosis 1. Decompensated heart failure 2. New onset atrial fibrillation 3. Uncontrolled diabetes mellitus 4. Hypertension Treatment plan Admit patient to telemetry Diurese patient with IV Lasix 60 MG every 12 hourly Control heart rate with oral beta blocker Control blood sugars with sliding scale coverage Cardiology consultation Cycle troponin check echocardiogram Supportive care.  All the records are reviewed and case discussed with ED provider. Management plans discussed with the patient, family and they are in agreement.  CODE STATUS:FULL CODE Surrogate decision maker : Spouse Code Status History    This patient does not have a recorded code status. Please follow your organizational policy for patients in this situation.       TOTAL TIME TAKING CARE OF THIS PATIENT: 51 minutes.    Saundra Shelling M.D on 06/18/2016 at 2:46 AM  Between 7am to 6pm - Pager - [phone]  After 6pm go to [email or web address] - password [email or web address] [facility] Hospital[facility]  Office  [phone]  CC: Primary care physician; No PCP Per Patient

## 2016-06-18 NOTE — Plan of Care (Signed)
Problem: Safety: Goal: Ability to remain free from injury will improve Outcome: Progressing Fall precautions in place  Problem: Pain Managment: Goal: General experience of comfort will improve Outcome: Progressing Prn medications  Problem: Tissue Perfusion: Goal: Risk factors for ineffective tissue perfusion will decrease Outcome: Progressing Heparin gtt  Problem: Activity: Goal: Capacity to carry out activities will improve Outcome: Progressing Daily weights and strict I&O

## 2016-06-18 NOTE — Care Management (Signed)
Patient admitted with CHF, and new onset CHF.  Patient lives at home with spouse.  Spouse at bedside for assessment.  Patient states that she has home O2 through [employer] and only wears it PRN.  Confirmed with Corene Cornea from [employer] that patient actually has continuous O2 through [employer]. Patient states that she has a standard walker, and elevated toilet seat in the home.  At baseline patient independent of ADL's.  Husband provides transportation. PCP Teena Irani [facility].  Pharmacy [place] Patient is not open to any home health services.  PT consult pending.  Patient states that her son expired 3 weeks ago.  Patient expresses feelings of hopelessness and depression.  Dr. Posey Pronto notified.  Chaplain consult placed.  RNCM following.

## 2016-06-18 NOTE — Progress Notes (Signed)
ANTICOAGULATION CONSULT NOTE - Initial Consult  Pharmacy Consult for heparin Indication: atrial fibrillation      Allergies  Allergen Reactions  . Watermelon Concentrate Anaphylaxis  . Ace Inhibitors   . Celebrex [Celecoxib] Rash  . Penicillins Rash  . Sulfa Antibiotics Rash    Patient Measurements: Height: 5\' 1"  (154.9 cm) Weight: 196 lb (88.9 kg) IBW/kg (Calculated) : 47.8 Heparin Dosing Weight: 68.5 kg  Vital Signs: Temp: 98.2 F (36.8 C) (02/04 2300) Temp Source: Oral (02/04 2300) BP: 172/68 (02/05 0300) Pulse Rate: 64 (02/05 0300)  Labs:  Recent Labs (last 2 labs)    Recent Labs  06/17/16 2258 06/18/16 0129  HGB 10.7*  --   HCT 31.7*  --   PLT 198  --   APTT  --  32  LABPROT  --  13.1  INR  --  0.99  CREATININE 1.95*  --   TROPONINI <0.03  --       Estimated Creatinine Clearance: 27.2 mL/min (by C-G formula based on SCr of 1.95 mg/dL (H)).   Medical History:     Past Medical History:  Diagnosis Date  . CHF (congestive heart failure) ([place])   . Chronic back pain    Followed by pain clinic in [place]  . Endometriosis   . HTN (hypertension)   . Hypothyroidism   . IDDM (insulin dependent diabetes mellitus) ([place])   . NASH (nonalcoholic steatohepatitis)    has had liver biopsy in past  . Obesity   . PUD (peptic ulcer disease) 1990's    Medications:  Infusions:  . heparin      Assessment: 60 yof cc SOB with PMH to include IDDM, CHF, O2 requirements at home. New onset AF in ED. Pharmacy consulted to dose UFH for AF. No PTA OAC listed.  2/5 @ 1140 HL < 0.10 subtherapeutic from goal 0.3 - 0.7   Goal of Therapy:  Heparin level 0.3-0.7 units/ml Monitor platelets by anticoagulation protocol: Yes   Plan:  Give 4000 units bolus x 1 Start heparin infusion at 800 units/hr Check anti-Xa level in 8 hours and daily while on heparin  Continue to monitor H&H and platelets  2/5 Gave 2500 unit bolus x 1 Increasing  infusion to 1000 units/hr Checking anti-xa in 8 hours (2/5 2200) Will continue to monitor heparin level, H/H, and plts.  Thank you for this consult.  Tobie Lords, PharmD, BCPS Clinical Pharmacist 06/18/2016

## 2016-06-18 NOTE — Progress Notes (Signed)
[place] at [place] NAME: Andrea [name]    MR#:  [id number]  DATE OF BIRTH:  1946-01-11  SUBJECTIVE:   Patient doing well. Seems very bothered by her indigestion but otherwise no major complains and no acute events to report.   REVIEW OF SYSTEMS:   Review of Systems  Constitutional: Negative for chills, fever and weight loss.  HENT: Negative for ear pain, hearing loss and tinnitus.   Eyes: Negative for blurred vision, double vision and photophobia.  Respiratory: Positive for shortness of breath. Negative for cough, hemoptysis and sputum production.   Cardiovascular: Positive for orthopnea. Negative for chest pain and palpitations.  Gastrointestinal: Positive for heartburn. Negative for abdominal pain, nausea and vomiting.  Genitourinary: Negative for dysuria, frequency and urgency.  Musculoskeletal: Negative for back pain, myalgias and neck pain.  Skin: Negative for itching and rash.  Neurological: Negative for dizziness, tingling, tremors and headaches.  Endo/Heme/Allergies: Negative for environmental allergies and polydipsia. Does not bruise/bleed easily.  Psychiatric/Behavioral: Negative for depression, substance abuse and suicidal ideas.    DRUG ALLERGIES:   Allergies  Allergen Reactions  . Watermelon Concentrate Anaphylaxis  . Ace Inhibitors   . Celebrex [Celecoxib] Rash  . Penicillins Rash  . Sulfa Antibiotics Rash    VITALS:  Blood pressure 121/88, pulse 68, temperature 97.5 F (36.4 C), temperature source Oral, resp. rate 18, height 5\' 1"  (1.549 m), weight 90.7 kg (199 lb 14.4 oz), SpO2 96 %.  PHYSICAL EXAMINATION:   Physical Exam  GENERAL:  71 y.o.-year-old patient lying in the bed with no acute distress.  EYES: Pupils equal, round, reactive to light and accommodation. No scleral icterus. Extraocular muscles intact.  HEENT: Head atraumatic, normocephalic. Oropharynx and nasopharynx clear.  NECK:  Supple, no  jugular venous distention. No thyroid enlargement, no tenderness.  LUNGS: Normal breath sounds bilaterally, no wheezing, rales, rhonchi. No use of accessory muscles of respiration.  CARDIOVASCULAR: S1, S2 normal. No murmurs, rubs, or gallops.  ABDOMEN: Soft, nontender, nondistended. Bowel sounds present. No organomegaly or mass.  EXTREMITIES: No cyanosis, clubbing or edema b/l.    NEUROLOGIC: Cranial nerves II through XII are intact. No focal Motor or sensory deficits b/l.   PSYCHIATRIC:  patient is alert and oriented x 3.  SKIN: No obvious rash, lesion, or ulcer.   LABORATORY PANEL:  CBC  Recent Labs Lab 06/17/16 2258  WBC 8.2  HGB 10.7*  HCT 31.7*  PLT 198    Chemistries   Recent Labs Lab 06/17/16 2258  NA 137  K 4.0  CL 101  CO2 27  GLUCOSE 338*  BUN 46*  CREATININE 1.95*  CALCIUM 8.2*  AST 20  ALT 10*  ALKPHOS 109  BILITOT 0.7   Cardiac Enzymes  Recent Labs Lab 06/18/16 0414  TROPONINI <0.03   RADIOLOGY:  Dg Chest 2 View  Result Date: 06/18/2016 CLINICAL DATA:  Shortness of breath EXAM: CHEST  2 VIEW COMPARISON:  03/08/2014 FINDINGS: Tiny pleural effusions on the lateral view. No acute consolidation. There is cardiomegaly and mild central congestion. Atherosclerosis. No pneumothorax. IMPRESSION: 1. Tiny pleural effusions 2. Cardiomegaly with mild central vascular congestion Electronically Signed   By: Donavan Foil M.D.   On: 06/18/2016 00:33   ASSESSMENT AND PLAN:   71yo female with a history of congestive heart failure, hypertension, hypothyroidism, diabetes, and hyperlipidemia who was admitted with progressive shortness of breath. She is on 3L O2 by nasal canula as needed at home. Was noted  to have new onset atrial fibrillation in the ED.   1) Acute diastolic congestive heart failure (from echo done at [facility] about 1.5 years ago showing preserved LV function) - Continue gentle diuresis with Lasix 60mg  every 12 hours  - Echo pending - Appreciate input from  cardiology  2) New onset atrial fibrillation  - Rate well controlled. Continue Carvedilol - Continue Heparin  - GFR currently 25. Conversion to a normal oral agent will depend on renal function moving forward. Repeat metabolic panel in the AM  3) Uncontrolled diabetes - Continue Novolog sliding scale coverage    4) Indigestion  - Started on Maalox 75mL every 4 hours as needed   Case discussed with Care Management/Social Worker. Management plans discussed with the patient, family and they are in agreement.  CODE STATUS: full  DVT Prophylaxis: Heparin drip  TOTAL TIME TAKING CARE OF THIS PATIENT: 30 minutes.  >50% time spent on counselling and coordination of care  POSSIBLE D/C IN 1 DAYS, DEPENDING ON CLINICAL CONDITION.    Eloise Levels,  Physician Assistant Student    This is a Ship broker note, solely for education purposes. This note was reviewed with and approved by attending preceptor.  CC: Primary care physician; No PCP Per Patient

## 2016-06-18 NOTE — Progress Notes (Signed)
Chaplain went to visit the Pt. As a result of an order from the Pt.'s nurse. Pt. Was with her husband and a neighbor who visited them. 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 She was in tears as she described the ordeal she has been going through due to her loss.

## 2016-06-18 NOTE — Progress Notes (Signed)
Pt arrived from ED alert and oriented. No c/o pain. Pt has labored breathing especially on exertion. O2 at 96% on 3L . Telemetry box verified with Harriette Bouillon  and skin verified with [employer] . No skin issues. Heparin drip started at 8 ml/h with a 4000 unit bolus per MD order r/t new onset a-fib. 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

## 2016-06-18 NOTE — Consult Note (Signed)
[place]  CARDIOLOGY CONSULT NOTE  Patient ID: Andrea [name] MRN: [id number] DOB/AGE: [name] 08, 1947 71 y.o.  Admit date: 06/17/2016 Referring Physician Dr. Fritzi Mandes Primary Physician [facility] Primary Cardiologist Dr. Nehemiah Massed remotely Reason for Consultation afib/chf  HPI: Patient is a 71 year old female with history of congestive heart failure with echocardiogram done at [facility] in 2016 showing preserved LV function, history of hypertension, history of insulin-dependent diabetes mellitus who was admitted with progressive shortness of breath. She was noted to be in atrial fibrillation which had not been noted in the chart previously. She has seen Dr. Nehemiah Massed in the remote past but has not seen him recently she is on carvedilol and losartan as well as when necessary alterations in the furosemide. She attempts to eat a low-sodium diet. She states she has been short of breath for several years but it worsened recently. She presented to the emergency room where she was noted to have mild pulmonary edema. She was also noted to be in atrial fibrillation which appears to be new for her. Rate was well-controlled. She ruled out for myocardial infarction with a normal troponin. BNP was 388. Creatinine was 1.95. 2 years ago was 1.24. Creatinine was 1.74 in October 2017 in her primary care doctor's office [place]. She is on dual antiplatelet therapy for her carotid disease. She is on atorvastatin for hyperlipidemia.  Review of Systems  Constitutional: Negative.   HENT: Negative.   Eyes: Negative.   Respiratory: Positive for shortness of breath.   Cardiovascular: Positive for palpitations and leg swelling.  Gastrointestinal: Negative.   Genitourinary: Negative.   Musculoskeletal: Negative.   Skin: Negative.   Neurological: Negative.   Endo/Heme/Allergies: Negative.   Psychiatric/Behavioral: Negative.     Past Medical History:  Diagnosis Date  . CHF (congestive  heart failure) ([place])   . Chronic back pain    Followed by pain clinic in [place]  . Endometriosis   . HTN (hypertension)   . Hypothyroidism   . IDDM (insulin dependent diabetes mellitus) ([place])   . NASH (nonalcoholic steatohepatitis)    has had liver biopsy in past  . Obesity   . PUD (peptic ulcer disease) 99's    Family History  Problem Relation Age of Onset  . COPD Sister   . Cancer Brother   . Emphysema Mother   . Heart disease Father     Social History   Social History  . Marital status: Married    Spouse name: N/A  . Number of children: N/A  . Years of education: N/A   Occupational History  . retired    Social History Main Topics  . Smoking status: Former Research scientist (life sciences)  . Smokeless tobacco: Never Used     Comment: Quit 1985  . Alcohol use No  . Drug use: No  . Sexual activity: Not on file   Other Topics Concern  . Not on file   Social History Narrative   Lives with husband    Past Surgical History:  Procedure Laterality Date  . CHOLECYSTECTOMY    . TOTAL ABDOMINAL HYSTERECTOMY W/ BILATERAL SALPINGOOPHORECTOMY       Prescriptions Prior to Admission  Medication Sig Dispense Refill Last Dose  . acetaminophen (TYLENOL) 325 MG tablet Take 650 mg by mouth 3 (three) times daily as needed.     Taking  . amLODipine (NORVASC) 10 MG tablet TAKE ONE TABLET BY MOUTH ONCE DAILY *NEEDS APPOINTMENT FOR FURTHER REFILLS* 30 tablet 0   . aspirin  81 MG tablet Take 81 mg by mouth daily.   Taking  . atorvastatin (LIPITOR) 20 MG tablet TAKE ONE TABLET BY MOUTH ONCE DAILY 90 tablet 0   . carvedilol (COREG) 6.25 MG tablet Take 6.25 mg by mouth 2 (two) times daily with a meal.     . Cholecalciferol (VITAMIN D HIGH POTENCY PO) Take 1 capsule by mouth daily.     Taking  . clopidogrel (PLAVIX) 75 MG tablet Take 1 tablet (75 mg total) by mouth daily. 30 tablet 6 Taking  . docusate sodium (COLACE) 100 MG capsule Take 100 mg by mouth 2 (two) times daily.   Taking  . furosemide (LASIX)  40 MG tablet Take 80 mg by mouth daily.     Marland Kitchen gabapentin (NEURONTIN) 800 MG tablet Take 1 tablet (800 mg total) by mouth 2 (two) times daily. (Patient taking differently: Take 600 mg by mouth 2 (two) times daily. ) 180 tablet 3   . insulin aspart (NOVOLOG) 100 UNIT/ML injection Inject 3-15 Units into the skin 3 (three) times daily before meals. Sliding scale 1 vial 3 Taking  . Insulin Glargine (TOUJEO SOLOSTAR [place]) Inject 75 Units into the skin daily.     Marland Kitchen levothyroxine (SYNTHROID, LEVOTHROID) 150 MCG tablet TAKE ONE TABLET BY MOUTH EVERY DAY (Patient taking differently: 175 mcg. TAKE ONE TABLET BY MOUTH EVERY DAY) 90 tablet 1 Taking  . lidocaine (LIDODERM) 5 % Place 1 patch onto the skin daily. Remove & Discard patch within 12 hours or as directed by MD 30 patch 0 Taking  . losartan (COZAAR) 50 MG tablet Take 1 tablet (50 mg total) by mouth daily. (Patient taking differently: Take 100 mg by mouth daily. ) 30 tablet 6 Taking  . Multiple Vitamin (MULTIVITAMIN) tablet Take 1 tablet by mouth daily.   Taking  . PARoxetine (PAXIL) 20 MG tablet TAKE ONE TABLET BY MOUTH IN THE MORNING 30 tablet 4 Taking  . polyethylene glycol powder (GLYCOLAX/MIRALAX) powder Take 17 g by mouth 3 (three) times daily as needed. For constipation    Taking  . vitamin B-12 (CYANOCOBALAMIN) 1000 MCG tablet Take 1,000 mcg by mouth daily.   Taking    Physical Exam: Blood pressure (!) 141/71, pulse 60, temperature 97.9 F (36.6 C), temperature source Oral, resp. rate 16, height 5\' 1"  (1.549 m), weight 199 lb 14.4 oz (90.7 kg), SpO2 95 %.   Wt Readings from Last 1 Encounters:  06/18/16 199 lb 14.4 oz (90.7 kg)     General appearance: alert and cooperative Resp: rhonchi bibasilar Cardio: irregularly irregular rhythm GI: soft, non-tender; bowel sounds normal; no masses,  no organomegaly Extremities: extremities normal, atraumatic, no cyanosis or edema Neurologic: Grossly normal  Labs:   Lab Results  Component Value Date    WBC 8.2 06/17/2016   HGB 10.7 (L) 06/17/2016   HCT 31.7 (L) 06/17/2016   MCV 83.1 06/17/2016   PLT 198 06/17/2016    Recent Labs Lab 06/17/16 2258  NA 137  K 4.0  CL 101  CO2 27  BUN 46*  CREATININE 1.95*  CALCIUM 8.2*  PROT 6.4*  BILITOT 0.7  ALKPHOS 109  ALT 10*  AST 20  GLUCOSE 338*   Lab Results  Component Value Date   CKTOTAL 51 02/06/2014   CKMB 1.8 02/06/2014   TROPONINI <0.03 06/18/2016      Radiology: Mild pulmonary edema EKG: Atrial fibrillation with controlled ventricular response  ASSESSMENT AND PLAN:  Patient is a 71 year old female with history of  diabetes, hypertension, hyperlipidemia who was admitted with progressive shortness of breath and mild pulmonary edema. Echo done at [facility] approximately year and half ago showed preserved LV function. She was noted to be in atrial fibrillation with was apparently new for her. Her chads score is 5. She is currently on heparin.   Atrial fibrillation  Rate is well controlled. Currently on heparin. Will continue with heparin. Conversion to a normal or agent will depend on the renal function. Her GFR is currently 25. She is less than 80 and greater than 60 kg. Would use Eliquis at 2.5 mg twice daily  CHF-  Probable diastolic dysfunction-continue gentle diuresis and continue with carvedilol and losartan. Follow renal function. Echo to follow up LV function.  Signed: Teodoro Spray MD, [facility] 06/18/2016, 11:19 AM

## 2016-06-18 NOTE — ED Notes (Signed)
Pt back from x-ray.

## 2016-06-18 NOTE — ED Provider Notes (Signed)
[facility] Emergency Department Provider Note  ____________________________________________   First MD Initiated Contact with Patient 06/18/16 0032     (approximate)  I have reviewed the triage vital signs and the nursing notes.   HISTORY  Chief Complaint Shortness of Breath    HPI Andrea [name] is a 71 y.o. female with extensive chronic medical issues including insulin-dependent diabetes and congestive heart failureand oxygen requirement of 2 L at home who presents for evaluation of worsening shortness of breath.  This is been gradual in onset over the last month during which time she was dealing with the sudden death of her adult son.  However she notes that over the last week it has gotten more severe with chest pain that occurs with any amount of exertion, severe shortness of breath with exertion including just simply getting up to go to the bathroom, and she has had to turn up her oxygen to 3 L all the time and instead of intermittently using 2 L at home.  They have not seen her regular doctor in [place] recently.  She has no history of atrial fibrillation.  Her husband has been making sure she is taking her prescribed Lasix 80 mg daily, but he frequently gives her an extra dose as well to make sure she does not develop a CHF exacerbation.  Exertion makes her shortness of breath and chest pain worse, rest makes it better, as does increased oxygen by nasal cannula.  She describes the chest pain as sharp and stabbing in the middle of her chest and it is mild to moderate in intensity.  She denies fever/chills, abdominal pain, nausea, vomiting.  Past Medical History:  Diagnosis Date  . CHF (congestive heart failure) ([place])   . Chronic back pain    Followed by pain clinic in [place]  . Endometriosis   . HTN (hypertension)   . Hypothyroidism   . IDDM (insulin dependent diabetes mellitus) ([place])   . NASH (nonalcoholic steatohepatitis)    has had liver  biopsy in past  . Obesity   . PUD (peptic ulcer disease) 1990's    Patient Active Problem List   Diagnosis Date Noted  . GERD (gastroesophageal reflux disease) 03/07/2013  . Other and unspecified hyperlipidemia 03/07/2013  . Dizziness and giddiness 11/21/2012  . Urinary incontinence 09/11/2012  . Chronic low back pain 07/30/2012  . Anemia 07/30/2012  . Noncompliance 07/30/2012  . Neuropathy ([place]) 01/12/2012  . Carotid stenosis, right 11/29/2011  . Diabetes mellitus type 2, uncontrolled ([place]) 11/29/2011  . Hypertension 11/29/2011  . Renal artery stenosis ([place]) 06/04/2011  . Constipation, chronic 06/04/2011  . Hypothyroidism 04/11/2011    Past Surgical History:  Procedure Laterality Date  . CHOLECYSTECTOMY    . TOTAL ABDOMINAL HYSTERECTOMY W/ BILATERAL SALPINGOOPHORECTOMY      Prior to Admission medications   Medication Sig Start Date End Date Taking? Authorizing Provider  acetaminophen (TYLENOL) 325 MG tablet Take 650 mg by mouth 3 (three) times daily as needed.     Yes Historical Provider, MD  amLODipine (NORVASC) 10 MG tablet TAKE ONE TABLET BY MOUTH ONCE DAILY *NEEDS APPOINTMENT FOR FURTHER REFILLS* 02/23/14  Yes Jackolyn Confer, MD  aspirin 81 MG tablet Take 81 mg by mouth daily.   Yes Historical Provider, MD  atorvastatin (LIPITOR) 20 MG tablet TAKE ONE TABLET BY MOUTH ONCE DAILY 12/10/13  Yes Jackolyn Confer, MD  carvedilol (COREG) 6.25 MG tablet Take 6.25 mg by mouth 2 (two) times daily  with a meal.   Yes Historical Provider, MD  Cholecalciferol (VITAMIN D HIGH POTENCY PO) Take 1 capsule by mouth daily.     Yes Historical Provider, MD  clopidogrel (PLAVIX) 75 MG tablet Take 1 tablet (75 mg total) by mouth daily. 12/29/12  Yes Jackolyn Confer, MD  docusate sodium (COLACE) 100 MG capsule Take 100 mg by mouth 2 (two) times daily.   Yes Historical Provider, MD  furosemide (LASIX) 40 MG tablet Take 80 mg by mouth daily.   Yes Historical Provider, MD  gabapentin  (NEURONTIN) 800 MG tablet Take 1 tablet (800 mg total) by mouth 2 (two) times daily. Patient taking differently: Take 600 mg by mouth 2 (two) times daily.  03/06/13  Yes Jackolyn Confer, MD  insulin aspart (NOVOLOG) 100 UNIT/ML injection Inject 3-15 Units into the skin 3 (three) times daily before meals. Sliding scale 10/29/12  Yes Jackolyn Confer, MD  levothyroxine (SYNTHROID, LEVOTHROID) 150 MCG tablet TAKE ONE TABLET BY MOUTH EVERY DAY Patient taking differently: 175 mcg. TAKE ONE TABLET BY MOUTH EVERY DAY 10/17/12  Yes Jackolyn Confer, MD  lidocaine (LIDODERM) 5 % Place 1 patch onto the skin daily. Remove & Discard patch within 12 hours or as directed by MD 11/21/12  Yes Jackolyn Confer, MD  losartan (COZAAR) 50 MG tablet Take 1 tablet (50 mg total) by mouth daily. Patient taking differently: Take 100 mg by mouth daily.  11/28/12 06/17/16 Yes Jackolyn Confer, MD  Multiple Vitamin (MULTIVITAMIN) tablet Take 1 tablet by mouth daily.   Yes Historical Provider, MD  PARoxetine (PAXIL) 20 MG tablet TAKE ONE TABLET BY MOUTH IN THE MORNING 10/17/12  Yes Jackolyn Confer, MD  polyethylene glycol powder (GLYCOLAX/MIRALAX) powder Take 17 g by mouth 3 (three) times daily as needed. For constipation    Yes Historical Provider, MD  vitamin B-12 (CYANOCOBALAMIN) 1000 MCG tablet Take 1,000 mcg by mouth daily.   Yes Historical Provider, MD    Allergies Watermelon concentrate; Ace inhibitors; Celebrex [celecoxib]; Penicillins; and Sulfa antibiotics  Family History  Problem Relation Age of Onset  . COPD Sister   . Cancer Brother     Social History Social History  Substance Use Topics  . Smoking status: Former Research scientist (life sciences)  . Smokeless tobacco: Never Used     Comment: Quit 1985  . Alcohol use No    Review of Systems Constitutional: No fever/chills Eyes: No visual changes. ENT: No sore throat. Cardiovascular: +chest pain with exertion Respiratory: +shortness of breath, at rest and much worse with  exertion Gastrointestinal: No abdominal pain.  No nausea, no vomiting.  No diarrhea.  No constipation. Genitourinary: Negative for dysuria. Musculoskeletal: Negative for back pain. Skin: Negative for rash. Neurological: Negative for headaches, focal weakness or numbness.  10-point ROS otherwise negative.  ____________________________________________   PHYSICAL EXAM:  VITAL SIGNS: ED Triage Vitals  Enc Vitals Group     BP 06/17/16 2300 (!) 163/61     Pulse Rate 06/17/16 2300 69     Resp 06/17/16 2300 20     Temp 06/17/16 2300 98.2 F (36.8 C)     Temp Source 06/17/16 2300 Oral     SpO2 06/17/16 2300 94 %     Weight 06/17/16 2257 196 lb (88.9 kg)     Height 06/17/16 2257 5\' 1"  (1.549 m)     Head Circumference --      Peak Flow --      Pain Score --  Pain Loc --      Pain Edu? --      Excl. in [place]? --     Constitutional: Alert and oriented. Appears chronically ill but not in acute distress Eyes: Conjunctivae are normal. PERRL. EOMI. Head: Atraumatic. Nose: No congestion/rhinnorhea. Mouth/Throat: Mucous membranes are moist.  Oropharynx non-erythematous. Neck: No stridor.  No meningeal signs.   Cardiovascular: Normal rate, irregular rhythm. Good peripheral circulation. Grossly normal heart sounds. Respiratory: Normal respiratory effort.  No retractions. Lungs CTAB. Gastrointestinal: Soft and nontender. No distention.  Musculoskeletal: No lower extremity tenderness nor edema. No gross deformities of extremities. Neurologic:  Normal speech and language. No gross focal neurologic deficits are appreciated.  Skin:  Skin is warm, dry and intact. No rash noted. Psychiatric: Mood and affect are normal. Speech and behavior are normal.  ____________________________________________   LABS (all labs ordered are listed, but only abnormal results are displayed)  Labs Reviewed  BASIC METABOLIC PANEL - Abnormal; Notable for the following:       Result Value   Glucose, Bld 338  (*)    BUN 46 (*)    Creatinine, Ser 1.95 (*)    Calcium 8.2 (*)    GFR calc non Af Amer 25 (*)    GFR calc Af Amer 29 (*)    All other components within normal limits  CBC - Abnormal; Notable for the following:    Hemoglobin 10.7 (*)    HCT 31.7 (*)    RDW 15.5 (*)    All other components within normal limits  BRAIN NATRIURETIC PEPTIDE - Abnormal; Notable for the following:    B Natriuretic Peptide 388.0 (*)    All other components within normal limits  HEPATIC FUNCTION PANEL - Abnormal; Notable for the following:    Total Protein 6.4 (*)    Albumin 3.2 (*)    ALT 10 (*)    Bilirubin, Direct <0.1 (*)    All other components within normal limits  TROPONIN I  APTT  PROTIME-INR   ____________________________________________  EKG  ED ECG REPORT I, Yvonne Petite, the attending physician, personally viewed and interpreted this ECG.  Date: 06/17/2016 EKG Time: 22:59 Rate: 62 Rhythm: Atrial fibrillation QRS Axis: normal Intervals: Abnormal due to atrial fibrillation ST/T Wave abnormalities: Non-specific ST segment / T-wave changes, but no evidence of acute ischemia. Conduction Disturbances: none Narrative Interpretation: unremarkable  ____________________________________________  RADIOLOGY   Dg Chest 2 View  Result Date: 06/18/2016 CLINICAL DATA:  Shortness of breath EXAM: CHEST  2 VIEW COMPARISON:  03/08/2014 FINDINGS: Tiny pleural effusions on the lateral view. No acute consolidation. There is cardiomegaly and mild central congestion. Atherosclerosis. No pneumothorax. IMPRESSION: 1. Tiny pleural effusions 2. Cardiomegaly with mild central vascular congestion Electronically Signed   By: Donavan Foil M.D.   On: 06/18/2016 00:33    ____________________________________________   PROCEDURES  Procedure(s) performed:   Procedures   Critical Care performed: No ____________________________________________   INITIAL IMPRESSION / ASSESSMENT AND PLAN / ED  COURSE  Pertinent labs & imaging results that were available during my care of the patient were reviewed by me and considered in my medical decision making (see chart for details).  The patient's creatinine is up to about 2 with a GFR down to the low 20s likely due to the extra doses of Lasix she has been getting at home.  There is no evidence of acute CHF exacerbation, in fact I believe she is volume depleted and I am giving her a small fluid  bolus.  I suspect her worsening shortness of breath and chest pain particularly with exertion or due to her new onset atrial fibrillation.  I believe she would benefit from an inpatient evaluation including cardiology consult and echocardiogram as well as medication management of her A. fib and her other comorbid medical conditions.  I have discussed it with the patient and family and they understand and agree.  I discussed with the hospitalist by phone and she will admit.      ____________________________________________  FINAL CLINICAL IMPRESSION(S) / ED DIAGNOSES  Final diagnoses:  New onset atrial fibrillation (HCC)  Dyspnea on exertion  Chest pain, unspecified type  Acute renal insufficiency  Type 2 diabetes mellitus with hyperglycemia, with long-term current use of insulin (HCC)     MEDICATIONS GIVEN DURING THIS VISIT:  Medications  sodium chloride 0.9 % bolus 500 mL (not administered)     NEW OUTPATIENT MEDICATIONS STARTED DURING THIS VISIT:  New Prescriptions   No medications on file    Modified Medications   No medications on file    Discontinued Medications   ARTIFICIAL SALIVA SOLN    Use as directed 1 application in the mouth or throat as needed.   CLONIDINE (CATAPRES) 0.1 MG TABLET    Take 1 tablet (0.1 mg total) by mouth 2 (two) times daily.   CYCLOBENZAPRINE (FLEXERIL) 5 MG TABLET    Take 1 tablet (5 mg total) by mouth 3 (three) times daily as needed for muscle spasms.   INSULIN ASPART PROTAMINE- ASPART (NOVOLOG 70/30)  (70-30) 100 UNIT/ML INJECTION    Inject 0.6 mLs (60 Units total) into the skin 2 (two) times daily with a meal.   IRON PO    Take by mouth.   METOPROLOL (LOPRESSOR) 50 MG TABLET    Take 1 tablet (50 mg total) by mouth 2 (two) times daily.   PANTOPRAZOLE (PROTONIX) 40 MG TABLET    Take 1 tablet (40 mg total) by mouth daily.     Note:  This document was prepared using Dragon voice recognition software and [place] include unintentional dictation errors.    Hinda Kehr, MD 06/18/16 [phone]

## 2016-06-18 NOTE — Progress Notes (Signed)
Inpatient Diabetes Program Recommendations  AACE/ADA: New Consensus Statement on Inpatient Glycemic Control (2015)  Target Ranges:  Prepandial:   less than 140 mg/dL      Peak postprandial:   less than 180 mg/dL (1-2 hours)      Critically ill patients:  140 - 180 mg/dL   Review of Glycemic Control  Diabetes history: DM 2 Outpatient Diabetes medications: Toujeo 75 units, Novolog 3-15 units TID Current orders for Inpatient glycemic control: Novolog Moderate + HS scale  Inpatient Diabetes Program Recommendations:   Admitting lab glucose 338 mg/dl. Fasting glucose this am 176. Patient takes a large dose of basal insulin at home. If glucose trends increase, please consider Lantus 18 units (0.2 units/kg) while inpatient. Consider obtaining an A1c level to assess glucose control over the past 2-3 months.   Thanks,  Tama Headings RN, MSN, [facility] Inpatient Diabetes Coordinator Team Pager [phone] (8a-5p)

## 2016-06-18 NOTE — Progress Notes (Signed)
Initial HF Clinic appointment scheduled on June 27, 2016 at 9:00am. Thank you.

## 2016-06-18 NOTE — Progress Notes (Signed)
ANTICOAGULATION CONSULT NOTE - Initial Consult  Pharmacy Consult for heparin Indication: atrial fibrillation      Allergies  Allergen Reactions  . Watermelon Concentrate Anaphylaxis  . Ace Inhibitors   . Celebrex [Celecoxib] Rash  . Penicillins Rash  . Sulfa Antibiotics Rash    Patient Measurements: Height: 5\' 1"  (154.9 cm) Weight: 196 lb (88.9 kg) IBW/kg (Calculated) : 47.8 Heparin Dosing Weight: 68.5 kg  Vital Signs: Temp: 98.2 F (36.8 C) (02/04 2300) Temp Source: Oral (02/04 2300) BP: 172/68 (02/05 0300) Pulse Rate: 64 (02/05 0300)  Labs:  Recent Labs (last 2 labs)    Recent Labs  06/17/16 2258 06/18/16 0129  HGB 10.7*  --   HCT 31.7*  --   PLT 198  --   APTT  --  32  LABPROT  --  13.1  INR  --  0.99  CREATININE 1.95*  --   TROPONINI <0.03  --       Estimated Creatinine Clearance: 27.2 mL/min (by C-G formula based on SCr of 1.95 mg/dL (H)).   Medical History:     Past Medical History:  Diagnosis Date  . CHF (congestive heart failure) ([place])   . Chronic back pain    Followed by pain clinic in [place]  . Endometriosis   . HTN (hypertension)   . Hypothyroidism   . IDDM (insulin dependent diabetes mellitus) ([place])   . NASH (nonalcoholic steatohepatitis)    has had liver biopsy in past  . Obesity   . PUD (peptic ulcer disease) 1990's    Medications:  Infusions:  . heparin      Assessment: 46 yof cc SOB with PMH to include IDDM, CHF, O2 requirements at home. New onset AF in ED. Pharmacy consulted to dose UFH for AF. No PTA OAC listed.  2/5 @ 1140 HL < 0.10 subtherapeutic from goal 0.3 - 0.7   Goal of Therapy:  Heparin level 0.3-0.7 units/ml Monitor platelets by anticoagulation protocol: Yes   Plan:  Give 4000 units bolus x 1 Start heparin infusion at 800 units/hr Check anti-Xa level in 8 hours and daily while on heparin  Continue to monitor H&H and platelets  2/5 Gave 2500 unit bolus x 1 Increasing  infusion to 1000 units/hr Checking anti-xa in 8 hours (2/5 2200) Will continue to monitor heparin level, H/H, and plts.   0205 22:00 heparin level <0.1. 2100 unit bolus and increase rate to 1250 units/hr. Recheck in 8 hours.   Thank you for this consult.  Tobie Lords, PharmD, BCPS Clinical Pharmacist 06/18/2016

## 2016-06-19 ENCOUNTER — Inpatient Hospital Stay
Admit: 2016-06-19 | Discharge: 2016-06-19 | Disposition: A | Discharge status: Home / Self Care | Payer: Medicare HMO | Attending: Internal Medicine | Admitting: Internal Medicine

## 2016-06-19 LAB — CBC
HCT: 26.8 % — ABNORMAL LOW (ref 35.0–47.0)
Hemoglobin: 9 g/dL — ABNORMAL LOW (ref 12.0–16.0)
MCH: 27.8 pg (ref 26.0–34.0)
MCHC: 33.5 g/dL (ref 32.0–36.0)
MCV: 83.2 fL (ref 80.0–100.0)
PLATELETS: 158 10*3/uL (ref 150–440)
RBC: 3.23 MIL/uL — ABNORMAL LOW (ref 3.80–5.20)
RDW: 15.1 % — AB (ref 11.5–14.5)
WBC: 7.6 10*3/uL (ref 3.6–11.0)

## 2016-06-19 LAB — ECHOCARDIOGRAM COMPLETE
HEIGHTINCHES: 61 in
Weight: 3161.6 oz

## 2016-06-19 LAB — BASIC METABOLIC PANEL
Anion gap: 8 (ref 5–15)
BUN: 46 mg/dL — AB (ref 6–20)
CO2: 28 mmol/L (ref 22–32)
CREATININE: 1.78 mg/dL — AB (ref 0.44–1.00)
Calcium: 8.6 mg/dL — ABNORMAL LOW (ref 8.9–10.3)
Chloride: 104 mmol/L (ref 101–111)
GFR calc Af Amer: 32 mL/min — ABNORMAL LOW (ref >60)
GFR, EST NON AFRICAN AMERICAN: 28 mL/min — AB (ref >60)
Glucose, Bld: 133 mg/dL — ABNORMAL HIGH (ref 65–99)
Potassium: 4.5 mmol/L (ref 3.5–5.1)
SODIUM: 140 mmol/L (ref 135–145)

## 2016-06-19 LAB — GLUCOSE, CAPILLARY
Glucose-Capillary: 108 mg/dL — ABNORMAL HIGH (ref 65–99)
Glucose-Capillary: 170 mg/dL — ABNORMAL HIGH (ref 65–99)

## 2016-06-19 LAB — HEPARIN LEVEL (UNFRACTIONATED): Heparin Unfractionated: 0.27 IU/mL — ABNORMAL LOW (ref 0.30–0.70)

## 2016-06-19 MED ORDER — FUROSEMIDE 40 MG PO TABS
40.0000 mg | ORAL_TABLET | Freq: Two times a day (BID) | ORAL | Status: DC
  Administered 2016-06-19: 40 mg via ORAL
  Filled 2016-06-19: qty 1

## 2016-06-19 MED ORDER — HEPARIN BOLUS VIA INFUSION
1300.0000 [IU] | Freq: Once | INTRAVENOUS | Status: AC
  Administered 2016-06-19: 1300 [IU] via INTRAVENOUS
  Filled 2016-06-19: qty 1300

## 2016-06-19 MED ORDER — INSULIN GLARGINE 300 UNIT/ML ~~LOC~~ SOPN: 60.0000 [IU] | PEN_INJECTOR | Freq: Every day | SUBCUTANEOUS | 0 refills | Status: DC

## 2016-06-19 MED ORDER — HEPARIN (PORCINE) IN NACL 100-0.45 UNIT/ML-% IJ SOLN
1400.0000 [IU]/h | INTRAMUSCULAR | Status: DC
  Administered 2016-06-19: 1400 [IU]/h via INTRAVENOUS

## 2016-06-19 NOTE — Progress Notes (Signed)
ANTICOAGULATION CONSULT NOTE - Initial Consult  Pharmacy Consult for heparin Indication: atrial fibrillation      Allergies  Allergen Reactions  . Watermelon Concentrate Anaphylaxis  . Ace Inhibitors   . Celebrex [Celecoxib] Rash  . Penicillins Rash  . Sulfa Antibiotics Rash    Patient Measurements: Height: 5\' 1"  (154.9 cm) Weight: 196 lb (88.9 kg) IBW/kg (Calculated) : 47.8 Heparin Dosing Weight: 68.5 kg  Vital Signs: Temp: 98.2 F (36.8 C) (02/04 2300) Temp Source: Oral (02/04 2300) BP: 172/68 (02/05 0300) Pulse Rate: 64 (02/05 0300)  Labs:  Recent Labs (last 2 labs)    Recent Labs  06/17/16 2258 06/18/16 0129  HGB 10.7* --   HCT 31.7* --   PLT 198 --   APTT --  32  LABPROT --  13.1  INR --  0.99  CREATININE 1.95* --   TROPONINI <0.03 --       Estimated Creatinine Clearance: 27.2 mL/min (by C-G formula based on SCr of 1.95 mg/dL (H)).   Medical History:     Past Medical History:  Diagnosis Date  . CHF (congestive heart failure) ([place])   . Chronic back pain    Followed by pain clinic in [place]  . Endometriosis   . HTN (hypertension)   . Hypothyroidism   . IDDM (insulin dependent diabetes mellitus) ([place])   . NASH (nonalcoholic steatohepatitis)    has had liver biopsy in past  . Obesity   . PUD (peptic ulcer disease) 1990's    Medications:  Infusions:  . heparin     Assessment: 42 yof cc SOB with PMH to include IDDM, CHF, O2 requirements at home. New onset AF in ED. Pharmacy consulted to dose UFH for AF. No PTA OAC listed.  2/5 @ 1140 HL < 0.10 subtherapeutic from goal 0.3 - 0.7   Goal of Therapy: Heparin level 0.3-0.7 units/ml Monitor platelets by anticoagulation protocol: Yes  Plan: Give 4000units bolus x 1 Start heparin infusion at 800units/hr Check anti-Xa level in 8hours and daily while on heparin  Continue to monitor H&H and platelets  2/5 Gave 2500 unit bolus x  1 Increasing infusion to 1000 units/hr Checking anti-xa in 8 hours (2/5 2200) Will continue to monitor heparin level, H/H, and plts.  0205 22:00 heparin level <0.1. 2100 unit bolus and increase rate to 1250 units/hr. Recheck in 8 hours.  2/6 0707 HL 0.27, will bolus w/ 1300 units (15 units/kg) then increase rate to 1400 units/hr and recheck anti-xa in 8 hours.  Thank you for this consult.       Tobie Lords, PharmD, BCPS      Clinical Pharmacist 06/19/2016

## 2016-06-19 NOTE — Clinical Social Work Note (Signed)
Clinical Social Work Assessment  Patient Details  Name: Andrea [name] MRN: [id number] Date of Birth: January 20, 1946  Date of referral:  06/19/16               Reason for consult:  Facility Placement                Permission sought to share information with:  Facility Sport and exercise psychologist, Family Supports Permission granted to share information::  Yes, Verbal Permission Granted  Name::     Karolina, Zamor Spouse [phone]   Agency::  SNF admissions  Relationship::     Contact Information:     Housing/Transportation Living arrangements for the past 2 months:  [place] of Information:  Patient Patient Interpreter Needed:  None Criminal Activity/Legal Involvement Pertinent to Current Situation/Hospitalization:  No - Comment as needed Significant Relationships:  Spouse Lives with:  Spouse Do you feel safe going back to the place where you live?  No Need for family participation in patient care:  No (Coment)  Care giving concerns:  Patient feels she needs some short term rehab before she is able to return back home.   Social Worker assessment / plan:  Patient is a 71 year old female who is alert and oriented x3 and lives with her husband.  Patient states she has not been to rehab before, CSW explained to patient what to expect at SNF and how insurance will pay for the stay.  Patient was explained the benefits of going to a SNF verse going home with home health.  Patient expressed that she would prefer to go home with home health instead.  CSW notified case Freight forwarder.  Employment status:  Retired Nurse, adult PT Recommendations:  [place] / Referral to community resources:  [place]  Patient/Family's Response to care:  Patient was in agreement to going to SNF, now she would rather go home with home health. Patient/Family's Understanding of and Emotional Response to Diagnosis, Current Treatment, and  Prognosis:  Patient expressed sadness because her eldest son passed away about three weeks ago.  Patient expressed that she feels like she need more time to mourn before she is able to completely participate in therapy at a SNF.  Emotional Assessment Appearance:  Appears stated age Attitude/Demeanor/Rapport:    Affect (typically observed):  Calm, Appropriate, Stable Orientation:  Oriented to Self, Oriented to Place, Oriented to  Time Alcohol / Substance use:  Not Applicable Psych involvement (Current and /or in the community):  No (Comment)  Discharge Needs  Concerns to be addressed:  Lack of Support Readmission within the last 30 days:  No Current discharge risk:  Lack of support system Barriers to Discharge:  Insurance Authorization   Anell Barr 06/19/2016, 1:51 PM

## 2016-06-19 NOTE — Evaluation (Signed)
Physical Therapy Evaluation Patient Details Name: Andrea [name] MRN: [id number] DOB: Jul 22, 1945 Today's Date: 06/19/2016   History of Present Illness  Pt is a 71 y.o. female presenting to hospital with SOB x4 weeks and chest pain with exertion.  Pt found to have mild pulmonary edema and new onset a-fib.  Pt admitted to hospital with acute diastolic CHF, new onset a-fib, and uncontrolled diabetes.  PMH includes 2-3 L home O2, IDDM, CHF, chronic back pain, htn, and PUD.  Clinical Impression  Prior to hospital admission, pt reports being independent with ambulation.  Pt lives with her husband in 1 level home with 5 steps to enter.  Currently pt is SBA to min assist with bed mobility, min assist with transfers, and min assist to ambulate 50 feet with RW (distance limited d/t weakness/fatigue/safety concerns and SOB).  Pt demonstrating increased UE support on RW but yet was consistently pushing RW too far forward and had difficulty maneuvering RW requiring assist for walker and also to steady pt.  During session pt's BP 163/53-166/62, HR 58-60 bpm, and O2 98% on 3 L at rest (O2 decreased to 88% post ambulation but returned to 94% with rest break in bed all on 3 L O2 via nasal cannula).  Pt would benefit from skilled PT to address noted impairments and functional limitations.  Recommend pt discharge to STR when medically appropriate (CM, SW, and nursing notified).    Follow Up Recommendations SNF    Equipment Recommendations  Rolling walker with 5" wheels    Recommendations for Other Services       Precautions / Restrictions Precautions Precautions: Fall Restrictions Weight Bearing Restrictions: No      Mobility  Bed Mobility Overal bed mobility: Needs Assistance Bed Mobility: Supine to Sit; Sit to supine     Supine to sit: Supervision  Sit to supine: Min assist for LE's   General bed mobility comments: bed flat, use of side rail, significant increased effort and time to perform on  own  Transfers Overall transfer level: Needs assistance Equipment used: None;Rolling walker (2 wheeled) Transfers: Sit to/from Stand Sit to Stand: Min assist         General transfer comment: Pt initially stood without UE support but pt pushing LE's against back of bed and then leaning to hold onto bed once standing; pt stood from bed and bedside chair with RW with vc's for hand positioning and safe use of walker (assist to initiate stand)  Ambulation/Gait Ambulation/Gait assistance: Min assist;+2 safety/equipment (2nd assist for O2 tank) Ambulation Distance (Feet):  (5 feet; 50 feet) Assistive device: Rolling walker (2 wheeled)   Gait velocity: decreased   General Gait Details: decreased B step length/foot clearance/heelstrike; pt requiring consistent vc's and assist to stay closer to RW and to maneuver RW; min assist to steady pt with ambulation; first ambulation trial limited to 5 feet d/t pt's difficulty advancing B LE's so pt assisted into bedside chair to rest and then re-attempted gait for 50 feet but limited distance d/t increasing fatigue and decreased safety with distance  Stairs            Wheelchair Mobility    Modified Rankin (Stroke Patients Only)       Balance Overall balance assessment: Needs assistance;History of Falls Sitting-balance support: Bilateral upper extremity supported;Feet supported Sitting balance-Leahy Scale: Fair Sitting balance - Comments: static sitting   Standing balance support: Single extremity supported Standing balance-Leahy Scale: Fair Standing balance comment: static standing (pt's B LE's pushing  against bed to maintain balance)                             Pertinent Vitals/Pain Pain Assessment: No/denies pain    Home Living Family/patient expects to be discharged to:: Private residence Living Arrangements: Spouse/significant other Available Help at Discharge: Family Type of Home: House Home Access: Stairs to  enter Entrance Stairs-Rails: Right;Left;Can reach both Entrance Stairs-Number of Steps: 5 Home Layout: One level Home Equipment: [place] - 4 wheels;Cane - single point      Prior Function Level of Independence: Independent         Comments: Pt reports ambulating without AD but has had multiple falls in past 6 months.  Pt independent with ADL's.  Does not drive.  Pt has home O2 2-3 L.  Of note, pt's son passed away 06-05-2016.     Hand Dominance        Extremity/Trunk Assessment   Upper Extremity Assessment Upper Extremity Assessment: Generalized weakness    Lower Extremity Assessment Lower Extremity Assessment: Generalized weakness       Communication   Communication: HOH  Cognition Arousal/Alertness:  (Pt initially soundly sleeping and requiring extra time to wake up (pt alert once sitting on edge of bed).) Behavior During Therapy: Flat affect Overall Cognitive Status: Within Functional Limits for tasks assessed                      General Comments General comments (skin integrity, edema, etc.): Pt noted to have been incontinent of urine in bed upon performing bed mobility and nursing tech came to assist with clean-up.  Pt agreeable to PT session and pleasant during session.    Exercises     Assessment/Plan    PT Assessment Patient needs continued PT services  PT Problem List Decreased strength;Decreased activity tolerance;Decreased balance;Decreased mobility;Decreased knowledge of use of DME;Decreased safety awareness;Decreased knowledge of precautions;Cardiopulmonary status limiting activity          PT Treatment Interventions DME instruction;Gait training;Stair training;Functional mobility training;Therapeutic activities;Therapeutic exercise;Balance training;Patient/family education    PT Goals (Current goals can be found in the Care Plan section)  Acute Rehab PT Goals Patient Stated Goal: to be able to walk independently again PT Goal Formulation:  With patient Time For Goal Achievement: 07/03/16 Potential to Achieve Goals: Fair    Frequency Min 2X/week   Barriers to discharge Decreased caregiver support      Co-evaluation               End of Session Equipment Utilized During Treatment: Gait belt;Oxygen (3 L/min O2 via nasal cannula) Activity Tolerance: Patient limited by fatigue Patient left: in bed;with call bell/phone within reach;with bed alarm set Nurse Communication: Mobility status;Precautions         Time: 9629-5284 PT Time Calculation (min) (ACUTE ONLY): 42 min   Charges:   PT Evaluation $PT Eval Low Complexity: 1 Procedure PT Treatments $Gait Training: 8-22 mins   PT G CodesLeitha Bleak 06/30/2016, 11:20 AM Leitha Bleak, [place]

## 2016-06-19 NOTE — Evaluation (Addendum)
Occupational Therapy Evaluation Patient Details Name: Andrea [name] MRN: [id number] DOB: 17-Musselman-1947 Today's Date: 06/19/2016    History of Present Illness Pt is a 71 y.o. female presenting to hospital with SOB x4 weeks and chest pain with exertion.  Pt found to have mild pulmonary edema and new onset a-fib.  Pt admitted to hospital with acute diastolic CHF, new onset a-fib, and uncontrolled diabetes.  PMH includes 2-3 L home O2, IDDM, CHF, chronic back pain, htn, and PUD.   Clinical Impression   Pt seen for OT evaluation this date. Pt presents with decreased activity tolerance/strength/knowledge of AE and compensatory strategies for ADL to minimize SOB and falls risk. Pt/spouse educated in AE for LB dressing tasks while seated EOB to minimize need to bend over and decrease energy expenditure and SOB. Pt able to return demonstrate techniques and reported this would be very helpful. Spouse manages pt's medications at home. Spouse/pt educated in compensatory strategies for medications mgt. Pt would benefit from skilled OT services to address noted impairments and functional deficits including additional training/education in AE/DME for ADL tasks, home modifications to increase safety and functional independence, and additional compensatory strategies for chronic pain mgt  To maximize return to PLOF as well as return to activities she enjoyed but has not had the energy to do (crafting, crochet, painting, etc) in order to minimize future falls risk, risk of rehospitalization, and maximize quality of life. Per nsg, pt is planning for d/c home today with [facility] services, including [place]. Will sign off, as all further OT needs can be done at next venue of care.    Follow Up Recommendations  Home health OT    Equipment Recommendations  Tub/shower seat;Other (comment);Toilet rise with handles (grab bars in bathroom, shower chair with bilateral arm rails, reacher, sock aid, long handled shoe horn, long handled  sponge, consider wedge pillow)    Recommendations for Other Services       Precautions / Restrictions Precautions Precautions: Fall Restrictions Weight Bearing Restrictions: No      Mobility Bed Mobility Overal bed mobility: Needs Assistance Bed Mobility: Supine to Sit;Sit to Supine     Supine to sit: Min assist Sit to supine: Supervision   General bed mobility comments: pt performed with bed flat, using side rails, increased effort and time needed to perform with min assist to complete sup>sit EOB due to fatigue  Transfers                      Balance Overall balance assessment: Needs assistance;History of Falls Sitting-balance support: Single extremity supported;Feet supported Sitting balance-Leahy Scale: Fair                                      ADL Overall ADL's : Needs assistance/impaired     Grooming: Set up;Sitting;Supervision/safety               Lower Body Dressing: With adaptive equipment;Sitting/lateral leans;Min guard;Set up Lower Body Dressing Details (indicate cue type and reason): Pt educated in [place] for LB dressing to minimize SOB, able to return demonstrate safe technique w/ supervision while seated               General ADL Comments: Pt limited by SOB/poor activity tolerance/gen weakness for ADL     Vision Vision Assessment?: No apparent visual deficits   Perception     Praxis Praxis Praxis tested?:  Within functional limits    Pertinent Vitals/Pain Pain Assessment: No/denies pain (pt does report having chronic back pain with movement, although did not report any pain during OT session)     Hand Dominance     Extremity/Trunk Assessment Upper Extremity Assessment Upper Extremity Assessment: Generalized weakness   Lower Extremity Assessment Lower Extremity Assessment: Generalized weakness       Communication Communication Communication: No difficulties   Cognition Arousal/Alertness:  Awake/alert Behavior During Therapy: WFL for tasks assessed/performed Overall Cognitive Status: Within Functional Limits for tasks assessed                     General Comments       Exercises   Other Exercises Other Exercises: Pt educated in AE/DME and home modifications as well as pursed lip breathing and brief overview of energy conservation strategies to support safety and falls prevention at home   Shoulder Instructions      Home Living Family/patient expects to be discharged to:: Private residence Living Arrangements: Spouse/significant other Available Help at Discharge: Family Type of Home: House Home Access: Stairs to enter Technical brewer of Steps: 5 Entrance Stairs-Rails: Right;Left;Can reach both Home Layout: One level     Bathroom Shower/Tub: Tub/shower unit;Walk-in shower;Door Shower/tub characteristics: Industrial/product designer: No   Home Equipment: Building services engineer Comments: Pt has shower seat that she uses but has difficulty utilizing safety because it does not have arm rails      Prior Functioning/Environment Level of Independence: Independent with assistive device(s)        Comments: Pt reports ambulating without AD, multiple falls in past 6 months; modif indep with bathing (shower chair) but reports difficulty with completing bathing/dressing due to SOB. Pt has home O2 2-3L. Of note, pt's son passed away 2016/06/17        OT Problem List: Cardiopulmonary status limiting activity;Decreased strength;Decreased activity tolerance;Decreased knowledge of use of DME or AE   OT Treatment/Interventions:      OT Goals(Current goals can be found in the care plan section)    OT Frequency:     Barriers to D/C:            Co-evaluation              End of Session    Activity Tolerance: Patient tolerated treatment well Patient left: in bed;with call bell/phone within reach;with bed alarm  set;with family/visitor present   Time: 1442-1531 OT Time Calculation (min): 49 min Charges:  OT General Charges $OT Visit: 1 Procedure OT Evaluation $OT Eval Low Complexity: 1 Procedure OT Treatments $Self Care/Home Management : 23-37 mins G-Codes:    Corky Sox, OTR/L 06/19/2016, 3:45 PM

## 2016-06-19 NOTE — Progress Notes (Signed)
Pt discharged to home via wc.  Instructions  given to pt.  Questions answered.  No distress.  

## 2016-06-19 NOTE — Progress Notes (Signed)
*  PRELIMINARY RESULTS* Echocardiogram 2D Echocardiogram has been performed.  Andrea [name] 06/19/2016, 2:40 PM

## 2016-06-19 NOTE — Progress Notes (Signed)
Zofran 4mg iv given for complaints of nausea, will monitor. 

## 2016-06-19 NOTE — NC FL2 (Signed)
[place] LEVEL OF CARE SCREENING TOOL     IDENTIFICATION  Patient Name: Andrea [name] Birthdate: 04-19-1946 Sex: female Admission Date (Current Location): 06/17/2016  [place] and [place] Number:  Engineering geologist and Address:  [facility], [address], [place], [place] [address]      Provider Number: [id number]  Attending Physician Name and Address:  Fritzi Mandes, MD  Relative Name and Phone Number:  [place]ol[place], Goodie Spouse [phone]     Current Level of Care: Hospital Recommended Level of Care: [place] Prior Approval Number:    Date Approved/Denied:   PASRR Number: [id number]  Discharge Plan: SNF    Current Diagnoses: Patient Active Problem List   Diagnosis Date Noted  . CHF (congestive heart failure) ([place]) 06/18/2016  . GERD (gastroesophageal reflux disease) 03/07/2013  . Other and unspecified hyperlipidemia 03/07/2013  . Dizziness and giddiness 11/21/2012  . Urinary incontinence 09/11/2012  . Chronic low back pain 07/30/2012  . Anemia 07/30/2012  . Noncompliance 07/30/2012  . Neuropathy ([place]) 01/12/2012  . Carotid stenosis, right 11/29/2011  . Diabetes mellitus type 2, uncontrolled ([place]) 11/29/2011  . Hypertension 11/29/2011  . Renal artery stenosis ([place]) 06/04/2011  . Constipation, chronic 06/04/2011  . Hypothyroidism 04/11/2011    Orientation RESPIRATION BLADDER Height & Weight     Self, Time, Situation, Place  O2 (2L) Incontinent Weight: 197 lb 9.6 oz (89.6 kg) Height:  5\' 1"  (154.9 cm)  BEHAVIORAL SYMPTOMS/MOOD NEUROLOGICAL BOWEL NUTRITION STATUS      Continent Diet (Carb Modified)  AMBULATORY STATUS COMMUNICATION OF NEEDS Skin   Limited Assist Verbally Normal                       Personal Care Assistance Level of Assistance  Bathing, Feeding, Dressing Bathing Assistance: Limited assistance Feeding assistance: Independent Dressing Assistance: Limited assistance     Functional  Limitations Info  Sight, Speech, Hearing Sight Info: Adequate Hearing Info: Adequate Speech Info: Adequate    SPECIAL CARE FACTORS FREQUENCY  PT (By licensed PT)     PT Frequency: 5x a week              Contractures Contractures Info: Not present    Additional Factors Info  Code Status, Insulin Sliding Scale, Allergies Code Status Info: Full Code Allergies Info: WATERMELON CONCENTRATE, ACE INHIBITORS, CELEBREX CELECOXIB, PENICILLINS, SULFA ANTIBIOTICS    Insulin Sliding Scale Info: insulin aspart (novoLOG) injection 0-15 Units       Current Medications (06/19/2016):  This is the current hospital active medication list Current Facility-Administered Medications  Medication Dose Route Frequency Provider Last Rate Last Dose  . 0.9 %  sodium chloride infusion  250 mL Intravenous PRN Saundra Shelling, MD      . acetaminophen (TYLENOL) tablet 650 mg  650 mg Oral Q4H PRN Pavan Pyreddy, MD      . albuterol (PROVENTIL) (2.5 MG/3ML) 0.083% nebulizer solution 2.5 mg  2.5 mg Nebulization Q4H PRN Nicholes Mango, MD   2.5 mg at 06/18/16 1814  . alum & mag hydroxide-simeth (MAALOX/MYLANTA) 200-200-20 MG/5ML suspension 15 mL  15 mL Oral Q4H PRN Fritzi Mandes, MD   15 mL at 06/18/16 1248  . amLODipine (NORVASC) tablet 10 mg  10 mg Oral Daily Saundra Shelling, MD   10 mg at 06/19/16 0903  . aspirin EC tablet 81 mg  81 mg Oral Daily Saundra Shelling, MD   81 mg at 06/19/16 0902  . atorvastatin (LIPITOR) tablet  20 mg  20 mg Oral Daily Saundra Shelling, MD   20 mg at 06/19/16 0903  . carvedilol (COREG) tablet 6.25 mg  6.25 mg Oral BID WC Saundra Shelling, MD   6.25 mg at 06/19/16 0751  . cholecalciferol (VITAMIN D) tablet 1,000 Units  1,000 Units Oral Daily Saundra Shelling, MD   1,000 Units at 06/19/16 0902  . clopidogrel (PLAVIX) tablet 75 mg  75 mg Oral Daily Saundra Shelling, MD   75 mg at 06/19/16 0902  . docusate sodium (COLACE) capsule 100 mg  100 mg Oral BID Saundra Shelling, MD   100 mg at 06/19/16 0903  . furosemide  (LASIX) tablet 40 mg  40 mg Oral BID Fritzi Mandes, MD   40 mg at 06/19/16 0908  . insulin aspart (novoLOG) injection 0-15 Units  0-15 Units Subcutaneous TID WC Saundra Shelling, MD   8 Units at 06/18/16 1730  . insulin aspart (novoLOG) injection 0-5 Units  0-5 Units Subcutaneous QHS Pavan Pyreddy, MD      . levothyroxine (SYNTHROID, LEVOTHROID) tablet 175 mcg  175 mcg Oral QAC breakfast Saundra Shelling, MD   175 mcg at 06/19/16 0751  . multivitamin with minerals tablet 1 tablet  1 tablet Oral Daily Saundra Shelling, MD   1 tablet at 06/19/16 0902  . ondansetron (ZOFRAN) injection 4 mg  4 mg Intravenous Q6H PRN Saundra Shelling, MD   4 mg at 06/18/16 1420  . PARoxetine (PAXIL) tablet 20 mg  20 mg Oral Daily Saundra Shelling, MD   20 mg at 06/19/16 0903  . sodium chloride flush (NS) 0.9 % injection 3 mL  3 mL Intravenous Q12H Saundra Shelling, MD   3 mL at 06/19/16 0906  . sodium chloride flush (NS) 0.9 % injection 3 mL  3 mL Intravenous PRN Pavan Pyreddy, MD      . vitamin B-12 (CYANOCOBALAMIN) tablet 1,000 mcg  1,000 mcg Oral Daily Saundra Shelling, MD   1,000 mcg at 06/19/16 0902     Discharge Medications: Please see discharge summary for a list of discharge medications.  Relevant Imaging Results:  Relevant Lab Results:   Additional Information SSN [id number]  Ross Ludwig, [place]

## 2016-06-19 NOTE — Discharge Summary (Signed)
[facility] [place] NAME: Andrea [name]    MR#:  [id number]  DATE OF BIRTH:  May 22, 1945  DATE OF ADMISSION:  06/17/2016 ADMITTING PHYSICIAN: Saundra Shelling, MD  DATE OF DISCHARGE: 06/19/16  PRIMARY CARE PHYSICIAN: No PCP Per Patient    ADMISSION DIAGNOSIS:  Dyspnea on exertion [R06.09] Acute renal insufficiency [N28.9] New onset atrial fibrillation ([place]) [I48.91] Type 2 diabetes mellitus with hyperglycemia, with long-term current use of insulin (HCC) [E11.65, Z79.4] Chest pain, unspecified type [R07.9]  DISCHARGE DIAGNOSIS:  Acute on chronic CHF,diastolic Chronic respiratory failure on chronic home oxygen New onset Afib-resolved now in SR/S bradycardia Dm-2 SECONDARY DIAGNOSIS:   Past Medical History:  Diagnosis Date  . CHF (congestive heart failure) ([place])   . Chronic back pain    Followed by pain clinic in [place]  . Endometriosis   . HTN (hypertension)   . Hypothyroidism   . IDDM (insulin dependent diabetes mellitus) ([place])   . NASH (nonalcoholic steatohepatitis)    has had liver biopsy in past  . Obesity   . PUD (peptic ulcer disease) 84's    HOSPITAL COURSE:   71yo female with a history of congestive heart failure, hypertension, hypothyroidism, diabetes, and hyperlipidemia who was admitted with progressive shortness of breath. She is on 3L O2 by nasal canula as needed at home. Was noted to have new onset atrial fibrillation in the ED.   1) Acute diastolic congestive heart failure (from echo done at [facility] about 1.5 years ago showing preserved LV function) - Continue gentle diuresis with Lasix 60mg  every 12 hours ---now on po home dose lasix 80 mg daily -UOP about 1800cc since admission -wears chronic oxygen at home - Echo of 206 done at [facility] reviewed. EF 55-60%, mild AS - Appreciate input from cardiologyDr. F ATH. Ok to d/c heparin gtt  2) New onset atrial fibrillation  - Rate well controlled. Continue  Carvedilol - IV Heparin ---now d/ced since in S brady/sr. Cont asa/plavix  3) Uncontrolled diabetes - Continue Novolog sliding scale coverage -decreased Toujeo to 60 units daily since sugars well controlled   4) Indigestion  - Started on Maalox 68mL every 4 hours as needed  Overall improving PT to see pt HHRN for CHF  CONSULTS OBTAINED:  Treatment Team:  Teodoro Spray, MD  DRUG ALLERGIES:   Allergies  Allergen Reactions  . Watermelon Concentrate Anaphylaxis  . Ace Inhibitors   . Celebrex [Celecoxib] Rash  . Penicillins Rash  . Sulfa Antibiotics Rash    DISCHARGE MEDICATIONS:   Current Discharge Medication List    CONTINUE these medications which have CHANGED   Details  Insulin Glargine (TOUJEO SOLOSTAR) 300 UNIT/ML SOPN Inject 60 Units into the skin daily. Qty: 1 pen, Refills: 0      CONTINUE these medications which have NOT CHANGED   Details  acetaminophen (TYLENOL) 325 MG tablet Take 650 mg by mouth 3 (three) times daily as needed.      amLODipine (NORVASC) 10 MG tablet TAKE ONE TABLET BY MOUTH ONCE DAILY *NEEDS APPOINTMENT FOR FURTHER REFILLS* Qty: 30 tablet, Refills: 0    aspirin 81 MG tablet Take 81 mg by mouth daily.    atorvastatin (LIPITOR) 20 MG tablet TAKE ONE TABLET BY MOUTH ONCE DAILY Qty: 90 tablet, Refills: 0    carvedilol (COREG) 6.25 MG tablet Take 6.25 mg by mouth 2 (two) times daily with a meal.    Cholecalciferol (VITAMIN D HIGH POTENCY PO) Take 1 capsule  by mouth daily.      clopidogrel (PLAVIX) 75 MG tablet Take 1 tablet (75 mg total) by mouth daily. Qty: 30 tablet, Refills: 6    docusate sodium (COLACE) 100 MG capsule Take 100 mg by mouth 2 (two) times daily.    furosemide (LASIX) 40 MG tablet Take 80 mg by mouth daily.    gabapentin (NEURONTIN) 800 MG tablet Take 1 tablet (800 mg total) by mouth 2 (two) times daily. Qty: 180 tablet, Refills: 3   Associated Diagnoses: Neuropathy (HCC)    insulin aspart (NOVOLOG) 100 UNIT/ML  injection Inject 3-15 Units into the skin 3 (three) times daily before meals. Sliding scale Qty: 1 vial, Refills: 3   Associated Diagnoses: Diabetes mellitus type 2, uncontrolled (HCC)    levothyroxine (SYNTHROID, LEVOTHROID) 150 MCG tablet TAKE ONE TABLET BY MOUTH EVERY DAY Qty: 90 tablet, Refills: 1    lidocaine (LIDODERM) 5 % Place 1 patch onto the skin daily. Remove & Discard patch within 12 hours or as directed by MD Qty: 30 patch, Refills: 0   Associated Diagnoses: Chronic low back pain    losartan (COZAAR) 50 MG tablet Take 1 tablet (50 mg total) by mouth daily. Qty: 30 tablet, Refills: 6   Associated Diagnoses: Hypertension    Multiple Vitamin (MULTIVITAMIN) tablet Take 1 tablet by mouth daily.    PARoxetine (PAXIL) 20 MG tablet TAKE ONE TABLET BY MOUTH IN THE MORNING Qty: 30 tablet, Refills: 4    polyethylene glycol powder (GLYCOLAX/MIRALAX) powder Take 17 g by mouth 3 (three) times daily as needed. For constipation     vitamin B-12 (CYANOCOBALAMIN) 1000 MCG tablet Take 1,000 mcg by mouth daily.        If you experience worsening of your admission symptoms, develop shortness of breath, life threatening emergency, suicidal or homicidal thoughts you must seek medical attention immediately by calling 911 or calling your MD immediately  if symptoms less severe.  You Must read complete instructions/literature along with all the possible adverse reactions/side effects for all the Medicines you take and that have been prescribed to you. Take any new Medicines after you have completely understood and accept all the possible adverse reactions/side effects.   Please note  You were cared for by a hospitalist during your hospital stay. If you have any questions about your discharge medications or the care you received while you were in the hospital after you are discharged, you can call the unit and asked to speak with the hospitalist on call if the hospitalist that took care of you is  not available. Once you are discharged, your primary care physician will handle any further medical issues. Please note that NO REFILLS for any discharge medications will be authorized once you are discharged, as it is imperative that you return to your primary care physician (or establish a relationship with a primary care physician if you do not have one) for your aftercare needs so that they can reassess your need for medications and monitor your lab values. Today   SUBJECTIVE   Weak. Overall improving  VITAL SIGNS:  Blood pressure (!) 185/51, pulse (!) 58, temperature 98.4 F (36.9 C), temperature source Oral, resp. rate 15, height 5\' 1"  (1.549 m), weight 89.6 kg (197 lb 9.6 oz), SpO2 99 %.  I/O:   Intake/Output Summary (Last 24 hours) at 06/19/16 0920 Last data filed at 06/19/16 0904  Gross per 24 hour  Intake            751.2  ml  Output             1500 ml  Net           -748.8 ml    PHYSICAL EXAMINATION:  GENERAL:  71 y.o.-year-old patient lying in the bed with no acute distress. obese EYES: Pupils equal, round, reactive to light and accommodation. No scleral icterus. Extraocular muscles intact.  HEENT: Head atraumatic, normocephalic. Oropharynx and nasopharynx clear.  NECK:  Supple, no jugular venous distention. No thyroid enlargement, no tenderness.  LUNGS: decreased breath sounds bilaterally, no wheezing, rales,rhonchi or crepitation. No use of accessory muscles of respiration.  CARDIOVASCULAR: S1, S2 normal. No murmurs, rubs, or gallops.  ABDOMEN: Soft, non-tender, non-distended. Bowel sounds present. No organomegaly or mass.  EXTREMITIES + pedal edema, no cyanosis, or clubbing.  NEUROLOGIC: Cranial nerves II through XII are intact. Muscle strength 5/5 in all extremities. Sensation intact. Gait not checked.  PSYCHIATRIC: The patient is alert and oriented x 3.  SKIN: No obvious rash, lesion, or ulcer.   DATA REVIEW:   CBC   Recent Labs Lab 06/19/16 0431  WBC 7.6   HGB 9.0*  HCT 26.8*  PLT 158    Chemistries   Recent Labs Lab 06/17/16 2258 06/19/16 0431  NA 137 140  K 4.0 4.5  CL 101 104  CO2 27 28  GLUCOSE 338* 133*  BUN 46* 46*  CREATININE 1.95* 1.78*  CALCIUM 8.2* 8.6*  AST 20  --   ALT 10*  --   ALKPHOS 109  --   BILITOT 0.7  --     Microbiology Results   No results found for this or any previous visit (from the past 240 hour(s)).  RADIOLOGY:  Dg Chest 2 View  Result Date: 06/18/2016 CLINICAL DATA:  Shortness of breath EXAM: CHEST  2 VIEW COMPARISON:  03/08/2014 FINDINGS: Tiny pleural effusions on the lateral view. No acute consolidation. There is cardiomegaly and mild central congestion. Atherosclerosis. No pneumothorax. IMPRESSION: 1. Tiny pleural effusions 2. Cardiomegaly with mild central vascular congestion Electronically Signed   By: Donavan Foil M.D.   On: 06/18/2016 00:33     Management plans discussed with the patient, family and they are in agreement.  CODE STATUS:     Code Status Orders        Start     Ordered   06/18/16 0330  Full code  Continuous     06/18/16 0329    Code Status History    Date Active Date Inactive Code Status Order ID Comments User Context   This patient has a current code status but no historical code status.      TOTAL TIME TAKING CARE OF THIS PATIENT: 40 minutes.    Adren Dollins M.D on 06/19/2016 at 9:20 AM  Between 7am to 6pm - Pager - [phone] After 6pm go to [email or web address] - password [email or web address] [facility]  Office  [phone]  CC: Primary care physician; No PCP Per Patient

## 2016-06-19 NOTE — Care Management (Signed)
Patient has changed her mind and now wants to go home with home health.  Referral called to and accepted by [facility]. In network with patient's medicare uhc.   Called referral for SN PT OT Aide and SW.  Patient verbalizes understanding to have portable 02 tank brought for transport home

## 2016-06-19 NOTE — Clinical Social Work Note (Addendum)
CSW met with patient who is in agreement to going to SNF for short term rehab.  CSW was given permission to begin bed search process.  Patient has [employer], CSW has faxed clinical information to insurance company awaiting insurance approval.  2:00pm  CSW met with patient and her husband again and they have decided they would rather go home with home health.  CSW explained the advantages of going to SNF verse going home with home health, patient's husband said he is available to help take care of her.  CSW explained that a Education officer, museum can come to the home as well to assist with grief counseling services and or placement from the home.  Patient and her husband stated they are interested in having a social worker come in as well as other home health services.  Patient expressed that she just lost her eldest son about 3 weeks ago, and she does not feel like she is ready for SNF placement for therapy and would rather go home.  CSW updated case Freight forwarder.  Jones Broom. [place], MSW, [place]  06/19/2016 1:48 PM

## 2016-06-19 NOTE — Care Management Important Message (Signed)
Important Message  Patient Details  Name: Andrea [name] MRN: [id number] Date of Birth: April 01, 1946   Medicare Important Message Given:  Yes  Initial signed IM printed from Epic and given to patient.   Katrina Stack, RN 06/19/2016, 2:25 PM

## 2016-06-24 DIAGNOSIS — I4891 Unspecified atrial fibrillation: Secondary | ICD-10-CM | POA: Diagnosis not present

## 2016-06-24 DIAGNOSIS — N189 Chronic kidney disease, unspecified: Secondary | ICD-10-CM | POA: Diagnosis not present

## 2016-06-24 DIAGNOSIS — E1140 Type 2 diabetes mellitus with diabetic neuropathy, unspecified: Secondary | ICD-10-CM | POA: Diagnosis not present

## 2016-06-24 DIAGNOSIS — F329 Major depressive disorder, single episode, unspecified: Secondary | ICD-10-CM | POA: Diagnosis not present

## 2016-06-24 DIAGNOSIS — E1122 Type 2 diabetes mellitus with diabetic chronic kidney disease: Secondary | ICD-10-CM | POA: Diagnosis not present

## 2016-06-24 DIAGNOSIS — I130 Hypertensive heart and chronic kidney disease with heart failure and stage 1 through stage 4 chronic kidney disease, or unspecified chronic kidney disease: Secondary | ICD-10-CM | POA: Diagnosis not present

## 2016-06-24 DIAGNOSIS — J449 Chronic obstructive pulmonary disease, unspecified: Secondary | ICD-10-CM | POA: Diagnosis not present

## 2016-06-24 DIAGNOSIS — D649 Anemia, unspecified: Secondary | ICD-10-CM | POA: Diagnosis not present

## 2016-06-24 DIAGNOSIS — I5032 Chronic diastolic (congestive) heart failure: Secondary | ICD-10-CM | POA: Diagnosis not present

## 2016-06-26 DIAGNOSIS — I4891 Unspecified atrial fibrillation: Secondary | ICD-10-CM | POA: Diagnosis not present

## 2016-06-26 DIAGNOSIS — E1122 Type 2 diabetes mellitus with diabetic chronic kidney disease: Secondary | ICD-10-CM | POA: Diagnosis not present

## 2016-06-26 DIAGNOSIS — J449 Chronic obstructive pulmonary disease, unspecified: Secondary | ICD-10-CM | POA: Diagnosis not present

## 2016-06-26 DIAGNOSIS — N189 Chronic kidney disease, unspecified: Secondary | ICD-10-CM | POA: Diagnosis not present

## 2016-06-26 DIAGNOSIS — I5032 Chronic diastolic (congestive) heart failure: Secondary | ICD-10-CM | POA: Diagnosis not present

## 2016-06-26 DIAGNOSIS — E1140 Type 2 diabetes mellitus with diabetic neuropathy, unspecified: Secondary | ICD-10-CM | POA: Diagnosis not present

## 2016-06-26 DIAGNOSIS — F329 Major depressive disorder, single episode, unspecified: Secondary | ICD-10-CM | POA: Diagnosis not present

## 2016-06-26 DIAGNOSIS — D649 Anemia, unspecified: Secondary | ICD-10-CM | POA: Diagnosis not present

## 2016-06-26 DIAGNOSIS — I130 Hypertensive heart and chronic kidney disease with heart failure and stage 1 through stage 4 chronic kidney disease, or unspecified chronic kidney disease: Secondary | ICD-10-CM | POA: Diagnosis not present

## 2016-06-27 ENCOUNTER — Telehealth: Payer: Self-pay | Admitting: Family

## 2016-06-27 ENCOUNTER — Ambulatory Visit: Payer: Self-pay | Admitting: Family

## 2016-06-27 DIAGNOSIS — I480 Paroxysmal atrial fibrillation: Secondary | ICD-10-CM | POA: Diagnosis not present

## 2016-06-27 DIAGNOSIS — I10 Essential (primary) hypertension: Secondary | ICD-10-CM | POA: Diagnosis not present

## 2016-06-27 DIAGNOSIS — I5032 Chronic diastolic (congestive) heart failure: Secondary | ICD-10-CM | POA: Diagnosis not present

## 2016-06-27 DIAGNOSIS — Z794 Long term (current) use of insulin: Secondary | ICD-10-CM | POA: Diagnosis not present

## 2016-06-27 DIAGNOSIS — E1140 Type 2 diabetes mellitus with diabetic neuropathy, unspecified: Secondary | ICD-10-CM | POA: Diagnosis not present

## 2016-06-27 NOTE — Telephone Encounter (Signed)
Patient missed her initial appointment at the [facility] on 06/27/16. Will attempt to reschedule.

## 2016-07-03 DIAGNOSIS — D649 Anemia, unspecified: Secondary | ICD-10-CM | POA: Diagnosis not present

## 2016-07-03 DIAGNOSIS — N189 Chronic kidney disease, unspecified: Secondary | ICD-10-CM | POA: Diagnosis not present

## 2016-07-03 DIAGNOSIS — I5032 Chronic diastolic (congestive) heart failure: Secondary | ICD-10-CM | POA: Diagnosis not present

## 2016-07-03 DIAGNOSIS — E1122 Type 2 diabetes mellitus with diabetic chronic kidney disease: Secondary | ICD-10-CM | POA: Diagnosis not present

## 2016-07-03 DIAGNOSIS — J449 Chronic obstructive pulmonary disease, unspecified: Secondary | ICD-10-CM | POA: Diagnosis not present

## 2016-07-03 DIAGNOSIS — E1140 Type 2 diabetes mellitus with diabetic neuropathy, unspecified: Secondary | ICD-10-CM | POA: Diagnosis not present

## 2016-07-03 DIAGNOSIS — I4891 Unspecified atrial fibrillation: Secondary | ICD-10-CM | POA: Diagnosis not present

## 2016-07-03 DIAGNOSIS — F329 Major depressive disorder, single episode, unspecified: Secondary | ICD-10-CM | POA: Diagnosis not present

## 2016-07-03 DIAGNOSIS — I130 Hypertensive heart and chronic kidney disease with heart failure and stage 1 through stage 4 chronic kidney disease, or unspecified chronic kidney disease: Secondary | ICD-10-CM | POA: Diagnosis not present

## 2016-07-04 DIAGNOSIS — N189 Chronic kidney disease, unspecified: Secondary | ICD-10-CM | POA: Diagnosis not present

## 2016-07-04 DIAGNOSIS — Z794 Long term (current) use of insulin: Secondary | ICD-10-CM | POA: Diagnosis not present

## 2016-07-04 DIAGNOSIS — I10 Essential (primary) hypertension: Secondary | ICD-10-CM | POA: Diagnosis not present

## 2016-07-04 DIAGNOSIS — I4891 Unspecified atrial fibrillation: Secondary | ICD-10-CM | POA: Diagnosis not present

## 2016-07-04 DIAGNOSIS — E1122 Type 2 diabetes mellitus with diabetic chronic kidney disease: Secondary | ICD-10-CM | POA: Diagnosis not present

## 2016-07-04 DIAGNOSIS — I130 Hypertensive heart and chronic kidney disease with heart failure and stage 1 through stage 4 chronic kidney disease, or unspecified chronic kidney disease: Secondary | ICD-10-CM | POA: Diagnosis not present

## 2016-07-04 DIAGNOSIS — J449 Chronic obstructive pulmonary disease, unspecified: Secondary | ICD-10-CM | POA: Diagnosis not present

## 2016-07-04 DIAGNOSIS — F329 Major depressive disorder, single episode, unspecified: Secondary | ICD-10-CM | POA: Diagnosis not present

## 2016-07-04 DIAGNOSIS — E1140 Type 2 diabetes mellitus with diabetic neuropathy, unspecified: Secondary | ICD-10-CM | POA: Diagnosis not present

## 2016-07-04 DIAGNOSIS — D649 Anemia, unspecified: Secondary | ICD-10-CM | POA: Diagnosis not present

## 2016-07-04 DIAGNOSIS — I5032 Chronic diastolic (congestive) heart failure: Secondary | ICD-10-CM | POA: Diagnosis not present

## 2016-07-04 DIAGNOSIS — Z23 Encounter for immunization: Secondary | ICD-10-CM | POA: Diagnosis not present

## 2016-07-05 DIAGNOSIS — J449 Chronic obstructive pulmonary disease, unspecified: Secondary | ICD-10-CM | POA: Diagnosis not present

## 2016-07-05 DIAGNOSIS — N189 Chronic kidney disease, unspecified: Secondary | ICD-10-CM | POA: Diagnosis not present

## 2016-07-05 DIAGNOSIS — E1122 Type 2 diabetes mellitus with diabetic chronic kidney disease: Secondary | ICD-10-CM | POA: Diagnosis not present

## 2016-07-05 DIAGNOSIS — I130 Hypertensive heart and chronic kidney disease with heart failure and stage 1 through stage 4 chronic kidney disease, or unspecified chronic kidney disease: Secondary | ICD-10-CM | POA: Diagnosis not present

## 2016-07-05 DIAGNOSIS — F329 Major depressive disorder, single episode, unspecified: Secondary | ICD-10-CM | POA: Diagnosis not present

## 2016-07-05 DIAGNOSIS — I4891 Unspecified atrial fibrillation: Secondary | ICD-10-CM | POA: Diagnosis not present

## 2016-07-05 DIAGNOSIS — I5032 Chronic diastolic (congestive) heart failure: Secondary | ICD-10-CM | POA: Diagnosis not present

## 2016-07-05 DIAGNOSIS — E1140 Type 2 diabetes mellitus with diabetic neuropathy, unspecified: Secondary | ICD-10-CM | POA: Diagnosis not present

## 2016-07-05 DIAGNOSIS — D649 Anemia, unspecified: Secondary | ICD-10-CM | POA: Diagnosis not present

## 2016-07-06 DIAGNOSIS — I4891 Unspecified atrial fibrillation: Secondary | ICD-10-CM | POA: Diagnosis not present

## 2016-07-06 DIAGNOSIS — D649 Anemia, unspecified: Secondary | ICD-10-CM | POA: Diagnosis not present

## 2016-07-06 DIAGNOSIS — J449 Chronic obstructive pulmonary disease, unspecified: Secondary | ICD-10-CM | POA: Diagnosis not present

## 2016-07-06 DIAGNOSIS — I130 Hypertensive heart and chronic kidney disease with heart failure and stage 1 through stage 4 chronic kidney disease, or unspecified chronic kidney disease: Secondary | ICD-10-CM | POA: Diagnosis not present

## 2016-07-06 DIAGNOSIS — F329 Major depressive disorder, single episode, unspecified: Secondary | ICD-10-CM | POA: Diagnosis not present

## 2016-07-06 DIAGNOSIS — E1122 Type 2 diabetes mellitus with diabetic chronic kidney disease: Secondary | ICD-10-CM | POA: Diagnosis not present

## 2016-07-06 DIAGNOSIS — E1140 Type 2 diabetes mellitus with diabetic neuropathy, unspecified: Secondary | ICD-10-CM | POA: Diagnosis not present

## 2016-07-06 DIAGNOSIS — I5032 Chronic diastolic (congestive) heart failure: Secondary | ICD-10-CM | POA: Diagnosis not present

## 2016-07-06 DIAGNOSIS — N189 Chronic kidney disease, unspecified: Secondary | ICD-10-CM | POA: Diagnosis not present

## 2016-07-09 DIAGNOSIS — D649 Anemia, unspecified: Secondary | ICD-10-CM | POA: Diagnosis not present

## 2016-07-09 DIAGNOSIS — N189 Chronic kidney disease, unspecified: Secondary | ICD-10-CM | POA: Diagnosis not present

## 2016-07-09 DIAGNOSIS — E1140 Type 2 diabetes mellitus with diabetic neuropathy, unspecified: Secondary | ICD-10-CM | POA: Diagnosis not present

## 2016-07-09 DIAGNOSIS — F329 Major depressive disorder, single episode, unspecified: Secondary | ICD-10-CM | POA: Diagnosis not present

## 2016-07-09 DIAGNOSIS — J449 Chronic obstructive pulmonary disease, unspecified: Secondary | ICD-10-CM | POA: Diagnosis not present

## 2016-07-09 DIAGNOSIS — I4891 Unspecified atrial fibrillation: Secondary | ICD-10-CM | POA: Diagnosis not present

## 2016-07-09 DIAGNOSIS — E1122 Type 2 diabetes mellitus with diabetic chronic kidney disease: Secondary | ICD-10-CM | POA: Diagnosis not present

## 2016-07-09 DIAGNOSIS — I130 Hypertensive heart and chronic kidney disease with heart failure and stage 1 through stage 4 chronic kidney disease, or unspecified chronic kidney disease: Secondary | ICD-10-CM | POA: Diagnosis not present

## 2016-07-09 DIAGNOSIS — I5032 Chronic diastolic (congestive) heart failure: Secondary | ICD-10-CM | POA: Diagnosis not present

## 2016-07-11 DIAGNOSIS — I4891 Unspecified atrial fibrillation: Secondary | ICD-10-CM | POA: Diagnosis not present

## 2016-07-11 DIAGNOSIS — J449 Chronic obstructive pulmonary disease, unspecified: Secondary | ICD-10-CM | POA: Diagnosis not present

## 2016-07-11 DIAGNOSIS — D649 Anemia, unspecified: Secondary | ICD-10-CM | POA: Diagnosis not present

## 2016-07-11 DIAGNOSIS — E1122 Type 2 diabetes mellitus with diabetic chronic kidney disease: Secondary | ICD-10-CM | POA: Diagnosis not present

## 2016-07-11 DIAGNOSIS — E1140 Type 2 diabetes mellitus with diabetic neuropathy, unspecified: Secondary | ICD-10-CM | POA: Diagnosis not present

## 2016-07-11 DIAGNOSIS — I130 Hypertensive heart and chronic kidney disease with heart failure and stage 1 through stage 4 chronic kidney disease, or unspecified chronic kidney disease: Secondary | ICD-10-CM | POA: Diagnosis not present

## 2016-07-11 DIAGNOSIS — I5032 Chronic diastolic (congestive) heart failure: Secondary | ICD-10-CM | POA: Diagnosis not present

## 2016-07-11 DIAGNOSIS — N189 Chronic kidney disease, unspecified: Secondary | ICD-10-CM | POA: Diagnosis not present

## 2016-07-11 DIAGNOSIS — F329 Major depressive disorder, single episode, unspecified: Secondary | ICD-10-CM | POA: Diagnosis not present

## 2016-07-12 DIAGNOSIS — J449 Chronic obstructive pulmonary disease, unspecified: Secondary | ICD-10-CM | POA: Diagnosis not present

## 2016-07-12 DIAGNOSIS — E1140 Type 2 diabetes mellitus with diabetic neuropathy, unspecified: Secondary | ICD-10-CM | POA: Diagnosis not present

## 2016-07-12 DIAGNOSIS — D649 Anemia, unspecified: Secondary | ICD-10-CM | POA: Diagnosis not present

## 2016-07-12 DIAGNOSIS — F329 Major depressive disorder, single episode, unspecified: Secondary | ICD-10-CM | POA: Diagnosis not present

## 2016-07-12 DIAGNOSIS — I130 Hypertensive heart and chronic kidney disease with heart failure and stage 1 through stage 4 chronic kidney disease, or unspecified chronic kidney disease: Secondary | ICD-10-CM | POA: Diagnosis not present

## 2016-07-12 DIAGNOSIS — I4891 Unspecified atrial fibrillation: Secondary | ICD-10-CM | POA: Diagnosis not present

## 2016-07-12 DIAGNOSIS — I5032 Chronic diastolic (congestive) heart failure: Secondary | ICD-10-CM | POA: Diagnosis not present

## 2016-07-12 DIAGNOSIS — N189 Chronic kidney disease, unspecified: Secondary | ICD-10-CM | POA: Diagnosis not present

## 2016-07-12 DIAGNOSIS — E1122 Type 2 diabetes mellitus with diabetic chronic kidney disease: Secondary | ICD-10-CM | POA: Diagnosis not present

## 2016-07-14 DIAGNOSIS — F329 Major depressive disorder, single episode, unspecified: Secondary | ICD-10-CM | POA: Diagnosis not present

## 2016-07-14 DIAGNOSIS — E1140 Type 2 diabetes mellitus with diabetic neuropathy, unspecified: Secondary | ICD-10-CM | POA: Diagnosis not present

## 2016-07-14 DIAGNOSIS — I130 Hypertensive heart and chronic kidney disease with heart failure and stage 1 through stage 4 chronic kidney disease, or unspecified chronic kidney disease: Secondary | ICD-10-CM | POA: Diagnosis not present

## 2016-07-14 DIAGNOSIS — J449 Chronic obstructive pulmonary disease, unspecified: Secondary | ICD-10-CM | POA: Diagnosis not present

## 2016-07-14 DIAGNOSIS — I4891 Unspecified atrial fibrillation: Secondary | ICD-10-CM | POA: Diagnosis not present

## 2016-07-14 DIAGNOSIS — N189 Chronic kidney disease, unspecified: Secondary | ICD-10-CM | POA: Diagnosis not present

## 2016-07-14 DIAGNOSIS — E1122 Type 2 diabetes mellitus with diabetic chronic kidney disease: Secondary | ICD-10-CM | POA: Diagnosis not present

## 2016-07-14 DIAGNOSIS — I5032 Chronic diastolic (congestive) heart failure: Secondary | ICD-10-CM | POA: Diagnosis not present

## 2016-07-14 DIAGNOSIS — D649 Anemia, unspecified: Secondary | ICD-10-CM | POA: Diagnosis not present

## 2016-07-17 DIAGNOSIS — J449 Chronic obstructive pulmonary disease, unspecified: Secondary | ICD-10-CM | POA: Diagnosis not present

## 2016-07-17 DIAGNOSIS — E119 Type 2 diabetes mellitus without complications: Secondary | ICD-10-CM | POA: Diagnosis not present

## 2016-07-17 DIAGNOSIS — I5031 Acute diastolic (congestive) heart failure: Secondary | ICD-10-CM | POA: Diagnosis not present

## 2016-07-20 DIAGNOSIS — I5032 Chronic diastolic (congestive) heart failure: Secondary | ICD-10-CM | POA: Diagnosis not present

## 2016-07-20 DIAGNOSIS — I4891 Unspecified atrial fibrillation: Secondary | ICD-10-CM | POA: Diagnosis not present

## 2016-07-20 DIAGNOSIS — N189 Chronic kidney disease, unspecified: Secondary | ICD-10-CM | POA: Diagnosis not present

## 2016-07-20 DIAGNOSIS — D649 Anemia, unspecified: Secondary | ICD-10-CM | POA: Diagnosis not present

## 2016-07-20 DIAGNOSIS — J449 Chronic obstructive pulmonary disease, unspecified: Secondary | ICD-10-CM | POA: Diagnosis not present

## 2016-07-20 DIAGNOSIS — I130 Hypertensive heart and chronic kidney disease with heart failure and stage 1 through stage 4 chronic kidney disease, or unspecified chronic kidney disease: Secondary | ICD-10-CM | POA: Diagnosis not present

## 2016-07-20 DIAGNOSIS — E1122 Type 2 diabetes mellitus with diabetic chronic kidney disease: Secondary | ICD-10-CM | POA: Diagnosis not present

## 2016-07-20 DIAGNOSIS — E1140 Type 2 diabetes mellitus with diabetic neuropathy, unspecified: Secondary | ICD-10-CM | POA: Diagnosis not present

## 2016-07-20 DIAGNOSIS — F329 Major depressive disorder, single episode, unspecified: Secondary | ICD-10-CM | POA: Diagnosis not present

## 2016-07-23 DIAGNOSIS — E1122 Type 2 diabetes mellitus with diabetic chronic kidney disease: Secondary | ICD-10-CM | POA: Diagnosis not present

## 2016-07-23 DIAGNOSIS — F329 Major depressive disorder, single episode, unspecified: Secondary | ICD-10-CM | POA: Diagnosis not present

## 2016-07-23 DIAGNOSIS — D649 Anemia, unspecified: Secondary | ICD-10-CM | POA: Diagnosis not present

## 2016-07-23 DIAGNOSIS — I5032 Chronic diastolic (congestive) heart failure: Secondary | ICD-10-CM | POA: Diagnosis not present

## 2016-07-23 DIAGNOSIS — I4891 Unspecified atrial fibrillation: Secondary | ICD-10-CM | POA: Diagnosis not present

## 2016-07-23 DIAGNOSIS — E1140 Type 2 diabetes mellitus with diabetic neuropathy, unspecified: Secondary | ICD-10-CM | POA: Diagnosis not present

## 2016-07-23 DIAGNOSIS — I130 Hypertensive heart and chronic kidney disease with heart failure and stage 1 through stage 4 chronic kidney disease, or unspecified chronic kidney disease: Secondary | ICD-10-CM | POA: Diagnosis not present

## 2016-07-23 DIAGNOSIS — N189 Chronic kidney disease, unspecified: Secondary | ICD-10-CM | POA: Diagnosis not present

## 2016-07-23 DIAGNOSIS — J449 Chronic obstructive pulmonary disease, unspecified: Secondary | ICD-10-CM | POA: Diagnosis not present

## 2016-07-26 DIAGNOSIS — I130 Hypertensive heart and chronic kidney disease with heart failure and stage 1 through stage 4 chronic kidney disease, or unspecified chronic kidney disease: Secondary | ICD-10-CM | POA: Diagnosis not present

## 2016-07-26 DIAGNOSIS — I5032 Chronic diastolic (congestive) heart failure: Secondary | ICD-10-CM | POA: Diagnosis not present

## 2016-07-26 DIAGNOSIS — E1140 Type 2 diabetes mellitus with diabetic neuropathy, unspecified: Secondary | ICD-10-CM | POA: Diagnosis not present

## 2016-07-26 DIAGNOSIS — N189 Chronic kidney disease, unspecified: Secondary | ICD-10-CM | POA: Diagnosis not present

## 2016-07-26 DIAGNOSIS — D649 Anemia, unspecified: Secondary | ICD-10-CM | POA: Diagnosis not present

## 2016-07-26 DIAGNOSIS — J449 Chronic obstructive pulmonary disease, unspecified: Secondary | ICD-10-CM | POA: Diagnosis not present

## 2016-07-26 DIAGNOSIS — E1122 Type 2 diabetes mellitus with diabetic chronic kidney disease: Secondary | ICD-10-CM | POA: Diagnosis not present

## 2016-07-26 DIAGNOSIS — F329 Major depressive disorder, single episode, unspecified: Secondary | ICD-10-CM | POA: Diagnosis not present

## 2016-07-26 DIAGNOSIS — I4891 Unspecified atrial fibrillation: Secondary | ICD-10-CM | POA: Diagnosis not present

## 2016-07-27 DIAGNOSIS — I130 Hypertensive heart and chronic kidney disease with heart failure and stage 1 through stage 4 chronic kidney disease, or unspecified chronic kidney disease: Secondary | ICD-10-CM | POA: Diagnosis not present

## 2016-07-27 DIAGNOSIS — E1140 Type 2 diabetes mellitus with diabetic neuropathy, unspecified: Secondary | ICD-10-CM | POA: Diagnosis not present

## 2016-07-27 DIAGNOSIS — D649 Anemia, unspecified: Secondary | ICD-10-CM | POA: Diagnosis not present

## 2016-07-27 DIAGNOSIS — I4891 Unspecified atrial fibrillation: Secondary | ICD-10-CM | POA: Diagnosis not present

## 2016-07-27 DIAGNOSIS — E1122 Type 2 diabetes mellitus with diabetic chronic kidney disease: Secondary | ICD-10-CM | POA: Diagnosis not present

## 2016-07-27 DIAGNOSIS — F329 Major depressive disorder, single episode, unspecified: Secondary | ICD-10-CM | POA: Diagnosis not present

## 2016-07-27 DIAGNOSIS — J449 Chronic obstructive pulmonary disease, unspecified: Secondary | ICD-10-CM | POA: Diagnosis not present

## 2016-07-27 DIAGNOSIS — N189 Chronic kidney disease, unspecified: Secondary | ICD-10-CM | POA: Diagnosis not present

## 2016-07-27 DIAGNOSIS — I5032 Chronic diastolic (congestive) heart failure: Secondary | ICD-10-CM | POA: Diagnosis not present

## 2016-07-31 DIAGNOSIS — F329 Major depressive disorder, single episode, unspecified: Secondary | ICD-10-CM | POA: Diagnosis not present

## 2016-07-31 DIAGNOSIS — E1122 Type 2 diabetes mellitus with diabetic chronic kidney disease: Secondary | ICD-10-CM | POA: Diagnosis not present

## 2016-07-31 DIAGNOSIS — I5032 Chronic diastolic (congestive) heart failure: Secondary | ICD-10-CM | POA: Diagnosis not present

## 2016-07-31 DIAGNOSIS — E1140 Type 2 diabetes mellitus with diabetic neuropathy, unspecified: Secondary | ICD-10-CM | POA: Diagnosis not present

## 2016-07-31 DIAGNOSIS — I4891 Unspecified atrial fibrillation: Secondary | ICD-10-CM | POA: Diagnosis not present

## 2016-07-31 DIAGNOSIS — J449 Chronic obstructive pulmonary disease, unspecified: Secondary | ICD-10-CM | POA: Diagnosis not present

## 2016-07-31 DIAGNOSIS — N189 Chronic kidney disease, unspecified: Secondary | ICD-10-CM | POA: Diagnosis not present

## 2016-07-31 DIAGNOSIS — I130 Hypertensive heart and chronic kidney disease with heart failure and stage 1 through stage 4 chronic kidney disease, or unspecified chronic kidney disease: Secondary | ICD-10-CM | POA: Diagnosis not present

## 2016-07-31 DIAGNOSIS — D649 Anemia, unspecified: Secondary | ICD-10-CM | POA: Diagnosis not present

## 2016-08-02 DIAGNOSIS — E1140 Type 2 diabetes mellitus with diabetic neuropathy, unspecified: Secondary | ICD-10-CM | POA: Diagnosis not present

## 2016-08-02 DIAGNOSIS — E1122 Type 2 diabetes mellitus with diabetic chronic kidney disease: Secondary | ICD-10-CM | POA: Diagnosis not present

## 2016-08-02 DIAGNOSIS — N189 Chronic kidney disease, unspecified: Secondary | ICD-10-CM | POA: Diagnosis not present

## 2016-08-02 DIAGNOSIS — I130 Hypertensive heart and chronic kidney disease with heart failure and stage 1 through stage 4 chronic kidney disease, or unspecified chronic kidney disease: Secondary | ICD-10-CM | POA: Diagnosis not present

## 2016-08-02 DIAGNOSIS — D649 Anemia, unspecified: Secondary | ICD-10-CM | POA: Diagnosis not present

## 2016-08-02 DIAGNOSIS — I4891 Unspecified atrial fibrillation: Secondary | ICD-10-CM | POA: Diagnosis not present

## 2016-08-02 DIAGNOSIS — I5032 Chronic diastolic (congestive) heart failure: Secondary | ICD-10-CM | POA: Diagnosis not present

## 2016-08-02 DIAGNOSIS — J449 Chronic obstructive pulmonary disease, unspecified: Secondary | ICD-10-CM | POA: Diagnosis not present

## 2016-08-02 DIAGNOSIS — F329 Major depressive disorder, single episode, unspecified: Secondary | ICD-10-CM | POA: Diagnosis not present

## 2016-08-03 DIAGNOSIS — I5032 Chronic diastolic (congestive) heart failure: Secondary | ICD-10-CM | POA: Diagnosis not present

## 2016-08-03 DIAGNOSIS — N189 Chronic kidney disease, unspecified: Secondary | ICD-10-CM | POA: Diagnosis not present

## 2016-08-03 DIAGNOSIS — I4891 Unspecified atrial fibrillation: Secondary | ICD-10-CM | POA: Diagnosis not present

## 2016-08-03 DIAGNOSIS — D649 Anemia, unspecified: Secondary | ICD-10-CM | POA: Diagnosis not present

## 2016-08-03 DIAGNOSIS — F329 Major depressive disorder, single episode, unspecified: Secondary | ICD-10-CM | POA: Diagnosis not present

## 2016-08-03 DIAGNOSIS — E1122 Type 2 diabetes mellitus with diabetic chronic kidney disease: Secondary | ICD-10-CM | POA: Diagnosis not present

## 2016-08-03 DIAGNOSIS — J449 Chronic obstructive pulmonary disease, unspecified: Secondary | ICD-10-CM | POA: Diagnosis not present

## 2016-08-03 DIAGNOSIS — E1140 Type 2 diabetes mellitus with diabetic neuropathy, unspecified: Secondary | ICD-10-CM | POA: Diagnosis not present

## 2016-08-03 DIAGNOSIS — I130 Hypertensive heart and chronic kidney disease with heart failure and stage 1 through stage 4 chronic kidney disease, or unspecified chronic kidney disease: Secondary | ICD-10-CM | POA: Diagnosis not present

## 2016-08-07 DIAGNOSIS — N179 Acute kidney failure, unspecified: Secondary | ICD-10-CM | POA: Diagnosis not present

## 2016-08-07 DIAGNOSIS — J449 Chronic obstructive pulmonary disease, unspecified: Secondary | ICD-10-CM | POA: Diagnosis not present

## 2016-08-07 DIAGNOSIS — F329 Major depressive disorder, single episode, unspecified: Secondary | ICD-10-CM | POA: Diagnosis not present

## 2016-08-07 DIAGNOSIS — Z634 Disappearance and death of family member: Secondary | ICD-10-CM | POA: Diagnosis not present

## 2016-08-07 DIAGNOSIS — I5032 Chronic diastolic (congestive) heart failure: Secondary | ICD-10-CM | POA: Diagnosis not present

## 2016-08-07 DIAGNOSIS — E1122 Type 2 diabetes mellitus with diabetic chronic kidney disease: Secondary | ICD-10-CM | POA: Diagnosis not present

## 2016-08-07 DIAGNOSIS — E1140 Type 2 diabetes mellitus with diabetic neuropathy, unspecified: Secondary | ICD-10-CM | POA: Diagnosis not present

## 2016-08-07 DIAGNOSIS — I4891 Unspecified atrial fibrillation: Secondary | ICD-10-CM | POA: Diagnosis not present

## 2016-08-07 DIAGNOSIS — J439 Emphysema, unspecified: Secondary | ICD-10-CM | POA: Diagnosis not present

## 2016-08-07 DIAGNOSIS — N189 Chronic kidney disease, unspecified: Secondary | ICD-10-CM | POA: Diagnosis not present

## 2016-08-07 DIAGNOSIS — I110 Hypertensive heart disease with heart failure: Secondary | ICD-10-CM | POA: Diagnosis not present

## 2016-08-07 DIAGNOSIS — E1136 Type 2 diabetes mellitus with diabetic cataract: Secondary | ICD-10-CM | POA: Diagnosis not present

## 2016-08-07 DIAGNOSIS — I5030 Unspecified diastolic (congestive) heart failure: Secondary | ICD-10-CM | POA: Diagnosis not present

## 2016-08-07 DIAGNOSIS — I130 Hypertensive heart and chronic kidney disease with heart failure and stage 1 through stage 4 chronic kidney disease, or unspecified chronic kidney disease: Secondary | ICD-10-CM | POA: Diagnosis not present

## 2016-08-07 DIAGNOSIS — D649 Anemia, unspecified: Secondary | ICD-10-CM | POA: Diagnosis not present

## 2016-08-07 DIAGNOSIS — I10 Essential (primary) hypertension: Secondary | ICD-10-CM | POA: Diagnosis not present

## 2016-08-07 DIAGNOSIS — I701 Atherosclerosis of renal artery: Secondary | ICD-10-CM | POA: Diagnosis not present

## 2016-08-08 DIAGNOSIS — F4320 Adjustment disorder, unspecified: Secondary | ICD-10-CM | POA: Diagnosis not present

## 2016-08-09 DIAGNOSIS — I130 Hypertensive heart and chronic kidney disease with heart failure and stage 1 through stage 4 chronic kidney disease, or unspecified chronic kidney disease: Secondary | ICD-10-CM | POA: Diagnosis not present

## 2016-08-09 DIAGNOSIS — E1122 Type 2 diabetes mellitus with diabetic chronic kidney disease: Secondary | ICD-10-CM | POA: Diagnosis not present

## 2016-08-09 DIAGNOSIS — I5032 Chronic diastolic (congestive) heart failure: Secondary | ICD-10-CM | POA: Diagnosis not present

## 2016-08-09 DIAGNOSIS — F329 Major depressive disorder, single episode, unspecified: Secondary | ICD-10-CM | POA: Diagnosis not present

## 2016-08-09 DIAGNOSIS — D649 Anemia, unspecified: Secondary | ICD-10-CM | POA: Diagnosis not present

## 2016-08-09 DIAGNOSIS — E1140 Type 2 diabetes mellitus with diabetic neuropathy, unspecified: Secondary | ICD-10-CM | POA: Diagnosis not present

## 2016-08-09 DIAGNOSIS — I4891 Unspecified atrial fibrillation: Secondary | ICD-10-CM | POA: Diagnosis not present

## 2016-08-09 DIAGNOSIS — N189 Chronic kidney disease, unspecified: Secondary | ICD-10-CM | POA: Diagnosis not present

## 2016-08-09 DIAGNOSIS — J449 Chronic obstructive pulmonary disease, unspecified: Secondary | ICD-10-CM | POA: Diagnosis not present

## 2016-08-17 DIAGNOSIS — E119 Type 2 diabetes mellitus without complications: Secondary | ICD-10-CM | POA: Diagnosis not present

## 2016-08-17 DIAGNOSIS — I5031 Acute diastolic (congestive) heart failure: Secondary | ICD-10-CM | POA: Diagnosis not present

## 2016-08-17 DIAGNOSIS — J449 Chronic obstructive pulmonary disease, unspecified: Secondary | ICD-10-CM | POA: Diagnosis not present

## 2016-09-13 DIAGNOSIS — I5032 Chronic diastolic (congestive) heart failure: Secondary | ICD-10-CM | POA: Diagnosis not present

## 2016-09-13 DIAGNOSIS — G4733 Obstructive sleep apnea (adult) (pediatric): Secondary | ICD-10-CM | POA: Diagnosis not present

## 2016-09-13 DIAGNOSIS — E039 Hypothyroidism, unspecified: Secondary | ICD-10-CM | POA: Diagnosis not present

## 2016-09-13 DIAGNOSIS — I10 Essential (primary) hypertension: Secondary | ICD-10-CM | POA: Diagnosis not present

## 2016-09-13 DIAGNOSIS — Z794 Long term (current) use of insulin: Secondary | ICD-10-CM | POA: Diagnosis not present

## 2016-09-13 DIAGNOSIS — E1140 Type 2 diabetes mellitus with diabetic neuropathy, unspecified: Secondary | ICD-10-CM | POA: Diagnosis not present

## 2016-09-13 DIAGNOSIS — R5383 Other fatigue: Secondary | ICD-10-CM | POA: Diagnosis not present

## 2016-09-16 DIAGNOSIS — J449 Chronic obstructive pulmonary disease, unspecified: Secondary | ICD-10-CM | POA: Diagnosis not present

## 2016-09-16 DIAGNOSIS — E119 Type 2 diabetes mellitus without complications: Secondary | ICD-10-CM | POA: Diagnosis not present

## 2016-09-16 DIAGNOSIS — I5031 Acute diastolic (congestive) heart failure: Secondary | ICD-10-CM | POA: Diagnosis not present

## 2016-09-18 DIAGNOSIS — H2511 Age-related nuclear cataract, right eye: Secondary | ICD-10-CM | POA: Diagnosis not present

## 2016-09-18 DIAGNOSIS — Z961 Presence of intraocular lens: Secondary | ICD-10-CM | POA: Diagnosis not present

## 2016-09-18 DIAGNOSIS — R5383 Other fatigue: Secondary | ICD-10-CM | POA: Diagnosis not present

## 2016-09-18 DIAGNOSIS — E113313 Type 2 diabetes mellitus with moderate nonproliferative diabetic retinopathy with macular edema, bilateral: Secondary | ICD-10-CM | POA: Diagnosis not present

## 2016-09-18 DIAGNOSIS — H43393 Other vitreous opacities, bilateral: Secondary | ICD-10-CM | POA: Diagnosis not present

## 2016-10-17 DIAGNOSIS — J449 Chronic obstructive pulmonary disease, unspecified: Secondary | ICD-10-CM | POA: Diagnosis not present

## 2016-10-17 DIAGNOSIS — E119 Type 2 diabetes mellitus without complications: Secondary | ICD-10-CM | POA: Diagnosis not present

## 2016-10-17 DIAGNOSIS — I5031 Acute diastolic (congestive) heart failure: Secondary | ICD-10-CM | POA: Diagnosis not present

## 2016-11-11 DIAGNOSIS — I129 Hypertensive chronic kidney disease with stage 1 through stage 4 chronic kidney disease, or unspecified chronic kidney disease: Secondary | ICD-10-CM | POA: Diagnosis not present

## 2016-11-11 DIAGNOSIS — R001 Bradycardia, unspecified: Secondary | ICD-10-CM | POA: Diagnosis not present

## 2016-11-11 DIAGNOSIS — I10 Essential (primary) hypertension: Secondary | ICD-10-CM | POA: Diagnosis not present

## 2016-11-11 DIAGNOSIS — I700 Atherosclerosis of aorta: Secondary | ICD-10-CM | POA: Diagnosis not present

## 2016-11-11 DIAGNOSIS — I493 Ventricular premature depolarization: Secondary | ICD-10-CM | POA: Diagnosis not present

## 2016-11-11 DIAGNOSIS — I340 Nonrheumatic mitral (valve) insufficiency: Secondary | ICD-10-CM | POA: Diagnosis not present

## 2016-11-11 DIAGNOSIS — I130 Hypertensive heart and chronic kidney disease with heart failure and stage 1 through stage 4 chronic kidney disease, or unspecified chronic kidney disease: Secondary | ICD-10-CM | POA: Diagnosis not present

## 2016-11-11 DIAGNOSIS — J939 Pneumothorax, unspecified: Secondary | ICD-10-CM | POA: Diagnosis not present

## 2016-11-11 DIAGNOSIS — R9431 Abnormal electrocardiogram [ECG] [EKG]: Secondary | ICD-10-CM | POA: Diagnosis not present

## 2016-11-11 DIAGNOSIS — R55 Syncope and collapse: Secondary | ICD-10-CM | POA: Diagnosis not present

## 2016-11-11 DIAGNOSIS — R000 Tachycardia, unspecified: Secondary | ICD-10-CM | POA: Diagnosis not present

## 2016-11-11 DIAGNOSIS — I441 Atrioventricular block, second degree: Secondary | ICD-10-CM | POA: Diagnosis not present

## 2016-11-11 DIAGNOSIS — I4891 Unspecified atrial fibrillation: Secondary | ICD-10-CM | POA: Diagnosis not present

## 2016-11-11 DIAGNOSIS — Z794 Long term (current) use of insulin: Secondary | ICD-10-CM | POA: Diagnosis not present

## 2016-11-11 DIAGNOSIS — N179 Acute kidney failure, unspecified: Secondary | ICD-10-CM | POA: Diagnosis not present

## 2016-11-11 DIAGNOSIS — I519 Heart disease, unspecified: Secondary | ICD-10-CM | POA: Diagnosis not present

## 2016-11-11 DIAGNOSIS — I517 Cardiomegaly: Secondary | ICD-10-CM | POA: Diagnosis not present

## 2016-11-11 DIAGNOSIS — E1122 Type 2 diabetes mellitus with diabetic chronic kidney disease: Secondary | ICD-10-CM | POA: Diagnosis not present

## 2016-11-11 DIAGNOSIS — I5033 Acute on chronic diastolic (congestive) heart failure: Secondary | ICD-10-CM | POA: Diagnosis not present

## 2016-11-11 DIAGNOSIS — N189 Chronic kidney disease, unspecified: Secondary | ICD-10-CM | POA: Diagnosis not present

## 2016-11-11 DIAGNOSIS — E1151 Type 2 diabetes mellitus with diabetic peripheral angiopathy without gangrene: Secondary | ICD-10-CM | POA: Diagnosis not present

## 2016-11-11 DIAGNOSIS — E119 Type 2 diabetes mellitus without complications: Secondary | ICD-10-CM | POA: Diagnosis not present

## 2016-11-11 DIAGNOSIS — J449 Chronic obstructive pulmonary disease, unspecified: Secondary | ICD-10-CM | POA: Diagnosis not present

## 2016-11-11 DIAGNOSIS — R0602 Shortness of breath: Secondary | ICD-10-CM | POA: Diagnosis not present

## 2016-11-11 DIAGNOSIS — R600 Localized edema: Secondary | ICD-10-CM | POA: Diagnosis not present

## 2016-11-11 DIAGNOSIS — J811 Chronic pulmonary edema: Secondary | ICD-10-CM | POA: Diagnosis not present

## 2016-11-16 DIAGNOSIS — I5031 Acute diastolic (congestive) heart failure: Secondary | ICD-10-CM | POA: Diagnosis not present

## 2016-11-16 DIAGNOSIS — J449 Chronic obstructive pulmonary disease, unspecified: Secondary | ICD-10-CM | POA: Diagnosis not present

## 2016-11-16 DIAGNOSIS — E119 Type 2 diabetes mellitus without complications: Secondary | ICD-10-CM | POA: Diagnosis not present

## 2016-11-18 DIAGNOSIS — Z794 Long term (current) use of insulin: Secondary | ICD-10-CM | POA: Diagnosis not present

## 2016-11-18 DIAGNOSIS — Z7982 Long term (current) use of aspirin: Secondary | ICD-10-CM | POA: Diagnosis not present

## 2016-11-18 DIAGNOSIS — Z950 Presence of cardiac pacemaker: Secondary | ICD-10-CM | POA: Diagnosis not present

## 2016-11-18 DIAGNOSIS — R110 Nausea: Secondary | ICD-10-CM | POA: Diagnosis not present

## 2016-11-18 DIAGNOSIS — E669 Obesity, unspecified: Secondary | ICD-10-CM | POA: Diagnosis not present

## 2016-11-18 DIAGNOSIS — R109 Unspecified abdominal pain: Secondary | ICD-10-CM | POA: Diagnosis not present

## 2016-11-18 DIAGNOSIS — R197 Diarrhea, unspecified: Secondary | ICD-10-CM | POA: Diagnosis not present

## 2016-11-18 DIAGNOSIS — Z825 Family history of asthma and other chronic lower respiratory diseases: Secondary | ICD-10-CM | POA: Diagnosis not present

## 2016-11-18 DIAGNOSIS — Z882 Allergy status to sulfonamides status: Secondary | ICD-10-CM | POA: Diagnosis not present

## 2016-11-18 DIAGNOSIS — R011 Cardiac murmur, unspecified: Secondary | ICD-10-CM | POA: Diagnosis not present

## 2016-11-20 DIAGNOSIS — R2689 Other abnormalities of gait and mobility: Secondary | ICD-10-CM | POA: Diagnosis not present

## 2016-11-20 DIAGNOSIS — R072 Precordial pain: Secondary | ICD-10-CM | POA: Diagnosis not present

## 2016-11-20 DIAGNOSIS — R079 Chest pain, unspecified: Secondary | ICD-10-CM | POA: Diagnosis not present

## 2016-11-20 DIAGNOSIS — Z8673 Personal history of transient ischemic attack (TIA), and cerebral infarction without residual deficits: Secondary | ICD-10-CM | POA: Diagnosis not present

## 2016-11-20 DIAGNOSIS — Z7901 Long term (current) use of anticoagulants: Secondary | ICD-10-CM | POA: Diagnosis not present

## 2016-11-20 DIAGNOSIS — Z79899 Other long term (current) drug therapy: Secondary | ICD-10-CM | POA: Diagnosis not present

## 2016-11-20 DIAGNOSIS — M79622 Pain in left upper arm: Secondary | ICD-10-CM | POA: Diagnosis not present

## 2016-11-20 DIAGNOSIS — I5032 Chronic diastolic (congestive) heart failure: Secondary | ICD-10-CM | POA: Diagnosis not present

## 2016-11-20 DIAGNOSIS — R1013 Epigastric pain: Secondary | ICD-10-CM | POA: Diagnosis not present

## 2016-11-20 DIAGNOSIS — R42 Dizziness and giddiness: Secondary | ICD-10-CM | POA: Diagnosis not present

## 2016-11-20 DIAGNOSIS — J90 Pleural effusion, not elsewhere classified: Secondary | ICD-10-CM | POA: Diagnosis not present

## 2016-11-20 DIAGNOSIS — Z09 Encounter for follow-up examination after completed treatment for conditions other than malignant neoplasm: Secondary | ICD-10-CM | POA: Diagnosis not present

## 2016-11-20 DIAGNOSIS — J811 Chronic pulmonary edema: Secondary | ICD-10-CM | POA: Diagnosis not present

## 2016-11-20 DIAGNOSIS — I130 Hypertensive heart and chronic kidney disease with heart failure and stage 1 through stage 4 chronic kidney disease, or unspecified chronic kidney disease: Secondary | ICD-10-CM | POA: Diagnosis not present

## 2016-11-20 DIAGNOSIS — R0789 Other chest pain: Secondary | ICD-10-CM | POA: Diagnosis not present

## 2016-11-21 DIAGNOSIS — R079 Chest pain, unspecified: Secondary | ICD-10-CM | POA: Diagnosis not present

## 2016-11-21 DIAGNOSIS — R1013 Epigastric pain: Secondary | ICD-10-CM | POA: Diagnosis not present

## 2016-11-21 DIAGNOSIS — I5030 Unspecified diastolic (congestive) heart failure: Secondary | ICD-10-CM | POA: Diagnosis not present

## 2016-11-21 DIAGNOSIS — I10 Essential (primary) hypertension: Secondary | ICD-10-CM | POA: Diagnosis not present

## 2016-11-21 DIAGNOSIS — I25119 Atherosclerotic heart disease of native coronary artery with unspecified angina pectoris: Secondary | ICD-10-CM | POA: Diagnosis not present

## 2016-11-22 DIAGNOSIS — I5030 Unspecified diastolic (congestive) heart failure: Secondary | ICD-10-CM | POA: Diagnosis not present

## 2016-11-22 DIAGNOSIS — I10 Essential (primary) hypertension: Secondary | ICD-10-CM | POA: Diagnosis not present

## 2016-11-23 DIAGNOSIS — I10 Essential (primary) hypertension: Secondary | ICD-10-CM | POA: Diagnosis not present

## 2016-11-23 DIAGNOSIS — E119 Type 2 diabetes mellitus without complications: Secondary | ICD-10-CM | POA: Diagnosis not present

## 2016-11-30 DIAGNOSIS — E1122 Type 2 diabetes mellitus with diabetic chronic kidney disease: Secondary | ICD-10-CM | POA: Diagnosis not present

## 2016-11-30 DIAGNOSIS — I129 Hypertensive chronic kidney disease with stage 1 through stage 4 chronic kidney disease, or unspecified chronic kidney disease: Secondary | ICD-10-CM | POA: Diagnosis not present

## 2016-11-30 DIAGNOSIS — I130 Hypertensive heart and chronic kidney disease with heart failure and stage 1 through stage 4 chronic kidney disease, or unspecified chronic kidney disease: Secondary | ICD-10-CM | POA: Diagnosis not present

## 2016-11-30 DIAGNOSIS — I5033 Acute on chronic diastolic (congestive) heart failure: Secondary | ICD-10-CM | POA: Diagnosis not present

## 2016-11-30 DIAGNOSIS — N183 Chronic kidney disease, stage 3 (moderate): Secondary | ICD-10-CM | POA: Diagnosis not present

## 2016-11-30 DIAGNOSIS — R5383 Other fatigue: Secondary | ICD-10-CM | POA: Diagnosis not present

## 2016-11-30 DIAGNOSIS — I480 Paroxysmal atrial fibrillation: Secondary | ICD-10-CM | POA: Diagnosis not present

## 2016-11-30 DIAGNOSIS — I5030 Unspecified diastolic (congestive) heart failure: Secondary | ICD-10-CM | POA: Diagnosis not present

## 2016-11-30 DIAGNOSIS — N189 Chronic kidney disease, unspecified: Secondary | ICD-10-CM | POA: Diagnosis not present

## 2016-11-30 DIAGNOSIS — D631 Anemia in chronic kidney disease: Secondary | ICD-10-CM | POA: Diagnosis not present

## 2016-11-30 DIAGNOSIS — I959 Hypotension, unspecified: Secondary | ICD-10-CM | POA: Diagnosis not present

## 2016-11-30 DIAGNOSIS — R55 Syncope and collapse: Secondary | ICD-10-CM | POA: Diagnosis not present

## 2016-12-17 DIAGNOSIS — E119 Type 2 diabetes mellitus without complications: Secondary | ICD-10-CM | POA: Diagnosis not present

## 2016-12-17 DIAGNOSIS — J449 Chronic obstructive pulmonary disease, unspecified: Secondary | ICD-10-CM | POA: Diagnosis not present

## 2016-12-17 DIAGNOSIS — I5031 Acute diastolic (congestive) heart failure: Secondary | ICD-10-CM | POA: Diagnosis not present

## 2017-01-05 ENCOUNTER — Emergency Department: Payer: Medicare HMO

## 2017-01-05 ENCOUNTER — Inpatient Hospital Stay
Admission: EM | Admit: 2017-01-05 | Discharge: 2017-01-11 | DRG: 064 | Disposition: A | Discharge status: Skilled Nursing Facility | Payer: Medicare HMO | Attending: Internal Medicine | Admitting: Internal Medicine

## 2017-01-05 DIAGNOSIS — R109 Unspecified abdominal pain: Secondary | ICD-10-CM | POA: Diagnosis not present

## 2017-01-05 DIAGNOSIS — K5900 Constipation, unspecified: Secondary | ICD-10-CM | POA: Diagnosis not present

## 2017-01-05 DIAGNOSIS — E875 Hyperkalemia: Secondary | ICD-10-CM | POA: Diagnosis present

## 2017-01-05 DIAGNOSIS — R4182 Altered mental status, unspecified: Secondary | ICD-10-CM | POA: Diagnosis not present

## 2017-01-05 DIAGNOSIS — I5032 Chronic diastolic (congestive) heart failure: Secondary | ICD-10-CM | POA: Diagnosis present

## 2017-01-05 DIAGNOSIS — N261 Atrophy of kidney (terminal): Secondary | ICD-10-CM | POA: Diagnosis present

## 2017-01-05 DIAGNOSIS — N183 Chronic kidney disease, stage 3 (moderate): Secondary | ICD-10-CM | POA: Diagnosis not present

## 2017-01-05 DIAGNOSIS — M6281 Muscle weakness (generalized): Secondary | ICD-10-CM | POA: Diagnosis not present

## 2017-01-05 DIAGNOSIS — Z7902 Long term (current) use of antithrombotics/antiplatelets: Secondary | ICD-10-CM

## 2017-01-05 DIAGNOSIS — F0390 Unspecified dementia without behavioral disturbance: Secondary | ICD-10-CM | POA: Diagnosis present

## 2017-01-05 DIAGNOSIS — G9340 Encephalopathy, unspecified: Secondary | ICD-10-CM | POA: Diagnosis present

## 2017-01-05 DIAGNOSIS — I110 Hypertensive heart disease with heart failure: Secondary | ICD-10-CM | POA: Diagnosis not present

## 2017-01-05 DIAGNOSIS — D631 Anemia in chronic kidney disease: Secondary | ICD-10-CM | POA: Diagnosis not present

## 2017-01-05 DIAGNOSIS — N289 Disorder of kidney and ureter, unspecified: Secondary | ICD-10-CM | POA: Diagnosis not present

## 2017-01-05 DIAGNOSIS — J441 Chronic obstructive pulmonary disease with (acute) exacerbation: Secondary | ICD-10-CM | POA: Diagnosis not present

## 2017-01-05 DIAGNOSIS — N179 Acute kidney failure, unspecified: Secondary | ICD-10-CM | POA: Diagnosis not present

## 2017-01-05 DIAGNOSIS — G8314 Monoplegia of lower limb affecting left nondominant side: Secondary | ICD-10-CM | POA: Diagnosis present

## 2017-01-05 DIAGNOSIS — I4891 Unspecified atrial fibrillation: Secondary | ICD-10-CM | POA: Diagnosis not present

## 2017-01-05 DIAGNOSIS — Z7982 Long term (current) use of aspirin: Secondary | ICD-10-CM

## 2017-01-05 DIAGNOSIS — I10 Essential (primary) hypertension: Secondary | ICD-10-CM | POA: Diagnosis not present

## 2017-01-05 DIAGNOSIS — Z950 Presence of cardiac pacemaker: Secondary | ICD-10-CM

## 2017-01-05 DIAGNOSIS — M549 Dorsalgia, unspecified: Secondary | ICD-10-CM | POA: Diagnosis present

## 2017-01-05 DIAGNOSIS — Z7901 Long term (current) use of anticoagulants: Secondary | ICD-10-CM

## 2017-01-05 DIAGNOSIS — E1140 Type 2 diabetes mellitus with diabetic neuropathy, unspecified: Secondary | ICD-10-CM | POA: Diagnosis not present

## 2017-01-05 DIAGNOSIS — E039 Hypothyroidism, unspecified: Secondary | ICD-10-CM | POA: Diagnosis present

## 2017-01-05 DIAGNOSIS — I130 Hypertensive heart and chronic kidney disease with heart failure and stage 1 through stage 4 chronic kidney disease, or unspecified chronic kidney disease: Secondary | ICD-10-CM | POA: Diagnosis not present

## 2017-01-05 DIAGNOSIS — K7581 Nonalcoholic steatohepatitis (NASH): Secondary | ICD-10-CM | POA: Diagnosis present

## 2017-01-05 DIAGNOSIS — M25552 Pain in left hip: Secondary | ICD-10-CM | POA: Diagnosis not present

## 2017-01-05 DIAGNOSIS — Z6834 Body mass index (BMI) 34.0-34.9, adult: Secondary | ICD-10-CM | POA: Diagnosis not present

## 2017-01-05 DIAGNOSIS — Z9911 Dependence on respirator [ventilator] status: Secondary | ICD-10-CM | POA: Diagnosis not present

## 2017-01-05 DIAGNOSIS — G8929 Other chronic pain: Secondary | ICD-10-CM | POA: Diagnosis present

## 2017-01-05 DIAGNOSIS — R531 Weakness: Secondary | ICD-10-CM | POA: Diagnosis not present

## 2017-01-05 DIAGNOSIS — J440 Chronic obstructive pulmonary disease with acute lower respiratory infection: Secondary | ICD-10-CM | POA: Diagnosis present

## 2017-01-05 DIAGNOSIS — N184 Chronic kidney disease, stage 4 (severe): Secondary | ICD-10-CM | POA: Diagnosis present

## 2017-01-05 DIAGNOSIS — Z880 Allergy status to penicillin: Secondary | ICD-10-CM

## 2017-01-05 DIAGNOSIS — R6889 Other general symptoms and signs: Secondary | ICD-10-CM | POA: Diagnosis not present

## 2017-01-05 DIAGNOSIS — E1122 Type 2 diabetes mellitus with diabetic chronic kidney disease: Secondary | ICD-10-CM | POA: Diagnosis present

## 2017-01-05 DIAGNOSIS — Z882 Allergy status to sulfonamides status: Secondary | ICD-10-CM

## 2017-01-05 DIAGNOSIS — I639 Cerebral infarction, unspecified: Secondary | ICD-10-CM | POA: Diagnosis not present

## 2017-01-05 DIAGNOSIS — R4020 Unspecified coma: Secondary | ICD-10-CM | POA: Diagnosis not present

## 2017-01-05 DIAGNOSIS — Z79899 Other long term (current) drug therapy: Secondary | ICD-10-CM

## 2017-01-05 DIAGNOSIS — Z794 Long term (current) use of insulin: Secondary | ICD-10-CM

## 2017-01-05 DIAGNOSIS — I69398 Other sequelae of cerebral infarction: Secondary | ICD-10-CM | POA: Diagnosis not present

## 2017-01-05 DIAGNOSIS — I480 Paroxysmal atrial fibrillation: Secondary | ICD-10-CM | POA: Diagnosis not present

## 2017-01-05 DIAGNOSIS — A419 Sepsis, unspecified organism: Secondary | ICD-10-CM | POA: Diagnosis not present

## 2017-01-05 DIAGNOSIS — Z87891 Personal history of nicotine dependence: Secondary | ICD-10-CM

## 2017-01-05 DIAGNOSIS — G459 Transient cerebral ischemic attack, unspecified: Secondary | ICD-10-CM | POA: Diagnosis not present

## 2017-01-05 DIAGNOSIS — J189 Pneumonia, unspecified organism: Secondary | ICD-10-CM | POA: Diagnosis present

## 2017-01-05 DIAGNOSIS — R6521 Severe sepsis with septic shock: Secondary | ICD-10-CM

## 2017-01-05 DIAGNOSIS — I441 Atrioventricular block, second degree: Secondary | ICD-10-CM | POA: Diagnosis not present

## 2017-01-05 DIAGNOSIS — Z888 Allergy status to other drugs, medicaments and biological substances status: Secondary | ICD-10-CM

## 2017-01-05 DIAGNOSIS — I5033 Acute on chronic diastolic (congestive) heart failure: Secondary | ICD-10-CM | POA: Diagnosis not present

## 2017-01-05 DIAGNOSIS — Z8711 Personal history of peptic ulcer disease: Secondary | ICD-10-CM

## 2017-01-05 DIAGNOSIS — R7989 Other specified abnormal findings of blood chemistry: Secondary | ICD-10-CM | POA: Diagnosis not present

## 2017-01-05 DIAGNOSIS — I6523 Occlusion and stenosis of bilateral carotid arteries: Secondary | ICD-10-CM | POA: Diagnosis not present

## 2017-01-05 DIAGNOSIS — R52 Pain, unspecified: Secondary | ICD-10-CM

## 2017-01-05 HISTORY — DX: Unspecified dementia without behavioral disturbance: F03.90

## 2017-01-05 HISTORY — DX: Unspecified dementia, mild, without behavioral disturbance, psychotic disturbance, mood disturbance, and anxiety: F03.A0

## 2017-01-05 LAB — URINALYSIS, ROUTINE W REFLEX MICROSCOPIC
BACTERIA UA: NONE SEEN
BILIRUBIN URINE: NEGATIVE
Glucose, UA: >=500 mg/dL — AB
KETONES UR: NEGATIVE mg/dL
Leukocytes, UA: NEGATIVE
Nitrite: NEGATIVE
Specific Gravity, Urine: 1.017 (ref 1.005–1.030)
pH: 5 (ref 5.0–8.0)

## 2017-01-05 LAB — CBC WITH DIFFERENTIAL/PLATELET
Basophils Absolute: 0.1 10*3/uL (ref 0–0.1)
Basophils Relative: 1 %
Eosinophils Absolute: 0.2 10*3/uL (ref 0–0.7)
Eosinophils Relative: 1 %
HCT: 33.9 % — ABNORMAL LOW (ref 35.0–47.0)
HEMOGLOBIN: 11.2 g/dL — AB (ref 12.0–16.0)
LYMPHS ABS: 1.4 10*3/uL (ref 1.0–3.6)
LYMPHS PCT: 13 %
MCH: 27.4 pg (ref 26.0–34.0)
MCHC: 33 g/dL (ref 32.0–36.0)
MCV: 82.8 fL (ref 80.0–100.0)
MONOS PCT: 10 %
Monocytes Absolute: 1.1 10*3/uL — ABNORMAL HIGH (ref 0.2–0.9)
NEUTROS PCT: 75 %
Neutro Abs: 8.6 10*3/uL — ABNORMAL HIGH (ref 1.4–6.5)
Platelets: 156 10*3/uL (ref 150–440)
RBC: 4.09 MIL/uL (ref 3.80–5.20)
RDW: 16.1 % — ABNORMAL HIGH (ref 11.5–14.5)
WBC: 11.3 10*3/uL — AB (ref 3.6–11.0)

## 2017-01-05 LAB — GLUCOSE, CAPILLARY
Glucose-Capillary: 153 mg/dL — ABNORMAL HIGH (ref 65–99)
Glucose-Capillary: 154 mg/dL — ABNORMAL HIGH (ref 65–99)
Glucose-Capillary: 148 mg/dL — ABNORMAL HIGH (ref 65–99)
Glucose-Capillary: 124 mg/dL — ABNORMAL HIGH (ref 65–99)

## 2017-01-05 LAB — COMPREHENSIVE METABOLIC PANEL
ALT: 22 U/L (ref 14–54)
AST: 45 U/L — AB (ref 15–41)
Albumin: 3.1 g/dL — ABNORMAL LOW (ref 3.5–5.0)
Alkaline Phosphatase: 107 U/L (ref 38–126)
Anion gap: 6 (ref 5–15)
BUN: 37 mg/dL — AB (ref 6–20)
CHLORIDE: 108 mmol/L (ref 101–111)
CO2: 23 mmol/L (ref 22–32)
CREATININE: 2.08 mg/dL — AB (ref 0.44–1.00)
Calcium: 8.8 mg/dL — ABNORMAL LOW (ref 8.9–10.3)
GFR, EST AFRICAN AMERICAN: 26 mL/min — AB (ref >60)
GFR, EST NON AFRICAN AMERICAN: 23 mL/min — AB (ref >60)
Glucose, Bld: 167 mg/dL — ABNORMAL HIGH (ref 65–99)
POTASSIUM: 5.1 mmol/L (ref 3.5–5.1)
SODIUM: 137 mmol/L (ref 135–145)
Total Bilirubin: 0.4 mg/dL (ref 0.3–1.2)
Total Protein: 6.4 g/dL — ABNORMAL LOW (ref 6.5–8.1)

## 2017-01-05 LAB — URINE DRUG SCREEN, QUALITATIVE (ARMC ONLY)
AMPHETAMINES, UR SCREEN: NOT DETECTED
BENZODIAZEPINE, UR SCRN: NOT DETECTED
Barbiturates, Ur Screen: NOT DETECTED
CANNABINOID 50 NG, UR ~~LOC~~: NOT DETECTED
Cocaine Metabolite,Ur ~~LOC~~: NOT DETECTED
MDMA (Ecstasy)Ur Screen: NOT DETECTED
Methadone Scn, Ur: NOT DETECTED
OPIATE, UR SCREEN: NOT DETECTED
PHENCYCLIDINE (PCP) UR S: NOT DETECTED
Tricyclic, Ur Screen: NOT DETECTED

## 2017-01-05 LAB — LACTIC ACID, PLASMA
LACTIC ACID, VENOUS: 0.9 mmol/L (ref 0.5–1.9)
LACTIC ACID, VENOUS: 0.8 mmol/L (ref 0.5–1.9)

## 2017-01-05 LAB — PROCALCITONIN: Procalcitonin: 0.67 ng/mL

## 2017-01-05 LAB — TROPONIN I
TROPONIN I: 0.06 ng/mL — AB (ref <0.03)
Troponin I: 0.07 ng/mL (ref <0.03)

## 2017-01-05 MED ORDER — LEVOFLOXACIN IN D5W 750 MG/150ML IV SOLN: 750.0000 mg | INTRAVENOUS | Status: DC

## 2017-01-05 MED ORDER — METOPROLOL TARTRATE 5 MG/5ML IV SOLN: 5.0000 mg | INTRAVENOUS | Status: DC | PRN

## 2017-01-05 MED ORDER — ACETAMINOPHEN 325 MG PO TABS
650.0000 mg | ORAL_TABLET | Freq: Four times a day (QID) | ORAL | Status: DC | PRN
  Administered 2017-01-06 – 2017-01-09 (×5): 650 mg via ORAL
  Filled 2017-01-05 (×5): qty 2

## 2017-01-05 MED ORDER — ONDANSETRON HCL 4 MG/2ML IJ SOLN: 4.0000 mg | Freq: Four times a day (QID) | INTRAMUSCULAR | Status: DC | PRN

## 2017-01-05 MED ORDER — SODIUM CHLORIDE 0.9 % IV BOLUS (SEPSIS)
1000.0000 mL | Freq: Once | INTRAVENOUS | Status: AC
  Administered 2017-01-05: 1000 mL via INTRAVENOUS

## 2017-01-05 MED ORDER — VANCOMYCIN HCL IN DEXTROSE 1-5 GM/200ML-% IV SOLN: 1000.0000 mg | INTRAVENOUS | Status: DC

## 2017-01-05 MED ORDER — SODIUM CHLORIDE 0.9 % IV SOLN
INTRAVENOUS | Status: DC
  Administered 2017-01-05 – 2017-01-07 (×4): via INTRAVENOUS

## 2017-01-05 MED ORDER — INSULIN ASPART 100 UNIT/ML ~~LOC~~ SOLN
0.0000 [IU] | Freq: Three times a day (TID) | SUBCUTANEOUS | Status: DC
  Administered 2017-01-05 – 2017-01-06 (×2): 1 [IU] via SUBCUTANEOUS
  Administered 2017-01-07 (×2): 2 [IU] via SUBCUTANEOUS
  Administered 2017-01-07 – 2017-01-08 (×2): 1 [IU] via SUBCUTANEOUS
  Administered 2017-01-08 (×2): 2 [IU] via SUBCUTANEOUS
  Administered 2017-01-09 (×2): 3 [IU] via SUBCUTANEOUS
  Administered 2017-01-09: 17:00:00 2 [IU] via SUBCUTANEOUS
  Administered 2017-01-10: 5 [IU] via SUBCUTANEOUS
  Administered 2017-01-10 – 2017-01-11 (×3): 2 [IU] via SUBCUTANEOUS
  Filled 2017-01-05 (×15): qty 1

## 2017-01-05 MED ORDER — INSULIN GLARGINE 100 UNIT/ML ~~LOC~~ SOLN
15.0000 [IU] | Freq: Every day | SUBCUTANEOUS | Status: DC
  Administered 2017-01-06 – 2017-01-10 (×5): 15 [IU] via SUBCUTANEOUS
  Filled 2017-01-05 (×6): qty 0.15

## 2017-01-05 MED ORDER — LEVOTHYROXINE SODIUM 100 MCG IV SOLR
88.0000 ug | Freq: Every day | INTRAVENOUS | Status: DC
  Administered 2017-01-06 – 2017-01-07 (×2): 88 ug via INTRAVENOUS
  Filled 2017-01-05 (×3): qty 5

## 2017-01-05 MED ORDER — LEVOFLOXACIN IN D5W 750 MG/150ML IV SOLN
750.0000 mg | Freq: Once | INTRAVENOUS | Status: AC
  Administered 2017-01-05: 750 mg via INTRAVENOUS
  Filled 2017-01-05: qty 150

## 2017-01-05 MED ORDER — INSULIN ASPART 100 UNIT/ML ~~LOC~~ SOLN
0.0000 [IU] | Freq: Every day | SUBCUTANEOUS | Status: DC
  Administered 2017-01-10: 20:00:00 5 [IU] via SUBCUTANEOUS
  Filled 2017-01-05: qty 1

## 2017-01-05 MED ORDER — ONDANSETRON HCL 4 MG PO TABS
4.0000 mg | ORAL_TABLET | Freq: Four times a day (QID) | ORAL | Status: DC | PRN
  Filled 2017-01-05: qty 1

## 2017-01-05 MED ORDER — ENOXAPARIN SODIUM 40 MG/0.4ML ~~LOC~~ SOLN
30.0000 mg | SUBCUTANEOUS | Status: DC
  Administered 2017-01-05: 18:00:00 30 mg via SUBCUTANEOUS
  Filled 2017-01-05: qty 0.4

## 2017-01-05 MED ORDER — VANCOMYCIN HCL 10 G IV SOLR
1250.0000 mg | INTRAVENOUS | Status: DC
  Administered 2017-01-06: 04:00:00 1250 mg via INTRAVENOUS
  Filled 2017-01-05 (×2): qty 1250

## 2017-01-05 MED ORDER — VANCOMYCIN HCL IN DEXTROSE 1-5 GM/200ML-% IV SOLN
1000.0000 mg | Freq: Once | INTRAVENOUS | Status: AC
  Administered 2017-01-05: 1000 mg via INTRAVENOUS
  Filled 2017-01-05: qty 200

## 2017-01-05 MED ORDER — DEXTROSE 5 % IV SOLN
1.0000 g | Freq: Three times a day (TID) | INTRAVENOUS | Status: DC
  Administered 2017-01-05 – 2017-01-06 (×3): 1 g via INTRAVENOUS
  Filled 2017-01-05 (×5): qty 1

## 2017-01-05 MED ORDER — ACETAMINOPHEN 650 MG RE SUPP: 650.0000 mg | Freq: Four times a day (QID) | RECTAL | Status: DC | PRN

## 2017-01-05 MED ORDER — DEXTROSE 5 % IV SOLN
2.0000 g | Freq: Once | INTRAVENOUS | Status: AC
  Administered 2017-01-05: 2 g via INTRAVENOUS
  Filled 2017-01-05: qty 2

## 2017-01-05 NOTE — ED Notes (Signed)
Patient responds with to family, does not give clear answers. Patient's husband states patient was normal at 0200 last night, but was found minimally responsive at 0930 today.

## 2017-01-05 NOTE — ED Provider Notes (Signed)
[facility] Emergency Department Provider Note  ____________________________________________   I have reviewed the triage vital signs and the nursing notes.   HISTORY  Chief Complaint Loss of Consciousness   History limited by: Altered Mental Status   HPI Andrea [name] is a 71 y.o. female who presents to the emergency department today via EMS because of concern for decreased responsiveness from home. The patient herself is unfortunately unable to give history given altered mental status. Apparently husband found her altered this morning. Patient wears 02 at home.   Past Medical History:  Diagnosis Date  . CHF (congestive heart failure) ([place])   . Chronic back pain    Followed by pain clinic in [place]  . Endometriosis   . HTN (hypertension)   . Hypothyroidism   . IDDM (insulin dependent diabetes mellitus) ([place])   . NASH (nonalcoholic steatohepatitis)    has had liver biopsy in past  . Obesity   . PUD (peptic ulcer disease) 1990's    Patient Active Problem List   Diagnosis Date Noted  . CHF (congestive heart failure) ([place]) 06/18/2016  . GERD (gastroesophageal reflux disease) 03/07/2013  . Other and unspecified hyperlipidemia 03/07/2013  . Dizziness and giddiness 11/21/2012  . Urinary incontinence 09/11/2012  . Chronic low back pain 07/30/2012  . Anemia 07/30/2012  . Noncompliance 07/30/2012  . Neuropathy 01/12/2012  . Carotid stenosis, right 11/29/2011  . Diabetes mellitus type 2, uncontrolled ([place]) 11/29/2011  . Hypertension 11/29/2011  . Renal artery stenosis ([place]) 06/04/2011  . Constipation, chronic 06/04/2011  . Hypothyroidism 04/11/2011    Past Surgical History:  Procedure Laterality Date  . CHOLECYSTECTOMY    . TOTAL ABDOMINAL HYSTERECTOMY W/ BILATERAL SALPINGOOPHORECTOMY      Prior to Admission medications   Medication Sig Start Date End Date Taking? Authorizing Provider  acetaminophen (TYLENOL) 325 MG tablet Take 650 mg by  mouth 3 (three) times daily as needed.      [provider]  amLODipine (NORVASC) 10 MG tablet TAKE ONE TABLET BY MOUTH ONCE DAILY *NEEDS APPOINTMENT FOR FURTHER REFILLS* 02/23/14   Jackolyn Confer, MD  aspirin 81 MG tablet Take 81 mg by mouth daily.    [provider]  atorvastatin (LIPITOR) 20 MG tablet TAKE ONE TABLET BY MOUTH ONCE DAILY 12/10/13   Jackolyn Confer, MD  carvedilol (COREG) 6.25 MG tablet Take 6.25 mg by mouth 2 (two) times daily with a meal.    [provider]  Cholecalciferol (VITAMIN D HIGH POTENCY PO) Take 1 capsule by mouth daily.      [provider]  clopidogrel (PLAVIX) 75 MG tablet Take 1 tablet (75 mg total) by mouth daily. 12/29/12   Jackolyn Confer, MD  docusate sodium (COLACE) 100 MG capsule Take 100 mg by mouth 2 (two) times daily.    [provider]  furosemide (LASIX) 40 MG tablet Take 80 mg by mouth daily.    [provider]  gabapentin (NEURONTIN) 800 MG tablet Take 1 tablet (800 mg total) by mouth 2 (two) times daily. Patient taking differently: Take 600 mg by mouth 2 (two) times daily.  03/06/13   Jackolyn Confer, MD  insulin aspart (NOVOLOG) 100 UNIT/ML injection Inject 3-15 Units into the skin 3 (three) times daily before meals. Sliding scale 10/29/12   Jackolyn Confer, MD  Insulin Glargine (TOUJEO SOLOSTAR) 300 UNIT/ML SOPN Inject 60 Units into the skin daily. 06/19/16   Fritzi Mandes, MD  levothyroxine (SYNTHROID, [place]) 150 MCG  tablet TAKE ONE TABLET BY MOUTH EVERY DAY Patient taking differently: 175 mcg. TAKE ONE TABLET BY MOUTH EVERY DAY 10/17/12   Jackolyn Confer, MD  lidocaine (LIDODERM) 5 % Place 1 patch onto the skin daily. Remove & Discard patch within 12 hours or as directed by MD 11/21/12   Jackolyn Confer, MD  losartan (COZAAR) 50 MG tablet Take 1 tablet (50 mg total) by mouth daily. Patient taking differently: Take 100 mg by mouth daily.  11/28/12 06/17/16  Jackolyn Confer, MD   Multiple Vitamin (MULTIVITAMIN) tablet Take 1 tablet by mouth daily.    [provider]  PARoxetine (PAXIL) 20 MG tablet TAKE ONE TABLET BY MOUTH IN THE MORNING 10/17/12   Jackolyn Confer, MD  polyethylene glycol powder (GLYCOLAX/MIRALAX) powder Take 17 g by mouth 3 (three) times daily as needed. For constipation     [provider]  vitamin B-12 (CYANOCOBALAMIN) 1000 MCG tablet Take 1,000 mcg by mouth daily.    [provider]    Allergies Watermelon concentrate; Ace inhibitors; Celebrex [celecoxib]; Penicillins; and Sulfa antibiotics  Family History  Problem Relation Age of Onset  . COPD Sister   . Cancer Brother   . Emphysema Mother   . Heart disease Father     Social History Social History  Substance Use Topics  . Smoking status: Former Research scientist (life sciences)  . Smokeless tobacco: Never Used     Comment: Quit 1985  . Alcohol use No    Review of Systems Unable to obtain given AMS  ____________________________________________   PHYSICAL EXAM:  VITAL SIGNS: ED Triage Vitals  Enc Vitals Group     BP 01/05/17 1110 (!) 159/71     Pulse Rate 01/05/17 1110 69     Resp 01/05/17 1110 (!) 32     Temp 01/05/17 1110 100.1 F (37.8 C)     Temp Source 01/05/17 1110 Oral     SpO2 01/05/17 1110 99 %     Weight 01/05/17 1112 197 lb (89.4 kg)     Height 01/05/17 1112 5\' 3"  (1.6 m)   Constitutional: Somnolent. Will awaken to painful stimuli temporarily.  Eyes: Conjunctivae are normal.  ENT   Head: Normocephalic and atraumatic.   Nose: No congestion/rhinnorhea.   Mouth/Throat: Mucous membranes are moist.   Neck: No stridor. Hematological/Lymphatic/Immunilogical: No cervical lymphadenopathy. Cardiovascular: Normal rate, regular rhythm.  No murmurs, rubs, or gallops. Respiratory: Tachypnea. Breath sounds are clear and equal bilaterally. No wheezes/rales/rhonchi. Gastrointestinal: Soft and non tender. No rebound. No guarding.  Genitourinary:  Deferred Musculoskeletal: Normal range of motion in all extremities. No lower extremity edema. Neurologic:  Somnolent. Awakens to painful stimuli and then falls back asleep. Will moan and try to answer when awake.  Skin:  Skin is warm, dry and intact. No rash noted.   ____________________________________________    LABS (pertinent positives/negatives)  Labs Reviewed  GLUCOSE, CAPILLARY - Abnormal; Notable for the following:       Result Value   Glucose-Capillary 153 (*)    All other components within normal limits  COMPREHENSIVE METABOLIC PANEL - Abnormal; Notable for the following:    Glucose, Bld 167 (*)    BUN 37 (*)    Creatinine, Ser 2.08 (*)    Calcium 8.8 (*)    Total Protein 6.4 (*)    Albumin 3.1 (*)    AST 45 (*)    GFR calc non Af Amer 23 (*)    GFR calc Af Amer 26 (*)  All other components within normal limits  CBC WITH DIFFERENTIAL/PLATELET - Abnormal; Notable for the following:    WBC 11.3 (*)    Hemoglobin 11.2 (*)    HCT 33.9 (*)    RDW 16.1 (*)    Neutro Abs 8.6 (*)    Monocytes Absolute 1.1 (*)    All other components within normal limits  URINALYSIS, ROUTINE W REFLEX MICROSCOPIC - Abnormal; Notable for the following:    Color, Urine YELLOW (*)    APPearance HAZY (*)    Glucose, UA >=500 (*)    Hgb urine dipstick SMALL (*)    Protein, ur >=300 (*)    Squamous Epithelial / LPF 0-5 (*)    All other components within normal limits  GLUCOSE, CAPILLARY - Abnormal; Notable for the following:    Glucose-Capillary 154 (*)    All other components within normal limits  CULTURE, BLOOD (ROUTINE X 2)  CULTURE, BLOOD (ROUTINE X 2)  LACTIC ACID, PLASMA  LACTIC ACID, PLASMA     ____________________________________________   EKG  I, Nance Pear, attending physician, personally viewed and interpreted this EKG  EKG Time: 1107 Rate: 75 Rhythm: atrial ventricular dual paced rhythm Axis: right axis deviation Intervals: qtc 483 QRS: wide ST changes:  no st elevation Impression: abnormal ekg   ____________________________________________    RADIOLOGY  CT head IMPRESSION:  1. Stable exam. No acute intracranial abnormality.  2. Atrophy with chronic small vessel white matter ischemic disease.    CXR IMPRESSION: 1. No definite pneumonia. There are prominent right infrahilar markings but similar to prior. 2. Artifact versus pleural based opacity at the left base. 3. Suggest follow-up.   ____________________________________________   PROCEDURES  Procedures  ____________________________________________   INITIAL IMPRESSION / ASSESSMENT AND PLAN / ED COURSE  Pertinent labs & imaging results that were available during my care of the patient were reviewed by me and considered in my medical decision making (see chart for details).  Patient presented to the emergency department today with altered mental status. Patient did have a oral tempe No obvious source of the patient's infection identified. However given continued tachypnea do wonder if there is a pneumonia that the chest x-ray did notidentify. Patient was given multiple broad-spectrum IV antibiotics. Will be admitted to the hospitalist service.  ____________________________________________   FINAL CLINICAL IMPRESSION(S) / ED DIAGNOSES  Final diagnoses:  Altered mental status, unspecified altered mental status type     Note: This dictation was prepared with Dragon dictation. Any transcriptional errors that result from this process are unintentional     Nance Pear, MD 01/05/17 [phone]

## 2017-01-05 NOTE — ED Triage Notes (Signed)
Pt arrives via ems from home, pt's husband called out for patient having an altered level of consciousness, pt wears cont O2 at 2L, pt has prolonged expiration on arrival, ems reports co2 reading for them at 38. Pt will awaken to stimulus

## 2017-01-05 NOTE — Progress Notes (Signed)
Pharmacy Antibiotic Note  Andrea [name] is a 70 y.o. female admitted on 01/05/2017 with sepsis.  Pharmacy has been consulted for aztreonam, levofloxacin, and vancomycin dosing. In ED she has a temperature of 100.1, decreased responsiveness/AMS, tachypnea, and leukocytosis (11.3).   Plan: 1. Aztreonam 1 gm IV Q8H 2. Levofloxacin 750 mg IV Q48H 3. Vancomycin 1 gm IV x 1 in ED followed in approximately 15 hours (stacked dosing) by vancomycin 1.25 gm IV Q36H, predicted trough 17 mcg/mL. Pharmacy will continue to follow and adjust as needed to maintain trough 15 to 20 mcg/mL.   Vd 46.8 L, Ke 0.026 hr-1, T1/2 26.5 hr  Height: 5\' 3"  (160 cm) Weight: 197 lb (89.4 kg) IBW/kg (Calculated) : 52.4  Temp (24hrs), Avg:100.1 F (37.8 C), Min:100.1 F (37.8 C), Max:100.1 F (37.8 C)   Recent Labs Lab 01/05/17 1107 01/05/17 1136  WBC 11.3*  --   CREATININE 2.08*  --   LATICACIDVEN  --  0.9    Estimated Creatinine Clearance: 26.3 mL/min (A) (by C-G formula based on SCr of 2.08 mg/dL (H)).    Allergies  Allergen Reactions  . Watermelon Concentrate Anaphylaxis  . Ace Inhibitors   . Celebrex [Celecoxib] Rash  . Penicillins Rash  . Sulfa Antibiotics Rash   Thank you for allowing pharmacy to be a part of this patient's care.  Laural Benes, Pharm.D., BCPS Clinical Pharmacist 01/05/2017 12:24 PM

## 2017-01-05 NOTE — H&P (Signed)
[facility] [place] NAME: Andrea [name]    MR#:  [id number]  DATE OF BIRTH:  04/09/1946  DATE OF ADMISSION:  01/05/2017  PRIMARY CARE PHYSICIAN: Patient, No Pcp Per   REQUESTING/REFERRING PHYSICIAN: Archie Balboa  CHIEF COMPLAINT:   Altered mental status HISTORY OF PRESENT ILLNESS:  Andrea [name]  is a 71 y.o. female with a known history of congestive heart failure, hypertension, hypothyroidism, insulin requiring diabetes mellitus and other medical problems is brought into the ED for altered mental status by her husband. Patient was found to be unresponsive at 9:30 AM today and patient was in her usual state of health until last night.Husband is at bedside and providing history , he reports patient did not have any sick contacts and recent traveling. CT head is normal. White count is elevated and patient has low-grade fever. Chest x-ray with the left base opacity We'll keep her nothing by mouth for possible aspiration and in view of encephalopathy  PAST MEDICAL HISTORY:   Past Medical History:  Diagnosis Date  . CHF (congestive heart failure) ([place])   . Chronic back pain    Followed by pain clinic in [place]  . Endometriosis   . HTN (hypertension)   . Hypothyroidism   . IDDM (insulin dependent diabetes mellitus) ([place])   . NASH (nonalcoholic steatohepatitis)    has had liver biopsy in past  . Obesity   . PUD (peptic ulcer disease) 1990's    PAST SURGICAL HISTOIRY:   Past Surgical History:  Procedure Laterality Date  . CHOLECYSTECTOMY    . TOTAL ABDOMINAL HYSTERECTOMY W/ BILATERAL SALPINGOOPHORECTOMY      SOCIAL HISTORY:   Social History  Substance Use Topics  . Smoking status: Former Research scientist (life sciences)  . Smokeless tobacco: Never Used     Comment: Quit 1985  . Alcohol use No    FAMILY HISTORY:   Family History  Problem Relation Age of Onset  . COPD Sister   . Cancer Brother   . Emphysema Mother   . Heart disease Father      DRUG ALLERGIES:   Allergies  Allergen Reactions  . Watermelon Concentrate Anaphylaxis  . Ace Inhibitors   . Celebrex [Celecoxib] Rash  . Penicillins Rash  . Sulfa Antibiotics Rash    REVIEW OF SYSTEMS:  Review of systems unobtainable as the patient is  lethargic  MEDICATIONS AT HOME:   Prior to Admission medications   Medication Sig Start Date End Date Taking? Authorizing Provider  acetaminophen (TYLENOL) 325 MG tablet Take 650 mg by mouth 3 (three) times daily as needed.      [provider]  amLODipine (NORVASC) 10 MG tablet TAKE ONE TABLET BY MOUTH ONCE DAILY *NEEDS APPOINTMENT FOR FURTHER REFILLS* 02/23/14   Jackolyn Confer, MD  aspirin 81 MG tablet Take 81 mg by mouth daily.    [provider]  atorvastatin (LIPITOR) 20 MG tablet TAKE ONE TABLET BY MOUTH ONCE DAILY 12/10/13   Jackolyn Confer, MD  carvedilol (COREG) 6.25 MG tablet Take 6.25 mg by mouth 2 (two) times daily with a meal.    [provider]  Cholecalciferol (VITAMIN D HIGH POTENCY PO) Take 1 capsule by mouth daily.      [provider]  clopidogrel (PLAVIX) 75 MG tablet Take 1 tablet (75 mg total) by mouth daily. 12/29/12   Jackolyn Confer, MD  docusate sodium (COLACE) 100 MG capsule Take 100 mg by mouth 2 (two) times daily.  [provider]  furosemide (LASIX) 40 MG tablet Take 80 mg by mouth daily.    [provider]  gabapentin (NEURONTIN) 800 MG tablet Take 1 tablet (800 mg total) by mouth 2 (two) times daily. Patient taking differently: Take 600 mg by mouth 2 (two) times daily.  03/06/13   Jackolyn Confer, MD  insulin aspart (NOVOLOG) 100 UNIT/ML injection Inject 3-15 Units into the skin 3 (three) times daily before meals. Sliding scale 10/29/12   Jackolyn Confer, MD  Insulin Glargine (TOUJEO SOLOSTAR) 300 UNIT/ML SOPN Inject 60 Units into the skin daily. 06/19/16   Fritzi Mandes, MD  levothyroxine (SYNTHROID, LEVOTHROID) 150 MCG tablet TAKE ONE  TABLET BY MOUTH EVERY DAY Patient taking differently: 175 mcg. TAKE ONE TABLET BY MOUTH EVERY DAY 10/17/12   Jackolyn Confer, MD  lidocaine (LIDODERM) 5 % Place 1 patch onto the skin daily. Remove & Discard patch within 12 hours or as directed by MD 11/21/12   Jackolyn Confer, MD  losartan (COZAAR) 50 MG tablet Take 1 tablet (50 mg total) by mouth daily. Patient taking differently: Take 100 mg by mouth daily.  11/28/12 06/17/16  Jackolyn Confer, MD  Multiple Vitamin (MULTIVITAMIN) tablet Take 1 tablet by mouth daily.    [provider]  PARoxetine (PAXIL) 20 MG tablet TAKE ONE TABLET BY MOUTH IN THE MORNING 10/17/12   Jackolyn Confer, MD  polyethylene glycol powder (GLYCOLAX/MIRALAX) powder Take 17 g by mouth 3 (three) times daily as needed. For constipation     [provider]  vitamin B-12 (CYANOCOBALAMIN) 1000 MCG tablet Take 1,000 mcg by mouth daily.    [provider]      VITAL SIGNS:  Blood pressure (!) 175/147, pulse 70, temperature 100.1 F (37.8 C), temperature source Oral, resp. rate (!) 22, height 5\' 3"  (1.6 m), weight 89.4 kg (197 lb), SpO2 98 %.  PHYSICAL EXAMINATION:  GENERAL:  71 y.o.-year-old patient lying in the bed with no acute distress. Currently encephalopathic EYES: Pupils equal, round, reactive to light and accommodation. No scleral icterus.  HEENT: Head atraumatic, normocephalic. Oropharynx and nasopharynx clear.  NECK:  Supple, no jugular venous distention. No thyroid enlargement, no tenderness.  LUNGS: diminished breath sounds bilaterally, no wheezing, rales,rhonchi . Positivecrepitation at left lung base. No use of accessory muscles of respiration.  CARDIOVASCULAR: S1, S2 normal. No murmurs, rubs, or gallops.  ABDOMEN: Soft, nontender, nondistended. Bowel sounds present. No organomegaly or mass.  EXTREMITIES: No pedal edema, cyanosis, or clubbing.  NEUROLOGIC: patient is encephalopathic butarousable PSYCHIATRIC: The patient is  arousable and opens eyes to verbal commands but not answering questionsSKIN: No obvious rash, lesion, or ulcer.   LABORATORY PANEL:   CBC  Recent Labs Lab 01/05/17 1107  WBC 11.3*  HGB 11.2*  HCT 33.9*  PLT 156   ------------------------------------------------------------------------------------------------------------------  Chemistries   Recent Labs Lab 01/05/17 1107  NA 137  K 5.1  CL 108  CO2 23  GLUCOSE 167*  BUN 37*  CREATININE 2.08*  CALCIUM 8.8*  AST 45*  ALT 22  ALKPHOS 107  BILITOT 0.4   ------------------------------------------------------------------------------------------------------------------  Cardiac Enzymes No results for input(s): TROPONINI in the last 168 hours. ------------------------------------------------------------------------------------------------------------------  RADIOLOGY:  Ct Head Wo Contrast  Result Date: 01/05/2017 CLINICAL DATA:  Mental status changes. EXAM: CT HEAD WITHOUT CONTRAST TECHNIQUE: Contiguous axial images were obtained from the base of the skull through the vertex without intravenous contrast. COMPARISON:  02/15/2014 FINDINGS: Brain: There is no evidence for  acute hemorrhage, hydrocephalus, mass lesion, or abnormal extra-axial fluid collection. No definite CT evidence for acute infarction. Diffuse loss of parenchymal volume is consistent with atrophy. Patchy low attenuation in the deep hemispheric and periventricular white matter is nonspecific, but likely reflects chronic microvascular ischemic demyelination. Vascular: No hyperdense vessel or unexpected calcification. Skull: No evidence for fracture. No worrisome lytic or sclerotic lesion. Sinuses/Orbits: The visualized paranasal sinuses and mastoid air cells are clear. Visualized portions of the globes and intraorbital fat are unremarkable. Other: None. IMPRESSION: 1. Stable exam.  No acute intracranial abnormality. 2. Atrophy with chronic small vessel white matter  ischemic disease. Electronically Signed   By: Misty Stanley M.D.   On: 01/05/2017 13:54   Dg Chest Port 1 View  Result Date: 01/05/2017 CLINICAL DATA:  Severe sepsis with shock EXAM: PORTABLE CHEST 1 VIEW COMPARISON:  06/18/2016 FINDINGS: Chronic cardiopericardial enlargement. Dual-chamber pacer leads from the left are new from prior, positioned over the right ventricle and right atrium. Hazy peripheral density at the left cardiac apex which could be partially artifactual. Prominent right infrahilar markings but similar to prior. IMPRESSION: 1. No definite pneumonia. There are prominent right infrahilar markings but similar to prior. 2. Artifact versus pleural based opacity at the left base. 3. Suggest follow-up. Electronically Signed   By: Monte Fantasia M.D.   On: 01/05/2017 13:42    EKG:   Orders placed or performed during the hospital encounter of 01/05/17  . EKG 12-Lead  . EKG 12-Lead  . ED EKG 12-Lead  . ED EKG 12-Lead  . EKG 12-Lead  . EKG 12-Lead    IMPRESSION AND PLAN:   Andrea [name]  is a 71 y.o. female with a known history of congestive heart failure, hypertension, hypothyroidism, insulin requiring diabetes mellitus and other medical problems is brought into the ED for altered mental status by her husband. Patient was found to be unresponsive at 9:30 AM today and patient was in her usual state of health until last night.Husband is at bedside and providing history   #acute encephalopathy probably from clinical pneumonia /cannot rule out sepsis Admit to MedSurg unit Pancultures were ordered Chest x-ray with possible left base opacity Broad-spectrum IV antibiotics with aztreonam and vancomycin Neurovascular checks Hydrate with IV fluids with close monitoring for symptoms and signs of fluid overload and history of CHF Urine drug screen EEG-as patient has some jerky movements / chills CT head is negative lactic acid is nml check pro calcitonin  ##insulin requiring diabetes  mellitus Patient is currently nothing by mouth for encephalopathy Will continue Lantus 15 units for basal coverage patient usually takes 60 units at home Sliding scale insulin  #Hypothyroidism Check TSH and provide IV levothyroxine 87.5 g IV while patient is nothing by mouth  #Hypertension blood pressure is elevated Currently she is nothing by mouth Will provide IV Lopressor and hydralazine as needed  DVT prophylaxis with Lovenox subcutaneous   All the records are reviewed and case discussed with ED provider. Management plans discussed with the patient, family and they are in agreement.  CODE STATUS: full code, husband is the healthcare power of attorney  TOTAL TIME TAKING CARE OF THIS PATIENT: 46 minutes.   Note: This dictation was prepared with Dragon dictation along with smaller phrase technology. Any transcriptional errors that result from this process are unintentional.  Nicholes Mango M.D on 01/05/2017 at 3:51 PM  Between 7am to 6pm - Pager - [phone]  After 6pm go to [email or web address] - password [email or web address] [facility]  [facility]  Office  [phone]  CC: Primary care physician; Patient, No Pcp Per

## 2017-01-05 NOTE — Progress Notes (Signed)
Dr Margaretmary Eddy made aware of troponin 0.07, cardiology consult placed

## 2017-01-05 NOTE — ED Notes (Signed)
VS validated in error.

## 2017-01-05 NOTE — ED Notes (Signed)
Patient appears more alert at this time.

## 2017-01-06 ENCOUNTER — Inpatient Hospital Stay: Payer: Medicare HMO

## 2017-01-06 ENCOUNTER — Encounter: Payer: Self-pay | Admitting: Internal Medicine

## 2017-01-06 DIAGNOSIS — R531 Weakness: Secondary | ICD-10-CM

## 2017-01-06 DIAGNOSIS — R4182 Altered mental status, unspecified: Secondary | ICD-10-CM

## 2017-01-06 DIAGNOSIS — M25552 Pain in left hip: Secondary | ICD-10-CM

## 2017-01-06 LAB — COMPREHENSIVE METABOLIC PANEL
ALBUMIN: 2.5 g/dL — AB (ref 3.5–5.0)
ALT: 19 U/L (ref 14–54)
ANION GAP: 6 (ref 5–15)
AST: 27 U/L (ref 15–41)
Alkaline Phosphatase: 101 U/L (ref 38–126)
BUN: 42 mg/dL — ABNORMAL HIGH (ref 6–20)
CHLORIDE: 110 mmol/L (ref 101–111)
CO2: 22 mmol/L (ref 22–32)
Calcium: 8.3 mg/dL — ABNORMAL LOW (ref 8.9–10.3)
Creatinine, Ser: 2.5 mg/dL — ABNORMAL HIGH (ref 0.44–1.00)
GFR calc non Af Amer: 18 mL/min — ABNORMAL LOW (ref >60)
GFR, EST AFRICAN AMERICAN: 21 mL/min — AB (ref >60)
GLUCOSE: 109 mg/dL — AB (ref 65–99)
Potassium: 5.5 mmol/L — ABNORMAL HIGH (ref 3.5–5.1)
SODIUM: 138 mmol/L (ref 135–145)
Total Bilirubin: 0.5 mg/dL (ref 0.3–1.2)
Total Protein: 5.6 g/dL — ABNORMAL LOW (ref 6.5–8.1)

## 2017-01-06 LAB — TROPONIN I: Troponin I: 0.06 ng/mL (ref <0.03)

## 2017-01-06 LAB — GLUCOSE, CAPILLARY
GLUCOSE-CAPILLARY: 124 mg/dL — AB (ref 65–99)
Glucose-Capillary: 100 mg/dL — ABNORMAL HIGH (ref 65–99)
Glucose-Capillary: 84 mg/dL (ref 65–99)
Glucose-Capillary: 90 mg/dL (ref 65–99)
Glucose-Capillary: 155 mg/dL — ABNORMAL HIGH (ref 65–99)

## 2017-01-06 LAB — CBC
HCT: 30.4 % — ABNORMAL LOW (ref 35.0–47.0)
HEMOGLOBIN: 10.1 g/dL — AB (ref 12.0–16.0)
MCH: 28.4 pg (ref 26.0–34.0)
MCHC: 33.4 g/dL (ref 32.0–36.0)
MCV: 85.1 fL (ref 80.0–100.0)
Platelets: 121 10*3/uL — ABNORMAL LOW (ref 150–440)
RBC: 3.57 MIL/uL — AB (ref 3.80–5.20)
RDW: 16.6 % — ABNORMAL HIGH (ref 11.5–14.5)
WBC: 7.8 10*3/uL (ref 3.6–11.0)

## 2017-01-06 LAB — TSH: TSH: 0.369 u[IU]/mL (ref 0.350–4.500)

## 2017-01-06 MED ORDER — LEVOFLOXACIN IN D5W 500 MG/100ML IV SOLN
500.0000 mg | INTRAVENOUS | Status: DC
  Administered 2017-01-07: 500 mg via INTRAVENOUS
  Filled 2017-01-06: qty 100

## 2017-01-06 MED ORDER — ORAL CARE MOUTH RINSE
15.0000 mL | Freq: Two times a day (BID) | OROMUCOSAL | Status: DC
  Administered 2017-01-06 – 2017-01-11 (×7): 15 mL via OROMUCOSAL

## 2017-01-06 MED ORDER — DEXTROSE 5 % IV SOLN
500.0000 mg | Freq: Three times a day (TID) | INTRAVENOUS | Status: DC
  Administered 2017-01-06 – 2017-01-07 (×2): 500 mg via INTRAVENOUS
  Filled 2017-01-06 (×4): qty 0.5

## 2017-01-06 MED ORDER — HEPARIN SODIUM (PORCINE) 5000 UNIT/ML IJ SOLN
5000.0000 [IU] | Freq: Three times a day (TID) | INTRAMUSCULAR | Status: DC
  Administered 2017-01-06 – 2017-01-07 (×3): 5000 [IU] via SUBCUTANEOUS
  Filled 2017-01-06 (×3): qty 1

## 2017-01-06 NOTE — Consult Note (Signed)
Reason for Consult: L sided weakness  Referring Physician: Dr. Darvin Neighbours   CC: L sided weakness   HPI: Gwenn Teodoro Grand is an 71 y.o. female  with a known history of congestive heart failure, hypertension, hypothyroidism, insulin requiring diabetes mellitus brought into the ED for altered mental status by her husband. Patient was found to be unresponsive at 9:30 AM today and patient was in her usual state of health until last night. Today pt appears to have much improved.  Neurology consulted for possible L sided weakness   Past Medical History:  Diagnosis Date  . CHF (congestive heart failure) ([place])   . Chronic back pain    Followed by pain clinic in [place]  . Endometriosis   . HTN (hypertension)   . Hypothyroidism   . IDDM (insulin dependent diabetes mellitus) ([place])   . Mild dementia   . NASH (nonalcoholic steatohepatitis)    has had liver biopsy in past  . Obesity   . PUD (peptic ulcer disease) 1990's    Past Surgical History:  Procedure Laterality Date  . CHOLECYSTECTOMY    . TOTAL ABDOMINAL HYSTERECTOMY W/ BILATERAL SALPINGOOPHORECTOMY      Family History  Problem Relation Age of Onset  . COPD Sister   . Cancer Brother   . Emphysema Mother   . Heart disease Father     Social History:  reports that she has quit smoking. She has never used smokeless tobacco. She reports that she does not drink alcohol or use drugs.  Allergies  Allergen Reactions  . Watermelon Concentrate Anaphylaxis  . Ace Inhibitors   . Celebrex [Celecoxib] Rash  . Penicillins Rash  . Sulfa Antibiotics Rash    Medications: I have reviewed the patient's current medications.  ROS: History obtained from the patient  General ROS: negative for - chills, fatigue, fever, night sweats, weight gain or weight loss Psychological ROS: negative for - behavioral disorder, hallucinations, memory difficulties, mood swings or suicidal ideation Ophthalmic ROS: negative for - blurry vision, double vision, eye  pain or loss of vision ENT ROS: negative for - epistaxis, nasal discharge, oral lesions, sore throat, tinnitus or vertigo Allergy and Immunology ROS: negative for - hives or itchy/watery eyes Hematological and Lymphatic ROS: negative for - bleeding problems, bruising or swollen lymph nodes Endocrine ROS: negative for - galactorrhea, hair pattern changes, polydipsia/polyuria or temperature intolerance Respiratory ROS: negative for - cough, hemoptysis, shortness of breath or wheezing Cardiovascular ROS: negative for - chest pain, dyspnea on exertion, edema or irregular heartbeat Gastrointestinal ROS: negative for - abdominal pain, diarrhea, hematemesis, nausea/vomiting or stool incontinence Genito-Urinary ROS: negative for - dysuria, hematuria, incontinence or urinary frequency/urgency Musculoskeletal ROS: negative for - joint swelling or muscular weakness Neurological ROS: as noted in HPI Dermatological ROS: negative for rash and skin lesion changes  Physical Examination: Blood pressure (!) 157/55, pulse 70, temperature 98.6 F (37 C), temperature source Oral, resp. rate (!) 23, height 5\' 3"  (1.6 m), weight 89.4 kg (197 lb), SpO2 96 %.   Neurological Examination   Mental Status: Alert, oriented, thought content appropriate.  Speech fluent without evidence of aphasia.  Able to follow 3 step commands without difficulty. Cranial Nerves: II: Discs flat bilaterally; Visual fields grossly normal, pupils equal, round, reactive to light and accommodation III,IV, VI: ptosis not present, extra-ocular motions intact bilaterally V,VII: smile symmetric, facial light touch sensation normal bilaterally VIII: hearing normal bilaterally IX,X: gag reflex present XI: bilateral shoulder shrug XII: midline tongue extension Motor: Right : Upper  extremity   4+/5    Left:     Upper extremity   4+/5  Lower extremity   4/5     Lower extremity   4/5 Tone and bulk:normal tone throughout; no atrophy  noted Sensory: Pinprick and light touch intact throughout, bilaterally Deep Tendon Reflexes: 1+ and symmetric throughout Plantars: Right: downgoing   Left: downgoing Cerebellar: normal finger-to-nose, normal rapid alternating movements and normal heel-to-shin test Gait: not tested      Laboratory Studies:   Basic Metabolic Panel:  Recent Labs Lab 01/05/17 1107 01/06/17 0419  NA 137 138  K 5.1 5.5*  CL 108 110  CO2 23 22  GLUCOSE 167* 109*  BUN 37* 42*  CREATININE 2.08* 2.50*  CALCIUM 8.8* 8.3*    Liver Function Tests:  Recent Labs Lab 01/05/17 1107 01/06/17 0419  AST 45* 27  ALT 22 19  ALKPHOS 107 101  BILITOT 0.4 0.5  PROT 6.4* 5.6*  ALBUMIN 3.1* 2.5*   No results for input(s): LIPASE, AMYLASE in the last 168 hours. No results for input(s): AMMONIA in the last 168 hours.  CBC:  Recent Labs Lab 01/05/17 1107 01/06/17 0419  WBC 11.3* 7.8  NEUTROABS 8.6*  --   HGB 11.2* 10.1*  HCT 33.9* 30.4*  MCV 82.8 85.1  PLT 156 121*    Cardiac Enzymes:  Recent Labs Lab 01/05/17 1709 01/05/17 2214 01/06/17 0419  TROPONINI 0.07* 0.06* 0.06*    BNP: Invalid input(s): POCBNP  CBG:  Recent Labs Lab 01/05/17 1116 01/05/17 1229 01/05/17 1708 01/05/17 2143 01/06/17 0802  GLUCAP 153* 154* 148* 124* 100*    Microbiology: Results for orders placed or performed in visit on 02/16/14  Stool culture     Status: None   Collection Time: 02/17/14 12:32 PM  Result Value Ref Range Status   Micro Text Report   Final       COMMENT                   NO SALMONELLA OR SHIGELLA ISOLATED   COMMENT                   NO PATHOGENIC E.COLI DETECTED   COMMENT                   NO CAMPYLOBACTER ANTIGEN DETECTED   ANTIBIOTIC                                                      Clostridium Difficile [facility])     Status: None   Collection Time: 02/17/14 12:32 PM  Result Value Ref Range Status   Micro Text Report   Final       C.DIFFICILE ANTIGEN       C.DIFFICILE GDH  ANTIGEN : NEGATIVE   C.DIFFICILE TOXIN A/B     C.DIFFICILE TOXINS A AND B : NEGATIVE   INTERPRETATION            Negative for C. difficile.    ANTIBIOTIC                                                        Coagulation  Studies: No results for input(s): LABPROT, INR in the last 72 hours.  Urinalysis:  Recent Labs Lab 01/05/17 1135  COLORURINE YELLOW*  LABSPEC 1.017  PHURINE 5.0  GLUCOSEU >=500*  HGBUR SMALL*  BILIRUBINUR NEGATIVE  KETONESUR NEGATIVE  PROTEINUR >=300*  NITRITE NEGATIVE  LEUKOCYTESUR NEGATIVE    Lipid Panel:     Component Value Date/Time   CHOL 161 03/06/2013 1457   CHOL 240 (H) 11/14/2011 0326   TRIG 407.0 (H) 03/06/2013 1457   TRIG 319 (H) 11/14/2011 0326   HDL 35.10 (L) 03/06/2013 1457   HDL 40 11/14/2011 0326   CHOLHDL 5 03/06/2013 1457   VLDL 81.4 (H) 03/06/2013 1457   VLDL 64 (H) 11/14/2011 0326   LDLCALC 136 (H) 11/14/2011 0326    HgbA1C:  Lab Results  Component Value Date   HGBA1C 11.0 (H) 03/06/2013    Urine Drug Screen:     Component Value Date/Time   LABOPIA NONE DETECTED 01/05/2017 1135   LABOPIA NEG 04/11/2011 1548   COCAINSCRNUR NONE DETECTED 01/05/2017 1135   COCAINSCRNUR NEG 04/11/2011 1548   LABBENZ NONE DETECTED 01/05/2017 1135   LABBENZ NEG 04/11/2011 1548   AMPHETMU NONE DETECTED 01/05/2017 1135   THCU NONE DETECTED 01/05/2017 1135   LABBARB NONE DETECTED 01/05/2017 1135    Alcohol Level: No results for input(s): ETH in the last 168 hours.  Other results: EKG: normal EKG, normal sinus rhythm, unchanged from previous tracings.  Imaging: Ct Head Wo Contrast  Result Date: 01/05/2017 CLINICAL DATA:  Mental status changes. EXAM: CT HEAD WITHOUT CONTRAST TECHNIQUE: Contiguous axial images were obtained from the base of the skull through the vertex without intravenous contrast. COMPARISON:  02/15/2014 FINDINGS: Brain: There is no evidence for acute hemorrhage, hydrocephalus, mass lesion, or abnormal extra-axial fluid  collection. No definite CT evidence for acute infarction. Diffuse loss of parenchymal volume is consistent with atrophy. Patchy low attenuation in the deep hemispheric and periventricular white matter is nonspecific, but likely reflects chronic microvascular ischemic demyelination. Vascular: No hyperdense vessel or unexpected calcification. Skull: No evidence for fracture. No worrisome lytic or sclerotic lesion. Sinuses/Orbits: The visualized paranasal sinuses and mastoid air cells are clear. Visualized portions of the globes and intraorbital fat are unremarkable. Other: None. IMPRESSION: 1. Stable exam.  No acute intracranial abnormality. 2. Atrophy with chronic small vessel white matter ischemic disease. Electronically Signed   By: Misty Stanley M.D.   On: 01/05/2017 13:54   Dg Chest Port 1 View  Result Date: 01/05/2017 CLINICAL DATA:  Severe sepsis with shock EXAM: PORTABLE CHEST 1 VIEW COMPARISON:  06/18/2016 FINDINGS: Chronic cardiopericardial enlargement. Dual-chamber pacer leads from the left are new from prior, positioned over the right ventricle and right atrium. Hazy peripheral density at the left cardiac apex which could be partially artifactual. Prominent right infrahilar markings but similar to prior. IMPRESSION: 1. No definite pneumonia. There are prominent right infrahilar markings but similar to prior. 2. Artifact versus pleural based opacity at the left base. 3. Suggest follow-up. Electronically Signed   By: Monte Fantasia M.D.   On: 01/05/2017 13:42     Assessment/Plan:  71 y.o. female  with a known history of congestive heart failure, hypertension, hypothyroidism, insulin requiring diabetes mellitus brought into the ED for altered mental status by her husband. Patient was found to be unresponsive at 9:30 AM today and patient was in her usual state of health until last night. Today pt appears to have much improved.  Neurology consulted for possible L sided weakness   -  Pt has as PPM  and unable to get MRI - Initial CTH no acute abnormality - Pt has generalized weakness and complaining L hip pain when raising her L leg.   - would place ASA 81mg  daily and as she continues to improved if symptoms don't resolve would obtain CTH repeat and CTA if renal function improves. Otherwise carotid US - X ray L hip Kajal Scalici  01/06/2017, 11:33 AM

## 2017-01-06 NOTE — Progress Notes (Signed)
In/out cath completed with 347ml of dark cloudy concentrated urine drained. Pt reports relief.

## 2017-01-06 NOTE — Progress Notes (Addendum)
Dr. Jannifer Franklin notified bladder scan shows 401 ml. Pt up to [facility] and unable to void. Order obtained for in/out cath once now.

## 2017-01-06 NOTE — Progress Notes (Signed)
Notified Dr Darvin Neighbours that pt did not void since 0254 when In&out cath was done. Bladder scan results 25ml. Per MD to In&out cath for bladder scan result>577ml.

## 2017-01-06 NOTE — Plan of Care (Signed)
Problem: Safety: Goal: Ability to remain free from injury will improve Outcome: Progressing Pt is high fall risk with yellow arm band and socks on. Bed alarm is also on. Husband at bedside.

## 2017-01-06 NOTE — Consult Note (Signed)
[facility] Consultation Note  Patient ID: Andrea [name], MRN: [id number], DOB/AGE: 1946/01/24 71 y.o. Admit date: 01/05/2017   Date of Consult: 01/06/2017 Primary Physician: Patient, No Pcp Per Primary Cardiologist: [facility]  Chief Complaint:  Chief Complaint  Patient presents with  . Loss of Consciousness   Reason for Consult: mental status changes with possible infection and chronic diastolic dysfunction heart failure  HPI: 71 y.o. female with known chronic diastolic dysfunction congestive heart failure multifactorial in nature including chronic kidney disease stage IV essential hypertension mixed hyperlipidemia and history of atrial fibrillation status post heart block with pacemaker placement. The patient has had an ejection fraction of 58% with a recent echocardiogram and appropriate medication management for cardiovascular risk reduction including beta blocker. The patient has had a pacemaker placed in the past for heart block for which she has not had any evidence of syncope or other symptoms. Recently she has had what appears to be an infection and mental status changes due to this infection and diabetes. She is not able to give much history at this time but currently has no evidence of myocardial infarction with a troponin of 0.07 consistent with demand ischemia and current infection as well as no evidence of congestive heart failure.  Past Medical History:  Diagnosis Date  . CHF (congestive heart failure) ([place])   . Chronic back pain    Followed by pain clinic in [place]  . Endometriosis   . HTN (hypertension)   . Hypothyroidism   . IDDM (insulin dependent diabetes mellitus) ([place])   . NASH (nonalcoholic steatohepatitis)    has had liver biopsy in past  . Obesity   . PUD (peptic ulcer disease) 1990's      Surgical History:  Past Surgical History:  Procedure Laterality Date  . CHOLECYSTECTOMY    . TOTAL ABDOMINAL HYSTERECTOMY W/ BILATERAL SALPINGOOPHORECTOMY        Home Meds: Prior to Admission medications   Medication Sig Start Date End Date Taking? Authorizing Provider  acetaminophen (TYLENOL) 325 MG tablet Take 650 mg by mouth 3 (three) times daily as needed.     Yes [provider]  amLODipine (NORVASC) 10 MG tablet TAKE ONE TABLET BY MOUTH ONCE DAILY *NEEDS APPOINTMENT FOR FURTHER REFILLS* 02/23/14  Yes Jackolyn Confer, MD  aspirin 81 MG tablet Take 81 mg by mouth daily.   Yes [provider]  atorvastatin (LIPITOR) 20 MG tablet TAKE ONE TABLET BY MOUTH ONCE DAILY 12/10/13  Yes Jackolyn Confer, MD  carvedilol (COREG) 12.5 MG tablet Take 1 tablet by mouth 2 (two) times daily with a meal.  11/30/16  Yes [provider]  Cholecalciferol (VITAMIN D HIGH POTENCY PO) Take 1 capsule by mouth daily.     Yes [provider]  clopidogrel (PLAVIX) 75 MG tablet Take 1 tablet (75 mg total) by mouth daily. 12/29/12  Yes Jackolyn Confer, MD  ELIQUIS 5 MG TABS tablet Take 1 tablet by mouth 2 (two) times daily. 12/28/16  Yes [provider]  furosemide (LASIX) 40 MG tablet Take 40 mg by mouth daily.    Yes [provider]  gabapentin (NEURONTIN) 600 MG tablet Take 1 tablet by mouth 2 (two) times daily. 10/18/16  Yes [provider]  insulin aspart (NOVOLOG) 100 UNIT/ML injection Inject 3-15 Units into the skin 3 (three) times daily before meals. Sliding scale 10/29/12  Yes Jackolyn Confer, MD  Insulin Glargine (TOUJEO SOLOSTAR) 300 UNIT/ML SOPN Inject 60 Units into  the skin daily. Patient taking differently: Inject 65 Units into the skin daily.  06/19/16  Yes Fritzi Mandes, MD  levothyroxine (SYNTHROID, LEVOTHROID) 175 MCG tablet Take 1 tablet by mouth daily. 12/18/16  Yes [provider]  PARoxetine (PAXIL) 40 MG tablet Take 1 tablet by mouth daily. 12/13/16  Yes [provider]  vitamin B-12 (CYANOCOBALAMIN) 1000 MCG tablet Take 5,000 mcg by mouth daily.    Yes [provider]   losartan (COZAAR) 50 MG tablet Take 1 tablet (50 mg total) by mouth daily. 11/28/12 06/17/16  Jackolyn Confer, MD    Inpatient Medications:  . enoxaparin (LOVENOX) injection  30 mg Subcutaneous Q24H  . insulin aspart  0-5 Units Subcutaneous QHS  . insulin aspart  0-9 Units Subcutaneous TID WC  . insulin glargine  15 Units Subcutaneous Daily  . levothyroxine  88 mcg Intravenous QAC breakfast   . sodium chloride 75 mL/hr at 01/06/17 0555  . aztreonam Stopped (01/06/17 0615)  . [START ON 01/07/2017] levofloxacin (LEVAQUIN) IV    . vancomycin Stopped (01/06/17 0525)    Allergies:  Allergies  Allergen Reactions  . Watermelon Concentrate Anaphylaxis  . Ace Inhibitors   . Celebrex [Celecoxib] Rash  . Penicillins Rash  . Sulfa Antibiotics Rash    Social History   Social History  . Marital status: Married    Spouse name: N/A  . Number of children: N/A  . Years of education: N/A   Occupational History  . retired    Social History Main Topics  . Smoking status: Former Research scientist (life sciences)  . Smokeless tobacco: Never Used     Comment: Quit 1985  . Alcohol use No  . Drug use: No  . Sexual activity: Not on file   Other Topics Concern  . Not on file   Social History Narrative   Lives with husband     Family History  Problem Relation Age of Onset  . COPD Sister   . Cancer Brother   . Emphysema Mother   . Heart disease Father      Review of Systems Positive for Mental status changes Negative for: General:  chills, fever, night sweats or weight changes.  Cardiovascular: PND orthopnea syncope dizziness  Dermatological skin lesions rashes Respiratory: Cough congestion Urologic: Frequent urination urination at night and hematuria Abdominal: negative for nausea, vomiting, diarrhea, bright red blood per rectum, melena, or hematemesis Neurologic: negative for visual changes, and/or hearing changes  All other systems reviewed and are otherwise negative except as noted  above.  Labs:  Recent Labs  01/05/17 1709 01/05/17 2214 01/06/17 0419  TROPONINI 0.07* 0.06* 0.06*   Lab Results  Component Value Date   WBC 7.8 01/06/2017   HGB 10.1 (L) 01/06/2017   HCT 30.4 (L) 01/06/2017   MCV 85.1 01/06/2017   PLT 121 (L) 01/06/2017    Recent Labs Lab 01/06/17 0419  NA 138  K 5.5*  CL 110  CO2 22  BUN 42*  CREATININE 2.50*  CALCIUM 8.3*  PROT 5.6*  BILITOT 0.5  ALKPHOS 101  ALT 19  AST 27  GLUCOSE 109*   Lab Results  Component Value Date   CHOL 161 03/06/2013   HDL 35.10 (L) 03/06/2013   LDLCALC 136 (H) 11/14/2011   TRIG 407.0 (H) 03/06/2013   No results found for: DDIMER  Radiology/Studies:  Ct Head Wo Contrast  Result Date: 01/05/2017 CLINICAL DATA:  Mental status changes. EXAM: CT HEAD WITHOUT CONTRAST TECHNIQUE: Contiguous axial images were obtained from  the base of the skull through the vertex without intravenous contrast. COMPARISON:  02/15/2014 FINDINGS: Brain: There is no evidence for acute hemorrhage, hydrocephalus, mass lesion, or abnormal extra-axial fluid collection. No definite CT evidence for acute infarction. Diffuse loss of parenchymal volume is consistent with atrophy. Patchy low attenuation in the deep hemispheric and periventricular white matter is nonspecific, but likely reflects chronic microvascular ischemic demyelination. Vascular: No hyperdense vessel or unexpected calcification. Skull: No evidence for fracture. No worrisome lytic or sclerotic lesion. Sinuses/Orbits: The visualized paranasal sinuses and mastoid air cells are clear. Visualized portions of the globes and intraorbital fat are unremarkable. Other: None. IMPRESSION: 1. Stable exam.  No acute intracranial abnormality. 2. Atrophy with chronic small vessel white matter ischemic disease. Electronically Signed   By: Misty Stanley M.D.   On: 01/05/2017 13:54   Dg Chest Port 1 View  Result Date: 01/05/2017 CLINICAL DATA:  Severe sepsis with shock EXAM: PORTABLE  CHEST 1 VIEW COMPARISON:  06/18/2016 FINDINGS: Chronic cardiopericardial enlargement. Dual-chamber pacer leads from the left are new from prior, positioned over the right ventricle and right atrium. Hazy peripheral density at the left cardiac apex which could be partially artifactual. Prominent right infrahilar markings but similar to prior. IMPRESSION: 1. No definite pneumonia. There are prominent right infrahilar markings but similar to prior. 2. Artifact versus pleural based opacity at the left base. 3. Suggest follow-up. Electronically Signed   By: Monte Fantasia M.D.   On: 01/05/2017 13:42    EKG: Paced rhythm  Weights: Filed Weights   01/05/17 1112  Weight: 89.4 kg (197 lb)     Physical Exam: Blood pressure (!) 157/55, pulse 70, temperature 98.6 F (37 C), temperature source Oral, resp. rate (!) 23, height 5\' 3"  (1.6 m), weight 89.4 kg (197 lb), SpO2 96 %. Body mass index is 34.9 kg/m. General: Well developed, well nourished, in no acute distress. Head eyes ears nose throat: Normocephalic, atraumatic, sclera non-icteric, no xanthomas, nares are without discharge. No apparent thyromegaly and/or mass  Lungs: Normal respiratory effort.  no wheezes, no rales, no rhonchi.  Heart: RRR with normal S1 S2. no murmur gallop, no rub, PMI is normal size and placement, carotid upstroke normal without bruit, jugular venous pressure is normal Abdomen: Soft, non-tender, non-distended with normoactive bowel sounds. No hepatomegaly. No rebound/guarding. No obvious abdominal masses. Abdominal aorta is normal size without bruit Extremities: Trace edema. no cyanosis, no clubbing, no ulcers  Peripheral : 2+ bilateral upper extremity pulses, 2+ bilateral femoral pulses, 2+ bilateral dorsal pedal pulse Neuro: Alert and not oriented. No facial asymmetry. No focal deficit. Moves all extremities spontaneously. Musculoskeletal: Normal muscle tone without kyphosis Psych:  Does not Responds to questions  appropriately with a normal affect.    Assessment: 71 year old female with chronic diastolic dysfunction heart failure chronic kidney disease stage IV essential hypertension diabetes with complication heart block status post previous pacemaker placement and stable from the cardiovascular standpoint without evidence of myocardial infarction or congestive heart failure having mental status changes most consistent with infection and diabetes  Plan: 1. Continue supportive care for infection diabetes and mental status changes 2. Continue metoprolol for hypertension and heart rate control and would consider possibility of the addition of calcium channel blocker for better hypertension control if necessary 3. No further cardiac diagnostics necessary at this time 4. Begin ambulation and follow for improvements in need for further adjustments of medication management  Signed, Corey Skains M.D. [facility] 01/06/2017, 7:22 AM

## 2017-01-06 NOTE — Progress Notes (Signed)
[place] [facility] [place] NAME: Liliah Fauth    MR#:  [id number]  DATE OF BIRTH:  July 31, 1945  SUBJECTIVE:  CHIEF COMPLAINT:   Chief Complaint  Patient presents with  . Loss of Consciousness   More awake and less confused according to the husband. As a baseline mild memory problem.  REVIEW OF SYSTEMS:    Review of Systems  Unable to perform ROS: Mental status change    DRUG ALLERGIES:   Allergies  Allergen Reactions  . Watermelon Concentrate Anaphylaxis  . Ace Inhibitors   . Celebrex [Celecoxib] Rash  . Penicillins Rash  . Sulfa Antibiotics Rash    VITALS:  Blood pressure (!) 157/55, pulse 70, temperature 98.6 F (37 C), temperature source Oral, resp. rate (!) 23, height 5\' 3"  (1.6 m), weight 89.4 kg (197 lb), SpO2 96 %.  PHYSICAL EXAMINATION:   Physical Exam  GENERAL:  71 y.o.-year-old patient lying in the bed with no acute distress.  EYES: Pupils equal, round, reactive to light and accommodation. No scleral icterus. Extraocular muscles intact.  HEENT: Head atraumatic, normocephalic. Oropharynx and nasopharynx clear.  NECK:  Supple, no jugular venous distention. No thyroid enlargement, no tenderness.  LUNGS: Normal breath sounds bilaterally, no wheezing, rales, rhonchi. No use of accessory muscles of respiration.  CARDIOVASCULAR: S1, S2 normal. No murmurs, rubs, or gallops.  ABDOMEN: Soft, nontender, nondistended. Bowel sounds present. No organomegaly or mass.  EXTREMITIES: No cyanosis, clubbing or edema b/l.    NEUROLOGIC: Cranial nerves II through XII are intact. Left lower extremity weakness. PSYCHIATRIC: The patient is alert and Awake. Oriented to place. SKIN: No obvious rash, lesion, or ulcer.   LABORATORY PANEL:   CBC  Recent Labs Lab 01/06/17 0419  WBC 7.8  HGB 10.1*  HCT 30.4*  PLT 121*    ------------------------------------------------------------------------------------------------------------------ Chemistries   Recent Labs Lab 01/06/17 0419  NA 138  K 5.5*  CL 110  CO2 22  GLUCOSE 109*  BUN 42*  CREATININE 2.50*  CALCIUM 8.3*  AST 27  ALT 19  ALKPHOS 101  BILITOT 0.5   ------------------------------------------------------------------------------------------------------------------  Cardiac Enzymes  Recent Labs Lab 01/06/17 0419  TROPONINI 0.06*   ------------------------------------------------------------------------------------------------------------------  RADIOLOGY:  Ct Head Wo Contrast  Result Date: 01/05/2017 CLINICAL DATA:  Mental status changes. EXAM: CT HEAD WITHOUT CONTRAST TECHNIQUE: Contiguous axial images were obtained from the base of the skull through the vertex without intravenous contrast. COMPARISON:  02/15/2014 FINDINGS: Brain: There is no evidence for acute hemorrhage, hydrocephalus, mass lesion, or abnormal extra-axial fluid collection. No definite CT evidence for acute infarction. Diffuse loss of parenchymal volume is consistent with atrophy. Patchy low attenuation in the deep hemispheric and periventricular white matter is nonspecific, but likely reflects chronic microvascular ischemic demyelination. Vascular: No hyperdense vessel or unexpected calcification. Skull: No evidence for fracture. No worrisome lytic or sclerotic lesion. Sinuses/Orbits: The visualized paranasal sinuses and mastoid air cells are clear. Visualized portions of the globes and intraorbital fat are unremarkable. Other: None. IMPRESSION: 1. Stable exam.  No acute intracranial abnormality. 2. Atrophy with chronic small vessel white matter ischemic disease. Electronically Signed   By: Misty Stanley M.D.   On: 01/05/2017 13:54   Dg Chest Port 1 View  Result Date: 01/05/2017 CLINICAL DATA:  Severe sepsis with shock EXAM: PORTABLE CHEST 1 VIEW COMPARISON:  06/18/2016  FINDINGS: Chronic cardiopericardial enlargement. Dual-chamber pacer leads from the left are new from prior, positioned over the right ventricle and right atrium. Hazy peripheral  density at the left cardiac apex which could be partially artifactual. Prominent right infrahilar markings but similar to prior. IMPRESSION: 1. No definite pneumonia. There are prominent right infrahilar markings but similar to prior. 2. Artifact versus pleural based opacity at the left base. 3. Suggest follow-up. Electronically Signed   By: Monte Fantasia M.D.   On: 01/05/2017 13:42     ASSESSMENT AND PLAN:   Kanijah Colton  is a 71 y.o. female with a known history of congestive heart failure, hypertension, hypothyroidism, insulin requiring diabetes mellitus and other medical problems is brought into the ED for altered mental status by her husband. Patient was found to be unresponsive at 9:30 AM today and patient was in her usual state of health until last night.Husband is at bedside and providing history   # Acute encephalopathy could be CVA due to some left sided weakness CT head showed no CVA Cannot get MRI Due to some left-sided weakness. Difficult to examine patient-she has confusion. Discussed with Dr. Irish Elders of neurology who will see the patient. Check ammonia level  # Acute kidney injury with hyperkalemia. We will increase the IV fluid rate. Monitor input and output. Repeat labs in the morning.  # Possible left lower lobe pneumonia No fever or cough. Normal WBC. On IV antibiotics. Mild elevation for calcitonin and will continue antibiotics at this time. Waiting for culture results.  # Insulin requiring diabetes mellitus Patient is currently nothing by mouth waiting for swallow evaluation Will continue Lantus 15 units for basal coverage patient usually takes 60 units at home Sliding scale insulin  # Hypothyroidism TSH normal. On IV Synthroid while she is nothing by mouth.  # Mild elevation in troponin.  Seen by cardiology. No further investigations needed.  # Hypertension blood pressure is elevated Has not received her medications as she is nothing by mouth. We'll restart medications once on a diet.  All the records are reviewed and case discussed with Care Management/Social Worker Management plans discussed with the patient, family and they are in agreement.  CODE STATUS: FULL CODE  DVT Prophylaxis: SCDs  TOTAL TIME TAKING CARE OF THIS PATIENT: 35 minutes.   POSSIBLE D/C IN 1-2 DAYS, DEPENDING ON CLINICAL CONDITION.  Hillary Bow R M.D on 01/06/2017 at 11:22 AM  Between 7am to 6pm - Pager - [phone]  After 6pm go to [email or web address] - password [email or web address] [facility]  Office  [phone]  CC: Primary care physician; Patient, No Pcp Per  Note: This dictation was prepared with Dragon dictation along with smaller phrase technology. Any transcriptional errors that result from this process are unintentional.

## 2017-01-06 NOTE — Evaluation (Signed)
Clinical/Bedside Swallow Evaluation Patient Details  Name: Andrea [name] MRN: [id number] Date of Birth: 04/11/1946  Today's Date: 01/06/2017 Time: SLP Start Time (ACUTE ONLY): 1425 SLP Stop Time (ACUTE ONLY): 1525 SLP Time Calculation (min) (ACUTE ONLY): 60 min  Past Medical History:  Past Medical History:  Diagnosis Date  . CHF (congestive heart failure) ([place])   . Chronic back pain    Followed by pain clinic in [place]  . Endometriosis   . HTN (hypertension)   . Hypothyroidism   . IDDM (insulin dependent diabetes mellitus) ([place])   . Mild dementia   . NASH (nonalcoholic steatohepatitis)    has had liver biopsy in past  . Obesity   . PUD (peptic ulcer disease) 1990's   Past Surgical History:  Past Surgical History:  Procedure Laterality Date  . CHOLECYSTECTOMY    . TOTAL ABDOMINAL HYSTERECTOMY W/ BILATERAL SALPINGOOPHORECTOMY     HPI:  Pt is an 71 y.o. female  with a known history of multiple medical issues including NASH, Obesity, Esophageal Stricture w/ recent dilitation ~5 months ago, mild Dementia, congestive heart failure, hypertension, hypothyroidism, insulin requiring diabetes mellitus brought into the ED for altered mental status by her husband. Patient was found to be unresponsive at 9:30 AM today and patient was in her usual state of health until last night. Today pt appears to have much improved; less lethargic.    Assessment / Plan / Recommendation Clinical Impression  Pt appears to present w/ adequate oropharyngeal phase swallow function w/ no overt s/s of aspiration noted; however, d/t baseline Cognitive/medical status' and overall weakness, she exhibits min increased risk for dysphagia/aspiration. She gave min assisance to self feeding task and consumed trials of thin liquids via cup, purees, and softened solids. Pt exhibited adequate oral phase for the broken down trials; moistened well. Oral phase was wfl for the modified texture food. She appeared to tolerate sips  of thin liquids w/ no decline in respiratory status and no change in vocal quality but noted easy distraction w/ min cues required by SLP to re-attend to drinking task. Education given to husband present on aspiration precautions including reducing distraction in environment. Suspect pt's presentation Buys be related to her baseline mild Dementia and current illness. Per husband, pt also has risk for reflux and retrograde activity of oral intake d/t possible GI stricture - recently dilated by GI ~5 months ago but husband feels "it has returned". Recommend f/u w/ GI. Recommend a modifed diet d/t overall status w/ aspiration precautions; pills in Puree; assistance at meals.  SLP Visit Diagnosis: Dysphagia, oropharyngeal phase (R13.12)    Aspiration Risk  Mild aspiration risk (d/t cognitive/medical status')    Diet Recommendation  Dysphagia level 2(minced, cut well and moist); Thin liquids. Aspiration precautions; Reflux precautions.   Medication Administration: Whole meds with puree    Other  Recommendations Recommended Consults: Consider GI evaluation (Dietician f/u) Oral Care Recommendations: Oral care BID;Staff/trained caregiver to provide oral care   Follow up Recommendations None (TBD)      Frequency and Duration min 2x/week  1 week       Prognosis Prognosis for Safe Diet Advancement: Fair (-Good) Barriers to Reach Goals: Cognitive deficits      Swallow Study   General Date of Onset: 01/05/17 HPI: Pt is an 71 y.o. female  with a known history of multiple medical issues including NASH, Obesity, Esophageal Stricture w/ recent dilitation ~5 months ago, mild Dementia, congestive heart failure, hypertension, hypothyroidism, insulin requiring diabetes mellitus brought  into the ED for altered mental status by her husband. Patient was found to be unresponsive at 9:30 AM today and patient was in her usual state of health until last night. Today pt appears to have much improved; less lethargic.   Type of Study: Bedside Swallow Evaluation Previous Swallow Assessment: none reported Diet Prior to this Study: Regular;Thin liquids (some softer foods at home per report) Temperature Spikes Noted:  (at admission; wbc not elevated ) Respiratory Status: Nasal cannula (2 liters) History of Recent Intubation: No Behavior/Cognition: Alert;Cooperative;Pleasant mood;Confused;Distractible;Requires cueing (mild Dementia baseline) Oral Cavity Assessment: Dry Oral Care Completed by SLP: Recent completion by staff Oral Cavity - Dentition: Poor condition;Missing dentition (few front lower dentition only) Vision: Functional for self-feeding (but distracted) Self-Feeding Abilities: Able to feed self;Needs assist;Needs set up;Total assist Patient Positioning: Upright in bed Baseline Vocal Quality: Normal;Low vocal intensity Volitional Cough: Strong Volitional Swallow: Able to elicit    Oral/Motor/Sensory Function Overall Oral Motor/Sensory Function: Within functional limits   Ice Chips Ice chips: Within functional limits Presentation: Spoon (3 trials)   Thin Liquid Thin Liquid: Within functional limits Presentation: Cup;Self Fed (assisted; 6 trials) Other Comments: but distracted and required min cues to attend to drinking task    Nectar Thick Nectar Thick Liquid: Not tested   Honey Thick Honey Thick Liquid: Not tested   Puree Puree: Within functional limits Presentation: Spoon (fed; 9 trials) Other Comments: distracted   Solid   GO   Solid: Impaired Presentation: Spoon (fed; 4 trials) Oral Phase Impairments: Impaired mastication (missing dentition) Oral Phase Functional Implications: Impaired mastication (missing dentition) Pharyngeal Phase Impairments:  (none) Other Comments: minced and softened the trials    Functional Assessment Tool Used: clinical judgement Functional Limitations: Swallowing Swallow Current Status (Y3888): At least 1 percent but less than 20 percent impaired,  limited or restricted Swallow Goal Status [phone]): At least 1 percent but less than 20 percent impaired, limited or restricted Swallow Discharge Status [phone]): At least 1 percent but less than 20 percent impaired, limited or restricted     Orinda Kenner, MS, CCC-SLP Xavia Kniskern 01/06/2017,3:34 PM

## 2017-01-06 NOTE — Progress Notes (Signed)
Pharmacy Antibiotic Note  Andrea [name] is a 71 y.o. female admitted on 01/05/2017 with sepsis.  Pharmacy has been consulted for aztreonam, levofloxacin, and vancomycin dosing. Patient had an increased in serum creatinine from 2.08 to 2.05 mg/dl.    Plan: Will dose abxs as if CrCl <10 ml/min due to AKI.  1. Change Aztreonam to 500 mg IV Q8H 2. Change Levofloxacin to  500 mg IV Q48H 3. Vancomycin- Will order Vancomycin Random level ~36 hours post dose to assess appropriateness of dose. Patient likely will need to be dosed per levels. Serum creatinine ordered to be drawn with am labs.   Vd 46.8 L, Ke 0.026 hr-1, T1/2 26.5 hr  Height: 5\' 3"  (160 cm) Weight: 197 lb (89.4 kg) IBW/kg (Calculated) : 52.4  Temp (24hrs), Avg:99 F (37.2 C), Min:98.5 F (36.9 C), Max:99.9 F (37.7 C)   Recent Labs Lab 01/05/17 1107 01/05/17 1136 01/05/17 1709 01/06/17 0419  WBC 11.3*  --   --  7.8  CREATININE 2.08*  --   --  2.50*  LATICACIDVEN  --  0.9 0.8  --     Estimated Creatinine Clearance: 21.9 mL/min (A) (by C-G formula based on SCr of 2.5 mg/dL (H)).    Allergies  Allergen Reactions  . Watermelon Concentrate Anaphylaxis  . Ace Inhibitors   . Celebrex [Celecoxib] Rash  . Penicillins Rash  . Sulfa Antibiotics Rash   Thank you for allowing pharmacy to be a part of this patient's care.  Kvion Shapley D, Pharm.D., BCPS Clinical Pharmacist 01/06/2017 2:20 PM

## 2017-01-06 NOTE — Progress Notes (Signed)
Dr. Jannifer Franklin notified of patients troponin 0.06, acknowledged. No new orders at present.

## 2017-01-07 ENCOUNTER — Inpatient Hospital Stay: Payer: Medicare HMO

## 2017-01-07 DIAGNOSIS — R4182 Altered mental status, unspecified: Secondary | ICD-10-CM

## 2017-01-07 LAB — CBC WITH DIFFERENTIAL/PLATELET
BASOS ABS: 0 10*3/uL (ref 0–0.1)
BASOS PCT: 1 %
EOS ABS: 0.3 10*3/uL (ref 0–0.7)
EOS PCT: 5 %
HEMATOCRIT: 28.9 % — AB (ref 35.0–47.0)
Hemoglobin: 9.5 g/dL — ABNORMAL LOW (ref 12.0–16.0)
LYMPHS PCT: 15 %
Lymphs Abs: 1 10*3/uL (ref 1.0–3.6)
MCH: 28.2 pg (ref 26.0–34.0)
MCHC: 32.9 g/dL (ref 32.0–36.0)
MCV: 85.6 fL (ref 80.0–100.0)
MONO ABS: 0.6 10*3/uL (ref 0.2–0.9)
MONOS PCT: 9 %
NEUTROS PCT: 70 %
Neutro Abs: 4.6 10*3/uL (ref 1.4–6.5)
PLATELETS: 128 10*3/uL — AB (ref 150–440)
RBC: 3.38 MIL/uL — ABNORMAL LOW (ref 3.80–5.20)
RDW: 17 % — AB (ref 11.5–14.5)
WBC: 6.4 10*3/uL (ref 3.6–11.0)

## 2017-01-07 LAB — GLUCOSE, CAPILLARY
GLUCOSE-CAPILLARY: 169 mg/dL — AB (ref 65–99)
Glucose-Capillary: 131 mg/dL — ABNORMAL HIGH (ref 65–99)
Glucose-Capillary: 154 mg/dL — ABNORMAL HIGH (ref 65–99)
Glucose-Capillary: 155 mg/dL — ABNORMAL HIGH (ref 65–99)

## 2017-01-07 LAB — BASIC METABOLIC PANEL
ANION GAP: 6 (ref 5–15)
BUN: 50 mg/dL — ABNORMAL HIGH (ref 6–20)
CHLORIDE: 111 mmol/L (ref 101–111)
CO2: 20 mmol/L — AB (ref 22–32)
Calcium: 8.4 mg/dL — ABNORMAL LOW (ref 8.9–10.3)
Creatinine, Ser: 2.84 mg/dL — ABNORMAL HIGH (ref 0.44–1.00)
GFR calc non Af Amer: 16 mL/min — ABNORMAL LOW (ref >60)
GFR, EST AFRICAN AMERICAN: 18 mL/min — AB (ref >60)
Glucose, Bld: 147 mg/dL — ABNORMAL HIGH (ref 65–99)
POTASSIUM: 5.4 mmol/L — AB (ref 3.5–5.1)
Sodium: 137 mmol/L (ref 135–145)

## 2017-01-07 LAB — AMMONIA: Ammonia: 10 umol/L (ref 9–35)

## 2017-01-07 LAB — CK: Total CK: 56 U/L (ref 38–234)

## 2017-01-07 LAB — PROCALCITONIN: Procalcitonin: 1.93 ng/mL

## 2017-01-07 MED ORDER — AMLODIPINE BESYLATE 10 MG PO TABS
10.0000 mg | ORAL_TABLET | Freq: Every day | ORAL | Status: DC
  Administered 2017-01-07 – 2017-01-11 (×5): 10 mg via ORAL
  Filled 2017-01-07 (×5): qty 1

## 2017-01-07 MED ORDER — PAROXETINE HCL 20 MG PO TABS
40.0000 mg | ORAL_TABLET | Freq: Every day | ORAL | Status: DC
  Administered 2017-01-08 – 2017-01-11 (×4): 40 mg via ORAL
  Filled 2017-01-07 (×4): qty 2

## 2017-01-07 MED ORDER — CARVEDILOL 12.5 MG PO TABS
12.5000 mg | ORAL_TABLET | Freq: Two times a day (BID) | ORAL | Status: DC
  Administered 2017-01-07 – 2017-01-11 (×8): 12.5 mg via ORAL
  Filled 2017-01-07 (×2): qty 4
  Filled 2017-01-07: qty 1
  Filled 2017-01-07 (×3): qty 4
  Filled 2017-01-07: qty 1
  Filled 2017-01-07: qty 4
  Filled 2017-01-07 (×4): qty 1
  Filled 2017-01-07: qty 4
  Filled 2017-01-07: qty 1

## 2017-01-07 MED ORDER — ATORVASTATIN CALCIUM 20 MG PO TABS
20.0000 mg | ORAL_TABLET | Freq: Every day | ORAL | Status: DC
  Administered 2017-01-07 – 2017-01-10 (×4): 20 mg via ORAL
  Filled 2017-01-07 (×4): qty 1

## 2017-01-07 MED ORDER — SODIUM CHLORIDE 0.9 % IV SOLN
INTRAVENOUS | Status: DC
  Administered 2017-01-07 – 2017-01-08 (×2): via INTRAVENOUS

## 2017-01-07 MED ORDER — IPRATROPIUM-ALBUTEROL 0.5-2.5 (3) MG/3ML IN SOLN
3.0000 mL | RESPIRATORY_TRACT | Status: DC | PRN
  Administered 2017-01-07 – 2017-01-10 (×2): 3 mL via RESPIRATORY_TRACT
  Filled 2017-01-07 (×2): qty 3

## 2017-01-07 MED ORDER — CLOPIDOGREL BISULFATE 75 MG PO TABS
75.0000 mg | ORAL_TABLET | Freq: Every day | ORAL | Status: DC
  Administered 2017-01-07 – 2017-01-11 (×5): 75 mg via ORAL
  Filled 2017-01-07 (×5): qty 1

## 2017-01-07 MED ORDER — LEVOTHYROXINE SODIUM 50 MCG PO TABS
150.0000 ug | ORAL_TABLET | Freq: Every day | ORAL | Status: DC
  Administered 2017-01-08 – 2017-01-11 (×4): 150 ug via ORAL
  Filled 2017-01-07 (×4): qty 1

## 2017-01-07 MED ORDER — ASPIRIN EC 81 MG PO TBEC
81.0000 mg | DELAYED_RELEASE_TABLET | Freq: Every day | ORAL | Status: DC
  Administered 2017-01-08: 09:00:00 81 mg via ORAL
  Filled 2017-01-07: qty 1

## 2017-01-07 MED ORDER — APIXABAN 2.5 MG PO TABS
2.5000 mg | ORAL_TABLET | Freq: Two times a day (BID) | ORAL | Status: DC
  Administered 2017-01-07 – 2017-01-11 (×9): 2.5 mg via ORAL
  Filled 2017-01-07 (×9): qty 1

## 2017-01-07 MED ORDER — LORAZEPAM 2 MG/ML IJ SOLN
0.5000 mg | Freq: Once | INTRAMUSCULAR | Status: AC
  Administered 2017-01-07: 0.5 mg via INTRAVENOUS
  Filled 2017-01-07: qty 1

## 2017-01-07 NOTE — Progress Notes (Signed)
Medications administered by student RN [id number] with supervision of Clinical Instructor Kariann Wecker MSN, RN-BC.  

## 2017-01-07 NOTE — Progress Notes (Addendum)
Pharmacy Antibiotic Note  Andrea [name] is a 71 y.o. female admitted on 01/05/2017 with sepsis.  Pharmacy has been consulted for aztreonam, levofloxacin, and vancomycin dosing. In ED she has a temperature of 100.1, decreased responsiveness/AMS, tachypnea, and leukocytosis (11.3).   Plan:  8/25 1. Aztreonam 1 gm IV Q8H 2. Levofloxacin 750 mg IV Q48H 3. Vancomycin 1 gm IV x 1 in ED followed in approximately 15 hours (stacked dosing) by vancomycin 1.25 gm IV Q36H, predicted trough 17 mcg/mL. Pharmacy will continue to follow and adjust as needed to maintain trough 15 to 20 mcg/mL.   8/27 Aztreonam and vancomycin were discontinued. Will continue levofloxacin 500mg  Q48H. Pharmacy will continue to follow and adjust as needed.   Vd 46.8 L, Ke 0.026 hr-1, T1/2 26.5 hr  Height: 5\' 3"  (160 cm) Weight: 197 lb (89.4 kg) IBW/kg (Calculated) : 52.4  Temp (24hrs), Avg:98 F (36.7 C), Min:97.7 F (36.5 C), Max:98.1 F (36.7 C)   Recent Labs Lab 01/05/17 1107 01/05/17 1136 01/05/17 1709 01/06/17 0419 01/07/17 0502 01/07/17 0729  WBC 11.3*  --   --  7.8  --  6.4  CREATININE 2.08*  --   --  2.50* 2.84*  --   LATICACIDVEN  --  0.9 0.8  --   --   --     Estimated Creatinine Clearance: 19.3 mL/min (A) (by C-G formula based on SCr of 2.84 mg/dL (H)).    Allergies  Allergen Reactions  . Watermelon Concentrate Anaphylaxis  . Ace Inhibitors   . Celebrex [Celecoxib] Rash  . Penicillins Rash  . Sulfa Antibiotics Rash   Microbiology Results: BCx>> NG D2  Thank you for allowing pharmacy to be a part of this patient's care.  Lendon Ka, PharmD Pharmacy Resident 01/07/2017 2:59 PM

## 2017-01-07 NOTE — Progress Notes (Signed)
[facility] [place] NAME: Andrea [name]    MR#:  [id number]  DATE OF BIRTH:  February 26, 1946  SUBJECTIVE:  CHIEF COMPLAINT:   Chief Complaint  Patient presents with  . Loss of Consciousness   More awake and less confused. She had some myoclonic jerks yesterday which have resolved. Patient is more verbal.  REVIEW OF SYSTEMS:    Review of Systems  Unable to perform ROS: Mental status change    DRUG ALLERGIES:   Allergies  Allergen Reactions  . Watermelon Concentrate Anaphylaxis  . Ace Inhibitors   . Celebrex [Celecoxib] Rash  . Penicillins Rash  . Sulfa Antibiotics Rash    VITALS:  Blood pressure (!) 160/63, pulse 66, temperature 98 F (36.7 C), temperature source Oral, resp. rate 20, height 5\' 3"  (1.6 m), weight 89.4 kg (197 lb), SpO2 97 %.  PHYSICAL EXAMINATION:   Physical Exam  GENERAL:  71 y.o.-year-old patient lying in the bed with no acute distress.  EYES: Pupils equal, round, reactive to light and accommodation. No scleral icterus. Extraocular muscles intact.  HEENT: Head atraumatic, normocephalic. Oropharynx and nasopharynx clear.  NECK:  Supple, no jugular venous distention. No thyroid enlargement, no tenderness.  LUNGS: Normal breath sounds bilaterally, no wheezing, rales, rhonchi. No use of accessory muscles of respiration.  CARDIOVASCULAR: S1, S2 normal. No murmurs, rubs, or gallops.  ABDOMEN: Soft, nontender, nondistended. Bowel sounds present. No organomegaly or mass.  EXTREMITIES: No cyanosis, clubbing or edema b/l.    NEUROLOGIC: Cranial nerves II through XII are intact. Left lower extremity weakness. PSYCHIATRIC: The patient is alert and Awake. Oriented to place. SKIN: No obvious rash, lesion, or ulcer.   LABORATORY PANEL:   CBC  Recent Labs Lab 01/07/17 0729  WBC 6.4  HGB 9.5*  HCT 28.9*  PLT 128*    ------------------------------------------------------------------------------------------------------------------ Chemistries   Recent Labs Lab 01/06/17 0419 01/07/17 0502  NA 138 137  K 5.5* 5.4*  CL 110 111  CO2 22 20*  GLUCOSE 109* 147*  BUN 42* 50*  CREATININE 2.50* 2.84*  CALCIUM 8.3* 8.4*  AST 27  --   ALT 19  --   ALKPHOS 101  --   BILITOT 0.5  --    ------------------------------------------------------------------------------------------------------------------  Cardiac Enzymes  Recent Labs Lab 01/06/17 0419  TROPONINI 0.06*   ------------------------------------------------------------------------------------------------------------------  RADIOLOGY:  Dg Chest 2 View  Result Date: 01/07/2017 CLINICAL DATA:  Pneumonia. EXAM: CHEST  2 VIEW COMPARISON:  01/05/2017 chest radiograph. FINDINGS: Stable configuration of 2 lead left subclavian pacemaker. Stable cardiomediastinal silhouette with mild to moderate cardiomegaly and aortic atherosclerosis. No pneumothorax. Small left pleural effusion, slightly decreased. No right pleural effusion. Cephalization of the pulmonary vasculature without overt pulmonary edema. Mild left basilar opacity, slightly decreased. IMPRESSION: 1. Stable cardiomegaly without overt pulmonary edema. 2. Small left pleural effusion, decreased. 3. Mild left basilar lung opacity, slightly decreased, favor atelectasis. Electronically Signed   By: Ilona Sorrel M.D.   On: 01/07/2017 07:59   Ct Head Wo Contrast  Result Date: 01/07/2017 CLINICAL DATA:  Altered mental status, found unresponsive, left-sided weakness EXAM: CT HEAD WITHOUT CONTRAST TECHNIQUE: Contiguous axial images were obtained from the base of the skull through the vertex without intravenous contrast. COMPARISON:  01/05/2017 FINDINGS: Brain: Stable brain atrophy and chronic white matter microvascular ischemic changes throughout both cerebral hemispheres. Stable associated ventricular  enlargement. No acute intracranial hemorrhage, mass lesion, definite new infarction, midline shift, herniation, or extra-axial fluid collection. No focal mass effect  or edema. Cisterns are patent. Cerebellar atrophy as well. Overall stable exam Vascular: No hyperdense vessel or unexpected calcification. Skull: Normal. Negative for fracture or focal lesion. Sinuses/Orbits: No acute finding. Other: None. IMPRESSION: Stable atrophy and chronic white matter microvascular ischemic changes. No acute intracranial abnormality by noncontrast CT. Electronically Signed   By: Jerilynn Mages.  Shick M.D.   On: 01/07/2017 08:13   Ct Head Wo Contrast  Result Date: 01/05/2017 CLINICAL DATA:  Mental status changes. EXAM: CT HEAD WITHOUT CONTRAST TECHNIQUE: Contiguous axial images were obtained from the base of the skull through the vertex without intravenous contrast. COMPARISON:  02/15/2014 FINDINGS: Brain: There is no evidence for acute hemorrhage, hydrocephalus, mass lesion, or abnormal extra-axial fluid collection. No definite CT evidence for acute infarction. Diffuse loss of parenchymal volume is consistent with atrophy. Patchy low attenuation in the deep hemispheric and periventricular white matter is nonspecific, but likely reflects chronic microvascular ischemic demyelination. Vascular: No hyperdense vessel or unexpected calcification. Skull: No evidence for fracture. No worrisome lytic or sclerotic lesion. Sinuses/Orbits: The visualized paranasal sinuses and mastoid air cells are clear. Visualized portions of the globes and intraorbital fat are unremarkable. Other: None. IMPRESSION: 1. Stable exam.  No acute intracranial abnormality. 2. Atrophy with chronic small vessel white matter ischemic disease. Electronically Signed   By: Misty Stanley M.D.   On: 01/05/2017 13:54   Dg Chest Port 1 View  Result Date: 01/05/2017 CLINICAL DATA:  Severe sepsis with shock EXAM: PORTABLE CHEST 1 VIEW COMPARISON:  06/18/2016 FINDINGS: Chronic  cardiopericardial enlargement. Dual-chamber pacer leads from the left are new from prior, positioned over the right ventricle and right atrium. Hazy peripheral density at the left cardiac apex which could be partially artifactual. Prominent right infrahilar markings but similar to prior. IMPRESSION: 1. No definite pneumonia. There are prominent right infrahilar markings but similar to prior. 2. Artifact versus pleural based opacity at the left base. 3. Suggest follow-up. Electronically Signed   By: Monte Fantasia M.D.   On: 01/05/2017 13:42   Dg Hip Unilat With Pelvis 2-3 Views Left  Result Date: 01/06/2017 CLINICAL DATA:  71 year old female with altered mental status, found unresponsive. Left hip pain. EXAM: DG HIP (WITH OR WITHOUT PELVIS) 2-3V LEFT COMPARISON:  Abdomen radiograph 06/22/2011. CT Abdomen and Pelvis 05/19/2011. FINDINGS: Femoral heads are normally located. The pelvis appears intact. Grossly intact proximal right femur. The proximal left femur appears intact. Chronic iliofemoral calcified atherosclerosis. Negative visible bowel gas pattern and pelvic visceral contours. Transitional lumbosacral anatomy. IMPRESSION: No acute osseous abnormality identified about the left hip or pelvis. Electronically Signed   By: Genevie Ann M.D.   On: 01/06/2017 14:48     ASSESSMENT AND PLAN:   Johneisha Bondarenko  is a 71 y.o. female with a known history of congestive heart failure, hypertension, hypothyroidism, insulin requiring diabetes mellitus and other medical problems is brought into the ED for altered mental status by her husband. Patient was found to be unresponsive at 9:30 AM today and patient was in her usual state of health until last night.Husband is at bedside and providing history   # Acute encephalopathy likely due to CVA due to some left sided weakness CT head showed no CVA. Repeat CT head ordered today shows no stroke. Cannot get MRI Due to some left-sided weakness. Difficult to examine patient-she  has confusion. Symptoms are improving. We'll also check carotid Dopplers and echocardiogram. Patient is on Eliquis due to history of atrial fibrillation.  # Acute kidney injury with hyperkalemia.  Worsening in spite of IV fluids. Etiology unclear. Will consult nephrology. Monitor input and output.  # Left lower lobe pneumonia - improved on chest x-ray. No fever or cough. Normal WBC.  But does have elevated pro calcitonin and no other source of infection. We'll continue to treat with IV antibiotics.  # Insulin requiring diabetes mellitus Poor oral intake. On reduced dose of Lantus and sliding scale insulin.  # Hypothyroidism On levothyroxine  # Mild elevation in troponin. Seen by cardiology. No further investigations needed.  # Hypertension, uncontrolled. Improved with her home medications.  All the records are reviewed and case discussed with Care Management/Social Worker Management plans discussed with the patient, family and they are in agreement.  CODE STATUS: FULL CODE  DVT Prophylaxis: SCDs  TOTAL TIME TAKING CARE OF THIS PATIENT: 35 minutes.   POSSIBLE D/C IN 1-2 DAYS, DEPENDING ON CLINICAL CONDITION.  Hillary Bow R M.D on 01/07/2017 at 10:12 AM  Between 7am to 6pm - Pager - [phone]  After 6pm go to [email or web address] - password [email or web address] [facility]  Office  [phone]  CC: Primary care physician; Patient, No Pcp Per  Note: This dictation was prepared with Dragon dictation along with smaller phrase technology. Any transcriptional errors that result from this process are unintentional.

## 2017-01-07 NOTE — Progress Notes (Signed)
  Speech Language Pathology Treatment: Dysphagia  Patient Details Name: Andrea [name] Chiao MRN: [id number] DOB: [birth year] Today's Date: 01/07/2017 Time: 1300-1330 SLP Time Calculation (min) (ACUTE ONLY): 30 min  Assessment / Plan / Recommendation Clinical Impression  Pt seen at bedside for assessment of diet tolerance. Pt exhibited good intake at lunch, and did not exhibit any overt s/s aspiration. Pt alert, pleasant and cooperative. Recommend continuing with current diet (dys 2/thin). ST will follow for diet tolerance and education.    HPI HPI: Pt is an 71 y.o. female  with a known history of multiple medical issues including NASH, Obesity, Esophageal Stricture w/ recent dilitation ~5 months ago, mild Dementia, congestive heart failure, hypertension, hypothyroidism, insulin requiring diabetes mellitus brought into the ED for altered mental status by her husband. Patient was found to be unresponsive at 9:30 AM today and patient was in her usual state of health until last night. Today pt appears to have much improved; less lethargic.       SLP Plan  Continue with current plan of care       Recommendations  Diet recommendations: Dysphagia 2 (fine chop);Thin liquid Liquids provided via: Cup;No straw Medication Administration: Whole meds with puree Supervision: Patient able to self feed Compensations: Minimize environmental distractions;Slow rate;Small sips/bites Postural Changes and/or Swallow Maneuvers: Seated upright 90 degrees                General recommendations: Rehab consult Oral Care Recommendations: Oral care BID;Staff/trained caregiver to provide oral care Follow up Recommendations: None SLP Visit Diagnosis: Dysphagia, oropharyngeal phase (R13.12) Plan: Continue with current plan of care       GO               Celia B. Quentin Ore, [facility], [place] Speech Pathologist [phone]  Shonna Chock 01/07/2017, 1:36 PM

## 2017-01-07 NOTE — Consult Note (Signed)
[facility] CONSULT NOTE    Date: 01/07/2017                  Patient Name:  Andrea [name]  MRN: [id number]  DOB: [birth year]  Age / Sex: 71 y.o., female         PCP: Patient, No Pcp Per                 Service Requesting Consult: Hospitalist                 Reason for Consult: Acute renal failure/CKD stage IV            History of Present Illness: Patient is a 71 y.o. female with a PMHx of Congestive heart failure, hypertension, hypothyroidism, diabetes mellitus2, nonalcoholic fatty liver disease, obesity, peptic ulcer disease, chronic kidney disease stage IV, who was admitted to [facility] on 01/05/2017 for evaluation of altered mental status. Patient cannot recall why she was admitted to the hospital. Her husband is at the bedside and provides most of the history. Patient's husband state that he came to give the patient her a.m. medications at approximately 9:30 AM. However she was not very responsive at that time and was not following commands.  Head CT was performed and was negative for acute stroke. We are asked to see her for evaluation management of chronic kidney disease stage IV. She gets most of her primary care at [facility]. She has not yet established with a nephrologist. Her baseline creatinine appears to be 2.3. Creatinine at the moment is a bit higher.   Medications: Outpatient medications: Prescriptions Prior to Admission  Medication Sig Dispense Refill Last Dose  . acetaminophen (TYLENOL) 325 MG tablet Take 650 mg by mouth 3 (three) times daily as needed.     prn at prn  . amLODipine (NORVASC) 10 MG tablet TAKE ONE TABLET BY MOUTH ONCE DAILY *NEEDS APPOINTMENT FOR FURTHER REFILLS* 30 tablet 0 01/04/2017 at Unknown time  . aspirin 81 MG tablet Take 81 mg by mouth daily.   01/04/2017 at Unknown time  . atorvastatin (LIPITOR) 20 MG tablet TAKE ONE TABLET BY MOUTH ONCE DAILY 90 tablet 0 01/04/2017 at Unknown time  . carvedilol (COREG) 12.5 MG tablet Take 1 tablet by  mouth 2 (two) times daily with a meal.    01/04/2017 at Unknown time  . Cholecalciferol (VITAMIN D HIGH POTENCY PO) Take 1 capsule by mouth daily.     01/04/2017 at Unknown time  . clopidogrel (PLAVIX) 75 MG tablet Take 1 tablet (75 mg total) by mouth daily. 30 tablet 6 01/04/2017 at Unknown time  . ELIQUIS 5 MG TABS tablet Take 1 tablet by mouth 2 (two) times daily.   01/04/2017 at Unknown time  . furosemide (LASIX) 40 MG tablet Take 40 mg by mouth daily.    01/04/2017 at Unknown time  . gabapentin (NEURONTIN) 600 MG tablet Take 1 tablet by mouth 2 (two) times daily.   01/04/2017 at Unknown time  . insulin aspart (NOVOLOG) 100 UNIT/ML injection Inject 3-15 Units into the skin 3 (three) times daily before meals. Sliding scale 1 vial 3 prn at prn  . Insulin Glargine (TOUJEO SOLOSTAR) 300 UNIT/ML SOPN Inject 60 Units into the skin daily. (Patient taking differently: Inject 65 Units into the skin daily. ) 1 pen 0 01/04/2017 at Unknown time  . levothyroxine (SYNTHROID, LEVOTHROID) 175 MCG tablet Take 1 tablet by mouth daily.   01/04/2017 at Unknown time  . PARoxetine (  PAXIL) 40 MG tablet Take 1 tablet by mouth daily.   01/04/2017 at Unknown time  . vitamin B-12 (CYANOCOBALAMIN) 1000 MCG tablet Take 5,000 mcg by mouth daily.    01/04/2017 at Unknown time  . losartan (COZAAR) 50 MG tablet Take 1 tablet (50 mg total) by mouth daily. 30 tablet 6 Taking    Current medications: Current Facility-Administered Medications  Medication Dose Route Frequency Provider Last Rate Last Dose  . acetaminophen (TYLENOL) tablet 650 mg  650 mg Oral Q6H PRN Gouru, Aruna, MD   650 mg at 01/06/17 2128   Or  . acetaminophen (TYLENOL) suppository 650 mg  650 mg Rectal Q6H PRN Gouru, Aruna, MD      . amLODipine (NORVASC) tablet 10 mg  10 mg Oral Daily Hillary Bow, MD   10 mg at 01/07/17 1036  . apixaban (ELIQUIS) tablet 2.5 mg  2.5 mg Oral BID Hillary Bow, MD   2.5 mg at 01/07/17 0915  . insulin aspart (novoLOG) injection 0-5  Units  0-5 Units Subcutaneous QHS Gouru, Aruna, MD      . insulin aspart (novoLOG) injection 0-9 Units  0-9 Units Subcutaneous TID WC Gouru, Aruna, MD   2 Units at 01/07/17 1153  . insulin glargine (LANTUS) injection 15 Units  15 Units Subcutaneous Daily Nicholes Mango, MD   15 Units at 01/07/17 0814  . levofloxacin (LEVAQUIN) IVPB 500 mg  500 mg Intravenous Q48H Nance Pear, MD   Stopped at 01/07/17 1015  . [START ON 01/08/2017] levothyroxine (SYNTHROID, LEVOTHROID) tablet 150 mcg  150 mcg Oral QAC breakfast Sudini, Alveta Heimlich, MD      . MEDLINE mouth rinse  15 mL Mouth Rinse BID Hillary Bow, MD   15 mL at 01/07/17 1000  . metoprolol tartrate (LOPRESSOR) injection 5 mg  5 mg Intravenous Q4H PRN Gouru, Aruna, MD      . ondansetron (ZOFRAN) tablet 4 mg  4 mg Oral Q6H PRN Gouru, Aruna, MD       Or  . ondansetron (ZOFRAN) injection 4 mg  4 mg Intravenous Q6H PRN Nicholes Mango, MD          Allergies: Allergies  Allergen Reactions  . Watermelon Concentrate Anaphylaxis  . Ace Inhibitors   . Celebrex [Celecoxib] Rash  . Penicillins Rash  . Sulfa Antibiotics Rash      Past Medical History: Past Medical History:  Diagnosis Date  . CHF (congestive heart failure) ([place])   . Chronic back pain    Followed by pain clinic in [place]  . Endometriosis   . HTN (hypertension)   . Hypothyroidism   . IDDM (insulin dependent diabetes mellitus) ([place])   . Mild dementia   . NASH (nonalcoholic steatohepatitis)    has had liver biopsy in past  . Obesity   . PUD (peptic ulcer disease) 1990's     Past Surgical History: Past Surgical History:  Procedure Laterality Date  . CHOLECYSTECTOMY    . TOTAL ABDOMINAL HYSTERECTOMY W/ BILATERAL SALPINGOOPHORECTOMY       Family History: Family History  Problem Relation Age of Onset  . COPD Sister   . Cancer Brother   . Emphysema Mother   . Heart disease Father      Social History: Social History   Social History  . Marital status: Married     Spouse name: N/A  . Number of children: N/A  . Years of education: N/A   Occupational History  . retired    Social History Main Topics  .  Smoking status: Former Research scientist (life sciences)  . Smokeless tobacco: Never Used     Comment: Quit 1985  . Alcohol use No  . Drug use: No  . Sexual activity: Not on file   Other Topics Concern  . Not on file   Social History Narrative   Lives with husband     Review of Systems: Patient confused and unable to concentrate on ROS questions.   Vital Signs: Blood pressure (!) 157/67, pulse 71, temperature 98.1 F (36.7 C), temperature source Oral, resp. rate 20, height 5\' 3"  (1.6 m), weight 89.4 kg (197 lb), SpO2 97 %.  Weight trends: Filed Weights   01/05/17 1112  Weight: 89.4 kg (197 lb)    Physical Exam: General: NAD, laying in bed  Head: Normocephalic, atraumatic.  Eyes: Anicteric, EOMI  Nose: Mucous membranes moist, not inflammed, nonerythematous.  Throat: Oropharynx nonerythematous, no exudate appreciated.   Neck: Supple, trachea midline.  Lungs:  Normal respiratory effort. Clear to auscultation BL without crackles or wheezes.  Heart: RRR. S1 and S2 normal without gallop, murmur, or rubs.  Abdomen:  BS normoactive. Soft, Nondistended, non-tender.  No masses or organomegaly.  Extremities: No pretibial edema.  Neurologic: Awake, alert, confused, will follow simple commands  Skin: No visible rashes, scars.    Lab results: Basic Metabolic Panel:  Recent Labs Lab 01/05/17 1107 01/06/17 0419 01/07/17 0502  NA 137 138 137  K 5.1 5.5* 5.4*  CL 108 110 111  CO2 23 22 20*  GLUCOSE 167* 109* 147*  BUN 37* 42* 50*  CREATININE 2.08* 2.50* 2.84*  CALCIUM 8.8* 8.3* 8.4*    Liver Function Tests:  Recent Labs Lab 01/05/17 1107 01/06/17 0419  AST 45* 27  ALT 22 19  ALKPHOS 107 101  BILITOT 0.4 0.5  PROT 6.4* 5.6*  ALBUMIN 3.1* 2.5*   No results for input(s): LIPASE, AMYLASE in the last 168 hours.  Recent Labs Lab 01/07/17 0502   AMMONIA 10    CBC:  Recent Labs Lab 01/05/17 1107 01/06/17 0419 01/07/17 0729  WBC 11.3* 7.8 6.4  NEUTROABS 8.6*  --  4.6  HGB 11.2* 10.1* 9.5*  HCT 33.9* 30.4* 28.9*  MCV 82.8 85.1 85.6  PLT 156 121* 128*    Cardiac Enzymes:  Recent Labs Lab 01/05/17 1709 01/05/17 2214 01/06/17 0419 01/07/17 0729  CKTOTAL  --   --   --  56  TROPONINI 0.07* 0.06* 0.06*  --     BNP: Invalid input(s): POCBNP  CBG:  Recent Labs Lab 01/06/17 1422 01/06/17 1635 01/06/17 2127 01/07/17 0735 01/07/17 1132  GLUCAP 90 124* 155* 131* 169*    Microbiology: Results for orders placed or performed during the hospital encounter of 01/05/17  Blood Culture (routine x 2)     Status: None (Preliminary result)   Collection Time: 01/05/17 11:35 AM  Result Value Ref Range Status   Specimen Description BLOOD BLOOD RIGHT FOREARM  Final   Special Requests   Final    BOTTLES DRAWN AEROBIC AND ANAEROBIC Blood Culture adequate volume   Culture NO GROWTH 2 DAYS  Final   Report Status PENDING  Incomplete  Blood Culture (routine x 2)     Status: None (Preliminary result)   Collection Time: 01/05/17 11:40 AM  Result Value Ref Range Status   Specimen Description BLOOD BLOOD LEFT HAND  Final   Special Requests   Final    BOTTLES DRAWN AEROBIC AND ANAEROBIC Blood Culture adequate volume   Culture NO GROWTH 2 DAYS  Final   Report Status PENDING  Incomplete    Coagulation Studies: No results for input(s): LABPROT, INR in the last 72 hours.  Urinalysis:  Recent Labs  01/05/17 1135  COLORURINE YELLOW*  LABSPEC 1.017  PHURINE 5.0  GLUCOSEU >=500*  HGBUR SMALL*  BILIRUBINUR NEGATIVE  KETONESUR NEGATIVE  PROTEINUR >=300*  NITRITE NEGATIVE  LEUKOCYTESUR NEGATIVE      Imaging: Dg Chest 2 View  Result Date: 01/07/2017 CLINICAL DATA:  Pneumonia. EXAM: CHEST  2 VIEW COMPARISON:  01/05/2017 chest radiograph. FINDINGS: Stable configuration of 2 lead left subclavian pacemaker. Stable  cardiomediastinal silhouette with mild to moderate cardiomegaly and aortic atherosclerosis. No pneumothorax. Small left pleural effusion, slightly decreased. No right pleural effusion. Cephalization of the pulmonary vasculature without overt pulmonary edema. Mild left basilar opacity, slightly decreased. IMPRESSION: 1. Stable cardiomegaly without overt pulmonary edema. 2. Small left pleural effusion, decreased. 3. Mild left basilar lung opacity, slightly decreased, favor atelectasis. Electronically Signed   By: Ilona Sorrel M.D.   On: 01/07/2017 07:59   Ct Head Wo Contrast  Result Date: 01/07/2017 CLINICAL DATA:  Altered mental status, found unresponsive, left-sided weakness EXAM: CT HEAD WITHOUT CONTRAST TECHNIQUE: Contiguous axial images were obtained from the base of the skull through the vertex without intravenous contrast. COMPARISON:  01/05/2017 FINDINGS: Brain: Stable brain atrophy and chronic white matter microvascular ischemic changes throughout both cerebral hemispheres. Stable associated ventricular enlargement. No acute intracranial hemorrhage, mass lesion, definite new infarction, midline shift, herniation, or extra-axial fluid collection. No focal mass effect or edema. Cisterns are patent. Cerebellar atrophy as well. Overall stable exam Vascular: No hyperdense vessel or unexpected calcification. Skull: Normal. Negative for fracture or focal lesion. Sinuses/Orbits: No acute finding. Other: None. IMPRESSION: Stable atrophy and chronic white matter microvascular ischemic changes. No acute intracranial abnormality by noncontrast CT. Electronically Signed   By: Jerilynn Mages.  Shick M.D.   On: 01/07/2017 08:13   Ct Head Wo Contrast  Result Date: 01/05/2017 CLINICAL DATA:  Mental status changes. EXAM: CT HEAD WITHOUT CONTRAST TECHNIQUE: Contiguous axial images were obtained from the base of the skull through the vertex without intravenous contrast. COMPARISON:  02/15/2014 FINDINGS: Brain: There is no evidence  for acute hemorrhage, hydrocephalus, mass lesion, or abnormal extra-axial fluid collection. No definite CT evidence for acute infarction. Diffuse loss of parenchymal volume is consistent with atrophy. Patchy low attenuation in the deep hemispheric and periventricular white matter is nonspecific, but likely reflects chronic microvascular ischemic demyelination. Vascular: No hyperdense vessel or unexpected calcification. Skull: No evidence for fracture. No worrisome lytic or sclerotic lesion. Sinuses/Orbits: The visualized paranasal sinuses and mastoid air cells are clear. Visualized portions of the globes and intraorbital fat are unremarkable. Other: None. IMPRESSION: 1. Stable exam.  No acute intracranial abnormality. 2. Atrophy with chronic small vessel white matter ischemic disease. Electronically Signed   By: Misty Stanley M.D.   On: 01/05/2017 13:54   Dg Chest Port 1 View  Result Date: 01/05/2017 CLINICAL DATA:  Severe sepsis with shock EXAM: PORTABLE CHEST 1 VIEW COMPARISON:  06/18/2016 FINDINGS: Chronic cardiopericardial enlargement. Dual-chamber pacer leads from the left are new from prior, positioned over the right ventricle and right atrium. Hazy peripheral density at the left cardiac apex which could be partially artifactual. Prominent right infrahilar markings but similar to prior. IMPRESSION: 1. No definite pneumonia. There are prominent right infrahilar markings but similar to prior. 2. Artifact versus pleural based opacity at the left base. 3. Suggest follow-up. Electronically Signed   By: Angelica Chessman  Watts M.D.   On: 01/05/2017 13:42   Dg Hip Unilat With Pelvis 2-3 Views Left  Result Date: 01/06/2017 CLINICAL DATA:  71 year old female with altered mental status, found unresponsive. Left hip pain. EXAM: DG HIP (WITH OR WITHOUT PELVIS) 2-3V LEFT COMPARISON:  Abdomen radiograph 06/22/2011. CT Abdomen and Pelvis 05/19/2011. FINDINGS: Femoral heads are normally located. The pelvis appears intact.  Grossly intact proximal right femur. The proximal left femur appears intact. Chronic iliofemoral calcified atherosclerosis. Negative visible bowel gas pattern and pelvic visceral contours. Transitional lumbosacral anatomy. IMPRESSION: No acute osseous abnormality identified about the left hip or pelvis. Electronically Signed   By: Genevie Ann M.D.   On: 01/06/2017 14:48      Assessment & Plan: Pt is a 71 y.o. female with a PMHx of Congestive heart failure, hypertension, hypothyroidism, diabetes mellitus2, nonalcoholic fatty liver disease, obesity, peptic ulcer disease, chronic kidney disease stage IV, who was admitted to [facility] on 01/05/2017 for evaluation of altered mental status.   1. Acute renal failure/chronic kidney disease stage IV baseline creatinine 2.3/proteinuria. The patient has not seen a nephrologist in the past. She does have advanced renal dysfunction at baseline. Her renal function appears to be worse and [place] be related to altered mental status now. Recommend resuming IV fluid hydration with 0.9 normal saline at 50 cc per hour. We will also obtain a renal ultrasound to make sure there is no underlying obstruction and we also recommend checking ANA, SPEP, UPEP.  2. Anemia of chronic kidney disease. Hemoglobin currently 9.5. Hold off on Epogen for now.  3. Hypertension. Continue the patient on amlodipine for now.  4. Altered mental status. Workup as per hospitalist. Consider neurology consultation.

## 2017-01-07 NOTE — Progress Notes (Signed)
[facility] Cardiology [facility] Encounter Note  Patient: Andrea [name] / Admit Date: 01/05/2017 / Date of Encounter: 01/07/2017, 8:12 AM   Subjective: Patient is continuing to have repeat CAT scan for further evaluation of the concerns of mental status changes although slightly improved. No current evidence of congestive heart failure or further concerns of hardy vascular issues primarily causing current symptoms  Review of Systems: Unable to assess  Objective: Telemetry: Paced rhythm Physical Exam: Blood pressure (!) 157/69, pulse 64, temperature 98 F (36.7 C), temperature source Oral, resp. rate (!) 23, height 5\' 3"  (1.6 m), weight 89.4 kg (197 lb), SpO2 100 %. Body mass index is 34.9 kg/m. General: Well developed, well nourished, in no acute distress. Head: Normocephalic, atraumatic, sclera non-icteric, no xanthomas, nares are without discharge. Neck: No apparent masses Lungs: Normal respirations with no wheezes, no rhonchi, no rales , no crackles   Heart: Regular rate and rhythm, normal S1 S2, no murmur, no rub, no gallop, PMI is normal size and placement, carotid upstroke normal without bruit, jugular venous pressure normal Abdomen: Soft, non-tender, non-distended with normoactive bowel sounds. No hepatosplenomegaly. Abdominal aorta is normal size without bruit Extremities: No edema, no clubbing, no cyanosis, no ulcers,  Peripheral: 2+ radial, 2+ femoral, 2+ dorsal pedal pulses Neuro: Not Alert and oriented. Moves all extremities spontaneously. Psych:  Does not Responds to questions appropriately with a normal affect.   Intake/Output Summary (Last 24 hours) at 01/07/17 0812 Last data filed at 01/07/17 0527  Gross per 24 hour  Intake          2810.42 ml  Output                0 ml  Net          2810.42 ml    Inpatient Medications:  . heparin subcutaneous  5,000 Units Subcutaneous Q8H  . insulin aspart  0-5 Units Subcutaneous QHS  . insulin aspart  0-9 Units Subcutaneous  TID WC  . insulin glargine  15 Units Subcutaneous Daily  . levothyroxine  88 mcg Intravenous QAC breakfast  . mouth rinse  15 mL Mouth Rinse BID   Infusions:  . aztreonam Stopped (01/07/17 0527)  . levofloxacin (LEVAQUIN) IV    . vancomycin Stopped (01/06/17 0525)    Labs:  Recent Labs  01/06/17 0419 01/07/17 0502  NA 138 137  K 5.5* 5.4*  CL 110 111  CO2 22 20*  GLUCOSE 109* 147*  BUN 42* 50*  CREATININE 2.50* 2.84*  CALCIUM 8.3* 8.4*    Recent Labs  01/05/17 1107 01/06/17 0419  AST 45* 27  ALT 22 19  ALKPHOS 107 101  BILITOT 0.4 0.5  PROT 6.4* 5.6*  ALBUMIN 3.1* 2.5*    Recent Labs  01/05/17 1107 01/06/17 0419 01/07/17 0729  WBC 11.3* 7.8 6.4  NEUTROABS 8.6*  --  4.6  HGB 11.2* 10.1* 9.5*  HCT 33.9* 30.4* 28.9*  MCV 82.8 85.1 85.6  PLT 156 121* 128*    Recent Labs  01/05/17 1709 01/05/17 2214 01/06/17 0419 01/07/17 0729  CKTOTAL  --   --   --  56  TROPONINI 0.07* 0.06* 0.06*  --    Invalid input(s): POCBNP No results for input(s): HGBA1C in the last 72 hours.   Weights: Filed Weights   01/05/17 1112  Weight: 89.4 kg (197 lb)     Radiology/Studies:  Dg Chest 2 View  Result Date: 01/07/2017 CLINICAL DATA:  Pneumonia. EXAM: CHEST  2 VIEW COMPARISON:  01/05/2017 chest radiograph. FINDINGS: Stable configuration of 2 lead left subclavian pacemaker. Stable cardiomediastinal silhouette with mild to moderate cardiomegaly and aortic atherosclerosis. No pneumothorax. Small left pleural effusion, slightly decreased. No right pleural effusion. Cephalization of the pulmonary vasculature without overt pulmonary edema. Mild left basilar opacity, slightly decreased. IMPRESSION: 1. Stable cardiomegaly without overt pulmonary edema. 2. Small left pleural effusion, decreased. 3. Mild left basilar lung opacity, slightly decreased, favor atelectasis. Electronically Signed   By: Ilona Sorrel M.D.   On: 01/07/2017 07:59   Ct Head Wo Contrast  Result Date:  01/05/2017 CLINICAL DATA:  Mental status changes. EXAM: CT HEAD WITHOUT CONTRAST TECHNIQUE: Contiguous axial images were obtained from the base of the skull through the vertex without intravenous contrast. COMPARISON:  02/15/2014 FINDINGS: Brain: There is no evidence for acute hemorrhage, hydrocephalus, mass lesion, or abnormal extra-axial fluid collection. No definite CT evidence for acute infarction. Diffuse loss of parenchymal volume is consistent with atrophy. Patchy low attenuation in the deep hemispheric and periventricular white matter is nonspecific, but likely reflects chronic microvascular ischemic demyelination. Vascular: No hyperdense vessel or unexpected calcification. Skull: No evidence for fracture. No worrisome lytic or sclerotic lesion. Sinuses/Orbits: The visualized paranasal sinuses and mastoid air cells are clear. Visualized portions of the globes and intraorbital fat are unremarkable. Other: None. IMPRESSION: 1. Stable exam.  No acute intracranial abnormality. 2. Atrophy with chronic small vessel white matter ischemic disease. Electronically Signed   By: Misty Stanley M.D.   On: 01/05/2017 13:54   Dg Chest Port 1 View  Result Date: 01/05/2017 CLINICAL DATA:  Severe sepsis with shock EXAM: PORTABLE CHEST 1 VIEW COMPARISON:  06/18/2016 FINDINGS: Chronic cardiopericardial enlargement. Dual-chamber pacer leads from the left are new from prior, positioned over the right ventricle and right atrium. Hazy peripheral density at the left cardiac apex which could be partially artifactual. Prominent right infrahilar markings but similar to prior. IMPRESSION: 1. No definite pneumonia. There are prominent right infrahilar markings but similar to prior. 2. Artifact versus pleural based opacity at the left base. 3. Suggest follow-up. Electronically Signed   By: Monte Fantasia M.D.   On: 01/05/2017 13:42   Dg Hip Unilat With Pelvis 2-3 Views Left  Result Date: 01/06/2017 CLINICAL DATA:  71 year old  female with altered mental status, found unresponsive. Left hip pain. EXAM: DG HIP (WITH OR WITHOUT PELVIS) 2-3V LEFT COMPARISON:  Abdomen radiograph 06/22/2011. CT Abdomen and Pelvis 05/19/2011. FINDINGS: Femoral heads are normally located. The pelvis appears intact. Grossly intact proximal right femur. The proximal left femur appears intact. Chronic iliofemoral calcified atherosclerosis. Negative visible bowel gas pattern and pelvic visceral contours. Transitional lumbosacral anatomy. IMPRESSION: No acute osseous abnormality identified about the left hip or pelvis. Electronically Signed   By: Genevie Ann M.D.   On: 01/06/2017 14:48     Assessment and Recommendation  71 y.o. female with known diastolic dysfunction heart failure multifactorial in nature stable at this time with chronic kidney disease stage IV essential hypertension makes hyperlipidemia diabetes with complication atrial fibrillation in the past with heart block status post pacemaker placement having mental status changes slightly improved possibly consistent with stroke but no evidence of myocardial infarction or congestive heart failure at this time 1. Continue supportive care of mental status changes and further evaluation for the possibility of stroke or peripheral vascular disease origin 2. No further cardiac diagnostics necessary at this time 3. Continue to follow-up with rehabilitation pacemaker hypertension and other signs or symptoms of possible cardiac issues  Signed, Serafina Royals M.D. FACC

## 2017-01-07 NOTE — Plan of Care (Signed)
Problem: Safety: Goal: Ability to remain free from injury will improve Outcome: Progressing Patient is high fall risk. Yellow arm band and yellow socks are on patient. Bed alarm is on.

## 2017-01-07 NOTE — Progress Notes (Signed)
PT Cancellation Note  Patient Details Name: Andrea [name] MRN: [id number] DOB: 14-Jun-1945   Cancelled Treatment:    Reason Eval/Treat Not Completed: Patient not medically ready.  Pt's potassium noted to be elevated at 5.4 today.  Per PT guidelines for elevated potassium (no orders for telemetry also noted), will hold PT at this time and re-attempt PT eval at a later date/time as medically appropriate.  Leitha Bleak, PT 01/07/17, 4:00 PM [phone]

## 2017-01-08 ENCOUNTER — Inpatient Hospital Stay: Payer: Medicare HMO

## 2017-01-08 ENCOUNTER — Inpatient Hospital Stay
Admit: 2017-01-08 | Discharge: 2017-01-08 | Disposition: A | Discharge status: Home / Self Care | Payer: Medicare HMO | Attending: Internal Medicine | Admitting: Internal Medicine

## 2017-01-08 LAB — PROTEIN ELECTRO, RANDOM URINE
Albumin ELP, Urine: 66.2 %
Alpha-1-Globulin, U: 1.9 %
Alpha-2-Globulin, U: 8.6 %
Beta Globulin, U: 10.4 %
Gamma Globulin, U: 12.9 %
Total Protein, Urine: 314.8 mg/dL

## 2017-01-08 LAB — GLUCOSE, CAPILLARY
Glucose-Capillary: 185 mg/dL — ABNORMAL HIGH (ref 65–99)
Glucose-Capillary: 129 mg/dL — ABNORMAL HIGH (ref 65–99)
Glucose-Capillary: 187 mg/dL — ABNORMAL HIGH (ref 65–99)
Glucose-Capillary: 183 mg/dL — ABNORMAL HIGH (ref 65–99)

## 2017-01-08 LAB — PHOSPHORUS: Phosphorus: 4.4 mg/dL (ref 2.5–4.6)

## 2017-01-08 LAB — BASIC METABOLIC PANEL
ANION GAP: 3 — AB (ref 5–15)
BUN: 54 mg/dL — AB (ref 6–20)
CALCIUM: 8.3 mg/dL — AB (ref 8.9–10.3)
CO2: 21 mmol/L — AB (ref 22–32)
Chloride: 114 mmol/L — ABNORMAL HIGH (ref 101–111)
Creatinine, Ser: 2.41 mg/dL — ABNORMAL HIGH (ref 0.44–1.00)
GFR calc Af Amer: 22 mL/min — ABNORMAL LOW (ref >60)
GFR calc non Af Amer: 19 mL/min — ABNORMAL LOW (ref >60)
GLUCOSE: 251 mg/dL — AB (ref 65–99)
Potassium: 4.9 mmol/L (ref 3.5–5.1)
Sodium: 138 mmol/L (ref 135–145)

## 2017-01-08 LAB — ECHOCARDIOGRAM COMPLETE
Height: 63 in
Weight: 3152 [oz_av]

## 2017-01-08 MED ORDER — FUROSEMIDE 40 MG PO TABS
40.0000 mg | ORAL_TABLET | Freq: Every day | ORAL | Status: DC
  Administered 2017-01-08 – 2017-01-11 (×4): 40 mg via ORAL
  Filled 2017-01-08 (×4): qty 1

## 2017-01-08 MED ORDER — LEVOFLOXACIN IN D5W 750 MG/150ML IV SOLN
750.0000 mg | INTRAVENOUS | Status: DC
  Filled 2017-01-08: qty 150

## 2017-01-08 MED ORDER — INSULIN GLARGINE 300 UNIT/ML ~~LOC~~ SOPN: 25.0000 [IU] | PEN_INJECTOR | Freq: Every day | SUBCUTANEOUS | 0 refills | Status: DC

## 2017-01-08 NOTE — Care Management Important Message (Signed)
Important Message  Patient Details  Name: Andrea [name] MRN: [id number] Date of Birth: April 04, 1946   Medicare Important Message Given:  Yes    Shelbie Ammons, RN 01/08/2017, 8:15 AM

## 2017-01-08 NOTE — Clinical Social Work Note (Signed)
Patient is alert and oriented x4, patient's husband was at bedside, PT and OT saw patient and are recommending SNF placement.  CSW met with patient and her husband and they have agreed to going to SNF for short term rehab.  CSW to begin bed search in [facility] per patient and her husband's request.  Jones Broom. Norval Morton, MSW, [place]  01/08/2017 4:57 PM

## 2017-01-08 NOTE — Progress Notes (Signed)
*  PRELIMINARY RESULTS* Echocardiogram 2D Echocardiogram has been performed.  Andrea [name] 01/08/2017, 9:36 AM

## 2017-01-08 NOTE — NC FL2 (Signed)
[place] LEVEL OF CARE SCREENING TOOL     IDENTIFICATION  Patient Name: Andrea [name] Birthdate: Mar 26, 1946 Sex: female Admission Date (Current Location): 01/05/2017  [place] and [place] Number:  Engineering geologist and Address:  [facility], [address] [place], [place] [address]      Provider Number: [id number]  Attending Physician Name and Address:  Hillary Bow, MD  Relative Name and Phone Number:       Current Level of Care: Hospital Recommended Level of Care: [place] Prior Approval Number:    Date Approved/Denied:   PASRR Number:  [id number])  Discharge Plan: SNF    Current Diagnoses: Patient Active Problem List   Diagnosis Date Noted  . Altered mental status   . Encephalopathy acute 01/05/2017  . CHF (congestive heart failure) ([place]) 06/18/2016  . GERD (gastroesophageal reflux disease) 03/07/2013  . Other and unspecified hyperlipidemia 03/07/2013  . Dizziness and giddiness 11/21/2012  . Urinary incontinence 09/11/2012  . Chronic low back pain 07/30/2012  . Anemia 07/30/2012  . Noncompliance 07/30/2012  . Neuropathy 01/12/2012  . Carotid stenosis, right 11/29/2011  . Diabetes mellitus type 2, uncontrolled ([place]) 11/29/2011  . Hypertension 11/29/2011  . Renal artery stenosis ([place]) 06/04/2011  . Constipation, chronic 06/04/2011  . Hypothyroidism 04/11/2011    Orientation RESPIRATION BLADDER Height & Weight     Self, Place  O2 (1 Liter Oxygen ) Incontinent Weight: 197 lb (89.4 kg) Height:  5\' 3"  (160 cm)  BEHAVIORAL SYMPTOMS/MOOD NEUROLOGICAL BOWEL NUTRITION STATUS      Incontinent Diet (Dysphagia level 2 (minced, well-cut foods, moistened) d/t missing most dentition and no dentures; Thin liquids. General aspiration precautions; Reflux precautions. )  AMBULATORY STATUS COMMUNICATION OF NEEDS Skin   Extensive Assist Verbally Normal                       Personal Care Assistance Level  of Assistance  Bathing, Feeding, Dressing Bathing Assistance: Limited assistance Feeding assistance: Independent Dressing Assistance: Limited assistance     Functional Limitations Info  Sight, Hearing, Speech Sight Info: Adequate Hearing Info: Adequate Speech Info: Adequate    SPECIAL CARE FACTORS FREQUENCY  PT (By licensed PT), OT (By licensed OT)     PT Frequency:  (5) OT Frequency:  (5)            Contractures      Additional Factors Info  Code Status, Allergies Code Status Info:  (Full Code. ) Allergies Info:  (Watermelon Concentrate, Ace Inhibitors, Celebrex Celecoxib, Penicillins, Sulfa Antibiotics)           Current Medications (01/08/2017):  This is the current hospital active medication list Current Facility-Administered Medications  Medication Dose Route Frequency Provider Last Rate Last Dose  . acetaminophen (TYLENOL) tablet 650 mg  650 mg Oral Q6H PRN Gouru, Aruna, MD   650 mg at 01/07/17 2020   Or  . acetaminophen (TYLENOL) suppository 650 mg  650 mg Rectal Q6H PRN Gouru, Aruna, MD      . amLODipine (NORVASC) tablet 10 mg  10 mg Oral Daily Hillary Bow, MD   10 mg at 01/08/17 7893  . apixaban (ELIQUIS) tablet 2.5 mg  2.5 mg Oral BID Hillary Bow, MD   2.5 mg at 01/08/17 8101  . atorvastatin (LIPITOR) tablet 20 mg  20 mg Oral q1800 Hillary Bow, MD   20 mg at 01/07/17 1754  . carvedilol (COREG) tablet 12.5 mg  12.5 mg Oral  BID WC Hillary Bow, MD   12.5 mg at 01/08/17 1058  . clopidogrel (PLAVIX) tablet 75 mg  75 mg Oral Daily Sudini, Alveta Heimlich, MD   75 mg at 01/08/17 3291  . furosemide (LASIX) tablet 40 mg  40 mg Oral Daily Hillary Bow, MD   40 mg at 01/08/17 1222  . insulin aspart (novoLOG) injection 0-5 Units  0-5 Units Subcutaneous QHS Gouru, Aruna, MD      . insulin aspart (novoLOG) injection 0-9 Units  0-9 Units Subcutaneous TID WC Gouru, Aruna, MD   1 Units at 01/08/17 1221  . insulin glargine (LANTUS) injection 15 Units  15 Units Subcutaneous  Daily Nicholes Mango, MD   15 Units at 01/08/17 0836  . ipratropium-albuterol (DUONEB) 0.5-2.5 (3) MG/3ML nebulizer solution 3 mL  3 mL Nebulization Q4H PRN Lance Coon, MD   3 mL at 01/07/17 2215  . [START ON 01/09/2017] levofloxacin (LEVAQUIN) IVPB 750 mg  750 mg Intravenous Q48H Nance Pear, MD      . levothyroxine (SYNTHROID, LEVOTHROID) tablet 150 mcg  150 mcg Oral QAC breakfast Hillary Bow, MD   150 mcg at 01/08/17 [id number]  . MEDLINE mouth rinse  15 mL Mouth Rinse BID Hillary Bow, MD   15 mL at 01/07/17 2140  . metoprolol tartrate (LOPRESSOR) injection 5 mg  5 mg Intravenous Q4H PRN Gouru, Aruna, MD      . ondansetron (ZOFRAN) tablet 4 mg  4 mg Oral Q6H PRN Gouru, Aruna, MD       Or  . ondansetron (ZOFRAN) injection 4 mg  4 mg Intravenous Q6H PRN Gouru, Aruna, MD      . PARoxetine (PAXIL) tablet 40 mg  40 mg Oral Daily Hillary Bow, MD   40 mg at 01/08/17 0600     Discharge Medications: Please see discharge summary for a list of discharge medications.  Relevant Imaging Results:  Relevant Lab Results:   Additional Information  (SSN: [id number])  Ziasia Lenoir, Veronia Beets, LCSW

## 2017-01-08 NOTE — Plan of Care (Signed)
Problem: Safety: Goal: Ability to remain free from injury will improve Outcome: Progressing Patient needed to be redirected to remain in the bed, and to call for help if needed  Problem: Education: Goal: Knowledge of disease or condition will improve Outcome: Not Progressing Patient needs reinforcement for current health status. Family is supportive and voices understanding   Problem: Respiratory: Goal: Respiratory status will improve Outcome: Progressing Patient received PRN breathing treatment this shift for stated shortness of breath. Patient needed to be reminded to maintain [place] for O2 delivery

## 2017-01-08 NOTE — Evaluation (Signed)
Occupational Therapy Evaluation Patient Details Name: Andrea [name] MRN: [id number] DOB: 1945/09/20 Today's Date: 01/08/2017    History of Present Illness Pt is a 71 y.o. female presenting to hospital with decreased responsiveness.  Pt admitted to hospital with acute encephalopathy, AKI with hyperkalemia, and L LL PNA.  Pt with neuro c/s during hospitalization d/t possible R sided weakness (unable to get MRI); pt also with myoclonic jerks 01/06/17 but resolved.  PMH includes 2-3 L home O2, IDDM, CHF, chronic back pain, htn, PUD, NASH, pacemaker.   Clinical Impression   Pt seen for OT evaluation this date. At baseline, pt slightly limited by chronic back pain and poor activity tolerance, requiring assist from spouse for tub transfers but otherwise able to complete basic self care tasks independently. Spouse does majority of cooking and cleaning, as pt does not have the energy. Pt/spouse report several falls in past 12 months, 3 occurring in 1 night as pt rolled out of bed. Educated pt/spouse on benefit of a bed rail to minimize risk of falls. Pt presents with deficits in strength, safety awareness, activity tolerance, cardiopulmonary status, and knowledge of AE/DME and will benefit from skilled OT services to address noted impairments and functional deficits in functional mobility and self care tasks in order to maximize return to PLOF and minimize risk of future falls/injury/rehospitalization/increased caregiver burden. Recommend STR following hospitalization prior to return home.    Follow Up Recommendations  SNF    Equipment Recommendations  Other (comment) (bed rail)    Recommendations for Other Services       Precautions / Restrictions Precautions Precautions: Fall Precaution Comments: Aspiration Restrictions Weight Bearing Restrictions: No      Mobility Bed Mobility   General bed mobility comments: deferred, pt up in recliner for session  Transfers Overall transfer level: Needs  assistance Equipment used: Rolling walker (2 wheeled) Transfers: Sit to/from Stand Sit to Stand: Mod assist         General transfer comment: mod assist for sit to stand with RW with vc for hand placement and to shift weight forward    Balance Overall balance assessment: History of Falls;Needs assistance Sitting-balance support: Feet supported;No upper extremity supported Sitting balance-Leahy Scale: Fair Postural control: Posterior lean Standing balance support: Bilateral upper extremity supported Standing balance-Leahy Scale: Poor Standing balance comment: posterior lean; requires UE support on RW                           ADL either performed or assessed with clinical judgement   ADL Overall ADL's : Needs assistance/impaired Eating/Feeding: Sitting;Set up   Grooming: Sitting;Set up   Upper Body Bathing: Sitting;Set up;Supervision/ safety   Lower Body Bathing: Sitting/lateral leans;Sit to/from stand;Moderate assistance   Upper Body Dressing : Sitting;Set up;Supervision/safety   Lower Body Dressing: Sitting/lateral leans;Sit to/from stand;Moderate assistance   Toilet Transfer: Moderate assistance;Stand-pivot;BSC Toilet Transfer Details (indicate cue type and reason): VC for hand placement/safety with RW                 Vision Baseline Vision/History: No visual deficits Patient Visual Report: No change from baseline Vision Assessment?: No apparent visual deficits     Perception     Praxis      Pertinent Vitals/Pain Pain Assessment: Faces Faces Pain Scale: Hurts a little bit Pain Location: both knees during brief static standing Pain Descriptors / Indicators: Sore Pain Intervention(s): Limited activity within patient's tolerance;Monitored during session;Repositioned     Hand Dominance  Extremity/Trunk Assessment Upper Extremity Assessment Upper Extremity Assessment: Generalized weakness (grossly 4-/5 bilaterally, good- grip strength)    Lower Extremity Assessment Lower Extremity Assessment: Generalized weakness;Defer to PT evaluation   Cervical / Trunk Assessment Cervical / Trunk Assessment: Normal   Communication Communication Communication: No difficulties   Cognition Arousal/Alertness: Awake/alert Behavior During Therapy: WFL for tasks assessed/performed Overall Cognitive Status:  (grossly WFL, increased time to initiate, verbalize. slight impaired safety awareness)                                 General Comments: Delayed reactions and verbalization   General Comments  Pt's husband present during part of session.    Exercises Other Exercises Other Exercises: pt/spouse educated in DME including a bed rail to help support decreased falls risk and using a BSC beside the bed overnight to minimize risk of falls, as pt gets up to use bathroom a couple times each time. Pt/spouse verbalized understanding.   Shoulder Instructions      Home Living Family/patient expects to be discharged to:: Private residence Living Arrangements: Spouse/significant other Available Help at Discharge: Family Type of Home: House Home Access: Stairs to enter [employer] of Steps: 5 Entrance Stairs-Rails: Right;Left;Can reach both Home Layout: One level     Bathroom Shower/Tub: Tub/shower unit;Walk-in shower;Door   Bathroom Toilet: Handicapped height (BSC over toilet) Bathroom Accessibility: No   Home Equipment: Clinical cytogeneticist - 2 wheels;Walker - 4 wheels;Cane - single point;Grab bars - tub/shower;Bedside commode          Prior Functioning/Environment Level of Independence: Needs assistance  Gait / Transfers Assistance Needed: Pt ambulates without AD normally.   ADL's / Homemaking Assistance Needed: Generally indep with basic self care tasks, min guard from spouse for tub transfers, 2-3LO2 at home. Unable to tolerate meal prep or cleaning due to fatigue and chronic pain.   Comments: 3 falls OOB  about 1 week ago. She was asleep and just rolled out of bed. Spouse reports 3x in one night.         OT Problem List: Decreased strength;Decreased cognition;Pain;Cardiopulmonary status limiting activity;Decreased activity tolerance;Decreased safety awareness;Decreased knowledge of use of DME or AE;Impaired balance (sitting and/or standing)      OT Treatment/Interventions: Self-care/ADL training;Therapeutic exercise;Therapeutic activities;Energy conservation;DME and/or AE instruction;Patient/family education    OT Goals(Current goals can be found in the care plan section) Acute Rehab OT Goals Patient Stated Goal: to improve strength and endurance OT Goal Formulation: With patient/family Time For Goal Achievement: 01/22/17 Potential to Achieve Goals: Good  OT Frequency: Min 1X/week   Barriers to D/C: Inaccessible home environment          Co-evaluation              AM-PAC PT "6 Clicks" Daily Activity     Outcome Measure Help from another person eating meals?: None Help from another person taking care of personal grooming?: None Help from another person toileting, which includes using toliet, bedpan, or urinal?: A Lot Help from another person bathing (including washing, rinsing, drying)?: A Lot Help from another person to put on and taking off regular upper body clothing?: A Little Help from another person to put on and taking off regular lower body clothing?: A Lot 6 Click Score: 17   End of Session Equipment Utilized During Treatment: Gait belt;Rolling walker  Activity Tolerance: Patient tolerated treatment well Patient left: with call bell/phone within reach;in chair;with chair alarm  set;with family/visitor present  OT Visit Diagnosis: Other abnormalities of gait and mobility (R26.89);Muscle weakness (generalized) (M62.81);Repeated falls (R29.6);Other symptoms and signs involving cognitive function                Time: 1542-1601 OT Time Calculation (min): 19  min Charges:  OT General Charges $OT Visit: 1 Visit OT Evaluation $OT Eval Low Complexity: 1 Low G-Codes: OT G-codes **NOT FOR INPATIENT CLASS** Functional Assessment Tool Used: AM-PAC 6 Clicks Daily Activity;Clinical judgement Functional Limitation: Self care Self Care Current Status (X4128): At least 40 percent but less than 60 percent impaired, limited or restricted Self Care Goal Status (N8676): At least 20 percent but less than 40 percent impaired, limited or restricted   Jeni Salles, MPH, MS, OTR/L ascom [phone] 01/08/17, 4:24 PM

## 2017-01-08 NOTE — Clinical Social Work Placement (Addendum)
   CLINICAL SOCIAL WORK PLACEMENT  NOTE  Date:  01/08/2017  Patient Details  Name: Andrea [name] MRN: [id number] Date of Birth: [birth year]  Clinical Social Work is seeking post-discharge placement for this patient at the [place] level of care (*CSW will initial, date and re-position this form in  chart as items are completed):  Yes   Patient/family provided with [place] Work Department's list of facilities offering this level of care within the geographic area requested by the patient (or if unable, by the patient's family).  Yes   Patient/family informed of their freedom to choose among providers that offer the needed level of care, that participate in Medicare, Medicaid or managed care program needed by the patient, have an available bed and are willing to accept the patient.  Yes   Patient/family informed of [place]'s ownership interest in [facility] and [facility], as well as of the fact that they are under no obligation to receive care at these facilities.  PASRR submitted to EDS on 01/08/17     PASRR number received on       Existing PASRR number confirmed on 01/08/17     FL2 transmitted to all facilities in geographic area requested by pt/family on 01/08/17     FL2 transmitted to all facilities within larger geographic area on       Patient informed that his/her managed care company has contracts with or will negotiate with certain facilities, including the following:         01-09-17   Patient/family informed of bed offers received. (updated Evette Cristal, MSW, [place], 01-10-17)  Patient chooses bed at  [facility] (updated Evette Cristal, MSW, [place], 01-10-17)     Physician recommends and patient chooses bed at      Patient to be transferred to  [facility] on  01-10-17. (updated Evette Cristal, MSW, [place], 01-10-17)  Patient to be transferred to facility by  [facility] (updated Evette Cristal, MSW, [place],  01-10-17)     Patient family notified on  01-10-17 of transfer. (updated Evette Cristal, MSW, [place], 01-10-17)  Name of family member notified:   Husband Marcello Moores at bedside (updated Evette Cristal, MSW, [place], 01-10-17)    PHYSICIAN Please sign FL2     Additional Comment:    _______________________________________________ Ross Ludwig, LCSWA 01/08/2017, 5:26 PM  (updated Evette Cristal, MSW, [place], 01-10-17)

## 2017-01-08 NOTE — Progress Notes (Signed)
[facility] [place] NAME: Andrea [name]    MR#:  [id number]  DATE OF BIRTH:  [birth year]  SUBJECTIVE:  CHIEF COMPLAINT:   Chief Complaint  Patient presents with  . Loss of Consciousness   Awake and alert. Back to baseline. Husband at bedside. Eating well. Still weak. Work with physical therapy. REVIEW OF SYSTEMS:    Review of Systems  Constitutional: Positive for malaise/fatigue. Negative for chills and fever.  HENT: Negative for sore throat.   Eyes: Negative for blurred vision, double vision and pain.  Respiratory: Negative for cough, hemoptysis, shortness of breath and wheezing.   Cardiovascular: Negative for chest pain, palpitations, orthopnea and leg swelling.  Gastrointestinal: Negative for abdominal pain, constipation, diarrhea, heartburn, nausea and vomiting.  Genitourinary: Negative for dysuria and hematuria.  Musculoskeletal: Negative for back pain and joint pain.  Skin: Negative for rash.  Neurological: Positive for weakness. Negative for sensory change, speech change, focal weakness and headaches.  Endo/Heme/Allergies: Does not bruise/bleed easily.  Psychiatric/Behavioral: Negative for depression. The patient is not nervous/anxious.     DRUG ALLERGIES:   Allergies  Allergen Reactions  . Watermelon Concentrate Anaphylaxis  . Ace Inhibitors   . Celebrex [Celecoxib] Rash  . Penicillins Rash  . Sulfa Antibiotics Rash    VITALS:  Blood pressure (!) 174/67, pulse 62, temperature 98.5 F (36.9 C), temperature source Oral, resp. rate 18, height 5\' 3"  (1.6 m), weight 89.4 kg (197 lb), SpO2 100 %.  PHYSICAL EXAMINATION:   Physical Exam  GENERAL:  71 y.o.-year-old patient lying in the bed with no acute distress.  EYES: Pupils equal, round, reactive to light and accommodation. No scleral icterus. Extraocular muscles intact.  HEENT: Head atraumatic, normocephalic. Oropharynx and nasopharynx clear.  NECK:  Supple, no jugular  venous distention. No thyroid enlargement, no tenderness.  LUNGS: Normal breath sounds bilaterally, no wheezing, rales, rhonchi. No use of accessory muscles of respiration.  CARDIOVASCULAR: S1, S2 normal. No murmurs, rubs, or gallops.  ABDOMEN: Soft, nontender, nondistended. Bowel sounds present. No organomegaly or mass.  EXTREMITIES: No cyanosis, clubbing or edema b/l.    NEUROLOGIC: Cranial nerves II through XII are intact. Left lower extremity weakness. PSYCHIATRIC: The patient is alert and Awake. Oriented to place. SKIN: No obvious rash, lesion, or ulcer.   LABORATORY PANEL:   CBC  Recent Labs Lab 01/07/17 0729  WBC 6.4  HGB 9.5*  HCT 28.9*  PLT 128*   ------------------------------------------------------------------------------------------------------------------ Chemistries   Recent Labs Lab 01/06/17 0419  01/08/17 0415  NA 138  < > 138  K 5.5*  < > 4.9  CL 110  < > 114*  CO2 22  < > 21*  GLUCOSE 109*  < > 251*  BUN 42*  < > 54*  CREATININE 2.50*  < > 2.41*  CALCIUM 8.3*  < > 8.3*  AST 27  --   --   ALT 19  --   --   ALKPHOS 101  --   --   BILITOT 0.5  --   --   < > = values in this interval not displayed. ------------------------------------------------------------------------------------------------------------------  Cardiac Enzymes  Recent Labs Lab 01/06/17 0419  TROPONINI 0.06*   ------------------------------------------------------------------------------------------------------------------  RADIOLOGY:  Dg Chest 2 View  Result Date: 01/07/2017 CLINICAL DATA:  Pneumonia. EXAM: CHEST  2 VIEW COMPARISON:  01/05/2017 chest radiograph. FINDINGS: Stable configuration of 2 lead left subclavian pacemaker. Stable cardiomediastinal silhouette with mild to moderate cardiomegaly and aortic atherosclerosis. No pneumothorax.  Small left pleural effusion, slightly decreased. No right pleural effusion. Cephalization of the pulmonary vasculature without overt  pulmonary edema. Mild left basilar opacity, slightly decreased. IMPRESSION: 1. Stable cardiomegaly without overt pulmonary edema. 2. Small left pleural effusion, decreased. 3. Mild left basilar lung opacity, slightly decreased, favor atelectasis. Electronically Signed   By: Ilona Sorrel M.D.   On: 01/07/2017 07:59   Ct Head Wo Contrast  Result Date: 01/07/2017 CLINICAL DATA:  Altered mental status, found unresponsive, left-sided weakness EXAM: CT HEAD WITHOUT CONTRAST TECHNIQUE: Contiguous axial images were obtained from the base of the skull through the vertex without intravenous contrast. COMPARISON:  01/05/2017 FINDINGS: Brain: Stable brain atrophy and chronic white matter microvascular ischemic changes throughout both cerebral hemispheres. Stable associated ventricular enlargement. No acute intracranial hemorrhage, mass lesion, definite new infarction, midline shift, herniation, or extra-axial fluid collection. No focal mass effect or edema. Cisterns are patent. Cerebellar atrophy as well. Overall stable exam Vascular: No hyperdense vessel or unexpected calcification. Skull: Normal. Negative for fracture or focal lesion. Sinuses/Orbits: No acute finding. Other: None. IMPRESSION: Stable atrophy and chronic white matter microvascular ischemic changes. No acute intracranial abnormality by noncontrast CT. Electronically Signed   By: Jerilynn Mages.  Shick M.D.   On: 01/07/2017 08:13   US Renal  Result Date: 01/08/2017 CLINICAL DATA:  Acute renal insufficiency. Known left renal atrophy. EXAM: RENAL / URINARY TRACT ULTRASOUND COMPLETE COMPARISON:  10.8 cm FINDINGS: Right Kidney: Length: 10.8 cm. The renal cortical echotexture is greater than that of the adjacent liver. There is no hydronephrosis nor focal mass. No stones are evident. Left Kidney: Length: 6.8 cm. The cortical echoetexture is increased. There is diffuse cortical thinning. There is no hydronephrosis nor cystic or solid mass observed. Bladder: The partially  distended urinary bladder is normal. IMPRESSION: Chronic left renal atrophy. Increased renal cortical echotexture bilaterally consistent with medical renal disease. There is no hydronephrosis. The urinary bladder is unremarkable. Electronically Signed   By: David  Martinique M.D.   On: 01/08/2017 10:31   US Carotid Bilateral  Result Date: 01/08/2017 CLINICAL DATA:  CVA. EXAM: BILATERAL CAROTID DUPLEX ULTRASOUND TECHNIQUE: Pearline Cables scale imaging, color Doppler and duplex ultrasound were performed of bilateral carotid and vertebral arteries in the neck. COMPARISON:  CT 01/07/2017.  CT 11/16/2011. FINDINGS: Criteria: Quantification of carotid stenosis is based on velocity parameters that correlate the residual internal carotid diameter with NASCET-based stenosis levels, using the diameter of the distal internal carotid lumen as the denominator for stenosis measurement. Exam very limited due to patient's body habitus and motion. The following velocity measurements were obtained: RIGHT ICA:  30/11 cm/sec CCA:  67/12 cm/sec SYSTOLIC ICA/CCA RATIO:  0.6 DIASTOLIC ICA/CCA RATIO:  1.0 ECA:  64 cm/sec LEFT ICA:  61/12 cm/sec CCA:  45/80 cm/sec SYSTOLIC ICA/CCA RATIO:  0.7 DIASTOLIC ICA/CCA RATIO:  0.7 ECA:  57 cm/sec RIGHT CAROTID ARTERY: Mild multifocal atherosclerotic vascular plaque. Prominent calcific plaque right carotid bifurcation noted on prior CT of 11/16/2011 no longer identified. RIGHT VERTEBRAL ARTERY:  Patent with antegrade flow. LEFT CAROTID ARTERY: Mild multifocal atherosclerotic vascular plaque. No flow limiting stenosis. LEFT VERTEBRAL ARTERY:  Patent with antegrade flow. IMPRESSION: 1. Mild bilateral multifocal atherosclerotic vascular plaque. No flow limiting stenosis. Degree of stenosis less than 50% bilaterally. 2. Vertebrals are patent antegrade flow. Exam very limited due to patient's body habitus and motion. Electronically Signed   By: Marcello Moores  Register   On: 01/08/2017 10:48     ASSESSMENT AND PLAN:    Andrea [name]  is a 71 y.o. female with a known history of congestive heart failure, hypertension, hypothyroidism, insulin requiring diabetes mellitus and other medical problems is brought into the ED for altered mental status by her husband. Patient was found to be unresponsive at 9:30 AM today and patient was in her usual state of health until last night.Husband is at bedside and providing history   # Acute encephalopathy likely due to CVA due to some left sided weakness CT head showed no CVA. Repeat CT head ordered today shows no stroke. Cannot get MRI Due to pacemaker  Symptoms are improving.  Carotid Dopplers and echocardiogram showed nothing acute  Patient is on Eliquis due to history of atrial fibrillation.  # Acute kidney injury with hyperkalemia. Improved with IV fluids. Stop IV fluids today due to history of CHF.  # Left lower lobe pneumonia - improved on chest x-ray. No fever or cough. Normal WBC.  But does have elevated pro calcitonin and no other source of infection. We'll continue to treat with  antibiotics.  # Insulin requiring diabetes mellitus Poor oral intake. On reduced dose of Lantus and sliding scale insulin.  # Hypothyroidism On levothyroxine  # Mild elevation in troponin. Seen by cardiology. No further investigations needed.  # Hypertension, uncontrolled. Improved with her home medications.  Patient worked with physical therapy. Need skilled nursing facility. Consult occupational therapy. Likely discharge tomorrow.  All the records are reviewed and case discussed with Care Management/Social Worker Management plans discussed with the patient, family and they are in agreement.  CODE STATUS: FULL CODE  DVT Prophylaxis: SCDs  TOTAL TIME TAKING CARE OF THIS PATIENT: 35 minutes.   POSSIBLE D/C IN 1-2 DAYS, DEPENDING ON CLINICAL CONDITION.  Hillary Bow R M.D on 01/08/2017 at 4:06 PM  Between 7am to 6pm - Pager - [phone]  After 6pm go to  [email or web address] - password [email or web address] [facility]  Office  [phone]  CC: Primary care physician; Patient, No Pcp Per  Note: This dictation was prepared with Dragon dictation along with smaller phrase technology. Any transcriptional errors that result from this process are unintentional.

## 2017-01-08 NOTE — Progress Notes (Signed)
[facility]  ROUNDING NOTE   Subjective:  Patient seen at bedside. Feeling better. Creatinine down to 2.4. Patient has a very atrophic left kidney.   Objective:  Vital signs in last 24 hours:  Temp:  [97.6 F (36.4 C)-98.6 F (37 C)] 98.6 F (37 C) (08/28 0824) Pulse Rate:  [64-72] 64 (08/28 1055) Resp:  [16-18] 18 (08/28 1055) BP: (135-167)/(63-87) 167/74 (08/28 1055) SpO2:  [91 %-100 %] 100 % (08/28 1055)  Weight change:  Filed Weights   01/05/17 1112  Weight: 89.4 kg (197 lb)    Intake/Output: I/O last 3 completed shifts: In: 2156.7 [P.O.:840; I.V.:1216.7; IV Piggyback:100] Out: 175 [Urine:175]   Intake/Output this shift:  No intake/output data recorded.  Physical Exam: General: No acute distress  Head: Normocephalic, atraumatic. Moist oral mucosal membranes  Eyes: Anicteric  Neck: Supple, trachea midline  Lungs:  Clear to auscultation, normal effort  Heart: S1S2 no rubs  Abdomen:  Soft, nontender, bowel sounds present  Extremities: No peripheral edema.  Neurologic: Awake, alert, Confused at times   Skin: No lesions       Basic Metabolic Panel:  Recent Labs Lab 01/05/17 1107 01/06/17 0419 01/07/17 0502 01/08/17 0415  NA 137 138 137 138  K 5.1 5.5* 5.4* 4.9  CL 108 110 111 114*  CO2 23 22 20* 21*  GLUCOSE 167* 109* 147* 251*  BUN 37* 42* 50* 54*  CREATININE 2.08* 2.50* 2.84* 2.41*  CALCIUM 8.8* 8.3* 8.4* 8.3*  PHOS  --   --   --  4.4    Liver Function Tests:  Recent Labs Lab 01/05/17 1107 01/06/17 0419  AST 45* 27  ALT 22 19  ALKPHOS 107 101  BILITOT 0.4 0.5  PROT 6.4* 5.6*  ALBUMIN 3.1* 2.5*   No results for input(s): LIPASE, AMYLASE in the last 168 hours.  Recent Labs Lab 01/07/17 0502  AMMONIA 10    CBC:  Recent Labs Lab 01/05/17 1107 01/06/17 0419 01/07/17 0729  WBC 11.3* 7.8 6.4  NEUTROABS 8.6*  --  4.6  HGB 11.2* 10.1* 9.5*  HCT 33.9* 30.4* 28.9*  MCV 82.8 85.1 85.6  PLT 156 121* 128*     Cardiac Enzymes:  Recent Labs Lab 01/05/17 1709 01/05/17 2214 01/06/17 0419 01/07/17 0729  CKTOTAL  --   --   --  56  TROPONINI 0.07* 0.06* 0.06*  --     BNP: Invalid input(s): POCBNP  CBG:  Recent Labs Lab 01/07/17 0735 01/07/17 1132 01/07/17 1722 01/07/17 2058 01/08/17 0732  GLUCAP 131* 169* 154* 155* 185*    Microbiology: Results for orders placed or performed during the hospital encounter of 01/05/17  Blood Culture (routine x 2)     Status: None (Preliminary result)   Collection Time: 01/05/17 11:35 AM  Result Value Ref Range Status   Specimen Description BLOOD BLOOD RIGHT FOREARM  Final   Special Requests   Final    BOTTLES DRAWN AEROBIC AND ANAEROBIC Blood Culture adequate volume   Culture NO GROWTH 3 DAYS  Final   Report Status PENDING  Incomplete  Blood Culture (routine x 2)     Status: None (Preliminary result)   Collection Time: 01/05/17 11:40 AM  Result Value Ref Range Status   Specimen Description BLOOD BLOOD LEFT HAND  Final   Special Requests   Final    BOTTLES DRAWN AEROBIC AND ANAEROBIC Blood Culture adequate volume   Culture NO GROWTH 3 DAYS  Final   Report Status PENDING  Incomplete  Coagulation Studies: No results for input(s): LABPROT, INR in the last 72 hours.  Urinalysis:  Recent Labs  01/05/17 1135  COLORURINE YELLOW*  LABSPEC 1.017  PHURINE 5.0  GLUCOSEU >=500*  HGBUR SMALL*  BILIRUBINUR NEGATIVE  KETONESUR NEGATIVE  PROTEINUR >=300*  NITRITE NEGATIVE  LEUKOCYTESUR NEGATIVE      Imaging: Dg Chest 2 View  Result Date: 01/07/2017 CLINICAL DATA:  Pneumonia. EXAM: CHEST  2 VIEW COMPARISON:  01/05/2017 chest radiograph. FINDINGS: Stable configuration of 2 lead left subclavian pacemaker. Stable cardiomediastinal silhouette with mild to moderate cardiomegaly and aortic atherosclerosis. No pneumothorax. Small left pleural effusion, slightly decreased. No right pleural effusion. Cephalization of the pulmonary vasculature  without overt pulmonary edema. Mild left basilar opacity, slightly decreased. IMPRESSION: 1. Stable cardiomegaly without overt pulmonary edema. 2. Small left pleural effusion, decreased. 3. Mild left basilar lung opacity, slightly decreased, favor atelectasis. Electronically Signed   By: Ilona Sorrel M.D.   On: 01/07/2017 07:59   Ct Head Wo Contrast  Result Date: 01/07/2017 CLINICAL DATA:  Altered mental status, found unresponsive, left-sided weakness EXAM: CT HEAD WITHOUT CONTRAST TECHNIQUE: Contiguous axial images were obtained from the base of the skull through the vertex without intravenous contrast. COMPARISON:  01/05/2017 FINDINGS: Brain: Stable brain atrophy and chronic white matter microvascular ischemic changes throughout both cerebral hemispheres. Stable associated ventricular enlargement. No acute intracranial hemorrhage, mass lesion, definite new infarction, midline shift, herniation, or extra-axial fluid collection. No focal mass effect or edema. Cisterns are patent. Cerebellar atrophy as well. Overall stable exam Vascular: No hyperdense vessel or unexpected calcification. Skull: Normal. Negative for fracture or focal lesion. Sinuses/Orbits: No acute finding. Other: None. IMPRESSION: Stable atrophy and chronic white matter microvascular ischemic changes. No acute intracranial abnormality by noncontrast CT. Electronically Signed   By: Jerilynn Mages.  Shick M.D.   On: 01/07/2017 08:13   US Renal  Result Date: 01/08/2017 CLINICAL DATA:  Acute renal insufficiency. Known left renal atrophy. EXAM: RENAL / URINARY TRACT ULTRASOUND COMPLETE COMPARISON:  10.8 cm FINDINGS: Right Kidney: Length: 10.8 cm. The renal cortical echotexture is greater than that of the adjacent liver. There is no hydronephrosis nor focal mass. No stones are evident. Left Kidney: Length: 6.8 cm. The cortical echoetexture is increased. There is diffuse cortical thinning. There is no hydronephrosis nor cystic or solid mass observed. Bladder:  The partially distended urinary bladder is normal. IMPRESSION: Chronic left renal atrophy. Increased renal cortical echotexture bilaterally consistent with medical renal disease. There is no hydronephrosis. The urinary bladder is unremarkable. Electronically Signed   By: David  Martinique M.D.   On: 01/08/2017 10:31   US Carotid Bilateral  Result Date: 01/08/2017 CLINICAL DATA:  CVA. EXAM: BILATERAL CAROTID DUPLEX ULTRASOUND TECHNIQUE: Pearline Cables scale imaging, color Doppler and duplex ultrasound were performed of bilateral carotid and vertebral arteries in the neck. COMPARISON:  CT 01/07/2017.  CT 11/16/2011. FINDINGS: Criteria: Quantification of carotid stenosis is based on velocity parameters that correlate the residual internal carotid diameter with NASCET-based stenosis levels, using the diameter of the distal internal carotid lumen as the denominator for stenosis measurement. Exam very limited due to patient's body habitus and motion. The following velocity measurements were obtained: RIGHT ICA:  30/11 cm/sec CCA:  00/17 cm/sec SYSTOLIC ICA/CCA RATIO:  0.6 DIASTOLIC ICA/CCA RATIO:  1.0 ECA:  64 cm/sec LEFT ICA:  61/12 cm/sec CCA:  49/44 cm/sec SYSTOLIC ICA/CCA RATIO:  0.7 DIASTOLIC ICA/CCA RATIO:  0.7 ECA:  57 cm/sec RIGHT CAROTID ARTERY: Mild multifocal atherosclerotic vascular plaque. Prominent calcific plaque  right carotid bifurcation noted on prior CT of 11/16/2011 no longer identified. RIGHT VERTEBRAL ARTERY:  Patent with antegrade flow. LEFT CAROTID ARTERY: Mild multifocal atherosclerotic vascular plaque. No flow limiting stenosis. LEFT VERTEBRAL ARTERY:  Patent with antegrade flow. IMPRESSION: 1. Mild bilateral multifocal atherosclerotic vascular plaque. No flow limiting stenosis. Degree of stenosis less than 50% bilaterally. 2. Vertebrals are patent antegrade flow. Exam very limited due to patient's body habitus and motion. Electronically Signed   By: Marcello Moores  Register   On: 01/08/2017 10:48   Dg Hip Unilat  With Pelvis 2-3 Views Left  Result Date: 01/06/2017 CLINICAL DATA:  71 year old female with altered mental status, found unresponsive. Left hip pain. EXAM: DG HIP (WITH OR WITHOUT PELVIS) 2-3V LEFT COMPARISON:  Abdomen radiograph 06/22/2011. CT Abdomen and Pelvis 05/19/2011. FINDINGS: Femoral heads are normally located. The pelvis appears intact. Grossly intact proximal right femur. The proximal left femur appears intact. Chronic iliofemoral calcified atherosclerosis. Negative visible bowel gas pattern and pelvic visceral contours. Transitional lumbosacral anatomy. IMPRESSION: No acute osseous abnormality identified about the left hip or pelvis. Electronically Signed   By: Genevie Ann M.D.   On: 01/06/2017 14:48     Medications:   . [START ON 01/09/2017] levofloxacin (LEVAQUIN) IV     . amLODipine  10 mg Oral Daily  . apixaban  2.5 mg Oral BID  . atorvastatin  20 mg Oral q1800  . carvedilol  12.5 mg Oral BID WC  . clopidogrel  75 mg Oral Daily  . insulin aspart  0-5 Units Subcutaneous QHS  . insulin aspart  0-9 Units Subcutaneous TID WC  . insulin glargine  15 Units Subcutaneous Daily  . levothyroxine  150 mcg Oral QAC breakfast  . mouth rinse  15 mL Mouth Rinse BID  . PARoxetine  40 mg Oral Daily   acetaminophen **OR** acetaminophen, ipratropium-albuterol, metoprolol tartrate, ondansetron **OR** ondansetron (ZOFRAN) IV  Assessment/ Plan:  71 y.o. female with a PMHx of Congestive heart failure, hypertension, hypothyroidism, diabetes mellitus2, nonalcoholic fatty liver disease, obesity, peptic ulcer disease, chronic kidney disease stage IV, who was admitted to [facility] on 01/05/2017 for evaluation of altered mental status.   1. Acute renal failure/chronic kidney disease stage IV baseline creatinine 2.3/proteinuria. The patient has not seen a nephrologist in the past. She does have advanced renal dysfunction at baseline.  -  Renal ultrasound reveals an atrophic left kidney of 6.8 cm. Likely  accounting for some element of her underlying chronic kidney disease. Acute renal failure improved with IV fluid hydration. Okay to stop IV fluids now. She will need continued monitoring of her renal function as an outpatient.  2. Anemia of chronic kidney disease. Continue to monitor hemoglobin. No urgent indication for Epogen at the moment however we Toral need to consider this as an outpatient.  3. Hypertension. Continue the patient on amlodipine.  4. Altered mental status.  Patient appears to be a bit more awake and alert today. Continue to monitor mental status.   LOS: 3 Aniayah Alaniz 8/28/201811:29 AM

## 2017-01-08 NOTE — Progress Notes (Signed)
PT Cancellation Note  Patient Details Name: Andrea [name] MRN: [id number] DOB: 10/02/1945   Cancelled Treatment:    Reason Eval/Treat Not Completed: Patient at procedure or test/unavailable.  Upon arrival to pt's room, transport present to take pt off floor for testing.  Will re-attempt PT eval at a later date/time.  Leitha Bleak, PT 01/08/17, 9:41 AM [phone]

## 2017-01-08 NOTE — Clinical Social Work Note (Signed)
Clinical Social Work Assessment  Patient Details  Name: Andrea [name] MRN: [id number] Date of Birth: [birth year]  Date of referral:  01/08/17               Reason for consult:  Facility Placement                Permission sought to share information with:  Facility Sport and exercise psychologist, Family Supports Permission granted to share information::  Yes, Verbal Permission Granted  Name::     Teia, [place] [phone]  [phone]   Agency::  SNF admissions  Relationship::     Contact Information:     Housing/Transportation Living arrangements for the past 2 months:  Single Family Home Source of Information:  Spouse, Patient Patient Interpreter Needed:  None Criminal Activity/Legal Involvement Pertinent to Current Situation/Hospitalization:  No - Comment as needed Significant Relationships:  Spouse Lives with:  Spouse Do you feel safe going back to the place where you live?  No Need for family participation in patient care:  No (Coment)  Care giving concerns:  Patient and her husband feels patient needs to go to SNF for short term rehab before returning back home.  Social Worker assessment / plan:  Patient is a 71 year old female who is married and alert and oriented x4.  Patient lives in a house with her husband.  Patient states she has not been to rehab before, but patient's husband has been to SNF for rehab in the past.  CSW explained to patient and her family the process of trying to find SNF placement and also role of CSW.  Patient was explained how insurance will pay for stay at SNF.  Patient and husband gave CSW permission to begin bed search in [place].  Patient and family did not have any other questions or concerns.  Employment status:  Retired Nurse, adult PT Recommendations:  [place] / Referral to community resources:  [place]  Patient/Family's Response to care:  Patient and family are in  agreement to going to SNF for short term rehab. Patient/Family's Understanding of and Emotional Response to Diagnosis, Current Treatment, and Prognosis:  Patient expressed understanding of going to SNF first before returning back home.  Patient states she would rather go home, but she is hopeful she will not have to be at SNF very long.  Emotional Assessment Appearance:  Appears stated age Attitude/Demeanor/Rapport:    Affect (typically observed):  Calm, Stable Orientation:  Oriented to Self, Oriented to Place, Oriented to  Time, Oriented to Situation Alcohol / Substance use:  Not Applicable Psych involvement (Current and /or in the community):  No (Comment)  Discharge Needs  Concerns to be addressed:  Lack of Support Readmission within the last 30 days:  No Current discharge risk:  Lack of support system Barriers to Discharge:  Ship broker, Continued Medical Work up   [facility], [place] 01/08/2017, 5:14 PM

## 2017-01-08 NOTE — Progress Notes (Signed)
Pharmacy Antibiotic Note  Andrea [name] is a 71 y.o. female admitted on 01/05/2017 with sepsis.  Pharmacy has been consulted for aztreonam, levofloxacin, and vancomycin dosing. In ED she has a temperature of 100.1, decreased responsiveness/AMS, tachypnea, and leukocytosis (11.3).   Plan:  8/25: 1. Aztreonam 1 gm IV Q8H 2. Levofloxacin 750 mg IV Q48H 3. Vancomycin 1 gm IV x 1 in ED followed in approximately 15 hours (stacked dosing) by vancomycin 1.25 gm IV Q36H, predicted trough 17 mcg/mL. Pharmacy will continue to follow and adjust as needed to maintain trough 15 to 20 mcg/mL.  Vd 46.8 L, Ke 0.026 hr-1, T1/2 26.5 hr  8/27: Aztreonam and vancomycin were discontinued. Will continue levofloxacin 500mg  Q48H. Pharmacy will continue to follow and adjust as needed.   8/28: Renal fxn improved. Transition back to Levaquin 750 mg IV Q48h for PNA     Height: 5\' 3"  (160 cm) Weight: 197 lb (89.4 kg) IBW/kg (Calculated) : 52.4  Temp (24hrs), Avg:98.1 F (36.7 C), Min:97.6 F (36.4 C), Max:98.6 F (37 C)   Recent Labs Lab 01/05/17 1107 01/05/17 1136 01/05/17 1709 01/06/17 0419 01/07/17 0502 01/07/17 0729 01/08/17 0415  WBC 11.3*  --   --  7.8  --  6.4  --   CREATININE 2.08*  --   --  2.50* 2.84*  --  2.41*  LATICACIDVEN  --  0.9 0.8  --   --   --   --     Estimated Creatinine Clearance: 22.7 mL/min (A) (by C-G formula based on SCr of 2.41 mg/dL (H)).    Allergies  Allergen Reactions  . Watermelon Concentrate Anaphylaxis  . Ace Inhibitors   . Celebrex [Celecoxib] Rash  . Penicillins Rash  . Sulfa Antibiotics Rash   Microbiology Results: BCx>> NG D2  Thank you for allowing pharmacy to be a part of this patient's care.  Chinita Greenland PharmD Clinical Pharmacist 01/08/2017  01/08/2017 8:49 AM

## 2017-01-08 NOTE — Evaluation (Signed)
Physical Therapy Evaluation Patient Details Name: Andrea [name] MRN: [id number] DOB: February 10, 1946 Today's Date: 01/08/2017   History of Present Illness  Pt is a 71 y.o. female presenting to hospital with decreased responsiveness.  Pt admitted to hospital with acute encephalopathy, AKI with hyperkalemia, and L LL PNA.  Pt with neuro c/s during hospitalization d/t possible R sided weakness (unable to get MRI); pt also with myoclonic jerks 01/06/17 but resolved.  PMH includes 2-3 L home O2, IDDM, CHF, chronic back pain, htn, PUD, NASH, pacemaker.  Clinical Impression  Prior to hospital admission, pt was ambulating without AD.  Pt lives with her husband in 1 level home with 5 STE with B railings.  Currently pt is mod to max assist with bed mobility; fluctuating between min to max assist to stand with RW (posterior lean noted); and CGA to min assist x2 to take a few steps bed to recliner with RW.  Pt also demonstrating posterior lean in sitting requiring vc's and repositioning to correct.  Delayed responses (verbally and via movement) noted as well and difficulty follow directions.  Pt very pleasant during session and cooperative.  Pt would benefit from skilled PT to address noted impairments and functional limitations (see below for any additional details).  Upon hospital discharge, recommend pt discharge to [facility].    Follow Up Recommendations SNF    Equipment Recommendations  Rolling walker with 5" wheels    Recommendations for Other Services OT consult     Precautions / Restrictions Precautions Precautions: Fall Precaution Comments: Aspiration Restrictions Weight Bearing Restrictions: No      Mobility  Bed Mobility Overal bed mobility: Needs Assistance Bed Mobility: Supine to Sit;Sit to Supine     Supine to sit: Max assist Sit to supine: Mod assist   General bed mobility comments: assist for trunk and LE's supine to sit; pt noted to be incontinent of urine in bed (upon sitting up) so NT  called and pt assisted back to bed for clean up (mod to max assist for trunk and B LE's); assist for trunk and B LE's supine to sit after clean-up; vc's for hand placement and technique required; increased time to perform required  Transfers Overall transfer level: Needs assistance Equipment used: Rolling walker (2 wheeled) Transfers: Sit to/from Stand Sit to Stand: Max assist;Mod assist;Min assist         General transfer comment: Pt initially max assist to stand, then mod assist, then min assist to stand; vc's for hand and LE placement required each time; assist to shift weight forward required d/t posterior lean upon standing  Ambulation/Gait Ambulation/Gait assistance: Min guard;Min assist;+2 physical assistance Ambulation Distance (Feet): 3 Feet (bed to recliner) Assistive device: Rolling walker (2 wheeled)   Gait velocity: decreased   General Gait Details: pt requiring increased time and cueing to ambulate to chair (pt sat early upon almost reaching chair requiring assist for safety); unsteady  Stairs            Wheelchair Mobility    Modified Rankin (Stroke Patients Only)       Balance Overall balance assessment: History of Falls;Needs assistance   Sitting balance-Leahy Scale: Poor Sitting balance - Comments: Intermittent posterior lean requiring vc's and assist for repositioning (pt able to hold sitting balance SBA intermittently before starting to lean backwards again) Postural control: Posterior lean   Standing balance-Leahy Scale: Poor Standing balance comment: posterior lean; requires UE support on RW  Pertinent Vitals/Pain Pain Assessment: Faces Faces Pain Scale: Hurts a little bit Pain Location: low back Pain Descriptors / Indicators: Sore Pain Intervention(s): Limited activity within patient's tolerance;Monitored during session;Repositioned  Vitals stable (HR and O2 on 2 L O2 via nasal cannula) and WFL  throughout treatment session.    Home Living Family/patient expects to be discharged to:: Private residence Living Arrangements: Spouse/significant other Available Help at Discharge: Family Type of Home: House Home Access: Stairs to enter Entrance Stairs-Rails: Right;Left;Can reach both Entrance Stairs-Number of Steps: [place]: One level [place]: Clinical cytogeneticist - 2 wheels;Walker - 4 wheels;Cane - single point;Grab bars - tub/shower;Bedside commode      Prior Function Level of Independence: Independent with assistive device(s)         Comments: Pt ambulates without AD normally.  3 falls OOB about 1 week ago.  2-3L home O2.     Hand Dominance        Extremity/Trunk Assessment   Upper Extremity Assessment Upper Extremity Assessment: Difficult to assess due to impaired cognition;Defer to OT evaluation    Lower Extremity Assessment Lower Extremity Assessment: Difficult to assess due to impaired cognition (Pt with difficulty following commands intermittently)       Communication   Communication: No difficulties  Cognition Arousal/Alertness: Awake/alert   Overall Cognitive Status:  (Oriented to person, place, year, month)                                 General Comments: Delayed reactions and verbalization      General Comments General comments (skin integrity, edema, etc.): Pt's husband present during part of session.  Nursing cleared pt for participation in physical therapy.  Pt and pt's husband agreeable to PT session.    Exercises  Transfer and gait training.   Assessment/Plan    PT Assessment Patient needs continued PT services  PT Problem List Decreased strength;Decreased activity tolerance;Decreased balance;Decreased mobility;Decreased cognition;Decreased knowledge of use of DME;Decreased safety awareness;Decreased knowledge of precautions       PT Treatment Interventions DME instruction;Gait training;Stair  training;Functional mobility training;Therapeutic activities;Therapeutic exercise;Balance training;Patient/family education    PT Goals (Current goals can be found in the Care Plan section)  Acute Rehab PT Goals Patient Stated Goal: to improve strength and endurance PT Goal Formulation: With patient/family Time For Goal Achievement: 01/22/17 Potential to Achieve Goals: Good    Frequency Min 2X/week   Barriers to discharge Decreased caregiver support      Co-evaluation               AM-PAC PT "6 Clicks" Daily Activity  Outcome Measure Difficulty turning over in bed (including adjusting bedclothes, sheets and blankets)?: Unable Difficulty moving from lying on back to sitting on the side of the bed? : Unable Difficulty sitting down on and standing up from a chair with arms (e.g., wheelchair, bedside commode, etc,.)?: Unable Help needed moving to and from a bed to chair (including a wheelchair)?: Total Help needed walking in hospital room?: Total Help needed climbing 3-5 steps with a railing? : Total 6 Click Score: 6    End of Session Equipment Utilized During Treatment: Gait belt;Oxygen (2 L O2 via nasal cannula) Activity Tolerance: Patient limited by fatigue Patient left: in chair;with call bell/phone within reach;with chair alarm set;with family/visitor present Nurse Communication: Mobility status;Precautions PT Visit Diagnosis: Unsteadiness on feet (R26.81);Other abnormalities of gait and mobility (R26.89);Muscle weakness (generalized) (M62.81);History of falling (Z91.81)  Time: 7615-1834 PT Time Calculation (min) (ACUTE ONLY): 50 min   Charges:   PT Evaluation $PT Eval Low Complexity: 1 Low PT Treatments $Therapeutic Activity: 23-37 mins   PT G Codes:   PT G-Codes **NOT FOR INPATIENT CLASS** Functional Assessment Tool Used: AM-PAC 6 Clicks Basic Mobility Functional Limitation: Mobility: Walking and moving around Mobility: Walking and Moving Around Current  Status (P7357): 100 percent impaired, limited or restricted Mobility: Walking and Moving Around Goal Status (I9784): At least 20 percent but less than 40 percent impaired, limited or restricted    Leitha Bleak, PT 01/08/17, 3:36 PM [phone]

## 2017-01-09 ENCOUNTER — Inpatient Hospital Stay: Payer: Medicare HMO

## 2017-01-09 ENCOUNTER — Encounter: Payer: Self-pay | Admitting: Internal Medicine

## 2017-01-09 LAB — PROTEIN ELECTROPHORESIS, SERUM
A/G RATIO SPE: 0.8 (ref 0.7–1.7)
ALPHA-1-GLOBULIN: 0.4 g/dL (ref 0.0–0.4)
ALPHA-2-GLOBULIN: 0.8 g/dL (ref 0.4–1.0)
Albumin ELP: 2.3 g/dL — ABNORMAL LOW (ref 2.9–4.4)
Beta Globulin: 0.8 g/dL (ref 0.7–1.3)
GLOBULIN, TOTAL: 2.8 g/dL (ref 2.2–3.9)
Gamma Globulin: 0.7 g/dL (ref 0.4–1.8)
Total Protein ELP: 5.1 g/dL — ABNORMAL LOW (ref 6.0–8.5)

## 2017-01-09 LAB — GLUCOSE, CAPILLARY
GLUCOSE-CAPILLARY: 214 mg/dL — AB (ref 65–99)
GLUCOSE-CAPILLARY: 222 mg/dL — AB (ref 65–99)
GLUCOSE-CAPILLARY: 187 mg/dL — AB (ref 65–99)
GLUCOSE-CAPILLARY: 175 mg/dL — AB (ref 65–99)

## 2017-01-09 LAB — PARATHYROID HORMONE, INTACT (NO CA): PTH: 89 pg/mL — ABNORMAL HIGH (ref 15–65)

## 2017-01-09 LAB — ANA W/REFLEX IF POSITIVE: Anti Nuclear Antibody(ANA): NEGATIVE

## 2017-01-09 LAB — PROCALCITONIN: PROCALCITONIN: 0.74 ng/mL

## 2017-01-09 MED ORDER — HYDRALAZINE HCL 25 MG PO TABS
25.0000 mg | ORAL_TABLET | Freq: Three times a day (TID) | ORAL | Status: DC
  Administered 2017-01-09 – 2017-01-11 (×7): 25 mg via ORAL
  Filled 2017-01-09 (×8): qty 1

## 2017-01-09 MED ORDER — LOSARTAN POTASSIUM 50 MG PO TABS: 50.0000 mg | ORAL_TABLET | Freq: Every day | ORAL | Status: DC

## 2017-01-09 MED ORDER — LEVOFLOXACIN 750 MG PO TABS
750.0000 mg | ORAL_TABLET | ORAL | Status: DC
  Administered 2017-01-09: 08:00:00 750 mg via ORAL
  Filled 2017-01-09: qty 1

## 2017-01-09 MED ORDER — POLYETHYLENE GLYCOL 3350 17 G PO PACK
17.0000 g | PACK | Freq: Two times a day (BID) | ORAL | Status: DC
  Administered 2017-01-09 – 2017-01-11 (×5): 17 g via ORAL
  Filled 2017-01-09 (×5): qty 1

## 2017-01-09 MED ORDER — PREDNISONE 50 MG PO TABS
50.0000 mg | ORAL_TABLET | Freq: Every day | ORAL | Status: DC
  Administered 2017-01-09 – 2017-01-11 (×3): 50 mg via ORAL
  Filled 2017-01-09 (×2): qty 1

## 2017-01-09 NOTE — Progress Notes (Signed)
SLP Cancellation Note  Patient Details Name: Ameerah Huffstetler Beckett MRN: [id number] DOB: 1945/07/28   Cancelled treatment:       Reason Eval/Treat Not Completed: Patient declined, no reason specified (chart reviewed; pt endorsed upset stomach). Pt stated she did not want anything to eat or drink; Husband agreed stating he had attempted to feed her some food/drink at lunch meal but she declined. Encouraged pt to discuss this w/ MD/NSG; SLP updated NSG. Husband and pt did deny any difficulty swallowing w/ po's since evaluation. ST services will f/u tomorrow w/ pt's status.    Orinda Kenner, MS, CCC-SLP Watson,Katherine 01/09/2017, 1:38 PM

## 2017-01-09 NOTE — Progress Notes (Signed)
[facility] [place] NAME: Andrea [name]    MR#:  [id number]  DATE OF BIRTH:  September 10, 1945  SUBJECTIVE:  CHIEF COMPLAINT:   Chief Complaint  Patient presents with  . Loss of Consciousness   Awake and alert. Back to baseline.  Some wheezing today  REVIEW OF SYSTEMS:    Review of Systems  Constitutional: Positive for malaise/fatigue. Negative for chills and fever.  HENT: Negative for sore throat.   Eyes: Negative for blurred vision, double vision and pain.  Respiratory: Negative for cough, hemoptysis, shortness of breath and wheezing.   Cardiovascular: Negative for chest pain, palpitations, orthopnea and leg swelling.  Gastrointestinal: Negative for abdominal pain, constipation, diarrhea, heartburn, nausea and vomiting.  Genitourinary: Negative for dysuria and hematuria.  Musculoskeletal: Negative for back pain and joint pain.  Skin: Negative for rash.  Neurological: Positive for weakness. Negative for sensory change, speech change, focal weakness and headaches.  Endo/Heme/Allergies: Does not bruise/bleed easily.  Psychiatric/Behavioral: Negative for depression. The patient is not nervous/anxious.     DRUG ALLERGIES:   Allergies  Allergen Reactions  . Watermelon Concentrate Anaphylaxis  . Ace Inhibitors   . Celebrex [Celecoxib] Rash  . Penicillins Rash  . Sulfa Antibiotics Rash    VITALS:  Blood pressure (!) 182/67, pulse 68, temperature 98.3 F (36.8 C), resp. rate 20, height 5\' 3"  (1.6 m), weight 89.4 kg (197 lb), SpO2 93 %.  PHYSICAL EXAMINATION:   Physical Exam  GENERAL:  71 y.o.-year-old patient lying in the bed with no acute distress.  EYES: Pupils equal, round, reactive to light and accommodation. No scleral icterus. Extraocular muscles intact.  HEENT: Head atraumatic, normocephalic. Oropharynx and nasopharynx clear.  NECK:  Supple, no jugular venous distention. No thyroid enlargement, no tenderness.  LUNGS: Bilateral  wheezing CARDIOVASCULAR: S1, S2 normal. No murmurs, rubs, or gallops.  ABDOMEN: Soft, nontender, nondistended. Bowel sounds present. No organomegaly or mass.  EXTREMITIES: No cyanosis, clubbing or edema b/l.    NEUROLOGIC: Cranial nerves II through XII are intact. Left lower extremity weakness. PSYCHIATRIC: The patient is alert and Awake. Oriented to place. SKIN: No obvious rash, lesion, or ulcer.   LABORATORY PANEL:   CBC  Recent Labs Lab 01/07/17 0729  WBC 6.4  HGB 9.5*  HCT 28.9*  PLT 128*   ------------------------------------------------------------------------------------------------------------------ Chemistries   Recent Labs Lab 01/06/17 0419  01/08/17 0415  NA 138  < > 138  K 5.5*  < > 4.9  CL 110  < > 114*  CO2 22  < > 21*  GLUCOSE 109*  < > 251*  BUN 42*  < > 54*  CREATININE 2.50*  < > 2.41*  CALCIUM 8.3*  < > 8.3*  AST 27  --   --   ALT 19  --   --   ALKPHOS 101  --   --   BILITOT 0.5  --   --   < > = values in this interval not displayed. ------------------------------------------------------------------------------------------------------------------  Cardiac Enzymes  Recent Labs Lab 01/06/17 0419  TROPONINI 0.06*   ------------------------------------------------------------------------------------------------------------------  RADIOLOGY:  US Renal  Result Date: 01/08/2017 CLINICAL DATA:  Acute renal insufficiency. Known left renal atrophy. EXAM: RENAL / URINARY TRACT ULTRASOUND COMPLETE COMPARISON:  10.8 cm FINDINGS: Right Kidney: Length: 10.8 cm. The renal cortical echotexture is greater than that of the adjacent liver. There is no hydronephrosis nor focal mass. No stones are evident. Left Kidney: Length: 6.8 cm. The cortical echoetexture is increased. There  is diffuse cortical thinning. There is no hydronephrosis nor cystic or solid mass observed. Bladder: The partially distended urinary bladder is normal. IMPRESSION: Chronic left renal  atrophy. Increased renal cortical echotexture bilaterally consistent with medical renal disease. There is no hydronephrosis. The urinary bladder is unremarkable. Electronically Signed   By: David  Martinique M.D.   On: 01/08/2017 10:31   US Carotid Bilateral  Result Date: 01/08/2017 CLINICAL DATA:  CVA. EXAM: BILATERAL CAROTID DUPLEX ULTRASOUND TECHNIQUE: Pearline Cables scale imaging, color Doppler and duplex ultrasound were performed of bilateral carotid and vertebral arteries in the neck. COMPARISON:  CT 01/07/2017.  CT 11/16/2011. FINDINGS: Criteria: Quantification of carotid stenosis is based on velocity parameters that correlate the residual internal carotid diameter with NASCET-based stenosis levels, using the diameter of the distal internal carotid lumen as the denominator for stenosis measurement. Exam very limited due to patient's body habitus and motion. The following velocity measurements were obtained: RIGHT ICA:  30/11 cm/sec CCA:  05/39 cm/sec SYSTOLIC ICA/CCA RATIO:  0.6 DIASTOLIC ICA/CCA RATIO:  1.0 ECA:  64 cm/sec LEFT ICA:  61/12 cm/sec CCA:  76/73 cm/sec SYSTOLIC ICA/CCA RATIO:  0.7 DIASTOLIC ICA/CCA RATIO:  0.7 ECA:  57 cm/sec RIGHT CAROTID ARTERY: Mild multifocal atherosclerotic vascular plaque. Prominent calcific plaque right carotid bifurcation noted on prior CT of 11/16/2011 no longer identified. RIGHT VERTEBRAL ARTERY:  Patent with antegrade flow. LEFT CAROTID ARTERY: Mild multifocal atherosclerotic vascular plaque. No flow limiting stenosis. LEFT VERTEBRAL ARTERY:  Patent with antegrade flow. IMPRESSION: 1. Mild bilateral multifocal atherosclerotic vascular plaque. No flow limiting stenosis. Degree of stenosis less than 50% bilaterally. 2. Vertebrals are patent antegrade flow. Exam very limited due to patient's body habitus and motion. Electronically Signed   By: Marcello Moores  Register   On: 01/08/2017 10:48     ASSESSMENT AND PLAN:   Andrea [name]  is a 71 y.o. female with a known history of congestive  heart failure, hypertension, hypothyroidism, insulin requiring diabetes mellitus and other medical problems is brought into the ED for altered mental status by her husband. Patient was found to be unresponsive at 9:30 AM today and patient was in her usual state of health until last night.Husband is at bedside and providing history    # COPD exacerbation Start prednisone Nebs  # Acute encephalopathy likely due to CVA due to some left sided weakness CT head showed no CVA. Repeat CT head ordered today shows no stroke. Cannot get MRI Due to pacemaker  Symptoms are improving.  Carotid Dopplers and echocardiogram showed nothing acute  Patient is on Eliquis due to history of atrial fibrillation.  # Acute kidney injury with hyperkalemia. Improved with IV fluids. Now stopped  # Left lower lobe pneumonia - improved on chest x-ray. No fever or cough. Normal WBC.  But does have elevated pro calcitonin and no other source of infection.  Finished abx  # Insulin requiring diabetes mellitus Poor oral intake. On reduced dose of Lantus and sliding scale insulin.  # Hypothyroidism On levothyroxine  # Mild elevation in troponin. Seen by cardiology. No further investigations needed.  # Hypertension, uncontrolled. Improved with her home medications.  Waiting for SNF authorization  All the records are reviewed and case discussed with Care Management/Social Worker Management plans discussed with the patient, family and they are in agreement.  CODE STATUS: FULL CODE  DVT Prophylaxis: SCDs  TOTAL TIME TAKING CARE OF THIS PATIENT: 35 minutes.   POSSIBLE D/C IN 1-2 DAYS, DEPENDING ON CLINICAL CONDITION.  Brodey Bonn, Artist R  M.D on 01/09/2017 at 12:11 PM  Between 7am to 6pm - Pager - [phone]  After 6pm go to [email or web address] - password [email or web address] [facility]  Office  [phone]  CC: Primary care physician; Patient, No Pcp Per  Note: This dictation was prepared with  Dragon dictation along with smaller phrase technology. Any transcriptional errors that result from this process are unintentional.

## 2017-01-09 NOTE — Plan of Care (Signed)
Problem: Education: Goal: Knowledge of [facility] Education information/materials will improve Outcome: Progressing Patient needs mild reinforcement this shift  Problem: Safety: Goal: Ability to remain free from injury will improve Outcome: Progressing Patient needs reinforcement to call for help

## 2017-01-09 NOTE — Progress Notes (Signed)
[facility]  ROUNDING NOTE   Subjective:  Awaiting new Cr this AM.  Resting in bed.  Eating breakfast.   Objective:  Vital signs in last 24 hours:  Temp:  [98 F (36.7 C)-98.5 F (36.9 C)] 98.3 F (36.8 C) (08/29 0432) Pulse Rate:  [62-68] 68 (08/29 0432) Resp:  [18-20] 20 (08/29 0432) BP: (167-184)/(53-74) 182/67 (08/29 0432) SpO2:  [90 %-100 %] 93 % (08/29 0432)  Weight change:  Filed Weights   01/05/17 1112  Weight: 89.4 kg (197 lb)    Intake/Output: I/O last 3 completed shifts: In: 480 [P.O.:480] Out: -    Intake/Output this shift:  No intake/output data recorded.  Physical Exam: General: No acute distress  Head: Normocephalic, atraumatic. Moist oral mucosal membranes  Eyes: Anicteric  Neck: Supple, trachea midline  Lungs:  Clear to auscultation, normal effort  Heart: S1S2 no rubs  Abdomen:  Soft, nontender, bowel sounds present  Extremities: No peripheral edema.  Neurologic: Awake, alert, Confused at times   Skin: No lesions       Basic Metabolic Panel:  Recent Labs Lab 01/05/17 1107 01/06/17 0419 01/07/17 0502 01/08/17 0415  NA 137 138 137 138  K 5.1 5.5* 5.4* 4.9  CL 108 110 111 114*  CO2 23 22 20* 21*  GLUCOSE 167* 109* 147* 251*  BUN 37* 42* 50* 54*  CREATININE 2.08* 2.50* 2.84* 2.41*  CALCIUM 8.8* 8.3* 8.4* 8.3*  PHOS  --   --   --  4.4    Liver Function Tests:  Recent Labs Lab 01/05/17 1107 01/06/17 0419  AST 45* 27  ALT 22 19  ALKPHOS 107 101  BILITOT 0.4 0.5  PROT 6.4* 5.6*  ALBUMIN 3.1* 2.5*   No results for input(s): LIPASE, AMYLASE in the last 168 hours.  Recent Labs Lab 01/07/17 0502  AMMONIA 10    CBC:  Recent Labs Lab 01/05/17 1107 01/06/17 0419 01/07/17 0729  WBC 11.3* 7.8 6.4  NEUTROABS 8.6*  --  4.6  HGB 11.2* 10.1* 9.5*  HCT 33.9* 30.4* 28.9*  MCV 82.8 85.1 85.6  PLT 156 121* 128*    Cardiac Enzymes:  Recent Labs Lab 01/05/17 1709 01/05/17 2214 01/06/17 0419 01/07/17 0729   CKTOTAL  --   --   --  56  TROPONINI 0.07* 0.06* 0.06*  --     BNP: Invalid input(s): POCBNP  CBG:  Recent Labs Lab 01/08/17 0732 01/08/17 1138 01/08/17 1644 01/08/17 2032 01/09/17 0746  GLUCAP 185* 129* 187* 183* 214*    Microbiology: Results for orders placed or performed during the hospital encounter of 01/05/17  Blood Culture (routine x 2)     Status: None (Preliminary result)   Collection Time: 01/05/17 11:35 AM  Result Value Ref Range Status   Specimen Description BLOOD BLOOD RIGHT FOREARM  Final   Special Requests   Final    BOTTLES DRAWN AEROBIC AND ANAEROBIC Blood Culture adequate volume   Culture NO GROWTH 4 DAYS  Final   Report Status PENDING  Incomplete  Blood Culture (routine x 2)     Status: None (Preliminary result)   Collection Time: 01/05/17 11:40 AM  Result Value Ref Range Status   Specimen Description BLOOD BLOOD LEFT HAND  Final   Special Requests   Final    BOTTLES DRAWN AEROBIC AND ANAEROBIC Blood Culture adequate volume   Culture NO GROWTH 4 DAYS  Final   Report Status PENDING  Incomplete    Coagulation Studies: No results for  input(s): LABPROT, INR in the last 72 hours.  Urinalysis: No results for input(s): COLORURINE, LABSPEC, PHURINE, GLUCOSEU, HGBUR, BILIRUBINUR, KETONESUR, PROTEINUR, UROBILINOGEN, NITRITE, LEUKOCYTESUR in the last 72 hours.  Invalid input(s): APPERANCEUR    Imaging: US Renal  Result Date: 01/08/2017 CLINICAL DATA:  Acute renal insufficiency. Known left renal atrophy. EXAM: RENAL / URINARY TRACT ULTRASOUND COMPLETE COMPARISON:  10.8 cm FINDINGS: Right Kidney: Length: 10.8 cm. The renal cortical echotexture is greater than that of the adjacent liver. There is no hydronephrosis nor focal mass. No stones are evident. Left Kidney: Length: 6.8 cm. The cortical echoetexture is increased. There is diffuse cortical thinning. There is no hydronephrosis nor cystic or solid mass observed. Bladder: The partially distended urinary  bladder is normal. IMPRESSION: Chronic left renal atrophy. Increased renal cortical echotexture bilaterally consistent with medical renal disease. There is no hydronephrosis. The urinary bladder is unremarkable. Electronically Signed   By: David  Martinique M.D.   On: 01/08/2017 10:31   US Carotid Bilateral  Result Date: 01/08/2017 CLINICAL DATA:  CVA. EXAM: BILATERAL CAROTID DUPLEX ULTRASOUND TECHNIQUE: Pearline Cables scale imaging, color Doppler and duplex ultrasound were performed of bilateral carotid and vertebral arteries in the neck. COMPARISON:  CT 01/07/2017.  CT 11/16/2011. FINDINGS: Criteria: Quantification of carotid stenosis is based on velocity parameters that correlate the residual internal carotid diameter with NASCET-based stenosis levels, using the diameter of the distal internal carotid lumen as the denominator for stenosis measurement. Exam very limited due to patient's body habitus and motion. The following velocity measurements were obtained: RIGHT ICA:  30/11 cm/sec CCA:  67/89 cm/sec SYSTOLIC ICA/CCA RATIO:  0.6 DIASTOLIC ICA/CCA RATIO:  1.0 ECA:  64 cm/sec LEFT ICA:  61/12 cm/sec CCA:  38/10 cm/sec SYSTOLIC ICA/CCA RATIO:  0.7 DIASTOLIC ICA/CCA RATIO:  0.7 ECA:  57 cm/sec RIGHT CAROTID ARTERY: Mild multifocal atherosclerotic vascular plaque. Prominent calcific plaque right carotid bifurcation noted on prior CT of 11/16/2011 no longer identified. RIGHT VERTEBRAL ARTERY:  Patent with antegrade flow. LEFT CAROTID ARTERY: Mild multifocal atherosclerotic vascular plaque. No flow limiting stenosis. LEFT VERTEBRAL ARTERY:  Patent with antegrade flow. IMPRESSION: 1. Mild bilateral multifocal atherosclerotic vascular plaque. No flow limiting stenosis. Degree of stenosis less than 50% bilaterally. 2. Vertebrals are patent antegrade flow. Exam very limited due to patient's body habitus and motion. Electronically Signed   By: Marcello Moores  Register   On: 01/08/2017 10:48     Medications:    . amLODipine  10 mg  Oral Daily  . apixaban  2.5 mg Oral BID  . atorvastatin  20 mg Oral q1800  . carvedilol  12.5 mg Oral BID WC  . clopidogrel  75 mg Oral Daily  . furosemide  40 mg Oral Daily  . hydrALAZINE  25 mg Oral Q8H  . insulin aspart  0-5 Units Subcutaneous QHS  . insulin aspart  0-9 Units Subcutaneous TID WC  . insulin glargine  15 Units Subcutaneous Daily  . levofloxacin  750 mg Oral Q48H  . levothyroxine  150 mcg Oral QAC breakfast  . mouth rinse  15 mL Mouth Rinse BID  . PARoxetine  40 mg Oral Daily  . predniSONE  50 mg Oral Q breakfast   acetaminophen **OR** acetaminophen, ipratropium-albuterol, metoprolol tartrate, ondansetron **OR** ondansetron (ZOFRAN) IV  Assessment/ Plan:  71 y.o. female with a PMHx of Congestive heart failure, hypertension, hypothyroidism, diabetes mellitus2, nonalcoholic fatty liver disease, obesity, peptic ulcer disease, chronic kidney disease stage IV, who was admitted to [facility] on 01/05/2017 for evaluation of  altered mental status.   1. Acute renal failure/chronic kidney disease stage IV baseline creatinine 2.3/proteinuria. The patient has not seen a nephrologist in the past. She does have advanced renal dysfunction at baseline.  Atrophic right kidney noted at 6.8cm.  -  Awaiting new renal function today, otherwise continue supportive care.   2. Anemia of chronic kidney disease. Hgb currently 9.5, continue to monitor as an outpt.   3. Hypertension. Blood pressure currently 182/67.  Continue amlodipine and coreg.  Agree with hydralazine.   4. Altered mental status.  Seems to be at her baseline.    LOS: 4 Andrea [name] 8/29/20189:36 AM

## 2017-01-09 NOTE — Discharge Instructions (Signed)
Information on my medicine - ELIQUIS® (apixaban) ° °This medication education was reviewed with me or my healthcare representative as part of my discharge preparation.  The pharmacist that spoke with me during my hospital stay was:  Zykeriah Mathia D Dyshawn Cangelosi, RPH ° °Why was Eliquis® prescribed for you? °Eliquis® was prescribed for you to reduce the risk of a blood clot forming that can cause a stroke if you have a medical condition called atrial fibrillation (a type of irregular heartbeat). ° °What do You need to know about Eliquis® ? °Take your Eliquis® TWICE DAILY - one tablet in the morning and one tablet in the evening with or without food. If you have difficulty swallowing the tablet whole please discuss with your pharmacist how to take the medication safely. ° °Take Eliquis® exactly as prescribed by your doctor and DO NOT stop taking Eliquis® without talking to the doctor who prescribed the medication.  Stopping [place] increase your risk of developing a stroke.  Refill your prescription before you run out. ° °After discharge, you should have regular check-up appointments with your healthcare provider that is prescribing your Eliquis®.  In the future your dose [place] need to be changed if your kidney function or weight changes by a significant amount or as you get older. ° °What do you do if you miss a dose? °If you miss a dose, take it as soon as you remember on the same day and resume taking twice daily.  Do not take more than one dose of ELIQUIS at the same time to make up a missed dose. ° °Important Safety Information °A possible side effect of Eliquis® is bleeding. You should call your healthcare provider right away if you experience any of the following: °? Bleeding from an injury or your nose that does not stop. °? Unusual colored urine (red or dark brown) or unusual colored stools (red or black). °? Unusual bruising for unknown reasons. °? A serious fall or if you hit your head (even if there is no bleeding). ° °Some  medicines [place] interact with Eliquis® and might increase your risk of bleeding or clotting while on Eliquis®. To help avoid this, consult your healthcare provider or pharmacist prior to using any new prescription or non-prescription medications, including herbals, vitamins, non-steroidal anti-inflammatory drugs (NSAIDs) and supplements. ° °This website has more information on Eliquis® (apixaban): http://www.eliquis.com/eliquis/home ° °

## 2017-01-09 NOTE — Clinical Social Work Note (Signed)
CSW presented bed offers to patient and her husband, they have chosen [facility]  CSW contacted [facility] who will have a bed available on Thursday, however they have to wait for insurance authorization.  SNF has started the insurance auth process, awaiting to hear from SNF when Josem Kaufmann has been approved.  Jones Broom. [place], MSW, [place]  01/09/2017 2:45 PM

## 2017-01-10 ENCOUNTER — Encounter
Admission: RE | Admit: 2017-01-10 | Discharge: 2017-01-10 | Disposition: A | Discharge status: Home / Self Care | Payer: Medicare HMO | Source: Ambulatory Visit | Attending: Internal Medicine | Admitting: Internal Medicine

## 2017-01-10 LAB — GLUCOSE, CAPILLARY
GLUCOSE-CAPILLARY: 167 mg/dL — AB (ref 65–99)
GLUCOSE-CAPILLARY: 550 mg/dL — AB (ref 65–99)
GLUCOSE-CAPILLARY: 497 mg/dL — AB (ref 65–99)
GLUCOSE-CAPILLARY: 528 mg/dL — AB (ref 65–99)
Glucose-Capillary: 255 mg/dL — ABNORMAL HIGH (ref 65–99)
Glucose-Capillary: 543 mg/dL (ref 65–99)
Glucose-Capillary: 395 mg/dL — ABNORMAL HIGH (ref 65–99)

## 2017-01-10 LAB — CULTURE, BLOOD (ROUTINE X 2)
CULTURE: NO GROWTH
Culture: NO GROWTH
SPECIAL REQUESTS: ADEQUATE
Special Requests: ADEQUATE

## 2017-01-10 MED ORDER — BISACODYL 5 MG PO TBEC
10.0000 mg | DELAYED_RELEASE_TABLET | Freq: Once | ORAL | Status: AC
  Administered 2017-01-10: 10 mg via ORAL
  Filled 2017-01-10: qty 2

## 2017-01-10 MED ORDER — POLYETHYLENE GLYCOL 3350 17 G PO PACK: 17.0000 g | PACK | Freq: Every day | ORAL | 0 refills | Status: DC

## 2017-01-10 MED ORDER — HYDRALAZINE HCL 25 MG PO TABS: 25.0000 mg | ORAL_TABLET | Freq: Three times a day (TID) | ORAL | 0 refills | Status: DC

## 2017-01-10 MED ORDER — FUROSEMIDE 40 MG PO TABS
80.0000 mg | ORAL_TABLET | Freq: Once | ORAL | Status: AC
  Administered 2017-01-10: 14:00:00 80 mg via ORAL
  Filled 2017-01-10: qty 2

## 2017-01-10 MED ORDER — INSULIN GLARGINE 100 UNIT/ML ~~LOC~~ SOLN
12.0000 [IU] | Freq: Once | SUBCUTANEOUS | Status: AC
  Administered 2017-01-10: 12 [IU] via SUBCUTANEOUS
  Filled 2017-01-10 (×2): qty 0.12

## 2017-01-10 MED ORDER — FUROSEMIDE 10 MG/ML IJ SOLN
60.0000 mg | Freq: Once | INTRAMUSCULAR | Status: DC
  Filled 2017-01-10: qty 6

## 2017-01-10 MED ORDER — VITAMIN B-12 1000 MCG PO TABS
1000.0000 ug | ORAL_TABLET | Freq: Every day | ORAL | Status: DC
  Administered 2017-01-10 – 2017-01-11 (×2): 1000 ug via ORAL
  Filled 2017-01-10 (×2): qty 1

## 2017-01-10 MED ORDER — PREDNISONE 20 MG PO TABS: 20.0000 mg | ORAL_TABLET | Freq: Every day | ORAL | 0 refills | Status: DC

## 2017-01-10 MED ORDER — INSULIN ASPART 100 UNIT/ML ~~LOC~~ SOLN
12.0000 [IU] | Freq: Once | SUBCUTANEOUS | Status: AC
  Administered 2017-01-10: 12 [IU] via SUBCUTANEOUS
  Filled 2017-01-10: qty 1

## 2017-01-10 MED ORDER — INSULIN GLARGINE 100 UNIT/ML ~~LOC~~ SOLN
30.0000 [IU] | Freq: Every day | SUBCUTANEOUS | Status: DC
  Administered 2017-01-11: 30 [IU] via SUBCUTANEOUS
  Filled 2017-01-10: qty 0.3

## 2017-01-10 MED ORDER — FLEET ENEMA 7-19 GM/118ML RE ENEM
1.0000 | ENEMA | Freq: Every day | RECTAL | Status: DC | PRN
  Administered 2017-01-10: 1 via RECTAL
  Filled 2017-01-10: qty 1

## 2017-01-10 MED ORDER — INSULIN GLARGINE 100 UNIT/ML ~~LOC~~ SOLN
20.0000 [IU] | Freq: Every day | SUBCUTANEOUS | Status: DC
  Filled 2017-01-10: qty 0.2

## 2017-01-10 MED ORDER — INSULIN ASPART 100 UNIT/ML ~~LOC~~ SOLN
15.0000 [IU] | Freq: Once | SUBCUTANEOUS | Status: AC
  Administered 2017-01-10: 15 [IU] via SUBCUTANEOUS
  Filled 2017-01-10: qty 1

## 2017-01-10 NOTE — Progress Notes (Signed)
[place] [facility] [place] NAME: Andrea [name]    MR#:  [id number]  DATE OF BIRTH:  20-Jan-1946  SUBJECTIVE:  CHIEF COMPLAINT:   Chief Complaint  Patient presents with  . Loss of Consciousness   Awake and alert.  Had some abd pain yesterday which has resolved. Constipated.  REVIEW OF SYSTEMS:    Review of Systems  Constitutional: Positive for malaise/fatigue. Negative for chills and fever.  HENT: Negative for sore throat.   Eyes: Negative for blurred vision, double vision and pain.  Respiratory: Negative for cough, hemoptysis, shortness of breath and wheezing.   Cardiovascular: Negative for chest pain, palpitations, orthopnea and leg swelling.  Gastrointestinal: Negative for abdominal pain, constipation, diarrhea, heartburn, nausea and vomiting.  Genitourinary: Negative for dysuria and hematuria.  Musculoskeletal: Negative for back pain and joint pain.  Skin: Negative for rash.  Neurological: Positive for weakness. Negative for sensory change, speech change, focal weakness and headaches.  Endo/Heme/Allergies: Does not bruise/bleed easily.  Psychiatric/Behavioral: Negative for depression. The patient is not nervous/anxious.     DRUG ALLERGIES:   Allergies  Allergen Reactions  . Watermelon Concentrate Anaphylaxis  . Ace Inhibitors   . Celebrex [Celecoxib] Rash  . Penicillins Rash  . Sulfa Antibiotics Rash    VITALS:  Blood pressure (!) 148/55, pulse 61, temperature 98.7 F (37.1 C), resp. rate 20, height 5\' 3"  (1.6 m), weight 89.4 kg (197 lb), SpO2 100 %.  PHYSICAL EXAMINATION:   Physical Exam  GENERAL:  71 y.o.-year-old patient lying in the bed with no acute distress.  EYES: Pupils equal, round, reactive to light and accommodation. No scleral icterus. Extraocular muscles intact.  HEENT: Head atraumatic, normocephalic. Oropharynx and nasopharynx clear.  NECK:  Supple, no jugular venous distention. No thyroid enlargement, no  tenderness.  LUNGS: Bilateral wheezing CARDIOVASCULAR: S1, S2 normal. No murmurs, rubs, or gallops.  ABDOMEN: Soft, nontender, nondistended. Bowel sounds present. No organomegaly or mass.  EXTREMITIES: No cyanosis, clubbing or edema b/l.    NEUROLOGIC: Cranial nerves II through XII are intact. Left lower extremity weakness. PSYCHIATRIC: The patient is alert and Awake. Oriented to place. SKIN: No obvious rash, lesion, or ulcer.   LABORATORY PANEL:   CBC  Recent Labs Lab 01/07/17 0729  WBC 6.4  HGB 9.5*  HCT 28.9*  PLT 128*   ------------------------------------------------------------------------------------------------------------------ Chemistries   Recent Labs Lab 01/06/17 0419  01/08/17 0415  NA 138  < > 138  K 5.5*  < > 4.9  CL 110  < > 114*  CO2 22  < > 21*  GLUCOSE 109*  < > 251*  BUN 42*  < > 54*  CREATININE 2.50*  < > 2.41*  CALCIUM 8.3*  < > 8.3*  AST 27  --   --   ALT 19  --   --   ALKPHOS 101  --   --   BILITOT 0.5  --   --   < > = values in this interval not displayed. ------------------------------------------------------------------------------------------------------------------  Cardiac Enzymes  Recent Labs Lab 01/06/17 0419  TROPONINI 0.06*   ------------------------------------------------------------------------------------------------------------------  RADIOLOGY:  Dg Abd 2 Views  Result Date: 01/09/2017 CLINICAL DATA:  Abdominal pain. EXAM: ABDOMEN - 2 VIEW COMPARISON:  Abdominal ultrasound 01/08/2017 FINDINGS: The bowel gas pattern is normal. There is no evidence of free air. No radio-opaque calculi or other significant radiographic abnormality is seen. Moderate amount of stool throughout the colon. Partial visualization of the thorax demonstrates enlarged cardiac silhouette with  probable left pleural effusion with associated atelectasis versus airspace consolidation. IMPRESSION: Nonobstructive bowel gas pattern. Constipation. Enlarged  cardiac silhouette. Small left pleural effusion with left lower lobe atelectasis versus airspace consolidation. Electronically Signed   By: Fidela Salisbury M.D.   On: 01/09/2017 16:07     ASSESSMENT AND PLAN:   Kestrel Laforge  is a 71 y.o. female with a known history of congestive heart failure, hypertension, hypothyroidism, insulin requiring diabetes mellitus and other medical problems is brought into the ED for altered mental status by her husband. Patient was found to be unresponsive at 9:30 AM today and patient was in her usual state of health until last night.Husband is at bedside and providing history   # constipation with abdominal pain. Abdominal x-ray was ordered and shows constipation. Ordered MiraLAX twice a day. We'll get fleets enema if patient does not have bowel movements today.  # COPD exacerbation Started prednisone Nebs  # Acute encephalopathy likely due to CVA due to some left sided weakness CT head showed no CVA. Repeat CT head ordered today shows no stroke. Cannot get MRI Due to pacemaker  Symptoms are improving.  Carotid Dopplers and echocardiogram showed nothing acute  Patient is on Eliquis due to history of atrial fibrillation.  # Acute kidney injury with hyperkalemia. Improved with IV fluids. Now stopped  # Left lower lobe pneumonia - improved on chest x-ray. No fever or cough. Normal WBC.  But does have elevated pro calcitonin and no other source of infection.  Finished abx  # Insulin requiring diabetes mellitus Poor oral intake. On reduced dose of Lantus and sliding scale insulin.  # Hypothyroidism On levothyroxine  # Mild elevation in troponin. Seen by cardiology. No further investigations needed.  # Hypertension, uncontrolled. Improved with her home medications.  Waiting for SNF authorization  All the records are reviewed and case discussed with Care Management/Social Worker Management plans discussed with the patient, family and they are in  agreement.  CODE STATUS: FULL CODE  DVT Prophylaxis: SCDs  TOTAL TIME TAKING CARE OF THIS PATIENT: 35 minutes.   POSSIBLE D/C IN 1-2 DAYS, DEPENDING ON CLINICAL CONDITION.  Hillary Bow R M.D on 01/10/2017 at 10:47 AM  Between 7am to 6pm - Pager - [phone]  After 6pm go to [email or web address] - password [email or web address] [facility]  Office  [phone]  CC: Primary care physician; Patient, No Pcp Per  Note: This dictation was prepared with Dragon dictation along with smaller phrase technology. Any transcriptional errors that result from this process are unintentional.

## 2017-01-10 NOTE — Progress Notes (Signed)
I was asked to start a peripheral IV on this pt however when I entered the room to start the IV she and her husband questioned the need.   "We are waiting to be discharged"  I spoke with primary RN.  She informed me pt needed a one time dose of Lasix.  I informed the pt and she and her husband requested that I not start an IV for a one time dose of Lasix.   "I take that as a pill"   "I don't want an IV"   Primary RN informed and is notifying MD.

## 2017-01-10 NOTE — Progress Notes (Signed)
Physical Therapy Treatment Patient Details Name: Andrea [name] MRN: [id number] DOB: 01-16-1946 Today's Date: 01/10/2017    History of Present Illness Pt is a 71 y.o. female presenting to hospital with decreased responsiveness.  Pt admitted to hospital with acute encephalopathy, AKI with hyperkalemia, and L LL PNA.  Pt with neuro c/s during hospitalization d/t possible R sided weakness (unable to get MRI); pt also with myoclonic jerks 01/06/17 but resolved.  PMH includes 2-3 L home O2, IDDM, CHF, chronic back pain, htn, PUD, NASH, pacemaker.    PT Comments    Participated in exercises as described below.  Pt to edge of bed with mod a x 1.  Upon sitting, pt noted to have audible wheezing.  Primary nurse in to check and discussed with MD.  To address.  OK to get to chair  Attempted transfer with rolling walker but she was unable to move her feet.  Hand held assist +2 to chair.  She continues to require +2 assist for safety with mobility skills  SNF remains appropriate for discharge.   Follow Up Recommendations  SNF     Equipment Recommendations  Rolling walker with 5" wheels    Recommendations for Other Services       Precautions / Restrictions Precautions Precautions: Fall Precaution Comments: Aspiration Restrictions Weight Bearing Restrictions: No    Mobility  Bed Mobility Overal bed mobility: Needs Assistance Bed Mobility: Supine to Sit     Supine to sit: Mod assist        Transfers Overall transfer level: Needs assistance Equipment used: Rolling walker (2 wheeled) Transfers: Sit to/from Stand Sit to Stand: Min assist;+2 physical assistance         General transfer comment: unable to step or turn feet today with walker,  walker put to side and hand held assist to chair + 2  Ambulation/Gait Ambulation/Gait assistance: Min assist;+2 physical assistance Ambulation Distance (Feet): 2 Feet Assistive device: 2 person hand held assist Gait Pattern/deviations: Step-to  pattern;Decreased step length - right;Decreased step length - left;Shuffle Gait velocity: decreased Gait velocity interpretation: <1.8 ft/sec, indicative of risk for recurrent falls     Stairs            Wheelchair Mobility    Modified Rankin (Stroke Patients Only)       Balance Overall balance assessment: History of Falls;Needs assistance Sitting-balance support: Feet supported;No upper extremity supported Sitting balance-Leahy Scale: Fair     Standing balance support: Bilateral upper extremity supported Standing balance-Leahy Scale: Poor Standing balance comment:  requires UE support on RW or HHA                            Cognition Arousal/Alertness: Awake/alert Behavior During Therapy: WFL for tasks assessed/performed Overall Cognitive Status: Within Functional Limits for tasks assessed                                 General Comments: Delayed reactions and verbalization      Exercises Other Exercises Other Exercises: BLE AAROM for ankle pumps, heel slides, ab/add SLR and SAQ - verbal and tactile cues needed    General Comments        Pertinent Vitals/Pain Pain Assessment: No/denies pain    Home Living                      Prior Function  PT Goals (current goals can now be found in the care plan section) Progress towards PT goals: Progressing toward goals    Frequency    Min 2X/week      PT Plan Current plan remains appropriate    Co-evaluation              AM-PAC PT "6 Clicks" Daily Activity  Outcome Measure  Difficulty turning over in bed (including adjusting bedclothes, sheets and blankets)?: Unable Difficulty moving from lying on back to sitting on the side of the bed? : Unable Difficulty sitting down on and standing up from a chair with arms (e.g., wheelchair, bedside commode, etc,.)?: Unable Help needed moving to and from a bed to chair (including a wheelchair)?: A Lot Help  needed walking in hospital room?: Total Help needed climbing 3-5 steps with a railing? : Total 6 Click Score: 7    End of Session Equipment Utilized During Treatment: Gait belt;Oxygen Activity Tolerance: Patient tolerated treatment well;Patient limited by fatigue;Other (comment) Patient left: in chair;with chair alarm set;with call bell/phone within reach Nurse Communication: Mobility status;Precautions;Other (comment)       Time: 0454-0981 PT Time Calculation (min) (ACUTE ONLY): 15 min  Charges:  $Therapeutic Exercise: 8-22 mins                    G Codes:       Chesley Noon, PTA 01/10/17, 11:21 AM

## 2017-01-10 NOTE — Discharge Summary (Addendum)
[facility] [facility] [place] NAME: Andrea [name]    MR#:  [id number]  DATE OF BIRTH:  [birth year]  DATE OF ADMISSION:  01/05/2017 ADMITTING PHYSICIAN: Nicholes Mango, MD  DATE OF DISCHARGE: 01/11/2017  PRIMARY CARE PHYSICIAN: Patient, No Pcp Per   ADMISSION DIAGNOSIS:  Altered mental status, unspecified altered mental status type [R41.82] Severe sepsis with septic shock (CODE) (HCC) [R65.21]  DISCHARGE DIAGNOSIS:  Active Problems:   Encephalopathy acute   Altered mental status   SECONDARY DIAGNOSIS:   Past Medical History:  Diagnosis Date  . CHF (congestive heart failure) ([place])   . Chronic back pain    Followed by pain clinic in [place]  . Endometriosis   . HTN (hypertension)   . Hypothyroidism   . IDDM (insulin dependent diabetes mellitus) ([place])   . Mild dementia   . NASH (nonalcoholic steatohepatitis)    has had liver biopsy in past  . Obesity   . PUD (peptic ulcer disease) 43's     ADMITTING HISTORY  CHIEF COMPLAINT:   Altered mental status HISTORY OF PRESENT ILLNESS:  Andrea [name]  is a 71 y.o. female with a known history of congestive heart failure, hypertension, hypothyroidism, insulin requiring diabetes mellitus and other medical problems is brought into the ED for altered mental status by her husband. Patient was found to be unresponsive at 9:30 AM today and patient was in her usual state of health until last night.Husband is at bedside and providing history , he reports patient did not have any sick contacts and recent traveling. CT head is normal. White count is elevated and patient has low-grade fever. Chest x-ray with the left base opacity We'll keep her nothing by mouth for possible aspiration and in view of encephalopathy    HOSPITAL COURSE:   [facility] a 71 y.o. femalewith a known history of congestive heart failure, hypertension, hypothyroidism, insulin requiring diabetes mellitus and other medical problems is  brought into the ED for altered mental status by her husband. Patient was found to be unresponsive at 9:30 AM today and patient was in her usual state of health until last night.Husband is at bedside and providing history  # constipation with abdominal pain. Abdominal x-ray was ordered and shows constipation. Ordered MiraLAX twice a day. We'll get fleets enema if patient does not have bowel movements today.  # COPD exacerbation Started prednisone Nebs  # Acute encephalopathy  due to CVA due to some left sided weakness Weakness is improved CT head showed no CVA. Repeat CT head ordered today shows no stroke. Cannot get MRI Due to pacemaker  Symptoms are improving.  Carotid Dopplers and echocardiogram showed nothing acute  Patient is on Eliquis due to history of atrial fibrillation.  # Acute kidney injury with hyperkalemia. Improved with IV fluids. Now stopped  # Left lower lobe pneumonia - improved on chest x-ray. No fever or cough. Normal WBC.  But does have elevated pro calcitonin and no other source of infection.  Finished abx in the hospital.  # Insulin requiring diabetes mellitus Poor oral intake initially On reduced dose of Lantus. She had blood suagrs >500 01/10/2017 after she ate a large pudding from outside. Today blood sugars are improved. 165. Will continue 30 units lantus at discharge. Can titrate up if BS uncontrolled at SNF  # Hypothyroidism On levothyroxine  # Mild elevation in troponin. Seen by cardiology. No further investigations needed.  # Hypertension, uncontrolled.  Added hydralazine. Losartan stopped due to  hyperkalemia.  Diet for Discharge Recommendation:  Dysphagia level 2 (minced, well-cut foods, moistened) d/t missing most dentition and no dentures; Thin liquids. General aspiration precautions; Reflux precautions.   Patient stable for discharge to skilled nursing facility.  CONSULTS OBTAINED:  Treatment Team:  Corey Skains,  MD Catarina Hartshorn, MD Leotis Pain, MD Anthonette Legato, MD  DRUG ALLERGIES:   Allergies  Allergen Reactions  . Watermelon Concentrate Anaphylaxis  . Ace Inhibitors   . Celebrex [Celecoxib] Rash  . Penicillins Rash  . Sulfa Antibiotics Rash    DISCHARGE MEDICATIONS:   Current Discharge Medication List    START taking these medications   Details  hydrALAZINE (APRESOLINE) 25 MG tablet Take 1 tablet (25 mg total) by mouth every 8 (eight) hours. Qty: 30 tablet, Refills: 0    polyethylene glycol (MIRALAX / GLYCOLAX) packet Take 17 g by mouth daily. Qty: 14 each, Refills: 0    predniSONE (DELTASONE) 20 MG tablet Take 1 tablet (20 mg total) by mouth daily with breakfast. Qty: 3 tablet, Refills: 0      CONTINUE these medications which have CHANGED   Details  Insulin Glargine (TOUJEO SOLOSTAR) 300 UNIT/ML SOPN Inject 30 Units into the skin daily. Qty: 1 pen, Refills: 0      CONTINUE these medications which have NOT CHANGED   Details  acetaminophen (TYLENOL) 325 MG tablet Take 650 mg by mouth 3 (three) times daily as needed.      amLODipine (NORVASC) 10 MG tablet TAKE ONE TABLET BY MOUTH ONCE DAILY *NEEDS APPOINTMENT FOR FURTHER REFILLS* Qty: 30 tablet, Refills: 0    aspirin 81 MG tablet Take 81 mg by mouth daily.    atorvastatin (LIPITOR) 20 MG tablet TAKE ONE TABLET BY MOUTH ONCE DAILY Qty: 90 tablet, Refills: 0    carvedilol (COREG) 12.5 MG tablet Take 1 tablet by mouth 2 (two) times daily with a meal.     Cholecalciferol (VITAMIN D HIGH POTENCY PO) Take 1 capsule by mouth daily.      clopidogrel (PLAVIX) 75 MG tablet Take 1 tablet (75 mg total) by mouth daily. Qty: 30 tablet, Refills: 6    ELIQUIS 5 MG TABS tablet Take 1 tablet by mouth 2 (two) times daily.    furosemide (LASIX) 40 MG tablet Take 40 mg by mouth daily.     gabapentin (NEURONTIN) 600 MG tablet Take 1 tablet by mouth 2 (two) times daily.    insulin aspart (NOVOLOG) 100 UNIT/ML injection  Inject 3-15 Units into the skin 3 (three) times daily before meals. Sliding scale Qty: 1 vial, Refills: 3   Associated Diagnoses: Diabetes mellitus type 2, uncontrolled (HCC)    levothyroxine (SYNTHROID, LEVOTHROID) 175 MCG tablet Take 1 tablet by mouth daily.    PARoxetine (PAXIL) 40 MG tablet Take 1 tablet by mouth daily.    vitamin B-12 (CYANOCOBALAMIN) 1000 MCG tablet Take 5,000 mcg by mouth daily.       STOP taking these medications     losartan (COZAAR) 50 MG tablet         Today   VITAL SIGNS:  Blood pressure (!) 148/55, pulse 61, temperature 98.7 F (37.1 C), resp. rate 20, height 5\' 3"  (1.6 m), weight 89.4 kg (197 lb), SpO2 100 %.  I/O:   Intake/Output Summary (Last 24 hours) at 01/10/17 1059 Last data filed at 01/10/17 0951  Gross per 24 hour  Intake              360 ml  Output  1 ml  Net              359 ml    PHYSICAL EXAMINATION:  Physical Exam  GENERAL:  71 y.o.-year-old patient lying in the bed with no acute distress.  LUNGS: Normal breath sounds bilaterally, no wheezing, rales,rhonchi or crepitation. No use of accessory muscles of respiration.  CARDIOVASCULAR: S1, S2 normal. No murmurs, rubs, or gallops.  ABDOMEN: Soft, non-tender, non-distended. Bowel sounds present. No organomegaly or mass.  NEUROLOGIC: Moves all 4 extremities. PSYCHIATRIC: The patient is alert and awake. SKIN: No obvious rash, lesion, or ulcer.   DATA REVIEW:   CBC  Recent Labs Lab 01/07/17 0729  WBC 6.4  HGB 9.5*  HCT 28.9*  PLT 128*    Chemistries   Recent Labs Lab 01/06/17 0419  01/08/17 0415 01/11/17 0509  NA 138  < > 138  --   K 5.5*  < > 4.9  --   CL 110  < > 114*  --   CO2 22  < > 21*  --   GLUCOSE 109*  < > 251*  --   BUN 42*  < > 54*  --   CREATININE 2.50*  < > 2.41* 2.20*  CALCIUM 8.3*  < > 8.3*  --   AST 27  --   --   --   ALT 19  --   --   --   ALKPHOS 101  --   --   --   BILITOT 0.5  --   --   --   < > = values in this interval  not displayed.  Cardiac Enzymes  Recent Labs Lab 01/06/17 0419  TROPONINI 0.06*    Microbiology Results  Results for orders placed or performed during the hospital encounter of 01/05/17  Blood Culture (routine x 2)     Status: None   Collection Time: 01/05/17 11:35 AM  Result Value Ref Range Status   Specimen Description BLOOD BLOOD RIGHT FOREARM  Final   Special Requests   Final    BOTTLES DRAWN AEROBIC AND ANAEROBIC Blood Culture adequate volume   Culture NO GROWTH 5 DAYS  Final   Report Status 01/10/2017 FINAL  Final  Blood Culture (routine x 2)     Status: None   Collection Time: 01/05/17 11:40 AM  Result Value Ref Range Status   Specimen Description BLOOD BLOOD LEFT HAND  Final   Special Requests   Final    BOTTLES DRAWN AEROBIC AND ANAEROBIC Blood Culture adequate volume   Culture NO GROWTH 5 DAYS  Final   Report Status 01/10/2017 FINAL  Final    RADIOLOGY:  Dg Abd 2 Views  Result Date: 01/09/2017 CLINICAL DATA:  Abdominal pain. EXAM: ABDOMEN - 2 VIEW COMPARISON:  Abdominal ultrasound 01/08/2017 FINDINGS: The bowel gas pattern is normal. There is no evidence of free air. No radio-opaque calculi or other significant radiographic abnormality is seen. Moderate amount of stool throughout the colon. Partial visualization of the thorax demonstrates enlarged cardiac silhouette with probable left pleural effusion with associated atelectasis versus airspace consolidation. IMPRESSION: Nonobstructive bowel gas pattern. Constipation. Enlarged cardiac silhouette. Small left pleural effusion with left lower lobe atelectasis versus airspace consolidation. Electronically Signed   By: Fidela Salisbury M.D.   On: 01/09/2017 16:07    Follow up with PCP in 1 week.  Management plans discussed with the patient, family and they are in agreement.  CODE STATUS:     Code Status Orders  Start     Ordered   01/05/17 1630  Full code  Continuous     01/05/17 1629    Code Status  History    Date Active Date Inactive Code Status Order ID Comments User Context   06/18/2016  3:29 AM 06/19/2016  7:54 PM Full Code [id number]  Saundra Shelling, MD Inpatient      TOTAL TIME TAKING CARE OF THIS PATIENT ON DAY OF DISCHARGE: more than 30 minutes.   Hillary Bow R M.D on 01/11/2017 at 9:08 AM  Between 7am to 6pm - Pager - [phone]  After 6pm go to [email or web address] - password [email or web address] [place] [facility]  Office  [phone]  CC: Primary care physician; Patient, No Pcp Per  Note: This dictation was prepared with Dragon dictation along with smaller phrase technology. Any transcriptional errors that result from this process are unintentional.

## 2017-01-10 NOTE — Progress Notes (Signed)
Blood sugar's elevated greater than 500. Due to patient being back to normal mental status and better appetite. Initially her Lantus dose was reduced to 15 units from 60 units. Will cover with sliding scale insulin. Increase bedtime Lantus dose to 30 units. Patient will likely have uncontrolled blood sugars for some more time. Would try to avoid high doses which can complicate with hypoglycemia. Hold discharge today. Patient will be  discharged in the morning once blood sugar's improve.

## 2017-01-10 NOTE — Progress Notes (Signed)
[facility]  ROUNDING NOTE   Subjective:  No new renal function testing today. Patient resting comfortably in bed. Patient to be transitioned to rehabilitation center.  Objective:  Vital signs in last 24 hours:  Temp:  [97.8 F (36.6 C)-98.7 F (37.1 C)] 98.7 F (37.1 C) (08/30 0435) Pulse Rate:  [58-62] 61 (08/30 0435) Resp:  [18-20] 20 (08/30 0435) BP: (125-155)/(55-81) 148/55 (08/30 0435) SpO2:  [98 %-100 %] 100 % (08/30 0435)  Weight change:  Filed Weights   01/05/17 1112  Weight: 89.4 kg (197 lb)    Intake/Output: I/O last 3 completed shifts: In: 360 [P.O.:360] Out: 1 [Urine:1]   Intake/Output this shift:  Total I/O In: 240 [P.O.:240] Out: -   Physical Exam: General: No acute distress  Head: Normocephalic, atraumatic. Moist oral mucosal membranes  Eyes: Anicteric  Neck: Supple, trachea midline  Lungs:  Clear to auscultation, normal effort  Heart: S1S2 no rubs  Abdomen:  Soft, nontender, bowel sounds present  Extremities: No peripheral edema.  Neurologic: Awake, alert, Confused at times   Skin: No lesions       Basic Metabolic Panel:  Recent Labs Lab 01/05/17 1107 01/06/17 0419 01/07/17 0502 01/08/17 0415  NA 137 138 137 138  K 5.1 5.5* 5.4* 4.9  CL 108 110 111 114*  CO2 23 22 20* 21*  GLUCOSE 167* 109* 147* 251*  BUN 37* 42* 50* 54*  CREATININE 2.08* 2.50* 2.84* 2.41*  CALCIUM 8.8* 8.3* 8.4* 8.3*  PHOS  --   --   --  4.4    Liver Function Tests:  Recent Labs Lab 01/05/17 1107 01/06/17 0419  AST 45* 27  ALT 22 19  ALKPHOS 107 101  BILITOT 0.4 0.5  PROT 6.4* 5.6*  ALBUMIN 3.1* 2.5*   No results for input(s): LIPASE, AMYLASE in the last 168 hours.  Recent Labs Lab 01/07/17 0502  AMMONIA 10    CBC:  Recent Labs Lab 01/05/17 1107 01/06/17 0419 01/07/17 0729  WBC 11.3* 7.8 6.4  NEUTROABS 8.6*  --  4.6  HGB 11.2* 10.1* 9.5*  HCT 33.9* 30.4* 28.9*  MCV 82.8 85.1 85.6  PLT 156 121* 128*    Cardiac  Enzymes:  Recent Labs Lab 01/05/17 1709 01/05/17 2214 01/06/17 0419 01/07/17 0729  CKTOTAL  --   --   --  56  TROPONINI 0.07* 0.06* 0.06*  --     BNP: Invalid input(s): POCBNP  CBG:  Recent Labs Lab 01/09/17 0746 01/09/17 1132 01/09/17 1649 01/09/17 2041 01/10/17 0733  GLUCAP 214* 222* 187* 175* 167*    Microbiology: Results for orders placed or performed during the hospital encounter of 01/05/17  Blood Culture (routine x 2)     Status: None   Collection Time: 01/05/17 11:35 AM  Result Value Ref Range Status   Specimen Description BLOOD BLOOD RIGHT FOREARM  Final   Special Requests   Final    BOTTLES DRAWN AEROBIC AND ANAEROBIC Blood Culture adequate volume   Culture NO GROWTH 5 DAYS  Final   Report Status 01/10/2017 FINAL  Final  Blood Culture (routine x 2)     Status: None   Collection Time: 01/05/17 11:40 AM  Result Value Ref Range Status   Specimen Description BLOOD BLOOD LEFT HAND  Final   Special Requests   Final    BOTTLES DRAWN AEROBIC AND ANAEROBIC Blood Culture adequate volume   Culture NO GROWTH 5 DAYS  Final   Report Status 01/10/2017 FINAL  Final  Coagulation Studies: No results for input(s): LABPROT, INR in the last 72 hours.  Urinalysis: No results for input(s): COLORURINE, LABSPEC, PHURINE, GLUCOSEU, HGBUR, BILIRUBINUR, KETONESUR, PROTEINUR, UROBILINOGEN, NITRITE, LEUKOCYTESUR in the last 72 hours.  Invalid input(s): APPERANCEUR    Imaging: US Renal  Result Date: 01/08/2017 CLINICAL DATA:  Acute renal insufficiency. Known left renal atrophy. EXAM: RENAL / URINARY TRACT ULTRASOUND COMPLETE COMPARISON:  10.8 cm FINDINGS: Right Kidney: Length: 10.8 cm. The renal cortical echotexture is greater than that of the adjacent liver. There is no hydronephrosis nor focal mass. No stones are evident. Left Kidney: Length: 6.8 cm. The cortical echoetexture is increased. There is diffuse cortical thinning. There is no hydronephrosis nor cystic or solid  mass observed. Bladder: The partially distended urinary bladder is normal. IMPRESSION: Chronic left renal atrophy. Increased renal cortical echotexture bilaterally consistent with medical renal disease. There is no hydronephrosis. The urinary bladder is unremarkable. Electronically Signed   By: David  Martinique M.D.   On: 01/08/2017 10:31   US Carotid Bilateral  Result Date: 01/08/2017 CLINICAL DATA:  CVA. EXAM: BILATERAL CAROTID DUPLEX ULTRASOUND TECHNIQUE: Pearline Cables scale imaging, color Doppler and duplex ultrasound were performed of bilateral carotid and vertebral arteries in the neck. COMPARISON:  CT 01/07/2017.  CT 11/16/2011. FINDINGS: Criteria: Quantification of carotid stenosis is based on velocity parameters that correlate the residual internal carotid diameter with NASCET-based stenosis levels, using the diameter of the distal internal carotid lumen as the denominator for stenosis measurement. Exam very limited due to patient's body habitus and motion. The following velocity measurements were obtained: RIGHT ICA:  30/11 cm/sec CCA:  16/10 cm/sec SYSTOLIC ICA/CCA RATIO:  0.6 DIASTOLIC ICA/CCA RATIO:  1.0 ECA:  64 cm/sec LEFT ICA:  61/12 cm/sec CCA:  96/04 cm/sec SYSTOLIC ICA/CCA RATIO:  0.7 DIASTOLIC ICA/CCA RATIO:  0.7 ECA:  57 cm/sec RIGHT CAROTID ARTERY: Mild multifocal atherosclerotic vascular plaque. Prominent calcific plaque right carotid bifurcation noted on prior CT of 11/16/2011 no longer identified. RIGHT VERTEBRAL ARTERY:  Patent with antegrade flow. LEFT CAROTID ARTERY: Mild multifocal atherosclerotic vascular plaque. No flow limiting stenosis. LEFT VERTEBRAL ARTERY:  Patent with antegrade flow. IMPRESSION: 1. Mild bilateral multifocal atherosclerotic vascular plaque. No flow limiting stenosis. Degree of stenosis less than 50% bilaterally. 2. Vertebrals are patent antegrade flow. Exam very limited due to patient's body habitus and motion. Electronically Signed   By: Marcello Moores  Register   On: 01/08/2017  10:48   Dg Abd 2 Views  Result Date: 01/09/2017 CLINICAL DATA:  Abdominal pain. EXAM: ABDOMEN - 2 VIEW COMPARISON:  Abdominal ultrasound 01/08/2017 FINDINGS: The bowel gas pattern is normal. There is no evidence of free air. No radio-opaque calculi or other significant radiographic abnormality is seen. Moderate amount of stool throughout the colon. Partial visualization of the thorax demonstrates enlarged cardiac silhouette with probable left pleural effusion with associated atelectasis versus airspace consolidation. IMPRESSION: Nonobstructive bowel gas pattern. Constipation. Enlarged cardiac silhouette. Small left pleural effusion with left lower lobe atelectasis versus airspace consolidation. Electronically Signed   By: Fidela Salisbury M.D.   On: 01/09/2017 16:07     Medications:    . amLODipine  10 mg Oral Daily  . apixaban  2.5 mg Oral BID  . atorvastatin  20 mg Oral q1800  . carvedilol  12.5 mg Oral BID WC  . clopidogrel  75 mg Oral Daily  . furosemide  40 mg Oral Daily  . hydrALAZINE  25 mg Oral Q8H  . insulin aspart  0-5 Units Subcutaneous QHS  .  insulin aspart  0-9 Units Subcutaneous TID WC  . insulin glargine  15 Units Subcutaneous Daily  . levothyroxine  150 mcg Oral QAC breakfast  . mouth rinse  15 mL Mouth Rinse BID  . PARoxetine  40 mg Oral Daily  . polyethylene glycol  17 g Oral BID  . predniSONE  50 mg Oral Q breakfast  . vitamin B-12  1,000 mcg Oral Daily   acetaminophen **OR** acetaminophen, ipratropium-albuterol, metoprolol tartrate, ondansetron **OR** ondansetron (ZOFRAN) IV  Assessment/ Plan:  71 y.o. female with a PMHx of Congestive heart failure, hypertension, hypothyroidism, diabetes mellitus2, nonalcoholic fatty liver disease, obesity, peptic ulcer disease, chronic kidney disease stage IV, who was admitted to [facility] on 01/05/2017 for evaluation of altered mental status.   1. Acute renal failure/chronic kidney disease stage IV baseline creatinine  2.3/proteinuria. The patient has not seen a nephrologist in the past. She does have advanced renal dysfunction at baseline.  Atrophic right kidney noted at 6.8cm.  -  No new renal function testing today. Patient will need continued monitoring her renal function as an outpatient.  2. Anemia of chronic kidney disease. Recheck hemoglobin in the office. She Marten need Epogen as an outpatient.  3. Hypertension. Blood pressure now down to 148/55.  Continue amlodipine, carvedilol, hydralazine.  4. Altered mental status.  Now resolved.   LOS: 5 Natilee Gauer 8/30/201810:07 AM

## 2017-01-10 NOTE — Progress Notes (Signed)
Blood sugar trends this evening: 1632: 543 - 15 units Novolog given  1756: 550 - 12 units Novolog given 1940: 497 (right hand) 1941: 528 (left hand)  On call MD, Dr. Margaretmary Eddy, notified blood sugar still elevated. Bedtime sliding scale Novolog to still be given. See new orders. Will continue to closely monitor.

## 2017-01-10 NOTE — Progress Notes (Signed)
Pt's sugar's are still very high, last reading was 550. MD aware, new orders put in. Pt will stay another night in the hospital. Patient's husband was notified as well as Radiographer, therapeutic.

## 2017-01-10 NOTE — Progress Notes (Signed)
Pt is pending discharge, report called to [facility]. All belongings packed and returned to the patient, she was changed into a transfer pack. Her sugar was checked prior to calling EMS and the result was 543. MD made aware, orders put in. Will recheck in one hour to see if she will still be discharged.

## 2017-01-10 NOTE — Clinical Social Work Note (Signed)
CSW received phone call from [facility], they have received insurance authorization for patient to discharge today.  Patient to be d/c'ed today to [facility].  Patient and family agreeable to plans will transport via ems RN to call report [phone] room 210B.  Evette Cristal, MSW, [place]

## 2017-01-11 DIAGNOSIS — J441 Chronic obstructive pulmonary disease with (acute) exacerbation: Secondary | ICD-10-CM | POA: Diagnosis not present

## 2017-01-11 DIAGNOSIS — I441 Atrioventricular block, second degree: Secondary | ICD-10-CM | POA: Diagnosis not present

## 2017-01-11 DIAGNOSIS — D631 Anemia in chronic kidney disease: Secondary | ICD-10-CM | POA: Diagnosis not present

## 2017-01-11 DIAGNOSIS — G9340 Encephalopathy, unspecified: Secondary | ICD-10-CM | POA: Diagnosis not present

## 2017-01-11 DIAGNOSIS — R6889 Other general symptoms and signs: Secondary | ICD-10-CM | POA: Diagnosis not present

## 2017-01-11 DIAGNOSIS — I639 Cerebral infarction, unspecified: Secondary | ICD-10-CM | POA: Diagnosis not present

## 2017-01-11 DIAGNOSIS — I69398 Other sequelae of cerebral infarction: Secondary | ICD-10-CM | POA: Diagnosis not present

## 2017-01-11 DIAGNOSIS — Z9911 Dependence on respirator [ventilator] status: Secondary | ICD-10-CM | POA: Diagnosis not present

## 2017-01-11 DIAGNOSIS — E1140 Type 2 diabetes mellitus with diabetic neuropathy, unspecified: Secondary | ICD-10-CM | POA: Diagnosis not present

## 2017-01-11 DIAGNOSIS — N183 Chronic kidney disease, stage 3 (moderate): Secondary | ICD-10-CM | POA: Diagnosis not present

## 2017-01-11 DIAGNOSIS — E1122 Type 2 diabetes mellitus with diabetic chronic kidney disease: Secondary | ICD-10-CM | POA: Diagnosis not present

## 2017-01-11 DIAGNOSIS — I5032 Chronic diastolic (congestive) heart failure: Secondary | ICD-10-CM | POA: Diagnosis not present

## 2017-01-11 DIAGNOSIS — I110 Hypertensive heart disease with heart failure: Secondary | ICD-10-CM | POA: Diagnosis not present

## 2017-01-11 DIAGNOSIS — I480 Paroxysmal atrial fibrillation: Secondary | ICD-10-CM | POA: Diagnosis not present

## 2017-01-11 DIAGNOSIS — I4891 Unspecified atrial fibrillation: Secondary | ICD-10-CM | POA: Diagnosis not present

## 2017-01-11 DIAGNOSIS — K5900 Constipation, unspecified: Secondary | ICD-10-CM | POA: Diagnosis not present

## 2017-01-11 DIAGNOSIS — N179 Acute kidney failure, unspecified: Secondary | ICD-10-CM | POA: Diagnosis not present

## 2017-01-11 DIAGNOSIS — I10 Essential (primary) hypertension: Secondary | ICD-10-CM | POA: Diagnosis not present

## 2017-01-11 DIAGNOSIS — N184 Chronic kidney disease, stage 4 (severe): Secondary | ICD-10-CM | POA: Diagnosis not present

## 2017-01-11 DIAGNOSIS — M6281 Muscle weakness (generalized): Secondary | ICD-10-CM | POA: Diagnosis not present

## 2017-01-11 LAB — CREATININE, SERUM
Creatinine, Ser: 2.2 mg/dL — ABNORMAL HIGH (ref 0.44–1.00)
GFR calc non Af Amer: 21 mL/min — ABNORMAL LOW (ref >60)
GFR, EST AFRICAN AMERICAN: 25 mL/min — AB (ref >60)

## 2017-01-11 LAB — GLUCOSE, CAPILLARY
GLUCOSE-CAPILLARY: 165 mg/dL — AB (ref 65–99)
Glucose-Capillary: 252 mg/dL — ABNORMAL HIGH (ref 65–99)

## 2017-01-11 MED ORDER — INSULIN GLARGINE 300 UNIT/ML ~~LOC~~ SOPN: 30.0000 [IU] | PEN_INJECTOR | Freq: Every day | SUBCUTANEOUS | 0 refills | Status: AC

## 2017-01-11 NOTE — Progress Notes (Signed)
Patient transported via EMS.  Husband made aware/  Clarise Cruz, RN

## 2017-01-11 NOTE — Progress Notes (Signed)
[facility]  ROUNDING NOTE   Subjective:  Renal function has improved. Cr down to 2.2.  Feeling better since admission.   Objective:  Vital signs in last 24 hours:  Temp:  [97.6 F (36.4 C)-98.6 F (37 C)] 98 F (36.7 C) (08/31 0539) Pulse Rate:  [58-64] 62 (08/31 0811) Resp:  [16-20] 18 (08/31 0811) BP: (125-175)/(58-73) 175/73 (08/31 0811) SpO2:  [98 %-100 %] 98 % (08/31 0811)  Weight change:  Filed Weights   01/05/17 1112  Weight: 89.4 kg (197 lb)    Intake/Output: I/O last 3 completed shifts: In: 240 [P.O.:240] Out: -    Intake/Output this shift:  Total I/O In: 240 [P.O.:240] Out: -   Physical Exam: General: No acute distress  Head: Normocephalic, atraumatic. Moist oral mucosal membranes  Eyes: Anicteric  Neck: Supple, trachea midline  Lungs:  Clear to auscultation, normal effort  Heart: S1S2 no rubs  Abdomen:  Soft, nontender, bowel sounds present  Extremities: No peripheral edema.  Neurologic: Awake, alert, Confused at times   Skin: No lesions       Basic Metabolic Panel:  Recent Labs Lab 01/05/17 1107 01/06/17 0419 01/07/17 0502 01/08/17 0415 01/11/17 0509  NA 137 138 137 138  --   K 5.1 5.5* 5.4* 4.9  --   CL 108 110 111 114*  --   CO2 23 22 20* 21*  --   GLUCOSE 167* 109* 147* 251*  --   BUN 37* 42* 50* 54*  --   CREATININE 2.08* 2.50* 2.84* 2.41* 2.20*  CALCIUM 8.8* 8.3* 8.4* 8.3*  --   PHOS  --   --   --  4.4  --     Liver Function Tests:  Recent Labs Lab 01/05/17 1107 01/06/17 0419  AST 45* 27  ALT 22 19  ALKPHOS 107 101  BILITOT 0.4 0.5  PROT 6.4* 5.6*  ALBUMIN 3.1* 2.5*   No results for input(s): LIPASE, AMYLASE in the last 168 hours.  Recent Labs Lab 01/07/17 0502  AMMONIA 10    CBC:  Recent Labs Lab 01/05/17 1107 01/06/17 0419 01/07/17 0729  WBC 11.3* 7.8 6.4  NEUTROABS 8.6*  --  4.6  HGB 11.2* 10.1* 9.5*  HCT 33.9* 30.4* 28.9*  MCV 82.8 85.1 85.6  PLT 156 121* 128*    Cardiac  Enzymes:  Recent Labs Lab 01/05/17 1709 01/05/17 2214 01/06/17 0419 01/07/17 0729  CKTOTAL  --   --   --  56  TROPONINI 0.07* 0.06* 0.06*  --     BNP: Invalid input(s): POCBNP  CBG:  Recent Labs Lab 01/10/17 1756 01/10/17 1940 01/10/17 1941 01/10/17 2148 01/11/17 0743  GLUCAP 550* 497* 528* 395* 165*    Microbiology: Results for orders placed or performed during the hospital encounter of 01/05/17  Blood Culture (routine x 2)     Status: None   Collection Time: 01/05/17 11:35 AM  Result Value Ref Range Status   Specimen Description BLOOD BLOOD RIGHT FOREARM  Final   Special Requests   Final    BOTTLES DRAWN AEROBIC AND ANAEROBIC Blood Culture adequate volume   Culture NO GROWTH 5 DAYS  Final   Report Status 01/10/2017 FINAL  Final  Blood Culture (routine x 2)     Status: None   Collection Time: 01/05/17 11:40 AM  Result Value Ref Range Status   Specimen Description BLOOD BLOOD LEFT HAND  Final   Special Requests   Final    BOTTLES DRAWN AEROBIC AND  ANAEROBIC Blood Culture adequate volume   Culture NO GROWTH 5 DAYS  Final   Report Status 01/10/2017 FINAL  Final    Coagulation Studies: No results for input(s): LABPROT, INR in the last 72 hours.  Urinalysis: No results for input(s): COLORURINE, LABSPEC, PHURINE, GLUCOSEU, HGBUR, BILIRUBINUR, KETONESUR, PROTEINUR, UROBILINOGEN, NITRITE, LEUKOCYTESUR in the last 72 hours.  Invalid input(s): APPERANCEUR    Imaging: Dg Abd 2 Views  Result Date: 01/09/2017 CLINICAL DATA:  Abdominal pain. EXAM: ABDOMEN - 2 VIEW COMPARISON:  Abdominal ultrasound 01/08/2017 FINDINGS: The bowel gas pattern is normal. There is no evidence of free air. No radio-opaque calculi or other significant radiographic abnormality is seen. Moderate amount of stool throughout the colon. Partial visualization of the thorax demonstrates enlarged cardiac silhouette with probable left pleural effusion with associated atelectasis versus airspace  consolidation. IMPRESSION: Nonobstructive bowel gas pattern. Constipation. Enlarged cardiac silhouette. Small left pleural effusion with left lower lobe atelectasis versus airspace consolidation. Electronically Signed   By: Fidela Salisbury M.D.   On: 01/09/2017 16:07     Medications:    . amLODipine  10 mg Oral Daily  . apixaban  2.5 mg Oral BID  . atorvastatin  20 mg Oral q1800  . carvedilol  12.5 mg Oral BID WC  . clopidogrel  75 mg Oral Daily  . furosemide  40 mg Oral Daily  . hydrALAZINE  25 mg Oral Q8H  . insulin aspart  0-5 Units Subcutaneous QHS  . insulin aspart  0-9 Units Subcutaneous TID WC  . insulin glargine  30 Units Subcutaneous Daily  . levothyroxine  150 mcg Oral QAC breakfast  . mouth rinse  15 mL Mouth Rinse BID  . PARoxetine  40 mg Oral Daily  . polyethylene glycol  17 g Oral BID  . predniSONE  50 mg Oral Q breakfast  . vitamin B-12  1,000 mcg Oral Daily   acetaminophen **OR** acetaminophen, ipratropium-albuterol, metoprolol tartrate, ondansetron **OR** ondansetron (ZOFRAN) IV, sodium phosphate  Assessment/ Plan:  71 y.o. female with a PMHx of Congestive heart failure, hypertension, hypothyroidism, diabetes mellitus2, nonalcoholic fatty liver disease, obesity, peptic ulcer disease, chronic kidney disease stage IV, who was admitted to [facility] on 01/05/2017 for evaluation of altered mental status.   1. Acute renal failure/chronic kidney disease stage IV baseline creatinine 2.3/proteinuria. The patient has not seen a nephrologist in the past. She does have advanced renal dysfunction at baseline.  Atrophic right kidney noted at 6.8cm.  -  Creatinine down to 2.2.  At baseline now.  Will plan to monitor kidney function as an outpt.  2. Anemia of chronic kidney disease.  Follow hgb as an outpt.   3. Hypertension. Blood pressure labile, continue to monitor as outpt.    4. Altered mental status. At baseline.     LOS: 6 Minnette Merida 8/31/201810:39 AM

## 2017-01-11 NOTE — Care Management Important Message (Signed)
Important Message  Patient Details  Name: Andrea [name] MRN: [id number] Date of Birth: 07-12-1945   Medicare Important Message Given:  Yes    Marshell Garfinkel, RN 01/11/2017, 8:14 AM

## 2017-01-11 NOTE — Progress Notes (Signed)
Report called to Rocky Link, RN at  [facility] and EMS contacted for transport.  Clarise Cruz, RN

## 2017-01-11 NOTE — Clinical Social Work Note (Signed)
Patient's blood sugars were high last night so patient did not discharge.    Patient to be d/c'ed today to [facility].  Patient and family agreeable to plans will transport via ems RN to call report.  Evette Cristal, MSW, [place]

## 2017-01-12 ENCOUNTER — Encounter
Admission: RE | Admit: 2017-01-12 | Discharge: 2017-01-12 | Disposition: A | Discharge status: Home / Self Care | Payer: Medicare HMO | Source: Ambulatory Visit | Attending: Internal Medicine | Admitting: Internal Medicine

## 2017-01-14 DIAGNOSIS — I5033 Acute on chronic diastolic (congestive) heart failure: Secondary | ICD-10-CM | POA: Diagnosis not present

## 2017-01-14 DIAGNOSIS — Z95810 Presence of automatic (implantable) cardiac defibrillator: Secondary | ICD-10-CM | POA: Diagnosis not present

## 2017-01-14 DIAGNOSIS — E785 Hyperlipidemia, unspecified: Secondary | ICD-10-CM | POA: Diagnosis not present

## 2017-01-14 DIAGNOSIS — N189 Chronic kidney disease, unspecified: Secondary | ICD-10-CM | POA: Diagnosis not present

## 2017-01-14 DIAGNOSIS — N182 Chronic kidney disease, stage 2 (mild): Secondary | ICD-10-CM | POA: Diagnosis not present

## 2017-01-14 DIAGNOSIS — E0849 Diabetes mellitus due to underlying condition with other diabetic neurological complication: Secondary | ICD-10-CM | POA: Diagnosis not present

## 2017-01-14 DIAGNOSIS — R1310 Dysphagia, unspecified: Secondary | ICD-10-CM | POA: Diagnosis not present

## 2017-01-14 DIAGNOSIS — R0600 Dyspnea, unspecified: Secondary | ICD-10-CM | POA: Diagnosis not present

## 2017-01-14 DIAGNOSIS — T82837A Hemorrhage of cardiac prosthetic devices, implants and grafts, initial encounter: Secondary | ICD-10-CM | POA: Diagnosis not present

## 2017-01-14 DIAGNOSIS — N179 Acute kidney failure, unspecified: Secondary | ICD-10-CM | POA: Diagnosis not present

## 2017-01-14 DIAGNOSIS — I5031 Acute diastolic (congestive) heart failure: Secondary | ICD-10-CM | POA: Diagnosis not present

## 2017-01-14 DIAGNOSIS — R5381 Other malaise: Secondary | ICD-10-CM | POA: Diagnosis not present

## 2017-01-14 DIAGNOSIS — N184 Chronic kidney disease, stage 4 (severe): Secondary | ICD-10-CM | POA: Diagnosis not present

## 2017-01-14 DIAGNOSIS — R918 Other nonspecific abnormal finding of lung field: Secondary | ICD-10-CM | POA: Diagnosis not present

## 2017-01-14 DIAGNOSIS — Z45018 Encounter for adjustment and management of other part of cardiac pacemaker: Secondary | ICD-10-CM | POA: Diagnosis not present

## 2017-01-14 DIAGNOSIS — J449 Chronic obstructive pulmonary disease, unspecified: Secondary | ICD-10-CM | POA: Diagnosis not present

## 2017-01-14 DIAGNOSIS — R0602 Shortness of breath: Secondary | ICD-10-CM | POA: Diagnosis not present

## 2017-01-14 DIAGNOSIS — R262 Difficulty in walking, not elsewhere classified: Secondary | ICD-10-CM | POA: Diagnosis not present

## 2017-01-14 DIAGNOSIS — E1122 Type 2 diabetes mellitus with diabetic chronic kidney disease: Secondary | ICD-10-CM | POA: Diagnosis not present

## 2017-01-14 DIAGNOSIS — R41841 Cognitive communication deficit: Secondary | ICD-10-CM | POA: Diagnosis not present

## 2017-01-14 DIAGNOSIS — I519 Heart disease, unspecified: Secondary | ICD-10-CM | POA: Diagnosis not present

## 2017-01-14 DIAGNOSIS — I517 Cardiomegaly: Secondary | ICD-10-CM | POA: Diagnosis not present

## 2017-01-14 DIAGNOSIS — E875 Hyperkalemia: Secondary | ICD-10-CM | POA: Diagnosis not present

## 2017-01-14 DIAGNOSIS — R279 Unspecified lack of coordination: Secondary | ICD-10-CM | POA: Diagnosis not present

## 2017-01-14 DIAGNOSIS — J90 Pleural effusion, not elsewhere classified: Secondary | ICD-10-CM | POA: Diagnosis not present

## 2017-01-14 DIAGNOSIS — R1312 Dysphagia, oropharyngeal phase: Secondary | ICD-10-CM | POA: Diagnosis not present

## 2017-01-14 DIAGNOSIS — I312 Hemopericardium, not elsewhere classified: Secondary | ICD-10-CM | POA: Diagnosis not present

## 2017-01-14 DIAGNOSIS — Z4682 Encounter for fitting and adjustment of non-vascular catheter: Secondary | ICD-10-CM | POA: Diagnosis not present

## 2017-01-14 DIAGNOSIS — I509 Heart failure, unspecified: Secondary | ICD-10-CM | POA: Diagnosis not present

## 2017-01-14 DIAGNOSIS — I340 Nonrheumatic mitral (valve) insufficiency: Secondary | ICD-10-CM | POA: Diagnosis not present

## 2017-01-14 DIAGNOSIS — I4891 Unspecified atrial fibrillation: Secondary | ICD-10-CM | POA: Diagnosis not present

## 2017-01-14 DIAGNOSIS — I313 Pericardial effusion (noninflammatory): Secondary | ICD-10-CM | POA: Diagnosis not present

## 2017-01-14 DIAGNOSIS — Z794 Long term (current) use of insulin: Secondary | ICD-10-CM | POA: Diagnosis not present

## 2017-01-14 DIAGNOSIS — I130 Hypertensive heart and chronic kidney disease with heart failure and stage 1 through stage 4 chronic kidney disease, or unspecified chronic kidney disease: Secondary | ICD-10-CM | POA: Diagnosis not present

## 2017-01-14 DIAGNOSIS — I129 Hypertensive chronic kidney disease with stage 1 through stage 4 chronic kidney disease, or unspecified chronic kidney disease: Secondary | ICD-10-CM | POA: Diagnosis not present

## 2017-01-14 DIAGNOSIS — J9811 Atelectasis: Secondary | ICD-10-CM | POA: Diagnosis not present

## 2017-01-14 DIAGNOSIS — Z5189 Encounter for other specified aftercare: Secondary | ICD-10-CM | POA: Diagnosis not present

## 2017-01-14 DIAGNOSIS — R0609 Other forms of dyspnea: Secondary | ICD-10-CM | POA: Diagnosis not present

## 2017-01-14 DIAGNOSIS — I5043 Acute on chronic combined systolic (congestive) and diastolic (congestive) heart failure: Secondary | ICD-10-CM | POA: Diagnosis not present

## 2017-01-14 DIAGNOSIS — E119 Type 2 diabetes mellitus without complications: Secondary | ICD-10-CM | POA: Diagnosis not present

## 2017-01-14 DIAGNOSIS — I480 Paroxysmal atrial fibrillation: Secondary | ICD-10-CM | POA: Diagnosis not present

## 2017-01-14 DIAGNOSIS — I110 Hypertensive heart disease with heart failure: Secondary | ICD-10-CM | POA: Diagnosis not present

## 2017-01-14 DIAGNOSIS — G4733 Obstructive sleep apnea (adult) (pediatric): Secondary | ICD-10-CM | POA: Diagnosis not present

## 2017-01-14 DIAGNOSIS — R9431 Abnormal electrocardiogram [ECG] [EKG]: Secondary | ICD-10-CM | POA: Diagnosis not present

## 2017-01-14 DIAGNOSIS — M6281 Muscle weakness (generalized): Secondary | ICD-10-CM | POA: Diagnosis not present

## 2017-01-14 DIAGNOSIS — I10 Essential (primary) hypertension: Secondary | ICD-10-CM | POA: Diagnosis not present

## 2017-01-29 DIAGNOSIS — J449 Chronic obstructive pulmonary disease, unspecified: Secondary | ICD-10-CM | POA: Diagnosis not present

## 2017-01-29 DIAGNOSIS — E875 Hyperkalemia: Secondary | ICD-10-CM | POA: Diagnosis not present

## 2017-01-29 DIAGNOSIS — E0849 Diabetes mellitus due to underlying condition with other diabetic neurological complication: Secondary | ICD-10-CM | POA: Diagnosis not present

## 2017-01-29 DIAGNOSIS — E1149 Type 2 diabetes mellitus with other diabetic neurological complication: Secondary | ICD-10-CM | POA: Diagnosis not present

## 2017-01-29 DIAGNOSIS — I517 Cardiomegaly: Secondary | ICD-10-CM | POA: Diagnosis not present

## 2017-01-29 DIAGNOSIS — I130 Hypertensive heart and chronic kidney disease with heart failure and stage 1 through stage 4 chronic kidney disease, or unspecified chronic kidney disease: Secondary | ICD-10-CM | POA: Diagnosis not present

## 2017-01-29 DIAGNOSIS — R5383 Other fatigue: Secondary | ICD-10-CM | POA: Diagnosis not present

## 2017-01-29 DIAGNOSIS — I701 Atherosclerosis of renal artery: Secondary | ICD-10-CM | POA: Diagnosis not present

## 2017-01-29 DIAGNOSIS — R9431 Abnormal electrocardiogram [ECG] [EKG]: Secondary | ICD-10-CM | POA: Diagnosis not present

## 2017-01-29 DIAGNOSIS — I313 Pericardial effusion (noninflammatory): Secondary | ICD-10-CM | POA: Diagnosis not present

## 2017-01-29 DIAGNOSIS — N184 Chronic kidney disease, stage 4 (severe): Secondary | ICD-10-CM | POA: Diagnosis not present

## 2017-01-29 DIAGNOSIS — R5381 Other malaise: Secondary | ICD-10-CM | POA: Diagnosis not present

## 2017-01-29 DIAGNOSIS — D509 Iron deficiency anemia, unspecified: Secondary | ICD-10-CM | POA: Diagnosis not present

## 2017-01-29 DIAGNOSIS — I480 Paroxysmal atrial fibrillation: Secondary | ICD-10-CM | POA: Diagnosis not present

## 2017-01-29 DIAGNOSIS — Z7901 Long term (current) use of anticoagulants: Secondary | ICD-10-CM | POA: Diagnosis not present

## 2017-01-29 DIAGNOSIS — R279 Unspecified lack of coordination: Secondary | ICD-10-CM | POA: Diagnosis not present

## 2017-01-29 DIAGNOSIS — J90 Pleural effusion, not elsewhere classified: Secondary | ICD-10-CM | POA: Diagnosis not present

## 2017-01-29 DIAGNOSIS — I309 Acute pericarditis, unspecified: Secondary | ICD-10-CM | POA: Diagnosis not present

## 2017-01-29 DIAGNOSIS — M6281 Muscle weakness (generalized): Secondary | ICD-10-CM | POA: Diagnosis not present

## 2017-01-29 DIAGNOSIS — I4891 Unspecified atrial fibrillation: Secondary | ICD-10-CM | POA: Diagnosis not present

## 2017-01-29 DIAGNOSIS — R531 Weakness: Secondary | ICD-10-CM | POA: Diagnosis not present

## 2017-01-29 DIAGNOSIS — M549 Dorsalgia, unspecified: Secondary | ICD-10-CM | POA: Diagnosis not present

## 2017-01-29 DIAGNOSIS — I509 Heart failure, unspecified: Secondary | ICD-10-CM | POA: Diagnosis not present

## 2017-01-29 DIAGNOSIS — E1122 Type 2 diabetes mellitus with diabetic chronic kidney disease: Secondary | ICD-10-CM | POA: Diagnosis not present

## 2017-01-29 DIAGNOSIS — N183 Chronic kidney disease, stage 3 (moderate): Secondary | ICD-10-CM | POA: Diagnosis not present

## 2017-01-29 DIAGNOSIS — E785 Hyperlipidemia, unspecified: Secondary | ICD-10-CM | POA: Diagnosis not present

## 2017-01-29 DIAGNOSIS — N179 Acute kidney failure, unspecified: Secondary | ICD-10-CM | POA: Diagnosis not present

## 2017-01-29 DIAGNOSIS — R262 Difficulty in walking, not elsewhere classified: Secondary | ICD-10-CM | POA: Diagnosis not present

## 2017-01-29 DIAGNOSIS — N390 Urinary tract infection, site not specified: Secondary | ICD-10-CM | POA: Diagnosis not present

## 2017-01-29 DIAGNOSIS — E1140 Type 2 diabetes mellitus with diabetic neuropathy, unspecified: Secondary | ICD-10-CM | POA: Diagnosis not present

## 2017-01-29 DIAGNOSIS — J439 Emphysema, unspecified: Secondary | ICD-10-CM | POA: Diagnosis not present

## 2017-01-29 DIAGNOSIS — R1310 Dysphagia, unspecified: Secondary | ICD-10-CM | POA: Diagnosis not present

## 2017-01-29 DIAGNOSIS — I129 Hypertensive chronic kidney disease with stage 1 through stage 4 chronic kidney disease, or unspecified chronic kidney disease: Secondary | ICD-10-CM | POA: Diagnosis not present

## 2017-01-29 DIAGNOSIS — I499 Cardiac arrhythmia, unspecified: Secondary | ICD-10-CM | POA: Diagnosis not present

## 2017-01-29 DIAGNOSIS — Z794 Long term (current) use of insulin: Secondary | ICD-10-CM | POA: Diagnosis not present

## 2017-01-29 DIAGNOSIS — E1165 Type 2 diabetes mellitus with hyperglycemia: Secondary | ICD-10-CM | POA: Diagnosis not present

## 2017-01-29 DIAGNOSIS — I5033 Acute on chronic diastolic (congestive) heart failure: Secondary | ICD-10-CM | POA: Diagnosis not present

## 2017-01-29 DIAGNOSIS — R41841 Cognitive communication deficit: Secondary | ICD-10-CM | POA: Diagnosis not present

## 2017-01-29 DIAGNOSIS — R413 Other amnesia: Secondary | ICD-10-CM | POA: Diagnosis not present

## 2017-01-29 DIAGNOSIS — N189 Chronic kidney disease, unspecified: Secondary | ICD-10-CM | POA: Diagnosis not present

## 2017-01-29 DIAGNOSIS — E1136 Type 2 diabetes mellitus with diabetic cataract: Secondary | ICD-10-CM | POA: Diagnosis not present

## 2017-01-29 DIAGNOSIS — E119 Type 2 diabetes mellitus without complications: Secondary | ICD-10-CM | POA: Diagnosis not present

## 2017-01-29 DIAGNOSIS — G3184 Mild cognitive impairment, so stated: Secondary | ICD-10-CM | POA: Diagnosis not present

## 2017-01-29 DIAGNOSIS — I5031 Acute diastolic (congestive) heart failure: Secondary | ICD-10-CM | POA: Diagnosis not present

## 2017-01-29 DIAGNOSIS — E039 Hypothyroidism, unspecified: Secondary | ICD-10-CM | POA: Diagnosis not present

## 2017-01-29 DIAGNOSIS — Z5189 Encounter for other specified aftercare: Secondary | ICD-10-CM | POA: Diagnosis not present

## 2017-01-29 DIAGNOSIS — J9611 Chronic respiratory failure with hypoxia: Secondary | ICD-10-CM | POA: Diagnosis not present

## 2017-01-29 DIAGNOSIS — F419 Anxiety disorder, unspecified: Secondary | ICD-10-CM | POA: Diagnosis not present

## 2017-01-29 DIAGNOSIS — Z23 Encounter for immunization: Secondary | ICD-10-CM | POA: Diagnosis not present

## 2017-01-29 DIAGNOSIS — I10 Essential (primary) hypertension: Secondary | ICD-10-CM | POA: Diagnosis not present

## 2017-01-29 DIAGNOSIS — R1312 Dysphagia, oropharyngeal phase: Secondary | ICD-10-CM | POA: Diagnosis not present

## 2017-01-29 DIAGNOSIS — D649 Anemia, unspecified: Secondary | ICD-10-CM | POA: Diagnosis not present

## 2017-01-29 DIAGNOSIS — Z6841 Body Mass Index (BMI) 40.0 and over, adult: Secondary | ICD-10-CM | POA: Diagnosis not present

## 2017-02-01 ENCOUNTER — Other Ambulatory Visit: Payer: Self-pay

## 2017-02-01 NOTE — Patient Outreach (Signed)
[name] Care Management  02/01/2017  Andrea [name] 23, 1947 [id number]       Transition of Care Referral  Referral Date: 02/01/17 Referral Source: [facility] Discharge Report Date of Admission: unknown Diagnosis: unknown Date of Discharge: 01/29/17 Facility: [facility]    Outreach attempt # 1 to patient. No answer and unable to leave voicemail message.    Plan: RN CM will make outreach attempt to patient within three business days.   Enzo Montgomery, RN,BSN,CCM [place] Management Telephonic Care Management Coordinator Direct Phone: [phone] Toll Free: [phone] Fax: [phone]

## 2017-02-02 DIAGNOSIS — R1310 Dysphagia, unspecified: Secondary | ICD-10-CM | POA: Diagnosis not present

## 2017-02-02 DIAGNOSIS — I10 Essential (primary) hypertension: Secondary | ICD-10-CM | POA: Diagnosis not present

## 2017-02-02 DIAGNOSIS — M6281 Muscle weakness (generalized): Secondary | ICD-10-CM | POA: Diagnosis not present

## 2017-02-02 DIAGNOSIS — E785 Hyperlipidemia, unspecified: Secondary | ICD-10-CM | POA: Diagnosis not present

## 2017-02-02 DIAGNOSIS — E039 Hypothyroidism, unspecified: Secondary | ICD-10-CM | POA: Diagnosis not present

## 2017-02-02 DIAGNOSIS — E119 Type 2 diabetes mellitus without complications: Secondary | ICD-10-CM | POA: Diagnosis not present

## 2017-02-02 DIAGNOSIS — I4891 Unspecified atrial fibrillation: Secondary | ICD-10-CM | POA: Diagnosis not present

## 2017-02-02 DIAGNOSIS — I509 Heart failure, unspecified: Secondary | ICD-10-CM | POA: Diagnosis not present

## 2017-02-02 DIAGNOSIS — Z7901 Long term (current) use of anticoagulants: Secondary | ICD-10-CM | POA: Diagnosis not present

## 2017-02-04 ENCOUNTER — Other Ambulatory Visit
Admission: RE | Admit: 2017-02-04 | Discharge: 2017-02-04 | Disposition: A | Discharge status: Home / Self Care | Payer: Medicare HMO | Source: Ambulatory Visit | Attending: Family Medicine | Admitting: Family Medicine

## 2017-02-04 ENCOUNTER — Other Ambulatory Visit: Payer: Self-pay

## 2017-02-04 DIAGNOSIS — F419 Anxiety disorder, unspecified: Secondary | ICD-10-CM | POA: Diagnosis not present

## 2017-02-04 DIAGNOSIS — Z794 Long term (current) use of insulin: Secondary | ICD-10-CM | POA: Diagnosis not present

## 2017-02-04 DIAGNOSIS — I517 Cardiomegaly: Secondary | ICD-10-CM | POA: Diagnosis not present

## 2017-02-04 DIAGNOSIS — E1149 Type 2 diabetes mellitus with other diabetic neurological complication: Secondary | ICD-10-CM | POA: Diagnosis not present

## 2017-02-04 DIAGNOSIS — N189 Chronic kidney disease, unspecified: Secondary | ICD-10-CM | POA: Diagnosis not present

## 2017-02-04 DIAGNOSIS — I499 Cardiac arrhythmia, unspecified: Secondary | ICD-10-CM | POA: Diagnosis not present

## 2017-02-04 DIAGNOSIS — I313 Pericardial effusion (noninflammatory): Secondary | ICD-10-CM | POA: Diagnosis not present

## 2017-02-04 DIAGNOSIS — I309 Acute pericarditis, unspecified: Secondary | ICD-10-CM | POA: Diagnosis not present

## 2017-02-04 DIAGNOSIS — I509 Heart failure, unspecified: Secondary | ICD-10-CM | POA: Insufficient documentation

## 2017-02-04 DIAGNOSIS — M549 Dorsalgia, unspecified: Secondary | ICD-10-CM | POA: Diagnosis not present

## 2017-02-04 DIAGNOSIS — I480 Paroxysmal atrial fibrillation: Secondary | ICD-10-CM | POA: Diagnosis not present

## 2017-02-04 DIAGNOSIS — G3184 Mild cognitive impairment, so stated: Secondary | ICD-10-CM | POA: Diagnosis not present

## 2017-02-04 DIAGNOSIS — R5383 Other fatigue: Secondary | ICD-10-CM | POA: Diagnosis not present

## 2017-02-04 DIAGNOSIS — E875 Hyperkalemia: Secondary | ICD-10-CM | POA: Diagnosis not present

## 2017-02-04 DIAGNOSIS — I701 Atherosclerosis of renal artery: Secondary | ICD-10-CM | POA: Diagnosis not present

## 2017-02-04 DIAGNOSIS — D649 Anemia, unspecified: Secondary | ICD-10-CM | POA: Diagnosis not present

## 2017-02-04 LAB — BRAIN NATRIURETIC PEPTIDE: B NATRIURETIC PEPTIDE 5: 988 pg/mL — AB (ref 0.0–100.0)

## 2017-02-04 NOTE — Patient Outreach (Signed)
[facility]) Care Management  02/04/2017  Andrea [name] 08-17-1945 [id number]   Transition of Care Referral  Referral Date: 02/01/17 Referral Source: [facility] Discharge Report Date of Admission: unknown Diagnosis: unknown Date of Discharge: 01/29/17 Facility: [facility]    Outreach attempt #2 to patient and no answer. Noted in chart MD documented patient was currently at [facility] for rehab. RN CM will notify [facility] coordinator assigned to facility.     Plan: RN CM will close out referral at this time as patient currently in facility.     Enzo Montgomery, RN,BSN,CCM [place] Management Telephonic Care Management Coordinator Direct Phone: [phone] Toll Free: [phone] Fax: [phone]

## 2017-02-07 ENCOUNTER — Other Ambulatory Visit: Payer: Self-pay | Admitting: *Deleted

## 2017-02-07 DIAGNOSIS — R1310 Dysphagia, unspecified: Secondary | ICD-10-CM | POA: Diagnosis not present

## 2017-02-07 DIAGNOSIS — I10 Essential (primary) hypertension: Secondary | ICD-10-CM | POA: Diagnosis not present

## 2017-02-07 DIAGNOSIS — I509 Heart failure, unspecified: Secondary | ICD-10-CM | POA: Diagnosis not present

## 2017-02-07 DIAGNOSIS — E785 Hyperlipidemia, unspecified: Secondary | ICD-10-CM | POA: Diagnosis not present

## 2017-02-07 DIAGNOSIS — E119 Type 2 diabetes mellitus without complications: Secondary | ICD-10-CM | POA: Diagnosis not present

## 2017-02-07 DIAGNOSIS — M6281 Muscle weakness (generalized): Secondary | ICD-10-CM | POA: Diagnosis not present

## 2017-02-07 DIAGNOSIS — E039 Hypothyroidism, unspecified: Secondary | ICD-10-CM | POA: Diagnosis not present

## 2017-02-07 DIAGNOSIS — I4891 Unspecified atrial fibrillation: Secondary | ICD-10-CM | POA: Diagnosis not present

## 2017-02-07 DIAGNOSIS — Z794 Long term (current) use of insulin: Secondary | ICD-10-CM | POA: Diagnosis not present

## 2017-02-07 NOTE — Patient Outreach (Signed)
[facility]) Care Management  02/07/2017  Maeli Spacek Dishman [birth year] [id number]  Met with Dimple Nanas, SW at facility. He states that patient will go home with spouse. She was independent before admission.  She does have some confusion per chart. She has COPD and is on 3lpm O2  Met with patient at bedside of facility, spouse was not in the facility.  RNCM discussed [facility] program briefly, left brochure with RNCM contact to discuss with patient spouse  Plan to follow up closer to discharge. [name]. Laymond Purser, RN, BSN, [place] [phone]) Business Cell  [phone]) Toll Free Office

## 2017-02-15 DIAGNOSIS — G3184 Mild cognitive impairment, so stated: Secondary | ICD-10-CM | POA: Diagnosis not present

## 2017-02-15 DIAGNOSIS — E1140 Type 2 diabetes mellitus with diabetic neuropathy, unspecified: Secondary | ICD-10-CM | POA: Diagnosis not present

## 2017-02-15 DIAGNOSIS — I10 Essential (primary) hypertension: Secondary | ICD-10-CM | POA: Diagnosis not present

## 2017-02-15 DIAGNOSIS — Z23 Encounter for immunization: Secondary | ICD-10-CM | POA: Diagnosis not present

## 2017-02-15 DIAGNOSIS — N183 Chronic kidney disease, stage 3 (moderate): Secondary | ICD-10-CM | POA: Diagnosis not present

## 2017-02-15 DIAGNOSIS — D649 Anemia, unspecified: Secondary | ICD-10-CM | POA: Diagnosis not present

## 2017-02-15 DIAGNOSIS — I5033 Acute on chronic diastolic (congestive) heart failure: Secondary | ICD-10-CM | POA: Diagnosis not present

## 2017-02-19 DIAGNOSIS — I10 Essential (primary) hypertension: Secondary | ICD-10-CM | POA: Diagnosis not present

## 2017-02-19 DIAGNOSIS — Z7901 Long term (current) use of anticoagulants: Secondary | ICD-10-CM | POA: Diagnosis not present

## 2017-02-19 DIAGNOSIS — E785 Hyperlipidemia, unspecified: Secondary | ICD-10-CM | POA: Diagnosis not present

## 2017-02-19 DIAGNOSIS — M6281 Muscle weakness (generalized): Secondary | ICD-10-CM | POA: Diagnosis not present

## 2017-02-19 DIAGNOSIS — E119 Type 2 diabetes mellitus without complications: Secondary | ICD-10-CM | POA: Diagnosis not present

## 2017-02-19 DIAGNOSIS — Z794 Long term (current) use of insulin: Secondary | ICD-10-CM | POA: Diagnosis not present

## 2017-02-19 DIAGNOSIS — I509 Heart failure, unspecified: Secondary | ICD-10-CM | POA: Diagnosis not present

## 2017-02-19 DIAGNOSIS — R413 Other amnesia: Secondary | ICD-10-CM | POA: Diagnosis not present

## 2017-02-19 DIAGNOSIS — I4891 Unspecified atrial fibrillation: Secondary | ICD-10-CM | POA: Diagnosis not present

## 2017-02-21 DIAGNOSIS — I129 Hypertensive chronic kidney disease with stage 1 through stage 4 chronic kidney disease, or unspecified chronic kidney disease: Secondary | ICD-10-CM | POA: Diagnosis not present

## 2017-02-21 DIAGNOSIS — R9431 Abnormal electrocardiogram [ECG] [EKG]: Secondary | ICD-10-CM | POA: Diagnosis not present

## 2017-02-21 DIAGNOSIS — E1142 Type 2 diabetes mellitus with diabetic polyneuropathy: Secondary | ICD-10-CM | POA: Diagnosis not present

## 2017-02-21 DIAGNOSIS — N184 Chronic kidney disease, stage 4 (severe): Secondary | ICD-10-CM | POA: Diagnosis not present

## 2017-02-21 DIAGNOSIS — N179 Acute kidney failure, unspecified: Secondary | ICD-10-CM | POA: Diagnosis not present

## 2017-02-21 DIAGNOSIS — I272 Pulmonary hypertension, unspecified: Secondary | ICD-10-CM | POA: Diagnosis not present

## 2017-02-21 DIAGNOSIS — J90 Pleural effusion, not elsewhere classified: Secondary | ICD-10-CM | POA: Diagnosis not present

## 2017-02-21 DIAGNOSIS — I5033 Acute on chronic diastolic (congestive) heart failure: Secondary | ICD-10-CM | POA: Diagnosis not present

## 2017-02-21 DIAGNOSIS — I517 Cardiomegaly: Secondary | ICD-10-CM | POA: Diagnosis not present

## 2017-02-21 DIAGNOSIS — G3184 Mild cognitive impairment, so stated: Secondary | ICD-10-CM | POA: Diagnosis not present

## 2017-02-21 DIAGNOSIS — I130 Hypertensive heart and chronic kidney disease with heart failure and stage 1 through stage 4 chronic kidney disease, or unspecified chronic kidney disease: Secondary | ICD-10-CM | POA: Diagnosis not present

## 2017-02-21 DIAGNOSIS — I519 Heart disease, unspecified: Secondary | ICD-10-CM | POA: Diagnosis not present

## 2017-02-21 DIAGNOSIS — I441 Atrioventricular block, second degree: Secondary | ICD-10-CM | POA: Diagnosis not present

## 2017-02-21 DIAGNOSIS — I480 Paroxysmal atrial fibrillation: Secondary | ICD-10-CM | POA: Diagnosis not present

## 2017-02-21 DIAGNOSIS — E1122 Type 2 diabetes mellitus with diabetic chronic kidney disease: Secondary | ICD-10-CM | POA: Diagnosis not present

## 2017-02-21 DIAGNOSIS — Z794 Long term (current) use of insulin: Secondary | ICD-10-CM | POA: Diagnosis not present

## 2017-02-21 DIAGNOSIS — I361 Nonrheumatic tricuspid (valve) insufficiency: Secondary | ICD-10-CM | POA: Diagnosis not present

## 2017-02-21 DIAGNOSIS — J449 Chronic obstructive pulmonary disease, unspecified: Secondary | ICD-10-CM | POA: Diagnosis not present

## 2017-02-21 DIAGNOSIS — I2510 Atherosclerotic heart disease of native coronary artery without angina pectoris: Secondary | ICD-10-CM | POA: Diagnosis not present

## 2017-02-21 DIAGNOSIS — I340 Nonrheumatic mitral (valve) insufficiency: Secondary | ICD-10-CM | POA: Diagnosis not present

## 2017-02-21 DIAGNOSIS — Z6841 Body Mass Index (BMI) 40.0 and over, adult: Secondary | ICD-10-CM | POA: Diagnosis not present

## 2017-02-21 DIAGNOSIS — J439 Emphysema, unspecified: Secondary | ICD-10-CM | POA: Diagnosis not present

## 2017-02-21 DIAGNOSIS — I4891 Unspecified atrial fibrillation: Secondary | ICD-10-CM | POA: Diagnosis not present

## 2017-02-21 DIAGNOSIS — J9611 Chronic respiratory failure with hypoxia: Secondary | ICD-10-CM | POA: Diagnosis not present

## 2017-02-21 DIAGNOSIS — E1165 Type 2 diabetes mellitus with hyperglycemia: Secondary | ICD-10-CM | POA: Diagnosis not present

## 2017-02-21 DIAGNOSIS — E1136 Type 2 diabetes mellitus with diabetic cataract: Secondary | ICD-10-CM | POA: Diagnosis not present

## 2017-02-21 DIAGNOSIS — I700 Atherosclerosis of aorta: Secondary | ICD-10-CM | POA: Diagnosis not present

## 2017-02-22 ENCOUNTER — Other Ambulatory Visit: Payer: Self-pay | Admitting: *Deleted

## 2017-02-22 NOTE — Patient Outreach (Signed)
[facility]) Care Management  02/22/2017  [name] February 11, 1946 [id number]   Met with Andrea [name] at facility.  He states patient went to MD appointment yesterday and they sent her over to the hospital due to fluid overload. He anticipates patient will go home from hospital.  Plan to sign off as patient discharged from facility, and has admitted into hospital. Unable to refer to [facility] hospital liaison as No [facility] hospital liaison follows [facility] hospital system at this time.   [name]. Laymond Purser, RN, BSN, [place] [phone]) Business Cell  [phone]) Toll Free Office

## 2017-02-26 DIAGNOSIS — I5033 Acute on chronic diastolic (congestive) heart failure: Secondary | ICD-10-CM | POA: Diagnosis not present

## 2017-02-26 DIAGNOSIS — I480 Paroxysmal atrial fibrillation: Secondary | ICD-10-CM | POA: Diagnosis not present

## 2017-02-26 DIAGNOSIS — J449 Chronic obstructive pulmonary disease, unspecified: Secondary | ICD-10-CM | POA: Diagnosis not present

## 2017-02-26 DIAGNOSIS — E1122 Type 2 diabetes mellitus with diabetic chronic kidney disease: Secondary | ICD-10-CM | POA: Diagnosis not present

## 2017-02-26 DIAGNOSIS — E1142 Type 2 diabetes mellitus with diabetic polyneuropathy: Secondary | ICD-10-CM | POA: Diagnosis not present

## 2017-02-26 DIAGNOSIS — E1165 Type 2 diabetes mellitus with hyperglycemia: Secondary | ICD-10-CM | POA: Diagnosis not present

## 2017-02-26 DIAGNOSIS — I441 Atrioventricular block, second degree: Secondary | ICD-10-CM | POA: Diagnosis not present

## 2017-02-26 DIAGNOSIS — I130 Hypertensive heart and chronic kidney disease with heart failure and stage 1 through stage 4 chronic kidney disease, or unspecified chronic kidney disease: Secondary | ICD-10-CM | POA: Diagnosis not present

## 2017-02-26 DIAGNOSIS — N184 Chronic kidney disease, stage 4 (severe): Secondary | ICD-10-CM | POA: Diagnosis not present

## 2017-02-27 DIAGNOSIS — J449 Chronic obstructive pulmonary disease, unspecified: Secondary | ICD-10-CM | POA: Diagnosis not present

## 2017-02-27 DIAGNOSIS — E1142 Type 2 diabetes mellitus with diabetic polyneuropathy: Secondary | ICD-10-CM | POA: Diagnosis not present

## 2017-02-27 DIAGNOSIS — N184 Chronic kidney disease, stage 4 (severe): Secondary | ICD-10-CM | POA: Diagnosis not present

## 2017-02-27 DIAGNOSIS — I441 Atrioventricular block, second degree: Secondary | ICD-10-CM | POA: Diagnosis not present

## 2017-02-27 DIAGNOSIS — E1122 Type 2 diabetes mellitus with diabetic chronic kidney disease: Secondary | ICD-10-CM | POA: Diagnosis not present

## 2017-02-27 DIAGNOSIS — I480 Paroxysmal atrial fibrillation: Secondary | ICD-10-CM | POA: Diagnosis not present

## 2017-02-27 DIAGNOSIS — I130 Hypertensive heart and chronic kidney disease with heart failure and stage 1 through stage 4 chronic kidney disease, or unspecified chronic kidney disease: Secondary | ICD-10-CM | POA: Diagnosis not present

## 2017-02-27 DIAGNOSIS — I5033 Acute on chronic diastolic (congestive) heart failure: Secondary | ICD-10-CM | POA: Diagnosis not present

## 2017-02-27 DIAGNOSIS — E1165 Type 2 diabetes mellitus with hyperglycemia: Secondary | ICD-10-CM | POA: Diagnosis not present

## 2017-02-28 DIAGNOSIS — I130 Hypertensive heart and chronic kidney disease with heart failure and stage 1 through stage 4 chronic kidney disease, or unspecified chronic kidney disease: Secondary | ICD-10-CM | POA: Diagnosis not present

## 2017-02-28 DIAGNOSIS — E1122 Type 2 diabetes mellitus with diabetic chronic kidney disease: Secondary | ICD-10-CM | POA: Diagnosis not present

## 2017-02-28 DIAGNOSIS — I5033 Acute on chronic diastolic (congestive) heart failure: Secondary | ICD-10-CM | POA: Diagnosis not present

## 2017-02-28 DIAGNOSIS — I480 Paroxysmal atrial fibrillation: Secondary | ICD-10-CM | POA: Diagnosis not present

## 2017-02-28 DIAGNOSIS — N184 Chronic kidney disease, stage 4 (severe): Secondary | ICD-10-CM | POA: Diagnosis not present

## 2017-02-28 DIAGNOSIS — E1142 Type 2 diabetes mellitus with diabetic polyneuropathy: Secondary | ICD-10-CM | POA: Diagnosis not present

## 2017-02-28 DIAGNOSIS — E1165 Type 2 diabetes mellitus with hyperglycemia: Secondary | ICD-10-CM | POA: Diagnosis not present

## 2017-02-28 DIAGNOSIS — J449 Chronic obstructive pulmonary disease, unspecified: Secondary | ICD-10-CM | POA: Diagnosis not present

## 2017-02-28 DIAGNOSIS — I441 Atrioventricular block, second degree: Secondary | ICD-10-CM | POA: Diagnosis not present

## 2017-03-01 DIAGNOSIS — E1122 Type 2 diabetes mellitus with diabetic chronic kidney disease: Secondary | ICD-10-CM | POA: Diagnosis not present

## 2017-03-01 DIAGNOSIS — E039 Hypothyroidism, unspecified: Secondary | ICD-10-CM | POA: Diagnosis not present

## 2017-03-01 DIAGNOSIS — E1140 Type 2 diabetes mellitus with diabetic neuropathy, unspecified: Secondary | ICD-10-CM | POA: Diagnosis not present

## 2017-03-01 DIAGNOSIS — I10 Essential (primary) hypertension: Secondary | ICD-10-CM | POA: Diagnosis not present

## 2017-03-01 DIAGNOSIS — R54 Age-related physical debility: Secondary | ICD-10-CM | POA: Diagnosis not present

## 2017-03-01 DIAGNOSIS — N183 Chronic kidney disease, stage 3 (moderate): Secondary | ICD-10-CM | POA: Diagnosis not present

## 2017-03-01 DIAGNOSIS — I130 Hypertensive heart and chronic kidney disease with heart failure and stage 1 through stage 4 chronic kidney disease, or unspecified chronic kidney disease: Secondary | ICD-10-CM | POA: Diagnosis not present

## 2017-03-01 DIAGNOSIS — I5033 Acute on chronic diastolic (congestive) heart failure: Secondary | ICD-10-CM | POA: Diagnosis not present

## 2017-03-01 DIAGNOSIS — J439 Emphysema, unspecified: Secondary | ICD-10-CM | POA: Diagnosis not present

## 2017-03-01 DIAGNOSIS — F0390 Unspecified dementia without behavioral disturbance: Secondary | ICD-10-CM | POA: Diagnosis not present

## 2017-03-04 DIAGNOSIS — E1165 Type 2 diabetes mellitus with hyperglycemia: Secondary | ICD-10-CM | POA: Diagnosis not present

## 2017-03-04 DIAGNOSIS — I480 Paroxysmal atrial fibrillation: Secondary | ICD-10-CM | POA: Diagnosis not present

## 2017-03-04 DIAGNOSIS — I441 Atrioventricular block, second degree: Secondary | ICD-10-CM | POA: Diagnosis not present

## 2017-03-04 DIAGNOSIS — E1122 Type 2 diabetes mellitus with diabetic chronic kidney disease: Secondary | ICD-10-CM | POA: Diagnosis not present

## 2017-03-04 DIAGNOSIS — I130 Hypertensive heart and chronic kidney disease with heart failure and stage 1 through stage 4 chronic kidney disease, or unspecified chronic kidney disease: Secondary | ICD-10-CM | POA: Diagnosis not present

## 2017-03-04 DIAGNOSIS — I5033 Acute on chronic diastolic (congestive) heart failure: Secondary | ICD-10-CM | POA: Diagnosis not present

## 2017-03-04 DIAGNOSIS — J449 Chronic obstructive pulmonary disease, unspecified: Secondary | ICD-10-CM | POA: Diagnosis not present

## 2017-03-04 DIAGNOSIS — E1142 Type 2 diabetes mellitus with diabetic polyneuropathy: Secondary | ICD-10-CM | POA: Diagnosis not present

## 2017-03-04 DIAGNOSIS — N184 Chronic kidney disease, stage 4 (severe): Secondary | ICD-10-CM | POA: Diagnosis not present

## 2017-03-05 DIAGNOSIS — N184 Chronic kidney disease, stage 4 (severe): Secondary | ICD-10-CM | POA: Diagnosis not present

## 2017-03-05 DIAGNOSIS — E1142 Type 2 diabetes mellitus with diabetic polyneuropathy: Secondary | ICD-10-CM | POA: Diagnosis not present

## 2017-03-05 DIAGNOSIS — I5033 Acute on chronic diastolic (congestive) heart failure: Secondary | ICD-10-CM | POA: Diagnosis not present

## 2017-03-05 DIAGNOSIS — E1122 Type 2 diabetes mellitus with diabetic chronic kidney disease: Secondary | ICD-10-CM | POA: Diagnosis not present

## 2017-03-05 DIAGNOSIS — I130 Hypertensive heart and chronic kidney disease with heart failure and stage 1 through stage 4 chronic kidney disease, or unspecified chronic kidney disease: Secondary | ICD-10-CM | POA: Diagnosis not present

## 2017-03-05 DIAGNOSIS — J449 Chronic obstructive pulmonary disease, unspecified: Secondary | ICD-10-CM | POA: Diagnosis not present

## 2017-03-05 DIAGNOSIS — I480 Paroxysmal atrial fibrillation: Secondary | ICD-10-CM | POA: Diagnosis not present

## 2017-03-05 DIAGNOSIS — E1165 Type 2 diabetes mellitus with hyperglycemia: Secondary | ICD-10-CM | POA: Diagnosis not present

## 2017-03-05 DIAGNOSIS — I441 Atrioventricular block, second degree: Secondary | ICD-10-CM | POA: Diagnosis not present

## 2017-03-06 DIAGNOSIS — I480 Paroxysmal atrial fibrillation: Secondary | ICD-10-CM | POA: Diagnosis not present

## 2017-03-06 DIAGNOSIS — E1142 Type 2 diabetes mellitus with diabetic polyneuropathy: Secondary | ICD-10-CM | POA: Diagnosis not present

## 2017-03-06 DIAGNOSIS — E1165 Type 2 diabetes mellitus with hyperglycemia: Secondary | ICD-10-CM | POA: Diagnosis not present

## 2017-03-06 DIAGNOSIS — I441 Atrioventricular block, second degree: Secondary | ICD-10-CM | POA: Diagnosis not present

## 2017-03-06 DIAGNOSIS — N184 Chronic kidney disease, stage 4 (severe): Secondary | ICD-10-CM | POA: Diagnosis not present

## 2017-03-06 DIAGNOSIS — I130 Hypertensive heart and chronic kidney disease with heart failure and stage 1 through stage 4 chronic kidney disease, or unspecified chronic kidney disease: Secondary | ICD-10-CM | POA: Diagnosis not present

## 2017-03-06 DIAGNOSIS — E1122 Type 2 diabetes mellitus with diabetic chronic kidney disease: Secondary | ICD-10-CM | POA: Diagnosis not present

## 2017-03-06 DIAGNOSIS — I5033 Acute on chronic diastolic (congestive) heart failure: Secondary | ICD-10-CM | POA: Diagnosis not present

## 2017-03-06 DIAGNOSIS — J449 Chronic obstructive pulmonary disease, unspecified: Secondary | ICD-10-CM | POA: Diagnosis not present

## 2017-03-07 ENCOUNTER — Observation Stay
Admission: EM | Admit: 2017-03-07 | Discharge: 2017-03-09 | Disposition: A | Discharge status: Home / Self Care | Payer: Medicare HMO | Attending: Internal Medicine | Admitting: Internal Medicine

## 2017-03-07 ENCOUNTER — Encounter: Payer: Self-pay | Admitting: Emergency Medicine

## 2017-03-07 DIAGNOSIS — Z9981 Dependence on supplemental oxygen: Secondary | ICD-10-CM | POA: Diagnosis not present

## 2017-03-07 DIAGNOSIS — E785 Hyperlipidemia, unspecified: Secondary | ICD-10-CM | POA: Diagnosis not present

## 2017-03-07 DIAGNOSIS — I110 Hypertensive heart disease with heart failure: Secondary | ICD-10-CM | POA: Insufficient documentation

## 2017-03-07 DIAGNOSIS — E039 Hypothyroidism, unspecified: Secondary | ICD-10-CM | POA: Insufficient documentation

## 2017-03-07 DIAGNOSIS — F0390 Unspecified dementia without behavioral disturbance: Secondary | ICD-10-CM | POA: Diagnosis not present

## 2017-03-07 DIAGNOSIS — Z79890 Hormone replacement therapy (postmenopausal): Secondary | ICD-10-CM | POA: Insufficient documentation

## 2017-03-07 DIAGNOSIS — Z794 Long term (current) use of insulin: Secondary | ICD-10-CM | POA: Insufficient documentation

## 2017-03-07 DIAGNOSIS — E669 Obesity, unspecified: Secondary | ICD-10-CM | POA: Diagnosis not present

## 2017-03-07 DIAGNOSIS — T50901A Poisoning by unspecified drugs, medicaments and biological substances, accidental (unintentional), initial encounter: Secondary | ICD-10-CM

## 2017-03-07 DIAGNOSIS — Z79899 Other long term (current) drug therapy: Secondary | ICD-10-CM | POA: Diagnosis not present

## 2017-03-07 DIAGNOSIS — Z6835 Body mass index (BMI) 35.0-35.9, adult: Secondary | ICD-10-CM | POA: Diagnosis not present

## 2017-03-07 DIAGNOSIS — J90 Pleural effusion, not elsewhere classified: Secondary | ICD-10-CM | POA: Diagnosis not present

## 2017-03-07 DIAGNOSIS — Z87891 Personal history of nicotine dependence: Secondary | ICD-10-CM | POA: Insufficient documentation

## 2017-03-07 DIAGNOSIS — Z7901 Long term (current) use of anticoagulants: Secondary | ICD-10-CM | POA: Insufficient documentation

## 2017-03-07 DIAGNOSIS — I509 Heart failure, unspecified: Secondary | ICD-10-CM | POA: Diagnosis not present

## 2017-03-07 DIAGNOSIS — I130 Hypertensive heart and chronic kidney disease with heart failure and stage 1 through stage 4 chronic kidney disease, or unspecified chronic kidney disease: Secondary | ICD-10-CM | POA: Diagnosis not present

## 2017-03-07 DIAGNOSIS — E1165 Type 2 diabetes mellitus with hyperglycemia: Secondary | ICD-10-CM | POA: Diagnosis not present

## 2017-03-07 DIAGNOSIS — I441 Atrioventricular block, second degree: Secondary | ICD-10-CM | POA: Diagnosis not present

## 2017-03-07 DIAGNOSIS — I5033 Acute on chronic diastolic (congestive) heart failure: Secondary | ICD-10-CM | POA: Diagnosis not present

## 2017-03-07 DIAGNOSIS — E1122 Type 2 diabetes mellitus with diabetic chronic kidney disease: Secondary | ICD-10-CM | POA: Diagnosis not present

## 2017-03-07 DIAGNOSIS — I480 Paroxysmal atrial fibrillation: Secondary | ICD-10-CM | POA: Diagnosis not present

## 2017-03-07 DIAGNOSIS — J449 Chronic obstructive pulmonary disease, unspecified: Secondary | ICD-10-CM | POA: Diagnosis not present

## 2017-03-07 DIAGNOSIS — N184 Chronic kidney disease, stage 4 (severe): Secondary | ICD-10-CM | POA: Diagnosis not present

## 2017-03-07 DIAGNOSIS — I4891 Unspecified atrial fibrillation: Secondary | ICD-10-CM | POA: Diagnosis not present

## 2017-03-07 DIAGNOSIS — T45511A Poisoning by anticoagulants, accidental (unintentional), initial encounter: Secondary | ICD-10-CM | POA: Diagnosis not present

## 2017-03-07 DIAGNOSIS — Z7982 Long term (current) use of aspirin: Secondary | ICD-10-CM | POA: Diagnosis not present

## 2017-03-07 DIAGNOSIS — Z7952 Long term (current) use of systemic steroids: Secondary | ICD-10-CM | POA: Diagnosis not present

## 2017-03-07 DIAGNOSIS — E1142 Type 2 diabetes mellitus with diabetic polyneuropathy: Secondary | ICD-10-CM | POA: Diagnosis not present

## 2017-03-07 DIAGNOSIS — Z7902 Long term (current) use of antithrombotics/antiplatelets: Secondary | ICD-10-CM | POA: Diagnosis not present

## 2017-03-07 NOTE — ED Triage Notes (Signed)
Pt reports she accidentally took husbands medication (10 Xarelto) at approximately 2230. Pt advised by poison control to come to ED for further evaluation. Pt recent hospitalization for CHF and fluid overload. Pt only complaint is occasional abdominal pain and nausea. Pt is A&O x4.

## 2017-03-07 NOTE — ED Notes (Addendum)
Poison control contacted, spoke with Danielle. Recommendations: due to drug effect taking time to show symptoms, keeping pt on observation for 24 hours and monitoring Vitamin K, CBC (6 hours), liver enzymes, PT and any physical symptoms of bleeding.

## 2017-03-08 ENCOUNTER — Emergency Department: Payer: Medicare HMO

## 2017-03-08 DIAGNOSIS — I10 Essential (primary) hypertension: Secondary | ICD-10-CM | POA: Diagnosis not present

## 2017-03-08 DIAGNOSIS — I4891 Unspecified atrial fibrillation: Secondary | ICD-10-CM | POA: Diagnosis not present

## 2017-03-08 DIAGNOSIS — T50901A Poisoning by unspecified drugs, medicaments and biological substances, accidental (unintentional), initial encounter: Secondary | ICD-10-CM

## 2017-03-08 DIAGNOSIS — J90 Pleural effusion, not elsewhere classified: Secondary | ICD-10-CM | POA: Diagnosis not present

## 2017-03-08 DIAGNOSIS — I5032 Chronic diastolic (congestive) heart failure: Secondary | ICD-10-CM | POA: Diagnosis not present

## 2017-03-08 LAB — BASIC METABOLIC PANEL
Anion gap: 10 (ref 5–15)
BUN: 30 mg/dL — AB (ref 6–20)
CO2: 30 mmol/L (ref 22–32)
CREATININE: 1.81 mg/dL — AB (ref 0.44–1.00)
Calcium: 8.6 mg/dL — ABNORMAL LOW (ref 8.9–10.3)
Chloride: 102 mmol/L (ref 101–111)
GFR, EST AFRICAN AMERICAN: 31 mL/min — AB (ref >60)
GFR, EST NON AFRICAN AMERICAN: 27 mL/min — AB (ref >60)
Glucose, Bld: 95 mg/dL (ref 65–99)
POTASSIUM: 3.3 mmol/L — AB (ref 3.5–5.1)
SODIUM: 142 mmol/L (ref 135–145)

## 2017-03-08 LAB — CBC WITH DIFFERENTIAL/PLATELET
BASOS ABS: 0.1 10*3/uL (ref 0–0.1)
Basophils Relative: 2 %
EOS ABS: 0.3 10*3/uL (ref 0–0.7)
EOS PCT: 4 %
HCT: 34.3 % — ABNORMAL LOW (ref 35.0–47.0)
HEMOGLOBIN: 11 g/dL — AB (ref 12.0–16.0)
LYMPHS ABS: 1.1 10*3/uL (ref 1.0–3.6)
Lymphocytes Relative: 18 %
MCH: 26.7 pg (ref 26.0–34.0)
MCHC: 32.1 g/dL (ref 32.0–36.0)
MCV: 83.2 fL (ref 80.0–100.0)
Monocytes Absolute: 0.3 10*3/uL (ref 0.2–0.9)
Monocytes Relative: 4 %
NEUTROS PCT: 72 %
Neutro Abs: 4.7 10*3/uL (ref 1.4–6.5)
Platelets: 178 10*3/uL (ref 150–440)
RBC: 4.12 MIL/uL (ref 3.80–5.20)
RDW: 15.6 % — AB (ref 11.5–14.5)
WBC: 6.5 10*3/uL (ref 3.6–11.0)

## 2017-03-08 LAB — COMPREHENSIVE METABOLIC PANEL
ALBUMIN: 3.6 g/dL (ref 3.5–5.0)
ALK PHOS: 95 U/L (ref 38–126)
ALT: 13 U/L — ABNORMAL LOW (ref 14–54)
ANION GAP: 9 (ref 5–15)
AST: 23 U/L (ref 15–41)
BILIRUBIN TOTAL: 0.6 mg/dL (ref 0.3–1.2)
BUN: 33 mg/dL — ABNORMAL HIGH (ref 6–20)
CALCIUM: 8.8 mg/dL — AB (ref 8.9–10.3)
CO2: 32 mmol/L (ref 22–32)
Chloride: 98 mmol/L — ABNORMAL LOW (ref 101–111)
Creatinine, Ser: 1.89 mg/dL — ABNORMAL HIGH (ref 0.44–1.00)
GFR calc non Af Amer: 26 mL/min — ABNORMAL LOW (ref >60)
GFR, EST AFRICAN AMERICAN: 30 mL/min — AB (ref >60)
Glucose, Bld: 171 mg/dL — ABNORMAL HIGH (ref 65–99)
POTASSIUM: 3.4 mmol/L — AB (ref 3.5–5.1)
SODIUM: 139 mmol/L (ref 135–145)
TOTAL PROTEIN: 7 g/dL (ref 6.5–8.1)

## 2017-03-08 LAB — TROPONIN I

## 2017-03-08 LAB — CBC
HEMATOCRIT: 33.1 % — AB (ref 35.0–47.0)
HEMOGLOBIN: 10.7 g/dL — AB (ref 12.0–16.0)
MCH: 26.4 pg (ref 26.0–34.0)
MCHC: 32.1 g/dL (ref 32.0–36.0)
MCV: 82.1 fL (ref 80.0–100.0)
PLATELETS: 158 10*3/uL (ref 150–440)
RBC: 4.04 MIL/uL (ref 3.80–5.20)
RDW: 15.5 % — ABNORMAL HIGH (ref 11.5–14.5)
WBC: 6.3 10*3/uL (ref 3.6–11.0)

## 2017-03-08 LAB — PROTIME-INR
INR: 4.99 — AB
Prothrombin Time: 46 seconds — ABNORMAL HIGH (ref 11.4–15.2)

## 2017-03-08 LAB — GLUCOSE, CAPILLARY
GLUCOSE-CAPILLARY: 65 mg/dL (ref 65–99)
GLUCOSE-CAPILLARY: 183 mg/dL — AB (ref 65–99)
Glucose-Capillary: 220 mg/dL — ABNORMAL HIGH (ref 65–99)

## 2017-03-08 LAB — MAGNESIUM: Magnesium: 1.7 mg/dL (ref 1.7–2.4)

## 2017-03-08 LAB — FIBRIN DERIVATIVES D-DIMER (ARMC ONLY): Fibrin derivatives D-dimer (ARMC): 838.15 ng/mL (FEU) — ABNORMAL HIGH (ref 0.00–499.00)

## 2017-03-08 MED ORDER — GABAPENTIN 300 MG PO CAPS
600.0000 mg | ORAL_CAPSULE | Freq: Two times a day (BID) | ORAL | Status: DC
  Administered 2017-03-08 – 2017-03-09 (×3): 600 mg via ORAL
  Filled 2017-03-08 (×3): qty 2

## 2017-03-08 MED ORDER — CARVEDILOL 3.125 MG PO TABS
12.5000 mg | ORAL_TABLET | Freq: Two times a day (BID) | ORAL | Status: DC
  Administered 2017-03-08 – 2017-03-09 (×3): 12.5 mg via ORAL
  Filled 2017-03-08 (×3): qty 4

## 2017-03-08 MED ORDER — ATORVASTATIN CALCIUM 20 MG PO TABS
20.0000 mg | ORAL_TABLET | Freq: Every day | ORAL | Status: DC
  Administered 2017-03-08: 20 mg via ORAL
  Filled 2017-03-08: qty 1

## 2017-03-08 MED ORDER — LEVOTHYROXINE SODIUM 50 MCG PO TABS
175.0000 ug | ORAL_TABLET | Freq: Every day | ORAL | Status: DC
  Administered 2017-03-08 – 2017-03-09 (×2): 175 ug via ORAL
  Filled 2017-03-08 (×2): qty 1

## 2017-03-08 MED ORDER — PAROXETINE HCL 20 MG PO TABS
40.0000 mg | ORAL_TABLET | Freq: Every day | ORAL | Status: DC
  Administered 2017-03-08 – 2017-03-09 (×2): 40 mg via ORAL
  Filled 2017-03-08 (×2): qty 2

## 2017-03-08 MED ORDER — ORAL CARE MOUTH RINSE
15.0000 mL | Freq: Two times a day (BID) | OROMUCOSAL | Status: DC
  Administered 2017-03-08: 15 mL via OROMUCOSAL

## 2017-03-08 MED ORDER — HYDRALAZINE HCL 50 MG PO TABS
25.0000 mg | ORAL_TABLET | Freq: Three times a day (TID) | ORAL | Status: DC
  Administered 2017-03-08 – 2017-03-09 (×4): 25 mg via ORAL
  Filled 2017-03-08 (×4): qty 1

## 2017-03-08 MED ORDER — ASPIRIN EC 81 MG PO TBEC
81.0000 mg | DELAYED_RELEASE_TABLET | Freq: Every day | ORAL | Status: DC
  Administered 2017-03-08 – 2017-03-09 (×2): 81 mg via ORAL
  Filled 2017-03-08 (×2): qty 1

## 2017-03-08 MED ORDER — INSULIN ASPART 100 UNIT/ML ~~LOC~~ SOLN
0.0000 [IU] | Freq: Three times a day (TID) | SUBCUTANEOUS | Status: DC
  Administered 2017-03-08: 12:00:00 2 [IU] via SUBCUTANEOUS
  Administered 2017-03-08: 3 [IU] via SUBCUTANEOUS
  Filled 2017-03-08 (×2): qty 1

## 2017-03-08 MED ORDER — POTASSIUM CHLORIDE CRYS ER 20 MEQ PO TBCR
40.0000 meq | EXTENDED_RELEASE_TABLET | ORAL | Status: AC
  Administered 2017-03-08 (×2): 40 meq via ORAL
  Filled 2017-03-08 (×2): qty 2

## 2017-03-08 MED ORDER — POLYETHYLENE GLYCOL 3350 17 G PO PACK
17.0000 g | PACK | Freq: Every day | ORAL | Status: DC
  Administered 2017-03-08 – 2017-03-09 (×2): 17 g via ORAL
  Filled 2017-03-08 (×2): qty 1

## 2017-03-08 MED ORDER — INSULIN GLARGINE 100 UNIT/ML ~~LOC~~ SOLN
30.0000 [IU] | Freq: Every day | SUBCUTANEOUS | Status: DC
  Administered 2017-03-08 – 2017-03-09 (×2): 30 [IU] via SUBCUTANEOUS
  Filled 2017-03-08 (×2): qty 0.3

## 2017-03-08 MED ORDER — LOSARTAN POTASSIUM 50 MG PO TABS
50.0000 mg | ORAL_TABLET | Freq: Every day | ORAL | Status: DC
  Administered 2017-03-08 – 2017-03-09 (×2): 50 mg via ORAL
  Filled 2017-03-08 (×2): qty 1

## 2017-03-08 MED ORDER — CLONIDINE HCL 0.1 MG PO TABS
0.3000 mg | ORAL_TABLET | Freq: Two times a day (BID) | ORAL | Status: DC
  Administered 2017-03-08 – 2017-03-09 (×3): 0.3 mg via ORAL
  Filled 2017-03-08 (×3): qty 3

## 2017-03-08 MED ORDER — DOCUSATE SODIUM 100 MG PO CAPS: 100.0000 mg | ORAL_CAPSULE | Freq: Two times a day (BID) | ORAL | Status: DC | PRN

## 2017-03-08 MED ORDER — ACETAMINOPHEN 325 MG PO TABS: 650.0000 mg | ORAL_TABLET | Freq: Three times a day (TID) | ORAL | Status: DC | PRN

## 2017-03-08 NOTE — Progress Notes (Signed)
Dr Posey Pronto made aware of INR critical 4.99, acknowledged, new order for PT/INR tomorrow morning

## 2017-03-08 NOTE — ED Notes (Addendum)
Patient states she is not in any pain and in NAD, patient is on 3L [place] at home.

## 2017-03-08 NOTE — Progress Notes (Signed)
Pharmacy Electrolyte Monitoring   71 y/o F admitted due to accidental drug overdose with Xarelto with a h/o atrial fibrillation requiring potassium replacement.   Sodium (mmol/L)  Date Value  03/08/2017 142  03/08/2014 136   Potassium (mmol/L)  Date Value  03/08/2017 3.3 (L)  03/08/2014 4.8   Magnesium (mg/dL)  Date Value  02/17/2014 2.0   Phosphorus (mg/dL)  Date Value  01/08/2017 4.4   Calcium (mg/dL)  Date Value  03/08/2017 8.6 (L)   Calcium, Total (mg/dL)  Date Value  03/08/2014 8.3 (L)   Albumin (g/dL)  Date Value  03/07/2017 3.6  03/08/2014 3.3 (L)   Will replace K with 40 meq KCl orally x 2 doses and check magnesium level.   Ulice Dash, PharmD Clinical Pharmacist

## 2017-03-08 NOTE — ED Provider Notes (Signed)
[facility] Emergency Department Provider Note   ____________________________________________   First MD Initiated Contact with Patient 03/08/17 0007     (approximate)  I have reviewed the triage vital signs and the nursing notes.   HISTORY  Chief Complaint Drug Overdose    HPI Andrea [name] is a 71 y.o. female brought to the ED from home with a chief complaint of accidental drug overdose. Patient has a history of CHF on continuous oxygen,on Eliquis, recently hospitalized at [facility] for CHF exacerbation. accidentally took her husband's medication tonight approximately 10:30 PM. Took 10 of his 20 mg Xarelto. Voices no medical complaints currently. Specifically, denies recent fever, chills, chest pain, shortness of breath, abdominal pain, nausea, vomiting, rectal bleeding.   Past Medical History:  Diagnosis Date  . CHF (congestive heart failure) ([place])   . Chronic back pain    Followed by pain clinic in [place]  . Endometriosis   . HTN (hypertension)   . Hypothyroidism   . IDDM (insulin dependent diabetes mellitus) ([place])   . Mild dementia   . NASH (nonalcoholic steatohepatitis)    has had liver biopsy in past  . Obesity   . PUD (peptic ulcer disease) 1990's    Patient Active Problem List   Diagnosis Date Noted  . Altered mental status   . Encephalopathy acute 01/05/2017  . CHF (congestive heart failure) ([place]) 06/18/2016  . GERD (gastroesophageal reflux disease) 03/07/2013  . Other and unspecified hyperlipidemia 03/07/2013  . Dizziness and giddiness 11/21/2012  . Urinary incontinence 09/11/2012  . Chronic low back pain 07/30/2012  . Anemia 07/30/2012  . Noncompliance 07/30/2012  . Neuropathy 01/12/2012  . Carotid stenosis, right 11/29/2011  . Diabetes mellitus type 2, uncontrolled ([place]) 11/29/2011  . Hypertension 11/29/2011  . Renal artery stenosis ([place]) 06/04/2011  . Constipation, chronic 06/04/2011  . Hypothyroidism 04/11/2011    Past  Surgical History:  Procedure Laterality Date  . CHOLECYSTECTOMY    . TOTAL ABDOMINAL HYSTERECTOMY W/ BILATERAL SALPINGOOPHORECTOMY      Prior to Admission medications   Medication Sig Start Date End Date Taking? Authorizing Provider  acetaminophen (TYLENOL) 325 MG tablet Take 650 mg by mouth 3 (three) times daily as needed.      [provider]  amLODipine (NORVASC) 10 MG tablet TAKE ONE TABLET BY MOUTH ONCE DAILY *NEEDS APPOINTMENT FOR FURTHER REFILLS* 02/23/14   Jackolyn Confer, MD  aspirin 81 MG tablet Take 81 mg by mouth daily.    [provider]  atorvastatin (LIPITOR) 20 MG tablet TAKE ONE TABLET BY MOUTH ONCE DAILY 12/10/13   Jackolyn Confer, MD  carvedilol (COREG) 12.5 MG tablet Take 1 tablet by mouth 2 (two) times daily with a meal.  11/30/16   [provider]  cholecalciferol (VITAMIN D) 1000 units tablet Take 1,000 Units by mouth daily.    [provider]  clopidogrel (PLAVIX) 75 MG tablet Take 1 tablet (75 mg total) by mouth daily. 12/29/12   Jackolyn Confer, MD  ELIQUIS 5 MG TABS tablet Take 1 tablet by mouth 2 (two) times daily. 12/28/16   [provider]  furosemide (LASIX) 40 MG tablet Take 40 mg by mouth daily.     [provider]  gabapentin (NEURONTIN) 300 MG capsule Take 600 mg by mouth 2 (two) times daily.    [provider]  hydrALAZINE (APRESOLINE) 25 MG tablet Take 1 tablet (25 mg total) by mouth every 8 (eight) hours. 01/10/17   Sudini, Alveta Heimlich,  MD  insulin aspart (NOVOLOG) 100 UNIT/ML injection Inject 3-15 Units into the skin 3 (three) times daily before meals. Sliding scale 10/29/12   Jackolyn Confer, MD  Insulin Glargine (TOUJEO SOLOSTAR) 300 UNIT/ML SOPN Inject 30 Units into the skin daily. 01/11/17   Hillary Bow, MD  levothyroxine (SYNTHROID, LEVOTHROID) 175 MCG tablet Take 1 tablet by mouth daily.  12/18/16   [provider]  OXYGEN Inhale 1 L into the lungs continuous.    [provider]  PARoxetine (PAXIL) 40 MG tablet Take 1 tablet by mouth daily.  12/13/16   [provider]  polyethylene glycol (MIRALAX / GLYCOLAX) packet Take 17 g by mouth daily. 01/10/17   Hillary Bow, MD  predniSONE (DELTASONE) 20 MG tablet Take 1 tablet (20 mg total) by mouth daily with breakfast. 01/10/17   Hillary Bow, MD  vitamin B-12 (CYANOCOBALAMIN) 1000 MCG tablet Take 5,000 mcg by mouth daily.     [provider]    Allergies Watermelon concentrate; Ace inhibitors; Celebrex [celecoxib]; Penicillins; and Sulfa antibiotics  Family History  Problem Relation Age of Onset  . COPD Sister   . Cancer Brother   . Emphysema Mother   . Heart disease Father     Social History Social History  Substance Use Topics  . Smoking status: Former Research scientist (life sciences)  . Smokeless tobacco: Never Used     Comment: Quit 1985  . Alcohol use No    Review of Systems  Constitutional: No fever/chills. Eyes: No visual changes. ENT: No sore throat. Cardiovascular: Denies chest pain. Respiratory: Denies shortness of breath. Gastrointestinal: No abdominal pain.  No nausea, no vomiting.  No diarrhea.  No constipation. Genitourinary: Negative for dysuria. Musculoskeletal: Negative for back pain. Skin: Negative for rash. Neurological: Negative for headaches, focal weakness or numbness.   ____________________________________________   PHYSICAL EXAM:  VITAL SIGNS: ED Triage Vitals  Enc Vitals Group     BP 03/07/17 2352 (!) 172/71     Pulse Rate 03/07/17 2352 77     Resp 03/07/17 2352 18     Temp 03/07/17 2352 98 F (36.7 C)     Temp Source 03/07/17 2352 Oral     SpO2 03/07/17 2352 96 %     Weight 03/07/17 2348 197 lb (89.4 kg)     Height --      Head Circumference --      [place] --      Pain Score --      Pain Loc --      Pain Edu? --      Excl. in [place]? --     Constitutional: Alert and oriented. Chronically ill appearing and in no acute distress. Eyes: Conjunctivae  are normal. PERRL. EOMI. Head: Atraumatic. Nose: No congestion/rhinnorhea. Mouth/Throat: Mucous membranes are moist.  Oropharynx non-erythematous. Neck: No stridor.   Cardiovascular: Normal rate, regular rhythm. Grossly normal heart sounds.  Good peripheral circulation. Respiratory: Normal respiratory effort.  No retractions. Lungs with faint bibasilar rales. Gastrointestinal: Soft and nontender. No distention. No abdominal bruits. No CVA tenderness. Musculoskeletal: No lower extremity tenderness nor edema.  No joint effusions. Neurologic:  Normal speech and language. No gross focal neurologic deficits are appreciated.  Skin:  Skin is warm, dry and intact. No rash noted. Scattered old ecchymosis on left upper extremity. Psychiatric: Mood and affect are normal. Speech and behavior are normal.  ____________________________________________   LABS (all labs ordered are listed, but only abnormal results are displayed)  Labs Reviewed  CBC  WITH DIFFERENTIAL/PLATELET - Abnormal; Notable for the following:       Result Value   Hemoglobin 11.0 (*)    HCT 34.3 (*)    RDW 15.6 (*)    All other components within normal limits  COMPREHENSIVE METABOLIC PANEL - Abnormal; Notable for the following:    Potassium 3.4 (*)    Chloride 98 (*)    Glucose, Bld 171 (*)    BUN 33 (*)    Creatinine, Ser 1.89 (*)    Calcium 8.8 (*)    ALT 13 (*)    GFR calc non Af Amer 26 (*)    GFR calc Af Amer 30 (*)    All other components within normal limits  FIBRIN DERIVATIVES D-DIMER ([facility] ONLY) - Abnormal; Notable for the following:    Fibrin derivatives D-dimer ([facility]) 838.15 (*)    All other components within normal limits  TROPONIN I  VITAMIN K1, SERUM  URINALYSIS, COMPLETE (UACMP) WITH MICROSCOPIC   ____________________________________________  EKG  ED ECG REPORT I, SUNG,JADE J, the attending physician, personally viewed and interpreted this ECG.   Date: 03/08/2017  EKG Time: 2351  Rate: 80   Rhythm: normal EKG, normal sinus rhythm  Axis: paced  Intervals: paced  ST&T Change: paced  ____________________________________________  RADIOLOGY  Dg Chest Port 1 View  Result Date: 03/08/2017 CLINICAL DATA:  Took Xarelto accidentally, with generalized abdominal pain and nausea. Initial encounter. EXAM: PORTABLE CHEST 1 VIEW COMPARISON:  Chest radiograph performed 01/07/2017 FINDINGS: A small left pleural effusion is noted, with left basilar airspace opacity likely reflecting atelectasis. The right lung appears relatively clear. No pneumothorax is seen. The cardiomediastinal silhouette is borderline normal in size. A pacemaker is noted overlying the left chest wall, with leads ending overlying the right atrium and right ventricle. No acute osseous abnormalities are seen. IMPRESSION: Small left pleural effusion, with left basilar airspace opacity likely reflecting atelectasis. Electronically Signed   By: Garald Balding M.D.   On: 03/08/2017 00:30    ____________________________________________   PROCEDURES  Procedure(s) performed: None  Procedures  Critical Care performed: No  ____________________________________________   INITIAL IMPRESSION / ASSESSMENT AND PLAN / ED COURSE  As part of my medical decision making, I reviewed the following data within the [place] History obtained from family, Nursing notes reviewed and incorporated, Labs reviewed, EKG interpreted, Discussed with admitting physician and Notes from prior ED visits.   71 year old female who presents with unintentional drug overdose of 200 mg Xarelto. Currently asymptomatic and voices no medical complaints. Per poison control's recommendations, recommend 24-hour period of observation for bleeding complications. In addition to basic lab work, recommend checking vitamin K level. hemoglobin and hematocrit stable, creatinine improved from recent. Discussed with hospitalist Dr. Anselm Jungling who will evaluate  patient in the emergency department for admission.      ____________________________________________   FINAL CLINICAL IMPRESSION(S) / ED DIAGNOSES  Final diagnoses:  Accidental drug overdose, initial encounter      NEW MEDICATIONS STARTED DURING THIS VISIT:  New Prescriptions   No medications on file     Note:  This document was prepared using Dragon voice recognition software and [place] include unintentional dictation errors.    Paulette Blanch, MD 03/08/17 [phone]

## 2017-03-08 NOTE — H&P (Signed)
[facility] [place] NAME: Andrea [name]    MR#:  [id number]  DATE OF BIRTH:  Krikorian 06, 1947  DATE OF ADMISSION:  03/07/2017  PRIMARY CARE PHYSICIAN: Kirk Ruths, MD   REQUESTING/REFERRING PHYSICIAN: Beather Arbour  CHIEF COMPLAINT:   Chief Complaint  Patient presents with  . Drug Overdose    HISTORY OF PRESENT ILLNESS: Andrea [name]  is a 71 y.o. female with a known history of CHF, Htn, Hypothyroidism, A fib, s/p PPM, NASH, IDDM- recent admisison and d/c from [facility] for CHF. She have some vision problems, so husband gives her meds. Last night- husband gave her his tablets Xarelto 20 mg each- she took 10 of them ( by husband's mistake), as every night she takes 5-6 tabs of hers, which husband gives her. Concern with this they came to ER. ER spoke to Poison control, they suggest to check Vit K and monitor frequent Hb and HCt. Pt have no active bleed. Her BP is high, as she did not take any of her meds in last night.  PAST MEDICAL HISTORY:   Past Medical History:  Diagnosis Date  . CHF (congestive heart failure) ([place])   . Chronic back pain    Followed by pain clinic in [place]  . Endometriosis   . HTN (hypertension)   . Hypothyroidism   . IDDM (insulin dependent diabetes mellitus) ([place])   . Mild dementia   . NASH (nonalcoholic steatohepatitis)    has had liver biopsy in past  . Obesity   . PUD (peptic ulcer disease) 1990's    PAST SURGICAL HISTORY: Past Surgical History:  Procedure Laterality Date  . CHOLECYSTECTOMY    . TOTAL ABDOMINAL HYSTERECTOMY W/ BILATERAL SALPINGOOPHORECTOMY      SOCIAL HISTORY:  Social History  Substance Use Topics  . Smoking status: Former Research scientist (life sciences)  . Smokeless tobacco: Never Used     Comment: Quit 1985  . Alcohol use No    FAMILY HISTORY:  Family History  Problem Relation Age of Onset  . COPD Sister   . Cancer Brother   . Emphysema Mother   . Heart disease Father     DRUG ALLERGIES:  Allergies   Allergen Reactions  . Watermelon Concentrate Anaphylaxis  . Ace Inhibitors   . Celebrex [Celecoxib] Rash  . Penicillins Rash  . Sulfa Antibiotics Rash    REVIEW OF SYSTEMS:   CONSTITUTIONAL: No fever, fatigue or weakness.  EYES: No blurred or double vision.  EARS, NOSE, AND THROAT: No tinnitus or ear pain.  RESPIRATORY: No cough, shortness of breath, wheezing or hemoptysis.  CARDIOVASCULAR: No chest pain, orthopnea, edema.  GASTROINTESTINAL: No nausea, vomiting, diarrhea or abdominal pain.  GENITOURINARY: No dysuria, hematuria.  ENDOCRINE: No polyuria, nocturia,  HEMATOLOGY: No anemia, easy bruising or bleeding SKIN: No rash or lesion. MUSCULOSKELETAL: No joint pain or arthritis.   NEUROLOGIC: No tingling, numbness, weakness.  PSYCHIATRY: No anxiety or depression.   MEDICATIONS AT HOME:  Prior to Admission medications   Medication Sig Start Date End Date Taking? Authorizing Provider  acetaminophen (TYLENOL) 325 MG tablet Take 650 mg by mouth 3 (three) times daily as needed.      [provider]  amLODipine (NORVASC) 10 MG tablet TAKE ONE TABLET BY MOUTH ONCE DAILY *NEEDS APPOINTMENT FOR FURTHER REFILLS* 02/23/14   Jackolyn Confer, MD  aspirin 81 MG tablet Take 81 mg by mouth daily.    [provider]  atorvastatin (LIPITOR) 20 MG tablet TAKE ONE  TABLET BY MOUTH ONCE DAILY 12/10/13   Jackolyn Confer, MD  carvedilol (COREG) 12.5 MG tablet Take 1 tablet by mouth 2 (two) times daily with a meal.  11/30/16   [provider]  chlorthalidone (HYGROTON) 25 MG tablet Take 1 tablet by mouth daily. 02/28/17   [provider]  cholecalciferol (VITAMIN D) 1000 units tablet Take 1,000 Units by mouth daily.    [provider]  cloNIDine (CATAPRES) 0.3 MG tablet Take 1 tablet by mouth 2 (two) times daily. 02/27/17   [provider]  clopidogrel (PLAVIX) 75 MG tablet Take 1 tablet (75 mg total) by mouth daily. 12/29/12   Jackolyn Confer,  MD  ELIQUIS 5 MG TABS tablet Take 1 tablet by mouth 2 (two) times daily. 12/28/16   [provider]  furosemide (LASIX) 40 MG tablet Take 40 mg by mouth daily.     [provider]  gabapentin (NEURONTIN) 300 MG capsule Take 600 mg by mouth 2 (two) times daily.    [provider]  hydrALAZINE (APRESOLINE) 25 MG tablet Take 1 tablet (25 mg total) by mouth every 8 (eight) hours. 01/10/17   Hillary Bow, MD  insulin aspart (NOVOLOG) 100 UNIT/ML injection Inject 3-15 Units into the skin 3 (three) times daily before meals. Sliding scale 10/29/12   Jackolyn Confer, MD  Insulin Glargine (TOUJEO SOLOSTAR) 300 UNIT/ML SOPN Inject 30 Units into the skin daily. 01/11/17   Hillary Bow, MD  levothyroxine (SYNTHROID, LEVOTHROID) 175 MCG tablet Take 1 tablet by mouth daily.  12/18/16   [provider]  losartan (COZAAR) 50 MG tablet Take 1 tablet by mouth daily. 02/26/17   [provider]  OXYGEN Inhale 1 L into the lungs continuous.    [provider]  PARoxetine (PAXIL) 40 MG tablet Take 1 tablet by mouth daily.  12/13/16   [provider]  polyethylene glycol (MIRALAX / GLYCOLAX) packet Take 17 g by mouth daily. 01/10/17   Hillary Bow, MD  predniSONE (DELTASONE) 20 MG tablet Take 1 tablet (20 mg total) by mouth daily with breakfast. 01/10/17   Hillary Bow, MD  vitamin B-12 (CYANOCOBALAMIN) 1000 MCG tablet Take 5,000 mcg by mouth daily.     [provider]      PHYSICAL EXAMINATION:   VITAL SIGNS: Blood pressure (!) 195/80, pulse 61, temperature 98 F (36.7 C), temperature source Oral, resp. rate 14, weight 89.4 kg (197 lb), SpO2 100 %.  GENERAL:  71 y.o.-year-old patient lying in the bed with no acute distress.  EYES: Pupils equal, round, reactive to light and accommodation. No scleral icterus. Extraocular muscles intact.  HEENT: Head atraumatic, normocephalic. Oropharynx and nasopharynx clear.  NECK:  Supple, no jugular venous  distention. No thyroid enlargement, no tenderness.  LUNGS: Normal breath sounds bilaterally, no wheezing, rales,rhonchi or crepitation. No use of accessory muscles of respiration.  CARDIOVASCULAR: S1, S2 normal. No murmurs, rubs, or gallops.  ABDOMEN: Soft, nontender, nondistended. Bowel sounds present. No organomegaly or mass.  EXTREMITIES: No pedal edema, cyanosis, or clubbing.  NEUROLOGIC: Cranial nerves II through XII are intact. Muscle strength 5/5 in all extremities. Sensation intact. Gait not checked.  PSYCHIATRIC: The patient is alert and oriented x 3.  SKIN: No obvious rash, lesion, or ulcer.   LABORATORY PANEL:   CBC  Recent Labs Lab 03/07/17 2358  WBC 6.5  HGB 11.0*  HCT 34.3*  PLT 178  MCV 83.2  MCH 26.7  MCHC 32.1  RDW 15.6*  LYMPHSABS  1.1  MONOABS 0.3  EOSABS 0.3  BASOSABS 0.1   ------------------------------------------------------------------------------------------------------------------  Chemistries   Recent Labs Lab 03/07/17 2358  NA 139  K 3.4*  CL 98*  CO2 32  GLUCOSE 171*  BUN 33*  CREATININE 1.89*  CALCIUM 8.8*  AST 23  ALT 13*  ALKPHOS 95  BILITOT 0.6   ------------------------------------------------------------------------------------------------------------------ estimated creatinine clearance is 29 mL/min (A) (by C-G formula based on SCr of 1.89 mg/dL (H)). ------------------------------------------------------------------------------------------------------------------ No results for input(s): TSH, T4TOTAL, T3FREE, THYROIDAB in the last 72 hours.  Invalid input(s): FREET3   Coagulation profile No results for input(s): INR, PROTIME in the last 168 hours. ------------------------------------------------------------------------------------------------------------------- No results for input(s): DDIMER in the last 72  hours. -------------------------------------------------------------------------------------------------------------------  Cardiac Enzymes  Recent Labs Lab 03/07/17 2358  TROPONINI <0.03   ------------------------------------------------------------------------------------------------------------------ Invalid input(s): POCBNP  ---------------------------------------------------------------------------------------------------------------  Urinalysis    Component Value Date/Time   COLORURINE YELLOW (A) 01/05/2017 1135   APPEARANCEUR HAZY (A) 01/05/2017 1135   APPEARANCEUR Clear 03/08/2014 2035   LABSPEC 1.017 01/05/2017 1135   LABSPEC 1.005 03/08/2014 2035   PHURINE 5.0 01/05/2017 1135   GLUCOSEU >=500 (A) 01/05/2017 1135   GLUCOSEU Negative 03/08/2014 2035   HGBUR SMALL (A) 01/05/2017 1135   BILIRUBINUR NEGATIVE 01/05/2017 1135   BILIRUBINUR Negative 03/08/2014 2035   KETONESUR NEGATIVE 01/05/2017 1135   PROTEINUR >=300 (A) 01/05/2017 1135   UROBILINOGEN 0.2 11/21/2012 1601   UROBILINOGEN 0.2 05/19/2011 1041   NITRITE NEGATIVE 01/05/2017 1135   LEUKOCYTESUR NEGATIVE 01/05/2017 1135   LEUKOCYTESUR Negative 03/08/2014 2035     RADIOLOGY: Dg Chest Port 1 View  Result Date: 03/08/2017 CLINICAL DATA:  Took Xarelto accidentally, with generalized abdominal pain and nausea. Initial encounter. EXAM: PORTABLE CHEST 1 VIEW COMPARISON:  Chest radiograph performed 01/07/2017 FINDINGS: A small left pleural effusion is noted, with left basilar airspace opacity likely reflecting atelectasis. The right lung appears relatively clear. No pneumothorax is seen. The cardiomediastinal silhouette is borderline normal in size. A pacemaker is noted overlying the left chest wall, with leads ending overlying the right atrium and right ventricle. No acute osseous abnormalities are seen. IMPRESSION: Small left pleural effusion, with left basilar airspace opacity likely reflecting atelectasis.  Electronically Signed   By: Garald Balding M.D.   On: 03/08/2017 00:30    EKG: Orders placed or performed during the hospital encounter of 03/07/17  . EKG 12-Lead  . EKG 12-Lead    IMPRESSION AND PLAN:  * Accidental drug overdose   Monitor, watch for any bleed   Frequent Hb and HCt check.   Vit K is ordered and in process.  * Ch diastolic CHF   Monitor, stable.  * Uncontrolled Htn   Give home meds, as she missed last night dose.  * DM   Cont Glargine, keep on ISS.  * A fib   Hold eliquis as overdose of xarelto.   COnt coreg  * HYperlipidemia   Cont atorvastatin.  All the records are reviewed and case discussed with ED provider. Management plans discussed with the patient, family and they are in agreement.  CODE STATUS: Full. Code Status History    Date Active Date Inactive Code Status Order ID Comments User Context   01/05/2017  4:29 PM 01/11/2017  6:02 PM Full Code [id number]  Nicholes Mango, MD ED   06/18/2016  3:29 AM 06/19/2016  7:54 PM Full Code [id number]  Saundra Shelling, MD Inpatient     Husband in room.  TOTAL TIME TAKING CARE OF THIS PATIENT:  45 minutes.    Vaughan Basta M.D on 03/08/2017   Between 7am to 6pm - Pager - [phone]  After 6pm go to [email or web address] - password [email or web address] [facility]  Office  [phone]  CC: Primary care physician; Kirk Ruths, MD   Note: This dictation was prepared with Dragon dictation along with smaller phrase technology. Any transcriptional errors that result from this process are unintentional.

## 2017-03-08 NOTE — Progress Notes (Signed)
[facility] [place] NAME: Andrea [name]    MR#:  [id number]  DATE OF BIRTH:  11/19/1945  SUBJECTIVE:  Came in after patient accidentally took anticoagulation No evidence of bleeding.  She is doing okay.  REVIEW OF SYSTEMS:   Review of Systems  Constitutional: Negative for chills, fever and weight loss.  HENT: Negative for ear discharge, ear pain and nosebleeds.   Eyes: Negative for blurred vision, pain and discharge.  Respiratory: Negative for sputum production, shortness of breath, wheezing and stridor.   Cardiovascular: Negative for chest pain, palpitations, orthopnea and PND.  Gastrointestinal: Negative for abdominal pain, diarrhea, nausea and vomiting.  Genitourinary: Negative for frequency and urgency.  Musculoskeletal: Negative for back pain and joint pain.  Neurological: Positive for weakness. Negative for sensory change, speech change and focal weakness.  Psychiatric/Behavioral: Negative for depression and hallucinations. The patient is not nervous/anxious.    Tolerating Diet:yes Tolerating PT: pending  DRUG ALLERGIES:   Allergies  Allergen Reactions  . Watermelon Concentrate Anaphylaxis  . Ace Inhibitors   . Celebrex [Celecoxib] Rash  . Penicillins Rash    Has patient had a PCN reaction causing immediate rash, facial/tongue/throat swelling, SOB or lightheadedness with hypotension: Yes Has patient had a PCN reaction causing severe rash involving mucus membranes or skin necrosis:  Has patient had a PCN reaction that required hospitalization: No Has patient had a PCN reaction occurring within the last 10 years: No If all of the above answers are "NO", then [place] proceed with Cephalosporin use. No  . Sulfa Antibiotics Rash    VITALS:  Blood pressure (!) 163/67, pulse (!) 59, temperature (!) 97.5 F (36.4 C), temperature source Oral, resp. rate 20, height 5\' 1"  (1.549 m), weight 84.4 kg (186 lb 1.6 oz), SpO2 96  %.  PHYSICAL EXAMINATION:   Physical Exam  GENERAL:  71 y.o.-year-old patient lying in the bed with no acute distress. obese EYES: Pupils equal, round, reactive to light and accommodation. No scleral icterus. Extraocular muscles intact.  HEENT: Head atraumatic, normocephalic. Oropharynx and nasopharynx clear.  NECK:  Supple, no jugular venous distention. No thyroid enlargement, no tenderness.  LUNGS: Normal breath sounds bilaterally, no wheezing, rales, rhonchi. No use of accessory muscles of respiration.  CARDIOVASCULAR: S1, S2 normal. No murmurs, rubs, or gallops.  ABDOMEN: Soft, nontender, nondistended. Bowel sounds present. No organomegaly or mass.  EXTREMITIES: No cyanosis, clubbing or edema b/l.    NEUROLOGIC: Cranial nerves II through XII are intact. No focal Motor or sensory deficits b/l.   PSYCHIATRIC:  patient is alert and oriented x 3.  SKIN: No obvious rash, lesion, or ulcer.   LABORATORY PANEL:  CBC  Recent Labs Lab 03/08/17 0453  WBC 6.3  HGB 10.7*  HCT 33.1*  PLT 158    Chemistries   Recent Labs Lab 03/07/17 2358 03/08/17 0453  NA 139 142  K 3.4* 3.3*  CL 98* 102  CO2 32 30  GLUCOSE 171* 95  BUN 33* 30*  CREATININE 1.89* 1.81*  CALCIUM 8.8* 8.6*  AST 23  --   ALT 13*  --   ALKPHOS 95  --   BILITOT 0.6  --    Cardiac Enzymes  Recent Labs Lab 03/07/17 2358  TROPONINI <0.03   RADIOLOGY:  Dg Chest Port 1 View  Result Date: 03/08/2017 CLINICAL DATA:  Took Xarelto accidentally, with generalized abdominal pain and nausea. Initial encounter. EXAM: PORTABLE CHEST 1 VIEW COMPARISON:  Chest radiograph performed  01/07/2017 FINDINGS: A small left pleural effusion is noted, with left basilar airspace opacity likely reflecting atelectasis. The right lung appears relatively clear. No pneumothorax is seen. The cardiomediastinal silhouette is borderline normal in size. A pacemaker is noted overlying the left chest wall, with leads ending overlying the right  atrium and right ventricle. No acute osseous abnormalities are seen. IMPRESSION: Small left pleural effusion, with left basilar airspace opacity likely reflecting atelectasis. Electronically Signed   By: Garald Balding M.D.   On: 03/08/2017 00:30   ASSESSMENT AND PLAN:  [name]  is a 71 y.o. female with a known history of CHF, Htn, Hypothyroidism, A fib, s/p PPM, NASH, IDDM- recent admisison and d/c from [facility] for CHF. She have some vision problems, so husband gives her meds. Last night- husband gave her his tablets Xarelto 20 mg each- she took 63 of them ( by husband's mistake)  * Accidental drug overdose with oral anticoagulation   Monitor, watch for any bleed   H/H stable   Patient received vitamin K per poison control recommendation  * Chronic diastolic CHF   Monitor, stable.  * Uncontrolled Htn Cont home meds  * DM   Cont Insulin Glargine, keep on ISS.  * A fib   Hold eliquis as overdose of xarelto.   Cont coreg  * HYperlipidemia   Cont atorvastatin.  Case discussed with Care Management/Social Worker. Management plans discussed with the patient, family and they are in agreement.  CODE STATUS: full code  DVT Prophylaxis: scd  TOTAL TIME TAKING CARE OF THIS PATIENT: 25 minutes.  >50% time spent on counselling and coordination of care  POSSIBLE D/C IN *1-2* DAYS, DEPENDING ON CLINICAL CONDITION.  Note: This dictation was prepared with Dragon dictation along with smaller phrase technology. Any transcriptional errors that result from this process are unintentional.  Ximena Todaro M.D on 03/08/2017 at 2:14 PM  Between 7am to 6pm - Pager - [phone]  After 6pm go to [email or web address] - password [email or web address] [facility]  Office  [phone]  CC: Primary care physician; Kirk Ruths, MD

## 2017-03-08 NOTE — ED Notes (Signed)
Nurse unable to get report at this time

## 2017-03-09 DIAGNOSIS — I5032 Chronic diastolic (congestive) heart failure: Secondary | ICD-10-CM | POA: Diagnosis not present

## 2017-03-09 DIAGNOSIS — I10 Essential (primary) hypertension: Secondary | ICD-10-CM | POA: Diagnosis not present

## 2017-03-09 DIAGNOSIS — I4891 Unspecified atrial fibrillation: Secondary | ICD-10-CM | POA: Diagnosis not present

## 2017-03-09 DIAGNOSIS — T50901A Poisoning by unspecified drugs, medicaments and biological substances, accidental (unintentional), initial encounter: Secondary | ICD-10-CM | POA: Diagnosis not present

## 2017-03-09 LAB — BASIC METABOLIC PANEL
Anion gap: 7 (ref 5–15)
BUN: 39 mg/dL — AB (ref 6–20)
CHLORIDE: 105 mmol/L (ref 101–111)
CO2: 29 mmol/L (ref 22–32)
Calcium: 8.5 mg/dL — ABNORMAL LOW (ref 8.9–10.3)
Creatinine, Ser: 1.94 mg/dL — ABNORMAL HIGH (ref 0.44–1.00)
GFR calc Af Amer: 29 mL/min — ABNORMAL LOW (ref >60)
GFR calc non Af Amer: 25 mL/min — ABNORMAL LOW (ref >60)
GLUCOSE: 107 mg/dL — AB (ref 65–99)
POTASSIUM: 4.7 mmol/L (ref 3.5–5.1)
Sodium: 141 mmol/L (ref 135–145)

## 2017-03-09 LAB — PROTIME-INR
INR: 3.69
Prothrombin Time: 36.3 seconds — ABNORMAL HIGH (ref 11.4–15.2)

## 2017-03-09 LAB — GLUCOSE, CAPILLARY
Glucose-Capillary: 97 mg/dL (ref 65–99)
Glucose-Capillary: 80 mg/dL (ref 65–99)

## 2017-03-09 LAB — MAGNESIUM: MAGNESIUM: 1.9 mg/dL (ref 1.7–2.4)

## 2017-03-09 NOTE — Progress Notes (Signed)
Dr Posey Pronto made aware of poison control recommendations

## 2017-03-09 NOTE — Progress Notes (Signed)
Spoke with poison control updated on pts current status, updated on recent PT/INR labs, per poison control toxicologist MD ok to discharge pt, pt needs to follow up with her PCP on Monday to determine when pt needs to restart her eliquis, pt needs to limit activity to reduce any risk for bleeding,- pt educated

## 2017-03-09 NOTE — Discharge Instructions (Signed)
Heart Failure Clinic appointment on March 19 2017 at 10:00am with Andrea [name], [place]. Please call [phone] to reschedule.   Patient is advised to follow-up with her primary care physician Dr. Alfonse Spruce at [facility] Monday or Tuesday of next week. She is also advised to hold off on her Eliquis and resume once she sees her primary care physician

## 2017-03-09 NOTE — Progress Notes (Signed)
Pharmacy Electrolyte Monitoring   71 y/o F admitted due to accidental drug overdose with Xarelto with a h/o atrial fibrillation requiring potassium replacement.   Sodium (mmol/L)  Date Value  03/09/2017 141  03/08/2014 136   Potassium (mmol/L)  Date Value  03/09/2017 4.7  03/08/2014 4.8   Magnesium (mg/dL)  Date Value  03/09/2017 1.9  02/17/2014 2.0   Phosphorus (mg/dL)  Date Value  01/08/2017 4.4   Calcium (mg/dL)  Date Value  03/09/2017 8.5 (L)   Calcium, Total (mg/dL)  Date Value  03/08/2014 8.3 (L)   Albumin (g/dL)  Date Value  03/07/2017 3.6  03/08/2014 3.3 (L)   K+ 4.7 Magnesium: 1.9. Will recheck in 2 days.   Larene Beach, PharmD

## 2017-03-09 NOTE — Discharge Summary (Signed)
Andrea [name] at [place] NAME: Andrea [name]    MR#:  [id number]  DATE OF BIRTH:  June 23, 1945  DATE OF ADMISSION:  03/07/2017 ADMITTING PHYSICIAN: Vaughan Basta, MD  DATE OF DISCHARGE: 03/09/17  PRIMARY CARE PHYSICIAN: Aline August, MD    ADMISSION DIAGNOSIS:  Accidental drug overdose, initial encounter [T50.901A]  DISCHARGE DIAGNOSIS:  Accidental overdose with Xarelto  SECONDARY DIAGNOSIS:   Past Medical History:  Diagnosis Date  . CHF (congestive heart failure) (Andrea [name])   . Chronic back pain    Followed by pain clinic in [place]  . Endometriosis   . HTN (hypertension)   . Hypothyroidism   . IDDM (insulin dependent diabetes mellitus) (Andrea [name])   . Mild dementia   . NASH (nonalcoholic steatohepatitis)    has had liver biopsy in past  . Obesity   . PUD (peptic ulcer disease) 43's    HOSPITAL COURSE:  CarlieMayis a 71 y.o.femalewith a known history of CHF, Htn, Hypothyroidism, A fib, s/p PPM, NASH, IDDM- recent admisison and d/c from [facility] for CHF. She have some vision problems, so husband gives her meds. Last night- husband gave her his tablets Xarelto 20 mg each- she took 59 of them ( by husband's mistake)  * Accidental drug overdose with oral anticoagulation Xarelto (pt's husbands pills) Monitor, watch for any bleed H/H stable Patient received vitamin K per poison control recommendation -Per poison control patient is okay to go home.  Follow up with primary care physician Dr. Alfonse [name] at [facility] on Monday or Tuesday of next week. -Continue to hold patient's Eliquis for now until she sees primary care physician and defer them for resumption of it next week  * Chronic diastolic CHF Monitor, stable. On chronic home oxygen  * Uncontrolled Htn Cont home meds  * DM Cont Insulin Glargine, keep on ISS.  * A fib Hold eliquis as overdose of xarelto. Cont coreg  * HYperlipidemia Cont  atorvastatin.  Overall hemodynamically stable.  Will discharge to home.  CONSULTS OBTAINED:    DRUG ALLERGIES:   Allergies  Allergen Reactions  . Watermelon Concentrate Anaphylaxis  . Ace Inhibitors   . Celebrex [Celecoxib] Rash  . Penicillins Rash    Has patient had a PCN reaction causing immediate rash, facial/tongue/throat swelling, SOB or lightheadedness with hypotension: Yes Has patient had a PCN reaction causing severe rash involving mucus membranes or skin necrosis:  Has patient had a PCN reaction that required hospitalization: No Has patient had a PCN reaction occurring within the last 10 years: No If all of the above answers are "NO", then [place] proceed with Cephalosporin use. No  . Sulfa Antibiotics Rash    DISCHARGE MEDICATIONS:   Current Discharge Medication List    CONTINUE these medications which have NOT CHANGED   Details  acetaminophen (TYLENOL) 325 MG tablet Take 650 mg by mouth 3 (three) times daily as needed.      albuterol (PROVENTIL) (2.5 MG/3ML) 0.083% nebulizer solution Take 2.5 mg by nebulization every 6 (six) hours as needed for wheezing or shortness of breath.    amLODipine (NORVASC) 10 MG tablet TAKE ONE TABLET BY MOUTH ONCE DAILY *NEEDS APPOINTMENT FOR FURTHER REFILLS* Qty: 30 tablet, Refills: 0    aspirin 81 MG tablet Take 81 mg by mouth daily.    atorvastatin (LIPITOR) 80 MG tablet Take 80 mg by mouth daily.    bumetanide (BUMEX) 1 MG tablet Take 3 mg by mouth 2 (two) times daily. Only taken  as needed depending on weigh loss per spouse    carvedilol (COREG) 25 MG tablet Take 1 tablet by mouth 2 (two) times daily with a meal.     cholecalciferol (VITAMIN D) 1000 units tablet Take 1,000 Units by mouth daily.    cloNIDine (CATAPRES) 0.3 MG tablet Take 1 tablet by mouth 2 (two) times daily.    gabapentin (NEURONTIN) 300 MG capsule Take 600 mg by mouth 2 (two) times daily.    insulin aspart (NOVOLOG) 100 UNIT/ML injection Inject 3-15 Units  into the skin 3 (three) times daily before meals. Sliding scale Qty: 1 vial, Refills: 3   Associated Diagnoses: Diabetes mellitus type 2, uncontrolled (HCC)    Insulin Glargine (TOUJEO SOLOSTAR) 300 UNIT/ML SOPN Inject 30 Units into the skin daily. Qty: 1 pen, Refills: 0    ipratropium (ATROVENT) 0.02 % nebulizer solution Take 0.5 mg by nebulization every 6 (six) hours as needed for wheezing or shortness of breath.    levothyroxine (SYNTHROID, LEVOTHROID) 175 MCG tablet Take 1 tablet by mouth daily. Given one hour prior to any other medications    losartan (COZAAR) 50 MG tablet Take 1 tablet by mouth daily.    PARoxetine (PAXIL) 40 MG tablet Take 1 tablet by mouth daily.     traZODone (DESYREL) 50 MG tablet Take 50 mg by mouth at bedtime as needed for sleep.    vitamin B-12 (CYANOCOBALAMIN) 1000 MCG tablet Take 5,000 mcg by mouth daily.     chlorthalidone (HYGROTON) 25 MG tablet Take 1 tablet by mouth daily.    OXYGEN Inhale 1 L into the lungs continuous.      STOP taking these medications     ELIQUIS 5 MG TABS tablet      clopidogrel (PLAVIX) 75 MG tablet      hydrALAZINE (APRESOLINE) 25 MG tablet      polyethylene glycol (MIRALAX / GLYCOLAX) packet      predniSONE (DELTASONE) 20 MG tablet         If you experience worsening of your admission symptoms, develop shortness of breath, life threatening emergency, suicidal or homicidal thoughts you must seek medical attention immediately by calling 911 or calling your MD immediately  if symptoms less severe.  You Must read complete instructions/literature along with all the possible adverse reactions/side effects for all the Medicines you take and that have been prescribed to you. Take any new Medicines after you have completely understood and accept all the possible adverse reactions/side effects.   Please note  You were cared for by a hospitalist during your hospital stay. If you have any questions about your discharge  medications or the care you received while you were in the hospital after you are discharged, you can call the unit and asked to speak with the hospitalist on call if the hospitalist that took care of you is not available. Once you are discharged, your primary care physician will handle any further medical issues. Please note that NO REFILLS for any discharge medications will be authorized once you are discharged, as it is imperative that you return to your primary care physician (or establish a relationship with a primary care physician if you do not have one) for your aftercare needs so that they can reassess your need for medications and monitor your lab values. Today   SUBJECTIVE   Doing well.  No complaints.  No issues with any active bleeding  VITAL SIGNS:  Blood pressure (!) 147/67, pulse 62, temperature 98.3 F (36.8 C),  temperature source Oral, resp. rate 20, height 5\' 1"  (1.549 m), weight 84.4 kg (186 lb 1.6 oz), SpO2 100 %.  I/O:   Intake/Output Summary (Last 24 hours) at 03/09/17 0946 Last data filed at 03/08/17 1600  Gross per 24 hour  Intake                0 ml  Output              350 ml  Net             -350 ml    PHYSICAL EXAMINATION:  GENERAL:  71 y.o.-year-old patient lying in the bed with no acute distress.  Obese EYES: Pupils equal, round, reactive to light and accommodation. No scleral icterus. Extraocular muscles intact.  HEENT: Head atraumatic, normocephalic. Oropharynx and nasopharynx clear.  NECK:  Supple, no jugular venous distention. No thyroid enlargement, no tenderness.  LUNGS: Normal breath sounds bilaterally, no wheezing, rales,rhonchi or crepitation. No use of accessory muscles of respiration.  CARDIOVASCULAR: S1, S2 normal. No murmurs, rubs, or gallops.  ABDOMEN: Soft, non-tender, non-distended. Bowel sounds present. No organomegaly or mass.  EXTREMITIES: No pedal edema, cyanosis, or clubbing.  NEUROLOGIC: Cranial nerves II through XII are intact.  Muscle strength 5/5 in all extremities. Sensation intact. Gait not checked.  PSYCHIATRIC: The patient is alert and oriented x 3.  SKIN: No obvious rash, lesion, or ulcer.   DATA REVIEW:   CBC   Recent Labs Lab 03/08/17 0453  WBC 6.3  HGB 10.7*  HCT 33.1*  PLT 158    Chemistries   Recent Labs Lab 03/07/17 2358  03/09/17 0436  NA 139  < > 141  K 3.4*  < > 4.7  CL 98*  < > 105  CO2 32  < > 29  GLUCOSE 171*  < > 107*  BUN 33*  < > 39*  CREATININE 1.89*  < > 1.94*  CALCIUM 8.8*  < > 8.5*  MG  --   < > 1.9  AST 23  --   --   ALT 13*  --   --   ALKPHOS 95  --   --   BILITOT 0.6  --   --   < > = values in this interval not displayed.  Microbiology Results   No results found for this or any previous visit (from the past 240 hour(s)).  RADIOLOGY:  Dg Chest Port 1 View  Result Date: 03/08/2017 CLINICAL DATA:  Took Xarelto accidentally, with generalized abdominal pain and nausea. Initial encounter. EXAM: PORTABLE CHEST 1 VIEW COMPARISON:  Chest radiograph performed 01/07/2017 FINDINGS: A small left pleural effusion is noted, with left basilar airspace opacity likely reflecting atelectasis. The right lung appears relatively clear. No pneumothorax is seen. The cardiomediastinal silhouette is borderline normal in size. A pacemaker is noted overlying the left chest wall, with leads ending overlying the right atrium and right ventricle. No acute osseous abnormalities are seen. IMPRESSION: Small left pleural effusion, with left basilar airspace opacity likely reflecting atelectasis. Electronically Signed   By: Garald Balding M.D.   On: 03/08/2017 00:30     Management plans discussed with the patient, family and they are in agreement.  CODE STATUS:     Code Status Orders        Start     Ordered   03/08/17 0309  Full code  Continuous     03/08/17 0309    Code Status History    Date Active Date  Inactive Code Status Order ID Comments User Context   01/05/2017  4:29 PM  01/11/2017  6:02 PM Full Code [id number]  Nicholes Mango, MD ED   06/18/2016  3:29 AM 06/19/2016  7:54 PM Full Code [id number]  Saundra Shelling, MD Inpatient      TOTAL TIME TAKING CARE OF THIS PATIENT: 40 minutes.    Emmanual Gauthreaux M.D on 03/09/2017 at 9:46 AM  Between 7am to 6pm - Pager - [phone] After 6pm go to [email or web address] - password [email or web address] [facility]  Office  [phone]  CC: Primary care physician; Aline August, MD

## 2017-03-09 NOTE — Progress Notes (Signed)
Pt being discharged home, discharge instructions reviewed with pt and husband, states understanding, pt to follow up with PCP on Monday, pt understands to refrain from any injury,  pt with no complaints at discharge

## 2017-03-11 DIAGNOSIS — I130 Hypertensive heart and chronic kidney disease with heart failure and stage 1 through stage 4 chronic kidney disease, or unspecified chronic kidney disease: Secondary | ICD-10-CM | POA: Diagnosis not present

## 2017-03-11 DIAGNOSIS — I5033 Acute on chronic diastolic (congestive) heart failure: Secondary | ICD-10-CM | POA: Diagnosis not present

## 2017-03-11 DIAGNOSIS — N184 Chronic kidney disease, stage 4 (severe): Secondary | ICD-10-CM | POA: Diagnosis not present

## 2017-03-11 DIAGNOSIS — E1122 Type 2 diabetes mellitus with diabetic chronic kidney disease: Secondary | ICD-10-CM | POA: Diagnosis not present

## 2017-03-11 DIAGNOSIS — J449 Chronic obstructive pulmonary disease, unspecified: Secondary | ICD-10-CM | POA: Diagnosis not present

## 2017-03-11 DIAGNOSIS — I441 Atrioventricular block, second degree: Secondary | ICD-10-CM | POA: Diagnosis not present

## 2017-03-11 DIAGNOSIS — E1165 Type 2 diabetes mellitus with hyperglycemia: Secondary | ICD-10-CM | POA: Diagnosis not present

## 2017-03-11 DIAGNOSIS — E1142 Type 2 diabetes mellitus with diabetic polyneuropathy: Secondary | ICD-10-CM | POA: Diagnosis not present

## 2017-03-11 DIAGNOSIS — I480 Paroxysmal atrial fibrillation: Secondary | ICD-10-CM | POA: Diagnosis not present

## 2017-03-12 DIAGNOSIS — E1122 Type 2 diabetes mellitus with diabetic chronic kidney disease: Secondary | ICD-10-CM | POA: Diagnosis not present

## 2017-03-12 DIAGNOSIS — I441 Atrioventricular block, second degree: Secondary | ICD-10-CM | POA: Diagnosis not present

## 2017-03-12 DIAGNOSIS — I5033 Acute on chronic diastolic (congestive) heart failure: Secondary | ICD-10-CM | POA: Diagnosis not present

## 2017-03-12 DIAGNOSIS — I130 Hypertensive heart and chronic kidney disease with heart failure and stage 1 through stage 4 chronic kidney disease, or unspecified chronic kidney disease: Secondary | ICD-10-CM | POA: Diagnosis not present

## 2017-03-12 DIAGNOSIS — N184 Chronic kidney disease, stage 4 (severe): Secondary | ICD-10-CM | POA: Diagnosis not present

## 2017-03-12 DIAGNOSIS — J449 Chronic obstructive pulmonary disease, unspecified: Secondary | ICD-10-CM | POA: Diagnosis not present

## 2017-03-12 DIAGNOSIS — I480 Paroxysmal atrial fibrillation: Secondary | ICD-10-CM | POA: Diagnosis not present

## 2017-03-12 DIAGNOSIS — E1142 Type 2 diabetes mellitus with diabetic polyneuropathy: Secondary | ICD-10-CM | POA: Diagnosis not present

## 2017-03-12 DIAGNOSIS — E1165 Type 2 diabetes mellitus with hyperglycemia: Secondary | ICD-10-CM | POA: Diagnosis not present

## 2017-03-13 DIAGNOSIS — I480 Paroxysmal atrial fibrillation: Secondary | ICD-10-CM | POA: Diagnosis not present

## 2017-03-13 DIAGNOSIS — I441 Atrioventricular block, second degree: Secondary | ICD-10-CM | POA: Diagnosis not present

## 2017-03-13 DIAGNOSIS — I5033 Acute on chronic diastolic (congestive) heart failure: Secondary | ICD-10-CM | POA: Diagnosis not present

## 2017-03-13 DIAGNOSIS — N184 Chronic kidney disease, stage 4 (severe): Secondary | ICD-10-CM | POA: Diagnosis not present

## 2017-03-13 DIAGNOSIS — J449 Chronic obstructive pulmonary disease, unspecified: Secondary | ICD-10-CM | POA: Diagnosis not present

## 2017-03-13 DIAGNOSIS — E1142 Type 2 diabetes mellitus with diabetic polyneuropathy: Secondary | ICD-10-CM | POA: Diagnosis not present

## 2017-03-13 DIAGNOSIS — E1165 Type 2 diabetes mellitus with hyperglycemia: Secondary | ICD-10-CM | POA: Diagnosis not present

## 2017-03-13 DIAGNOSIS — E1122 Type 2 diabetes mellitus with diabetic chronic kidney disease: Secondary | ICD-10-CM | POA: Diagnosis not present

## 2017-03-13 DIAGNOSIS — I130 Hypertensive heart and chronic kidney disease with heart failure and stage 1 through stage 4 chronic kidney disease, or unspecified chronic kidney disease: Secondary | ICD-10-CM | POA: Diagnosis not present

## 2017-03-15 DIAGNOSIS — E1165 Type 2 diabetes mellitus with hyperglycemia: Secondary | ICD-10-CM | POA: Diagnosis not present

## 2017-03-15 DIAGNOSIS — E1122 Type 2 diabetes mellitus with diabetic chronic kidney disease: Secondary | ICD-10-CM | POA: Diagnosis not present

## 2017-03-15 DIAGNOSIS — I130 Hypertensive heart and chronic kidney disease with heart failure and stage 1 through stage 4 chronic kidney disease, or unspecified chronic kidney disease: Secondary | ICD-10-CM | POA: Diagnosis not present

## 2017-03-15 DIAGNOSIS — I5033 Acute on chronic diastolic (congestive) heart failure: Secondary | ICD-10-CM | POA: Diagnosis not present

## 2017-03-15 DIAGNOSIS — I441 Atrioventricular block, second degree: Secondary | ICD-10-CM | POA: Diagnosis not present

## 2017-03-15 DIAGNOSIS — I480 Paroxysmal atrial fibrillation: Secondary | ICD-10-CM | POA: Diagnosis not present

## 2017-03-15 DIAGNOSIS — E1142 Type 2 diabetes mellitus with diabetic polyneuropathy: Secondary | ICD-10-CM | POA: Diagnosis not present

## 2017-03-15 DIAGNOSIS — J449 Chronic obstructive pulmonary disease, unspecified: Secondary | ICD-10-CM | POA: Diagnosis not present

## 2017-03-15 DIAGNOSIS — N184 Chronic kidney disease, stage 4 (severe): Secondary | ICD-10-CM | POA: Diagnosis not present

## 2017-03-15 LAB — VITAMIN K1, SERUM: VITAMIN K1: 0.97 ng/mL (ref 0.13–1.88)

## 2017-03-18 DIAGNOSIS — J449 Chronic obstructive pulmonary disease, unspecified: Secondary | ICD-10-CM | POA: Diagnosis not present

## 2017-03-18 DIAGNOSIS — E1165 Type 2 diabetes mellitus with hyperglycemia: Secondary | ICD-10-CM | POA: Diagnosis not present

## 2017-03-18 DIAGNOSIS — I130 Hypertensive heart and chronic kidney disease with heart failure and stage 1 through stage 4 chronic kidney disease, or unspecified chronic kidney disease: Secondary | ICD-10-CM | POA: Diagnosis not present

## 2017-03-18 DIAGNOSIS — N184 Chronic kidney disease, stage 4 (severe): Secondary | ICD-10-CM | POA: Diagnosis not present

## 2017-03-18 DIAGNOSIS — I441 Atrioventricular block, second degree: Secondary | ICD-10-CM | POA: Diagnosis not present

## 2017-03-18 DIAGNOSIS — E1142 Type 2 diabetes mellitus with diabetic polyneuropathy: Secondary | ICD-10-CM | POA: Diagnosis not present

## 2017-03-18 DIAGNOSIS — I5033 Acute on chronic diastolic (congestive) heart failure: Secondary | ICD-10-CM | POA: Diagnosis not present

## 2017-03-18 DIAGNOSIS — E1122 Type 2 diabetes mellitus with diabetic chronic kidney disease: Secondary | ICD-10-CM | POA: Diagnosis not present

## 2017-03-18 DIAGNOSIS — I480 Paroxysmal atrial fibrillation: Secondary | ICD-10-CM | POA: Diagnosis not present

## 2017-03-19 ENCOUNTER — Ambulatory Visit: Payer: Medicare HMO | Admitting: Family

## 2017-03-19 ENCOUNTER — Telehealth: Payer: Self-pay | Admitting: Family

## 2017-03-19 DIAGNOSIS — E119 Type 2 diabetes mellitus without complications: Secondary | ICD-10-CM | POA: Diagnosis not present

## 2017-03-19 DIAGNOSIS — E1165 Type 2 diabetes mellitus with hyperglycemia: Secondary | ICD-10-CM | POA: Diagnosis not present

## 2017-03-19 DIAGNOSIS — E1122 Type 2 diabetes mellitus with diabetic chronic kidney disease: Secondary | ICD-10-CM | POA: Diagnosis not present

## 2017-03-19 DIAGNOSIS — I5031 Acute diastolic (congestive) heart failure: Secondary | ICD-10-CM | POA: Diagnosis not present

## 2017-03-19 DIAGNOSIS — I130 Hypertensive heart and chronic kidney disease with heart failure and stage 1 through stage 4 chronic kidney disease, or unspecified chronic kidney disease: Secondary | ICD-10-CM | POA: Diagnosis not present

## 2017-03-19 DIAGNOSIS — I5033 Acute on chronic diastolic (congestive) heart failure: Secondary | ICD-10-CM | POA: Diagnosis not present

## 2017-03-19 DIAGNOSIS — I441 Atrioventricular block, second degree: Secondary | ICD-10-CM | POA: Diagnosis not present

## 2017-03-19 DIAGNOSIS — N184 Chronic kidney disease, stage 4 (severe): Secondary | ICD-10-CM | POA: Diagnosis not present

## 2017-03-19 DIAGNOSIS — E1142 Type 2 diabetes mellitus with diabetic polyneuropathy: Secondary | ICD-10-CM | POA: Diagnosis not present

## 2017-03-19 DIAGNOSIS — J449 Chronic obstructive pulmonary disease, unspecified: Secondary | ICD-10-CM | POA: Diagnosis not present

## 2017-03-19 DIAGNOSIS — I480 Paroxysmal atrial fibrillation: Secondary | ICD-10-CM | POA: Diagnosis not present

## 2017-03-19 NOTE — Telephone Encounter (Signed)
Patient did not show for her [facility] appointment on 03/19/17. Will attempt to reschedule.   Of note, this is the 5th appointment that she has missed.

## 2017-03-20 DIAGNOSIS — E1142 Type 2 diabetes mellitus with diabetic polyneuropathy: Secondary | ICD-10-CM | POA: Diagnosis not present

## 2017-03-20 DIAGNOSIS — I441 Atrioventricular block, second degree: Secondary | ICD-10-CM | POA: Diagnosis not present

## 2017-03-20 DIAGNOSIS — E1122 Type 2 diabetes mellitus with diabetic chronic kidney disease: Secondary | ICD-10-CM | POA: Diagnosis not present

## 2017-03-20 DIAGNOSIS — N184 Chronic kidney disease, stage 4 (severe): Secondary | ICD-10-CM | POA: Diagnosis not present

## 2017-03-20 DIAGNOSIS — J449 Chronic obstructive pulmonary disease, unspecified: Secondary | ICD-10-CM | POA: Diagnosis not present

## 2017-03-20 DIAGNOSIS — I130 Hypertensive heart and chronic kidney disease with heart failure and stage 1 through stage 4 chronic kidney disease, or unspecified chronic kidney disease: Secondary | ICD-10-CM | POA: Diagnosis not present

## 2017-03-20 DIAGNOSIS — E1165 Type 2 diabetes mellitus with hyperglycemia: Secondary | ICD-10-CM | POA: Diagnosis not present

## 2017-03-20 DIAGNOSIS — I480 Paroxysmal atrial fibrillation: Secondary | ICD-10-CM | POA: Diagnosis not present

## 2017-03-20 DIAGNOSIS — I5033 Acute on chronic diastolic (congestive) heart failure: Secondary | ICD-10-CM | POA: Diagnosis not present

## 2017-03-22 DIAGNOSIS — E1165 Type 2 diabetes mellitus with hyperglycemia: Secondary | ICD-10-CM | POA: Diagnosis not present

## 2017-03-22 DIAGNOSIS — I480 Paroxysmal atrial fibrillation: Secondary | ICD-10-CM | POA: Diagnosis not present

## 2017-03-22 DIAGNOSIS — N184 Chronic kidney disease, stage 4 (severe): Secondary | ICD-10-CM | POA: Diagnosis not present

## 2017-03-22 DIAGNOSIS — I441 Atrioventricular block, second degree: Secondary | ICD-10-CM | POA: Diagnosis not present

## 2017-03-22 DIAGNOSIS — E1142 Type 2 diabetes mellitus with diabetic polyneuropathy: Secondary | ICD-10-CM | POA: Diagnosis not present

## 2017-03-22 DIAGNOSIS — J449 Chronic obstructive pulmonary disease, unspecified: Secondary | ICD-10-CM | POA: Diagnosis not present

## 2017-03-22 DIAGNOSIS — E1122 Type 2 diabetes mellitus with diabetic chronic kidney disease: Secondary | ICD-10-CM | POA: Diagnosis not present

## 2017-03-22 DIAGNOSIS — I130 Hypertensive heart and chronic kidney disease with heart failure and stage 1 through stage 4 chronic kidney disease, or unspecified chronic kidney disease: Secondary | ICD-10-CM | POA: Diagnosis not present

## 2017-03-22 DIAGNOSIS — I5033 Acute on chronic diastolic (congestive) heart failure: Secondary | ICD-10-CM | POA: Diagnosis not present

## 2017-03-25 DIAGNOSIS — I441 Atrioventricular block, second degree: Secondary | ICD-10-CM | POA: Diagnosis not present

## 2017-03-25 DIAGNOSIS — I480 Paroxysmal atrial fibrillation: Secondary | ICD-10-CM | POA: Diagnosis not present

## 2017-03-25 DIAGNOSIS — I5033 Acute on chronic diastolic (congestive) heart failure: Secondary | ICD-10-CM | POA: Diagnosis not present

## 2017-03-25 DIAGNOSIS — E1142 Type 2 diabetes mellitus with diabetic polyneuropathy: Secondary | ICD-10-CM | POA: Diagnosis not present

## 2017-03-25 DIAGNOSIS — I130 Hypertensive heart and chronic kidney disease with heart failure and stage 1 through stage 4 chronic kidney disease, or unspecified chronic kidney disease: Secondary | ICD-10-CM | POA: Diagnosis not present

## 2017-03-25 DIAGNOSIS — J449 Chronic obstructive pulmonary disease, unspecified: Secondary | ICD-10-CM | POA: Diagnosis not present

## 2017-03-25 DIAGNOSIS — E1122 Type 2 diabetes mellitus with diabetic chronic kidney disease: Secondary | ICD-10-CM | POA: Diagnosis not present

## 2017-03-25 DIAGNOSIS — E1165 Type 2 diabetes mellitus with hyperglycemia: Secondary | ICD-10-CM | POA: Diagnosis not present

## 2017-03-25 DIAGNOSIS — N184 Chronic kidney disease, stage 4 (severe): Secondary | ICD-10-CM | POA: Diagnosis not present

## 2017-03-26 DIAGNOSIS — N184 Chronic kidney disease, stage 4 (severe): Secondary | ICD-10-CM | POA: Diagnosis not present

## 2017-03-26 DIAGNOSIS — J449 Chronic obstructive pulmonary disease, unspecified: Secondary | ICD-10-CM | POA: Diagnosis not present

## 2017-03-26 DIAGNOSIS — E1122 Type 2 diabetes mellitus with diabetic chronic kidney disease: Secondary | ICD-10-CM | POA: Diagnosis not present

## 2017-03-26 DIAGNOSIS — E1165 Type 2 diabetes mellitus with hyperglycemia: Secondary | ICD-10-CM | POA: Diagnosis not present

## 2017-03-26 DIAGNOSIS — I5033 Acute on chronic diastolic (congestive) heart failure: Secondary | ICD-10-CM | POA: Diagnosis not present

## 2017-03-26 DIAGNOSIS — I480 Paroxysmal atrial fibrillation: Secondary | ICD-10-CM | POA: Diagnosis not present

## 2017-03-26 DIAGNOSIS — E1142 Type 2 diabetes mellitus with diabetic polyneuropathy: Secondary | ICD-10-CM | POA: Diagnosis not present

## 2017-03-26 DIAGNOSIS — I130 Hypertensive heart and chronic kidney disease with heart failure and stage 1 through stage 4 chronic kidney disease, or unspecified chronic kidney disease: Secondary | ICD-10-CM | POA: Diagnosis not present

## 2017-03-26 DIAGNOSIS — I441 Atrioventricular block, second degree: Secondary | ICD-10-CM | POA: Diagnosis not present

## 2017-03-27 DIAGNOSIS — J439 Emphysema, unspecified: Secondary | ICD-10-CM | POA: Diagnosis not present

## 2017-03-27 DIAGNOSIS — I130 Hypertensive heart and chronic kidney disease with heart failure and stage 1 through stage 4 chronic kidney disease, or unspecified chronic kidney disease: Secondary | ICD-10-CM | POA: Diagnosis not present

## 2017-03-27 DIAGNOSIS — E1165 Type 2 diabetes mellitus with hyperglycemia: Secondary | ICD-10-CM | POA: Diagnosis not present

## 2017-03-27 DIAGNOSIS — N184 Chronic kidney disease, stage 4 (severe): Secondary | ICD-10-CM | POA: Diagnosis not present

## 2017-03-27 DIAGNOSIS — J449 Chronic obstructive pulmonary disease, unspecified: Secondary | ICD-10-CM | POA: Diagnosis not present

## 2017-03-27 DIAGNOSIS — I2510 Atherosclerotic heart disease of native coronary artery without angina pectoris: Secondary | ICD-10-CM | POA: Diagnosis not present

## 2017-03-27 DIAGNOSIS — E1142 Type 2 diabetes mellitus with diabetic polyneuropathy: Secondary | ICD-10-CM | POA: Diagnosis not present

## 2017-03-27 DIAGNOSIS — I441 Atrioventricular block, second degree: Secondary | ICD-10-CM | POA: Diagnosis not present

## 2017-03-27 DIAGNOSIS — I480 Paroxysmal atrial fibrillation: Secondary | ICD-10-CM | POA: Diagnosis not present

## 2017-03-27 DIAGNOSIS — E1122 Type 2 diabetes mellitus with diabetic chronic kidney disease: Secondary | ICD-10-CM | POA: Diagnosis not present

## 2017-03-27 DIAGNOSIS — G3184 Mild cognitive impairment, so stated: Secondary | ICD-10-CM | POA: Diagnosis not present

## 2017-03-27 DIAGNOSIS — I5033 Acute on chronic diastolic (congestive) heart failure: Secondary | ICD-10-CM | POA: Diagnosis not present

## 2017-03-29 DIAGNOSIS — I480 Paroxysmal atrial fibrillation: Secondary | ICD-10-CM | POA: Diagnosis not present

## 2017-03-29 DIAGNOSIS — N182 Chronic kidney disease, stage 2 (mild): Secondary | ICD-10-CM | POA: Diagnosis not present

## 2017-03-29 DIAGNOSIS — E1122 Type 2 diabetes mellitus with diabetic chronic kidney disease: Secondary | ICD-10-CM | POA: Diagnosis not present

## 2017-03-29 DIAGNOSIS — I5030 Unspecified diastolic (congestive) heart failure: Secondary | ICD-10-CM | POA: Diagnosis not present

## 2017-03-29 DIAGNOSIS — G4733 Obstructive sleep apnea (adult) (pediatric): Secondary | ICD-10-CM | POA: Diagnosis not present

## 2017-03-29 DIAGNOSIS — I441 Atrioventricular block, second degree: Secondary | ICD-10-CM | POA: Diagnosis not present

## 2017-03-29 DIAGNOSIS — I130 Hypertensive heart and chronic kidney disease with heart failure and stage 1 through stage 4 chronic kidney disease, or unspecified chronic kidney disease: Secondary | ICD-10-CM | POA: Diagnosis not present

## 2017-03-29 DIAGNOSIS — E1140 Type 2 diabetes mellitus with diabetic neuropathy, unspecified: Secondary | ICD-10-CM | POA: Diagnosis not present

## 2017-03-29 DIAGNOSIS — E039 Hypothyroidism, unspecified: Secondary | ICD-10-CM | POA: Diagnosis not present

## 2017-03-30 DIAGNOSIS — N184 Chronic kidney disease, stage 4 (severe): Secondary | ICD-10-CM | POA: Diagnosis not present

## 2017-03-30 DIAGNOSIS — E1165 Type 2 diabetes mellitus with hyperglycemia: Secondary | ICD-10-CM | POA: Diagnosis not present

## 2017-03-30 DIAGNOSIS — E1142 Type 2 diabetes mellitus with diabetic polyneuropathy: Secondary | ICD-10-CM | POA: Diagnosis not present

## 2017-03-30 DIAGNOSIS — I480 Paroxysmal atrial fibrillation: Secondary | ICD-10-CM | POA: Diagnosis not present

## 2017-03-30 DIAGNOSIS — J449 Chronic obstructive pulmonary disease, unspecified: Secondary | ICD-10-CM | POA: Diagnosis not present

## 2017-03-30 DIAGNOSIS — E1122 Type 2 diabetes mellitus with diabetic chronic kidney disease: Secondary | ICD-10-CM | POA: Diagnosis not present

## 2017-03-30 DIAGNOSIS — I130 Hypertensive heart and chronic kidney disease with heart failure and stage 1 through stage 4 chronic kidney disease, or unspecified chronic kidney disease: Secondary | ICD-10-CM | POA: Diagnosis not present

## 2017-03-30 DIAGNOSIS — I441 Atrioventricular block, second degree: Secondary | ICD-10-CM | POA: Diagnosis not present

## 2017-03-30 DIAGNOSIS — I5033 Acute on chronic diastolic (congestive) heart failure: Secondary | ICD-10-CM | POA: Diagnosis not present

## 2017-04-01 DIAGNOSIS — I5033 Acute on chronic diastolic (congestive) heart failure: Secondary | ICD-10-CM | POA: Diagnosis not present

## 2017-04-01 DIAGNOSIS — I480 Paroxysmal atrial fibrillation: Secondary | ICD-10-CM | POA: Diagnosis not present

## 2017-04-01 DIAGNOSIS — E1142 Type 2 diabetes mellitus with diabetic polyneuropathy: Secondary | ICD-10-CM | POA: Diagnosis not present

## 2017-04-01 DIAGNOSIS — J449 Chronic obstructive pulmonary disease, unspecified: Secondary | ICD-10-CM | POA: Diagnosis not present

## 2017-04-01 DIAGNOSIS — E1122 Type 2 diabetes mellitus with diabetic chronic kidney disease: Secondary | ICD-10-CM | POA: Diagnosis not present

## 2017-04-01 DIAGNOSIS — E1165 Type 2 diabetes mellitus with hyperglycemia: Secondary | ICD-10-CM | POA: Diagnosis not present

## 2017-04-01 DIAGNOSIS — I130 Hypertensive heart and chronic kidney disease with heart failure and stage 1 through stage 4 chronic kidney disease, or unspecified chronic kidney disease: Secondary | ICD-10-CM | POA: Diagnosis not present

## 2017-04-01 DIAGNOSIS — I441 Atrioventricular block, second degree: Secondary | ICD-10-CM | POA: Diagnosis not present

## 2017-04-01 DIAGNOSIS — N184 Chronic kidney disease, stage 4 (severe): Secondary | ICD-10-CM | POA: Diagnosis not present

## 2017-04-02 DIAGNOSIS — E1165 Type 2 diabetes mellitus with hyperglycemia: Secondary | ICD-10-CM | POA: Diagnosis not present

## 2017-04-02 DIAGNOSIS — I130 Hypertensive heart and chronic kidney disease with heart failure and stage 1 through stage 4 chronic kidney disease, or unspecified chronic kidney disease: Secondary | ICD-10-CM | POA: Diagnosis not present

## 2017-04-02 DIAGNOSIS — I480 Paroxysmal atrial fibrillation: Secondary | ICD-10-CM | POA: Diagnosis not present

## 2017-04-02 DIAGNOSIS — I5033 Acute on chronic diastolic (congestive) heart failure: Secondary | ICD-10-CM | POA: Diagnosis not present

## 2017-04-02 DIAGNOSIS — N184 Chronic kidney disease, stage 4 (severe): Secondary | ICD-10-CM | POA: Diagnosis not present

## 2017-04-02 DIAGNOSIS — E1122 Type 2 diabetes mellitus with diabetic chronic kidney disease: Secondary | ICD-10-CM | POA: Diagnosis not present

## 2017-04-02 DIAGNOSIS — I441 Atrioventricular block, second degree: Secondary | ICD-10-CM | POA: Diagnosis not present

## 2017-04-02 DIAGNOSIS — E1142 Type 2 diabetes mellitus with diabetic polyneuropathy: Secondary | ICD-10-CM | POA: Diagnosis not present

## 2017-04-02 DIAGNOSIS — J449 Chronic obstructive pulmonary disease, unspecified: Secondary | ICD-10-CM | POA: Diagnosis not present

## 2017-04-03 DIAGNOSIS — I480 Paroxysmal atrial fibrillation: Secondary | ICD-10-CM | POA: Diagnosis not present

## 2017-04-03 DIAGNOSIS — I441 Atrioventricular block, second degree: Secondary | ICD-10-CM | POA: Diagnosis not present

## 2017-04-03 DIAGNOSIS — J449 Chronic obstructive pulmonary disease, unspecified: Secondary | ICD-10-CM | POA: Diagnosis not present

## 2017-04-03 DIAGNOSIS — N184 Chronic kidney disease, stage 4 (severe): Secondary | ICD-10-CM | POA: Diagnosis not present

## 2017-04-03 DIAGNOSIS — I5033 Acute on chronic diastolic (congestive) heart failure: Secondary | ICD-10-CM | POA: Diagnosis not present

## 2017-04-03 DIAGNOSIS — E1142 Type 2 diabetes mellitus with diabetic polyneuropathy: Secondary | ICD-10-CM | POA: Diagnosis not present

## 2017-04-03 DIAGNOSIS — E1165 Type 2 diabetes mellitus with hyperglycemia: Secondary | ICD-10-CM | POA: Diagnosis not present

## 2017-04-03 DIAGNOSIS — I130 Hypertensive heart and chronic kidney disease with heart failure and stage 1 through stage 4 chronic kidney disease, or unspecified chronic kidney disease: Secondary | ICD-10-CM | POA: Diagnosis not present

## 2017-04-03 DIAGNOSIS — E1122 Type 2 diabetes mellitus with diabetic chronic kidney disease: Secondary | ICD-10-CM | POA: Diagnosis not present

## 2017-04-08 DIAGNOSIS — I130 Hypertensive heart and chronic kidney disease with heart failure and stage 1 through stage 4 chronic kidney disease, or unspecified chronic kidney disease: Secondary | ICD-10-CM | POA: Diagnosis not present

## 2017-04-08 DIAGNOSIS — I441 Atrioventricular block, second degree: Secondary | ICD-10-CM | POA: Diagnosis not present

## 2017-04-08 DIAGNOSIS — J449 Chronic obstructive pulmonary disease, unspecified: Secondary | ICD-10-CM | POA: Diagnosis not present

## 2017-04-08 DIAGNOSIS — I480 Paroxysmal atrial fibrillation: Secondary | ICD-10-CM | POA: Diagnosis not present

## 2017-04-08 DIAGNOSIS — E1122 Type 2 diabetes mellitus with diabetic chronic kidney disease: Secondary | ICD-10-CM | POA: Diagnosis not present

## 2017-04-08 DIAGNOSIS — E1165 Type 2 diabetes mellitus with hyperglycemia: Secondary | ICD-10-CM | POA: Diagnosis not present

## 2017-04-08 DIAGNOSIS — E1142 Type 2 diabetes mellitus with diabetic polyneuropathy: Secondary | ICD-10-CM | POA: Diagnosis not present

## 2017-04-08 DIAGNOSIS — I5033 Acute on chronic diastolic (congestive) heart failure: Secondary | ICD-10-CM | POA: Diagnosis not present

## 2017-04-08 DIAGNOSIS — N184 Chronic kidney disease, stage 4 (severe): Secondary | ICD-10-CM | POA: Diagnosis not present

## 2017-04-09 DIAGNOSIS — I5033 Acute on chronic diastolic (congestive) heart failure: Secondary | ICD-10-CM | POA: Diagnosis not present

## 2017-04-09 DIAGNOSIS — I480 Paroxysmal atrial fibrillation: Secondary | ICD-10-CM | POA: Diagnosis not present

## 2017-04-09 DIAGNOSIS — E1142 Type 2 diabetes mellitus with diabetic polyneuropathy: Secondary | ICD-10-CM | POA: Diagnosis not present

## 2017-04-09 DIAGNOSIS — J449 Chronic obstructive pulmonary disease, unspecified: Secondary | ICD-10-CM | POA: Diagnosis not present

## 2017-04-09 DIAGNOSIS — I441 Atrioventricular block, second degree: Secondary | ICD-10-CM | POA: Diagnosis not present

## 2017-04-09 DIAGNOSIS — E1122 Type 2 diabetes mellitus with diabetic chronic kidney disease: Secondary | ICD-10-CM | POA: Diagnosis not present

## 2017-04-09 DIAGNOSIS — N184 Chronic kidney disease, stage 4 (severe): Secondary | ICD-10-CM | POA: Diagnosis not present

## 2017-04-09 DIAGNOSIS — I130 Hypertensive heart and chronic kidney disease with heart failure and stage 1 through stage 4 chronic kidney disease, or unspecified chronic kidney disease: Secondary | ICD-10-CM | POA: Diagnosis not present

## 2017-04-09 DIAGNOSIS — E1165 Type 2 diabetes mellitus with hyperglycemia: Secondary | ICD-10-CM | POA: Diagnosis not present

## 2017-04-10 DIAGNOSIS — I5033 Acute on chronic diastolic (congestive) heart failure: Secondary | ICD-10-CM | POA: Diagnosis not present

## 2017-04-10 DIAGNOSIS — E1165 Type 2 diabetes mellitus with hyperglycemia: Secondary | ICD-10-CM | POA: Diagnosis not present

## 2017-04-10 DIAGNOSIS — J449 Chronic obstructive pulmonary disease, unspecified: Secondary | ICD-10-CM | POA: Diagnosis not present

## 2017-04-10 DIAGNOSIS — N184 Chronic kidney disease, stage 4 (severe): Secondary | ICD-10-CM | POA: Diagnosis not present

## 2017-04-10 DIAGNOSIS — I441 Atrioventricular block, second degree: Secondary | ICD-10-CM | POA: Diagnosis not present

## 2017-04-10 DIAGNOSIS — E1142 Type 2 diabetes mellitus with diabetic polyneuropathy: Secondary | ICD-10-CM | POA: Diagnosis not present

## 2017-04-10 DIAGNOSIS — E1122 Type 2 diabetes mellitus with diabetic chronic kidney disease: Secondary | ICD-10-CM | POA: Diagnosis not present

## 2017-04-10 DIAGNOSIS — I480 Paroxysmal atrial fibrillation: Secondary | ICD-10-CM | POA: Diagnosis not present

## 2017-04-10 DIAGNOSIS — I130 Hypertensive heart and chronic kidney disease with heart failure and stage 1 through stage 4 chronic kidney disease, or unspecified chronic kidney disease: Secondary | ICD-10-CM | POA: Diagnosis not present

## 2017-04-15 DIAGNOSIS — E1165 Type 2 diabetes mellitus with hyperglycemia: Secondary | ICD-10-CM | POA: Diagnosis not present

## 2017-04-15 DIAGNOSIS — N184 Chronic kidney disease, stage 4 (severe): Secondary | ICD-10-CM | POA: Diagnosis not present

## 2017-04-15 DIAGNOSIS — I5033 Acute on chronic diastolic (congestive) heart failure: Secondary | ICD-10-CM | POA: Diagnosis not present

## 2017-04-15 DIAGNOSIS — E1122 Type 2 diabetes mellitus with diabetic chronic kidney disease: Secondary | ICD-10-CM | POA: Diagnosis not present

## 2017-04-15 DIAGNOSIS — E1142 Type 2 diabetes mellitus with diabetic polyneuropathy: Secondary | ICD-10-CM | POA: Diagnosis not present

## 2017-04-15 DIAGNOSIS — I130 Hypertensive heart and chronic kidney disease with heart failure and stage 1 through stage 4 chronic kidney disease, or unspecified chronic kidney disease: Secondary | ICD-10-CM | POA: Diagnosis not present

## 2017-04-15 DIAGNOSIS — I480 Paroxysmal atrial fibrillation: Secondary | ICD-10-CM | POA: Diagnosis not present

## 2017-04-15 DIAGNOSIS — I441 Atrioventricular block, second degree: Secondary | ICD-10-CM | POA: Diagnosis not present

## 2017-04-15 DIAGNOSIS — J449 Chronic obstructive pulmonary disease, unspecified: Secondary | ICD-10-CM | POA: Diagnosis not present

## 2017-04-18 DIAGNOSIS — E119 Type 2 diabetes mellitus without complications: Secondary | ICD-10-CM | POA: Diagnosis not present

## 2017-04-18 DIAGNOSIS — I5031 Acute diastolic (congestive) heart failure: Secondary | ICD-10-CM | POA: Diagnosis not present

## 2017-04-18 DIAGNOSIS — J449 Chronic obstructive pulmonary disease, unspecified: Secondary | ICD-10-CM | POA: Diagnosis not present

## 2017-04-24 DIAGNOSIS — I5033 Acute on chronic diastolic (congestive) heart failure: Secondary | ICD-10-CM | POA: Diagnosis not present

## 2017-04-24 DIAGNOSIS — E1142 Type 2 diabetes mellitus with diabetic polyneuropathy: Secondary | ICD-10-CM | POA: Diagnosis not present

## 2017-04-24 DIAGNOSIS — I441 Atrioventricular block, second degree: Secondary | ICD-10-CM | POA: Diagnosis not present

## 2017-04-24 DIAGNOSIS — N189 Chronic kidney disease, unspecified: Secondary | ICD-10-CM | POA: Diagnosis not present

## 2017-04-24 DIAGNOSIS — M19071 Primary osteoarthritis, right ankle and foot: Secondary | ICD-10-CM | POA: Diagnosis not present

## 2017-04-24 DIAGNOSIS — E1122 Type 2 diabetes mellitus with diabetic chronic kidney disease: Secondary | ICD-10-CM | POA: Diagnosis not present

## 2017-04-24 DIAGNOSIS — L03115 Cellulitis of right lower limb: Secondary | ICD-10-CM | POA: Diagnosis not present

## 2017-04-24 DIAGNOSIS — E1165 Type 2 diabetes mellitus with hyperglycemia: Secondary | ICD-10-CM | POA: Diagnosis not present

## 2017-04-24 DIAGNOSIS — I130 Hypertensive heart and chronic kidney disease with heart failure and stage 1 through stage 4 chronic kidney disease, or unspecified chronic kidney disease: Secondary | ICD-10-CM | POA: Diagnosis not present

## 2017-04-24 DIAGNOSIS — I480 Paroxysmal atrial fibrillation: Secondary | ICD-10-CM | POA: Diagnosis not present

## 2017-04-24 DIAGNOSIS — J449 Chronic obstructive pulmonary disease, unspecified: Secondary | ICD-10-CM | POA: Diagnosis not present

## 2017-04-24 DIAGNOSIS — M79671 Pain in right foot: Secondary | ICD-10-CM | POA: Diagnosis not present

## 2017-04-24 DIAGNOSIS — N184 Chronic kidney disease, stage 4 (severe): Secondary | ICD-10-CM | POA: Diagnosis not present

## 2017-04-24 DIAGNOSIS — F0390 Unspecified dementia without behavioral disturbance: Secondary | ICD-10-CM | POA: Diagnosis not present

## 2017-04-24 DIAGNOSIS — M7989 Other specified soft tissue disorders: Secondary | ICD-10-CM | POA: Diagnosis not present

## 2017-04-24 DIAGNOSIS — M19079 Primary osteoarthritis, unspecified ankle and foot: Secondary | ICD-10-CM | POA: Diagnosis not present

## 2017-04-24 DIAGNOSIS — Z950 Presence of cardiac pacemaker: Secondary | ICD-10-CM | POA: Diagnosis not present

## 2017-04-24 DIAGNOSIS — E785 Hyperlipidemia, unspecified: Secondary | ICD-10-CM | POA: Diagnosis not present

## 2017-04-24 DIAGNOSIS — E039 Hypothyroidism, unspecified: Secondary | ICD-10-CM | POA: Diagnosis not present

## 2017-04-24 DIAGNOSIS — I509 Heart failure, unspecified: Secondary | ICD-10-CM | POA: Diagnosis not present

## 2017-04-26 DIAGNOSIS — I5033 Acute on chronic diastolic (congestive) heart failure: Secondary | ICD-10-CM | POA: Diagnosis not present

## 2017-04-26 DIAGNOSIS — I441 Atrioventricular block, second degree: Secondary | ICD-10-CM | POA: Diagnosis not present

## 2017-04-26 DIAGNOSIS — E1142 Type 2 diabetes mellitus with diabetic polyneuropathy: Secondary | ICD-10-CM | POA: Diagnosis not present

## 2017-04-26 DIAGNOSIS — E1122 Type 2 diabetes mellitus with diabetic chronic kidney disease: Secondary | ICD-10-CM | POA: Diagnosis not present

## 2017-04-26 DIAGNOSIS — N184 Chronic kidney disease, stage 4 (severe): Secondary | ICD-10-CM | POA: Diagnosis not present

## 2017-04-26 DIAGNOSIS — J439 Emphysema, unspecified: Secondary | ICD-10-CM | POA: Diagnosis not present

## 2017-04-26 DIAGNOSIS — G3184 Mild cognitive impairment, so stated: Secondary | ICD-10-CM | POA: Diagnosis not present

## 2017-04-26 DIAGNOSIS — E1165 Type 2 diabetes mellitus with hyperglycemia: Secondary | ICD-10-CM | POA: Diagnosis not present

## 2017-04-26 DIAGNOSIS — J449 Chronic obstructive pulmonary disease, unspecified: Secondary | ICD-10-CM | POA: Diagnosis not present

## 2017-04-26 DIAGNOSIS — I2510 Atherosclerotic heart disease of native coronary artery without angina pectoris: Secondary | ICD-10-CM | POA: Diagnosis not present

## 2017-04-26 DIAGNOSIS — I130 Hypertensive heart and chronic kidney disease with heart failure and stage 1 through stage 4 chronic kidney disease, or unspecified chronic kidney disease: Secondary | ICD-10-CM | POA: Diagnosis not present

## 2017-04-26 DIAGNOSIS — I480 Paroxysmal atrial fibrillation: Secondary | ICD-10-CM | POA: Diagnosis not present

## 2017-04-29 DIAGNOSIS — I480 Paroxysmal atrial fibrillation: Secondary | ICD-10-CM | POA: Diagnosis not present

## 2017-04-29 DIAGNOSIS — L03115 Cellulitis of right lower limb: Secondary | ICD-10-CM | POA: Diagnosis not present

## 2017-04-29 DIAGNOSIS — N184 Chronic kidney disease, stage 4 (severe): Secondary | ICD-10-CM | POA: Diagnosis not present

## 2017-04-29 DIAGNOSIS — J449 Chronic obstructive pulmonary disease, unspecified: Secondary | ICD-10-CM | POA: Diagnosis not present

## 2017-04-29 DIAGNOSIS — E1165 Type 2 diabetes mellitus with hyperglycemia: Secondary | ICD-10-CM | POA: Diagnosis not present

## 2017-04-29 DIAGNOSIS — I5033 Acute on chronic diastolic (congestive) heart failure: Secondary | ICD-10-CM | POA: Diagnosis not present

## 2017-04-29 DIAGNOSIS — I441 Atrioventricular block, second degree: Secondary | ICD-10-CM | POA: Diagnosis not present

## 2017-04-29 DIAGNOSIS — E1122 Type 2 diabetes mellitus with diabetic chronic kidney disease: Secondary | ICD-10-CM | POA: Diagnosis not present

## 2017-04-29 DIAGNOSIS — I130 Hypertensive heart and chronic kidney disease with heart failure and stage 1 through stage 4 chronic kidney disease, or unspecified chronic kidney disease: Secondary | ICD-10-CM | POA: Diagnosis not present

## 2017-05-01 DIAGNOSIS — E1165 Type 2 diabetes mellitus with hyperglycemia: Secondary | ICD-10-CM | POA: Diagnosis not present

## 2017-05-01 DIAGNOSIS — J449 Chronic obstructive pulmonary disease, unspecified: Secondary | ICD-10-CM | POA: Diagnosis not present

## 2017-05-01 DIAGNOSIS — L03115 Cellulitis of right lower limb: Secondary | ICD-10-CM | POA: Diagnosis not present

## 2017-05-01 DIAGNOSIS — I480 Paroxysmal atrial fibrillation: Secondary | ICD-10-CM | POA: Diagnosis not present

## 2017-05-01 DIAGNOSIS — N184 Chronic kidney disease, stage 4 (severe): Secondary | ICD-10-CM | POA: Diagnosis not present

## 2017-05-01 DIAGNOSIS — I441 Atrioventricular block, second degree: Secondary | ICD-10-CM | POA: Diagnosis not present

## 2017-05-01 DIAGNOSIS — I130 Hypertensive heart and chronic kidney disease with heart failure and stage 1 through stage 4 chronic kidney disease, or unspecified chronic kidney disease: Secondary | ICD-10-CM | POA: Diagnosis not present

## 2017-05-01 DIAGNOSIS — I5033 Acute on chronic diastolic (congestive) heart failure: Secondary | ICD-10-CM | POA: Diagnosis not present

## 2017-05-01 DIAGNOSIS — E1122 Type 2 diabetes mellitus with diabetic chronic kidney disease: Secondary | ICD-10-CM | POA: Diagnosis not present

## 2017-05-06 DIAGNOSIS — N184 Chronic kidney disease, stage 4 (severe): Secondary | ICD-10-CM | POA: Diagnosis not present

## 2017-05-06 DIAGNOSIS — I5033 Acute on chronic diastolic (congestive) heart failure: Secondary | ICD-10-CM | POA: Diagnosis not present

## 2017-05-06 DIAGNOSIS — I130 Hypertensive heart and chronic kidney disease with heart failure and stage 1 through stage 4 chronic kidney disease, or unspecified chronic kidney disease: Secondary | ICD-10-CM | POA: Diagnosis not present

## 2017-05-06 DIAGNOSIS — I480 Paroxysmal atrial fibrillation: Secondary | ICD-10-CM | POA: Diagnosis not present

## 2017-05-06 DIAGNOSIS — I441 Atrioventricular block, second degree: Secondary | ICD-10-CM | POA: Diagnosis not present

## 2017-05-06 DIAGNOSIS — J449 Chronic obstructive pulmonary disease, unspecified: Secondary | ICD-10-CM | POA: Diagnosis not present

## 2017-05-06 DIAGNOSIS — L03115 Cellulitis of right lower limb: Secondary | ICD-10-CM | POA: Diagnosis not present

## 2017-05-06 DIAGNOSIS — E1165 Type 2 diabetes mellitus with hyperglycemia: Secondary | ICD-10-CM | POA: Diagnosis not present

## 2017-05-06 DIAGNOSIS — E1122 Type 2 diabetes mellitus with diabetic chronic kidney disease: Secondary | ICD-10-CM | POA: Diagnosis not present

## 2017-05-08 DIAGNOSIS — I441 Atrioventricular block, second degree: Secondary | ICD-10-CM | POA: Diagnosis not present

## 2017-05-08 DIAGNOSIS — E1165 Type 2 diabetes mellitus with hyperglycemia: Secondary | ICD-10-CM | POA: Diagnosis not present

## 2017-05-08 DIAGNOSIS — J449 Chronic obstructive pulmonary disease, unspecified: Secondary | ICD-10-CM | POA: Diagnosis not present

## 2017-05-08 DIAGNOSIS — L03115 Cellulitis of right lower limb: Secondary | ICD-10-CM | POA: Diagnosis not present

## 2017-05-08 DIAGNOSIS — I130 Hypertensive heart and chronic kidney disease with heart failure and stage 1 through stage 4 chronic kidney disease, or unspecified chronic kidney disease: Secondary | ICD-10-CM | POA: Diagnosis not present

## 2017-05-08 DIAGNOSIS — N184 Chronic kidney disease, stage 4 (severe): Secondary | ICD-10-CM | POA: Diagnosis not present

## 2017-05-08 DIAGNOSIS — I5033 Acute on chronic diastolic (congestive) heart failure: Secondary | ICD-10-CM | POA: Diagnosis not present

## 2017-05-08 DIAGNOSIS — E1122 Type 2 diabetes mellitus with diabetic chronic kidney disease: Secondary | ICD-10-CM | POA: Diagnosis not present

## 2017-05-08 DIAGNOSIS — I480 Paroxysmal atrial fibrillation: Secondary | ICD-10-CM | POA: Diagnosis not present

## 2017-05-09 DIAGNOSIS — E1165 Type 2 diabetes mellitus with hyperglycemia: Secondary | ICD-10-CM | POA: Diagnosis not present

## 2017-05-09 DIAGNOSIS — E113311 Type 2 diabetes mellitus with moderate nonproliferative diabetic retinopathy with macular edema, right eye: Secondary | ICD-10-CM | POA: Diagnosis not present

## 2017-05-09 DIAGNOSIS — E113512 Type 2 diabetes mellitus with proliferative diabetic retinopathy with macular edema, left eye: Secondary | ICD-10-CM | POA: Diagnosis not present

## 2017-05-09 DIAGNOSIS — I480 Paroxysmal atrial fibrillation: Secondary | ICD-10-CM | POA: Diagnosis not present

## 2017-05-09 DIAGNOSIS — N184 Chronic kidney disease, stage 4 (severe): Secondary | ICD-10-CM | POA: Diagnosis not present

## 2017-05-09 DIAGNOSIS — H4312 Vitreous hemorrhage, left eye: Secondary | ICD-10-CM | POA: Diagnosis not present

## 2017-05-09 DIAGNOSIS — I5033 Acute on chronic diastolic (congestive) heart failure: Secondary | ICD-10-CM | POA: Diagnosis not present

## 2017-05-09 DIAGNOSIS — L03115 Cellulitis of right lower limb: Secondary | ICD-10-CM | POA: Diagnosis not present

## 2017-05-09 DIAGNOSIS — H01006 Unspecified blepharitis left eye, unspecified eyelid: Secondary | ICD-10-CM | POA: Diagnosis not present

## 2017-05-09 DIAGNOSIS — I441 Atrioventricular block, second degree: Secondary | ICD-10-CM | POA: Diagnosis not present

## 2017-05-09 DIAGNOSIS — H2511 Age-related nuclear cataract, right eye: Secondary | ICD-10-CM | POA: Diagnosis not present

## 2017-05-09 DIAGNOSIS — J449 Chronic obstructive pulmonary disease, unspecified: Secondary | ICD-10-CM | POA: Diagnosis not present

## 2017-05-09 DIAGNOSIS — I130 Hypertensive heart and chronic kidney disease with heart failure and stage 1 through stage 4 chronic kidney disease, or unspecified chronic kidney disease: Secondary | ICD-10-CM | POA: Diagnosis not present

## 2017-05-09 DIAGNOSIS — H01003 Unspecified blepharitis right eye, unspecified eyelid: Secondary | ICD-10-CM | POA: Diagnosis not present

## 2017-05-09 DIAGNOSIS — E1122 Type 2 diabetes mellitus with diabetic chronic kidney disease: Secondary | ICD-10-CM | POA: Diagnosis not present

## 2017-05-16 DIAGNOSIS — E1165 Type 2 diabetes mellitus with hyperglycemia: Secondary | ICD-10-CM | POA: Diagnosis not present

## 2017-05-16 DIAGNOSIS — E1122 Type 2 diabetes mellitus with diabetic chronic kidney disease: Secondary | ICD-10-CM | POA: Diagnosis not present

## 2017-05-16 DIAGNOSIS — J449 Chronic obstructive pulmonary disease, unspecified: Secondary | ICD-10-CM | POA: Diagnosis not present

## 2017-05-16 DIAGNOSIS — I441 Atrioventricular block, second degree: Secondary | ICD-10-CM | POA: Diagnosis not present

## 2017-05-16 DIAGNOSIS — I130 Hypertensive heart and chronic kidney disease with heart failure and stage 1 through stage 4 chronic kidney disease, or unspecified chronic kidney disease: Secondary | ICD-10-CM | POA: Diagnosis not present

## 2017-05-16 DIAGNOSIS — N184 Chronic kidney disease, stage 4 (severe): Secondary | ICD-10-CM | POA: Diagnosis not present

## 2017-05-16 DIAGNOSIS — L03115 Cellulitis of right lower limb: Secondary | ICD-10-CM | POA: Diagnosis not present

## 2017-05-16 DIAGNOSIS — I480 Paroxysmal atrial fibrillation: Secondary | ICD-10-CM | POA: Diagnosis not present

## 2017-05-16 DIAGNOSIS — I5033 Acute on chronic diastolic (congestive) heart failure: Secondary | ICD-10-CM | POA: Diagnosis not present

## 2017-05-17 DIAGNOSIS — E1165 Type 2 diabetes mellitus with hyperglycemia: Secondary | ICD-10-CM | POA: Diagnosis not present

## 2017-05-17 DIAGNOSIS — I5033 Acute on chronic diastolic (congestive) heart failure: Secondary | ICD-10-CM | POA: Diagnosis not present

## 2017-05-17 DIAGNOSIS — E1122 Type 2 diabetes mellitus with diabetic chronic kidney disease: Secondary | ICD-10-CM | POA: Diagnosis not present

## 2017-05-17 DIAGNOSIS — J449 Chronic obstructive pulmonary disease, unspecified: Secondary | ICD-10-CM | POA: Diagnosis not present

## 2017-05-17 DIAGNOSIS — L03115 Cellulitis of right lower limb: Secondary | ICD-10-CM | POA: Diagnosis not present

## 2017-05-17 DIAGNOSIS — I480 Paroxysmal atrial fibrillation: Secondary | ICD-10-CM | POA: Diagnosis not present

## 2017-05-17 DIAGNOSIS — I130 Hypertensive heart and chronic kidney disease with heart failure and stage 1 through stage 4 chronic kidney disease, or unspecified chronic kidney disease: Secondary | ICD-10-CM | POA: Diagnosis not present

## 2017-05-17 DIAGNOSIS — I441 Atrioventricular block, second degree: Secondary | ICD-10-CM | POA: Diagnosis not present

## 2017-05-17 DIAGNOSIS — N184 Chronic kidney disease, stage 4 (severe): Secondary | ICD-10-CM | POA: Diagnosis not present

## 2017-05-19 DIAGNOSIS — I5031 Acute diastolic (congestive) heart failure: Secondary | ICD-10-CM | POA: Diagnosis not present

## 2017-05-19 DIAGNOSIS — J449 Chronic obstructive pulmonary disease, unspecified: Secondary | ICD-10-CM | POA: Diagnosis not present

## 2017-05-19 DIAGNOSIS — E119 Type 2 diabetes mellitus without complications: Secondary | ICD-10-CM | POA: Diagnosis not present

## 2017-05-20 DIAGNOSIS — L03115 Cellulitis of right lower limb: Secondary | ICD-10-CM | POA: Diagnosis not present

## 2017-05-20 DIAGNOSIS — I130 Hypertensive heart and chronic kidney disease with heart failure and stage 1 through stage 4 chronic kidney disease, or unspecified chronic kidney disease: Secondary | ICD-10-CM | POA: Diagnosis not present

## 2017-05-20 DIAGNOSIS — E1165 Type 2 diabetes mellitus with hyperglycemia: Secondary | ICD-10-CM | POA: Diagnosis not present

## 2017-05-20 DIAGNOSIS — N184 Chronic kidney disease, stage 4 (severe): Secondary | ICD-10-CM | POA: Diagnosis not present

## 2017-05-20 DIAGNOSIS — E1122 Type 2 diabetes mellitus with diabetic chronic kidney disease: Secondary | ICD-10-CM | POA: Diagnosis not present

## 2017-05-20 DIAGNOSIS — I5033 Acute on chronic diastolic (congestive) heart failure: Secondary | ICD-10-CM | POA: Diagnosis not present

## 2017-05-20 DIAGNOSIS — I480 Paroxysmal atrial fibrillation: Secondary | ICD-10-CM | POA: Diagnosis not present

## 2017-05-20 DIAGNOSIS — I441 Atrioventricular block, second degree: Secondary | ICD-10-CM | POA: Diagnosis not present

## 2017-05-20 DIAGNOSIS — J449 Chronic obstructive pulmonary disease, unspecified: Secondary | ICD-10-CM | POA: Diagnosis not present

## 2017-05-22 DIAGNOSIS — N184 Chronic kidney disease, stage 4 (severe): Secondary | ICD-10-CM | POA: Diagnosis not present

## 2017-05-22 DIAGNOSIS — I5033 Acute on chronic diastolic (congestive) heart failure: Secondary | ICD-10-CM | POA: Diagnosis not present

## 2017-05-22 DIAGNOSIS — E1165 Type 2 diabetes mellitus with hyperglycemia: Secondary | ICD-10-CM | POA: Diagnosis not present

## 2017-05-22 DIAGNOSIS — I130 Hypertensive heart and chronic kidney disease with heart failure and stage 1 through stage 4 chronic kidney disease, or unspecified chronic kidney disease: Secondary | ICD-10-CM | POA: Diagnosis not present

## 2017-05-22 DIAGNOSIS — I441 Atrioventricular block, second degree: Secondary | ICD-10-CM | POA: Diagnosis not present

## 2017-05-22 DIAGNOSIS — E1122 Type 2 diabetes mellitus with diabetic chronic kidney disease: Secondary | ICD-10-CM | POA: Diagnosis not present

## 2017-05-22 DIAGNOSIS — I480 Paroxysmal atrial fibrillation: Secondary | ICD-10-CM | POA: Diagnosis not present

## 2017-05-22 DIAGNOSIS — L03115 Cellulitis of right lower limb: Secondary | ICD-10-CM | POA: Diagnosis not present

## 2017-05-22 DIAGNOSIS — J449 Chronic obstructive pulmonary disease, unspecified: Secondary | ICD-10-CM | POA: Diagnosis not present

## 2017-05-27 DIAGNOSIS — G3184 Mild cognitive impairment, so stated: Secondary | ICD-10-CM | POA: Diagnosis not present

## 2017-05-27 DIAGNOSIS — I5033 Acute on chronic diastolic (congestive) heart failure: Secondary | ICD-10-CM | POA: Diagnosis not present

## 2017-05-27 DIAGNOSIS — I2510 Atherosclerotic heart disease of native coronary artery without angina pectoris: Secondary | ICD-10-CM | POA: Diagnosis not present

## 2017-05-27 DIAGNOSIS — J439 Emphysema, unspecified: Secondary | ICD-10-CM | POA: Diagnosis not present

## 2017-06-19 DIAGNOSIS — E113413 Type 2 diabetes mellitus with severe nonproliferative diabetic retinopathy with macular edema, bilateral: Secondary | ICD-10-CM | POA: Diagnosis not present

## 2017-06-19 DIAGNOSIS — J449 Chronic obstructive pulmonary disease, unspecified: Secondary | ICD-10-CM | POA: Diagnosis not present

## 2017-06-19 DIAGNOSIS — E119 Type 2 diabetes mellitus without complications: Secondary | ICD-10-CM | POA: Diagnosis not present

## 2017-06-19 DIAGNOSIS — I10 Essential (primary) hypertension: Secondary | ICD-10-CM | POA: Diagnosis not present

## 2017-06-19 DIAGNOSIS — E1140 Type 2 diabetes mellitus with diabetic neuropathy, unspecified: Secondary | ICD-10-CM | POA: Diagnosis not present

## 2017-06-19 DIAGNOSIS — M47816 Spondylosis without myelopathy or radiculopathy, lumbar region: Secondary | ICD-10-CM | POA: Diagnosis not present

## 2017-06-19 DIAGNOSIS — I5031 Acute diastolic (congestive) heart failure: Secondary | ICD-10-CM | POA: Diagnosis not present

## 2017-06-19 DIAGNOSIS — M25522 Pain in left elbow: Secondary | ICD-10-CM | POA: Diagnosis not present

## 2017-06-19 DIAGNOSIS — I480 Paroxysmal atrial fibrillation: Secondary | ICD-10-CM | POA: Diagnosis not present

## 2017-06-19 DIAGNOSIS — Z794 Long term (current) use of insulin: Secondary | ICD-10-CM | POA: Diagnosis not present

## 2017-06-25 DIAGNOSIS — H01006 Unspecified blepharitis left eye, unspecified eyelid: Secondary | ICD-10-CM | POA: Diagnosis not present

## 2017-06-25 DIAGNOSIS — H2511 Age-related nuclear cataract, right eye: Secondary | ICD-10-CM | POA: Diagnosis not present

## 2017-06-25 DIAGNOSIS — H01003 Unspecified blepharitis right eye, unspecified eyelid: Secondary | ICD-10-CM | POA: Diagnosis not present

## 2017-06-25 DIAGNOSIS — H4312 Vitreous hemorrhage, left eye: Secondary | ICD-10-CM | POA: Diagnosis not present

## 2017-06-25 DIAGNOSIS — E113313 Type 2 diabetes mellitus with moderate nonproliferative diabetic retinopathy with macular edema, bilateral: Secondary | ICD-10-CM | POA: Diagnosis not present

## 2017-06-27 DIAGNOSIS — I5033 Acute on chronic diastolic (congestive) heart failure: Secondary | ICD-10-CM | POA: Diagnosis not present

## 2017-06-27 DIAGNOSIS — I2510 Atherosclerotic heart disease of native coronary artery without angina pectoris: Secondary | ICD-10-CM | POA: Diagnosis not present

## 2017-06-27 DIAGNOSIS — J439 Emphysema, unspecified: Secondary | ICD-10-CM | POA: Diagnosis not present

## 2017-06-27 DIAGNOSIS — G3184 Mild cognitive impairment, so stated: Secondary | ICD-10-CM | POA: Diagnosis not present

## 2017-07-01 DIAGNOSIS — M1188 Other specified crystal arthropathies, vertebrae: Secondary | ICD-10-CM | POA: Diagnosis not present

## 2017-07-05 DIAGNOSIS — I2510 Atherosclerotic heart disease of native coronary artery without angina pectoris: Secondary | ICD-10-CM | POA: Diagnosis not present

## 2017-07-05 DIAGNOSIS — E1140 Type 2 diabetes mellitus with diabetic neuropathy, unspecified: Secondary | ICD-10-CM | POA: Diagnosis not present

## 2017-07-05 DIAGNOSIS — M19022 Primary osteoarthritis, left elbow: Secondary | ICD-10-CM | POA: Diagnosis not present

## 2017-07-05 DIAGNOSIS — I5030 Unspecified diastolic (congestive) heart failure: Secondary | ICD-10-CM | POA: Diagnosis not present

## 2017-07-05 DIAGNOSIS — Z794 Long term (current) use of insulin: Secondary | ICD-10-CM | POA: Diagnosis not present

## 2017-07-05 DIAGNOSIS — M25522 Pain in left elbow: Secondary | ICD-10-CM | POA: Diagnosis not present

## 2017-07-05 DIAGNOSIS — I441 Atrioventricular block, second degree: Secondary | ICD-10-CM | POA: Diagnosis not present

## 2017-07-05 DIAGNOSIS — Z7982 Long term (current) use of aspirin: Secondary | ICD-10-CM | POA: Diagnosis not present

## 2017-07-05 DIAGNOSIS — N189 Chronic kidney disease, unspecified: Secondary | ICD-10-CM | POA: Diagnosis not present

## 2017-07-05 DIAGNOSIS — I480 Paroxysmal atrial fibrillation: Secondary | ICD-10-CM | POA: Diagnosis not present

## 2017-07-05 DIAGNOSIS — M549 Dorsalgia, unspecified: Secondary | ICD-10-CM | POA: Diagnosis not present

## 2017-07-05 DIAGNOSIS — G4733 Obstructive sleep apnea (adult) (pediatric): Secondary | ICD-10-CM | POA: Diagnosis not present

## 2017-07-05 DIAGNOSIS — Z950 Presence of cardiac pacemaker: Secondary | ICD-10-CM | POA: Diagnosis not present

## 2017-07-05 DIAGNOSIS — E1122 Type 2 diabetes mellitus with diabetic chronic kidney disease: Secondary | ICD-10-CM | POA: Diagnosis not present

## 2017-07-05 DIAGNOSIS — I130 Hypertensive heart and chronic kidney disease with heart failure and stage 1 through stage 4 chronic kidney disease, or unspecified chronic kidney disease: Secondary | ICD-10-CM | POA: Diagnosis not present

## 2017-07-05 DIAGNOSIS — N184 Chronic kidney disease, stage 4 (severe): Secondary | ICD-10-CM | POA: Diagnosis not present

## 2017-07-05 DIAGNOSIS — M25552 Pain in left hip: Secondary | ICD-10-CM | POA: Diagnosis not present

## 2017-07-17 DIAGNOSIS — I5031 Acute diastolic (congestive) heart failure: Secondary | ICD-10-CM | POA: Diagnosis not present

## 2017-07-17 DIAGNOSIS — J449 Chronic obstructive pulmonary disease, unspecified: Secondary | ICD-10-CM | POA: Diagnosis not present

## 2017-07-17 DIAGNOSIS — E119 Type 2 diabetes mellitus without complications: Secondary | ICD-10-CM | POA: Diagnosis not present

## 2017-07-25 DIAGNOSIS — I5033 Acute on chronic diastolic (congestive) heart failure: Secondary | ICD-10-CM | POA: Diagnosis not present

## 2017-07-25 DIAGNOSIS — G3184 Mild cognitive impairment, so stated: Secondary | ICD-10-CM | POA: Diagnosis not present

## 2017-07-25 DIAGNOSIS — J439 Emphysema, unspecified: Secondary | ICD-10-CM | POA: Diagnosis not present

## 2017-07-25 DIAGNOSIS — I2510 Atherosclerotic heart disease of native coronary artery without angina pectoris: Secondary | ICD-10-CM | POA: Diagnosis not present

## 2017-08-07 DIAGNOSIS — F0390 Unspecified dementia without behavioral disturbance: Secondary | ICD-10-CM | POA: Diagnosis not present

## 2017-08-07 DIAGNOSIS — E1140 Type 2 diabetes mellitus with diabetic neuropathy, unspecified: Secondary | ICD-10-CM | POA: Diagnosis not present

## 2017-08-07 DIAGNOSIS — I480 Paroxysmal atrial fibrillation: Secondary | ICD-10-CM | POA: Diagnosis not present

## 2017-08-07 DIAGNOSIS — M47816 Spondylosis without myelopathy or radiculopathy, lumbar region: Secondary | ICD-10-CM | POA: Diagnosis not present

## 2017-08-07 DIAGNOSIS — I5030 Unspecified diastolic (congestive) heart failure: Secondary | ICD-10-CM | POA: Diagnosis not present

## 2017-08-07 DIAGNOSIS — R54 Age-related physical debility: Secondary | ICD-10-CM | POA: Diagnosis not present

## 2017-08-07 DIAGNOSIS — I441 Atrioventricular block, second degree: Secondary | ICD-10-CM | POA: Diagnosis not present

## 2017-08-07 DIAGNOSIS — I10 Essential (primary) hypertension: Secondary | ICD-10-CM | POA: Diagnosis not present

## 2017-08-07 DIAGNOSIS — J439 Emphysema, unspecified: Secondary | ICD-10-CM | POA: Diagnosis not present

## 2017-08-17 DIAGNOSIS — I5031 Acute diastolic (congestive) heart failure: Secondary | ICD-10-CM | POA: Diagnosis not present

## 2017-08-17 DIAGNOSIS — E119 Type 2 diabetes mellitus without complications: Secondary | ICD-10-CM | POA: Diagnosis not present

## 2017-08-17 DIAGNOSIS — J449 Chronic obstructive pulmonary disease, unspecified: Secondary | ICD-10-CM | POA: Diagnosis not present

## 2017-08-25 DIAGNOSIS — G3184 Mild cognitive impairment, so stated: Secondary | ICD-10-CM | POA: Diagnosis not present

## 2017-08-25 DIAGNOSIS — I5033 Acute on chronic diastolic (congestive) heart failure: Secondary | ICD-10-CM | POA: Diagnosis not present

## 2017-08-25 DIAGNOSIS — J439 Emphysema, unspecified: Secondary | ICD-10-CM | POA: Diagnosis not present

## 2017-08-25 DIAGNOSIS — I2510 Atherosclerotic heart disease of native coronary artery without angina pectoris: Secondary | ICD-10-CM | POA: Diagnosis not present

## 2017-09-16 DIAGNOSIS — J449 Chronic obstructive pulmonary disease, unspecified: Secondary | ICD-10-CM | POA: Diagnosis not present

## 2017-09-16 DIAGNOSIS — E119 Type 2 diabetes mellitus without complications: Secondary | ICD-10-CM | POA: Diagnosis not present

## 2017-09-16 DIAGNOSIS — I5031 Acute diastolic (congestive) heart failure: Secondary | ICD-10-CM | POA: Diagnosis not present

## 2017-09-24 DIAGNOSIS — I2510 Atherosclerotic heart disease of native coronary artery without angina pectoris: Secondary | ICD-10-CM | POA: Diagnosis not present

## 2017-09-24 DIAGNOSIS — I5033 Acute on chronic diastolic (congestive) heart failure: Secondary | ICD-10-CM | POA: Diagnosis not present

## 2017-09-24 DIAGNOSIS — G3184 Mild cognitive impairment, so stated: Secondary | ICD-10-CM | POA: Diagnosis not present

## 2017-09-24 DIAGNOSIS — J439 Emphysema, unspecified: Secondary | ICD-10-CM | POA: Diagnosis not present

## 2017-10-17 DIAGNOSIS — J449 Chronic obstructive pulmonary disease, unspecified: Secondary | ICD-10-CM | POA: Diagnosis not present

## 2017-10-17 DIAGNOSIS — E119 Type 2 diabetes mellitus without complications: Secondary | ICD-10-CM | POA: Diagnosis not present

## 2017-10-17 DIAGNOSIS — I5031 Acute diastolic (congestive) heart failure: Secondary | ICD-10-CM | POA: Diagnosis not present

## 2017-10-25 DIAGNOSIS — G3184 Mild cognitive impairment, so stated: Secondary | ICD-10-CM | POA: Diagnosis not present

## 2017-10-25 DIAGNOSIS — I2510 Atherosclerotic heart disease of native coronary artery without angina pectoris: Secondary | ICD-10-CM | POA: Diagnosis not present

## 2017-10-25 DIAGNOSIS — I5033 Acute on chronic diastolic (congestive) heart failure: Secondary | ICD-10-CM | POA: Diagnosis not present

## 2017-10-25 DIAGNOSIS — J439 Emphysema, unspecified: Secondary | ICD-10-CM | POA: Diagnosis not present

## 2017-11-16 DIAGNOSIS — J449 Chronic obstructive pulmonary disease, unspecified: Secondary | ICD-10-CM | POA: Diagnosis not present

## 2017-11-16 DIAGNOSIS — E119 Type 2 diabetes mellitus without complications: Secondary | ICD-10-CM | POA: Diagnosis not present

## 2017-11-16 DIAGNOSIS — I5031 Acute diastolic (congestive) heart failure: Secondary | ICD-10-CM | POA: Diagnosis not present

## 2017-11-24 DIAGNOSIS — G3184 Mild cognitive impairment, so stated: Secondary | ICD-10-CM | POA: Diagnosis not present

## 2017-11-24 DIAGNOSIS — I5033 Acute on chronic diastolic (congestive) heart failure: Secondary | ICD-10-CM | POA: Diagnosis not present

## 2017-11-24 DIAGNOSIS — J439 Emphysema, unspecified: Secondary | ICD-10-CM | POA: Diagnosis not present

## 2017-11-24 DIAGNOSIS — I2510 Atherosclerotic heart disease of native coronary artery without angina pectoris: Secondary | ICD-10-CM | POA: Diagnosis not present

## 2017-11-25 DIAGNOSIS — I441 Atrioventricular block, second degree: Secondary | ICD-10-CM | POA: Diagnosis not present

## 2017-11-25 DIAGNOSIS — Z45018 Encounter for adjustment and management of other part of cardiac pacemaker: Secondary | ICD-10-CM | POA: Diagnosis not present

## 2017-12-17 DIAGNOSIS — I5031 Acute diastolic (congestive) heart failure: Secondary | ICD-10-CM | POA: Diagnosis not present

## 2017-12-17 DIAGNOSIS — E119 Type 2 diabetes mellitus without complications: Secondary | ICD-10-CM | POA: Diagnosis not present

## 2017-12-17 DIAGNOSIS — J449 Chronic obstructive pulmonary disease, unspecified: Secondary | ICD-10-CM | POA: Diagnosis not present

## 2017-12-25 DIAGNOSIS — J439 Emphysema, unspecified: Secondary | ICD-10-CM | POA: Diagnosis not present

## 2017-12-25 DIAGNOSIS — I2510 Atherosclerotic heart disease of native coronary artery without angina pectoris: Secondary | ICD-10-CM | POA: Diagnosis not present

## 2017-12-25 DIAGNOSIS — I5033 Acute on chronic diastolic (congestive) heart failure: Secondary | ICD-10-CM | POA: Diagnosis not present

## 2017-12-25 DIAGNOSIS — G3184 Mild cognitive impairment, so stated: Secondary | ICD-10-CM | POA: Diagnosis not present

## 2018-01-02 DIAGNOSIS — Z794 Long term (current) use of insulin: Secondary | ICD-10-CM | POA: Diagnosis not present

## 2018-01-02 DIAGNOSIS — R5383 Other fatigue: Secondary | ICD-10-CM | POA: Diagnosis not present

## 2018-01-02 DIAGNOSIS — R1310 Dysphagia, unspecified: Secondary | ICD-10-CM | POA: Diagnosis not present

## 2018-01-02 DIAGNOSIS — I5033 Acute on chronic diastolic (congestive) heart failure: Secondary | ICD-10-CM | POA: Diagnosis not present

## 2018-01-02 DIAGNOSIS — E113413 Type 2 diabetes mellitus with severe nonproliferative diabetic retinopathy with macular edema, bilateral: Secondary | ICD-10-CM | POA: Diagnosis not present

## 2018-01-06 DIAGNOSIS — I272 Pulmonary hypertension, unspecified: Secondary | ICD-10-CM | POA: Diagnosis not present

## 2018-01-06 DIAGNOSIS — E1122 Type 2 diabetes mellitus with diabetic chronic kidney disease: Secondary | ICD-10-CM | POA: Diagnosis not present

## 2018-01-06 DIAGNOSIS — N184 Chronic kidney disease, stage 4 (severe): Secondary | ICD-10-CM | POA: Diagnosis not present

## 2018-01-06 DIAGNOSIS — J811 Chronic pulmonary edema: Secondary | ICD-10-CM | POA: Diagnosis not present

## 2018-01-06 DIAGNOSIS — E1140 Type 2 diabetes mellitus with diabetic neuropathy, unspecified: Secondary | ICD-10-CM | POA: Diagnosis not present

## 2018-01-06 DIAGNOSIS — F0390 Unspecified dementia without behavioral disturbance: Secondary | ICD-10-CM | POA: Diagnosis not present

## 2018-01-06 DIAGNOSIS — J439 Emphysema, unspecified: Secondary | ICD-10-CM | POA: Diagnosis not present

## 2018-01-06 DIAGNOSIS — E8770 Fluid overload, unspecified: Secondary | ICD-10-CM | POA: Diagnosis not present

## 2018-01-06 DIAGNOSIS — R600 Localized edema: Secondary | ICD-10-CM | POA: Diagnosis not present

## 2018-01-06 DIAGNOSIS — R0602 Shortness of breath: Secondary | ICD-10-CM | POA: Diagnosis not present

## 2018-01-06 DIAGNOSIS — I130 Hypertensive heart and chronic kidney disease with heart failure and stage 1 through stage 4 chronic kidney disease, or unspecified chronic kidney disease: Secondary | ICD-10-CM | POA: Diagnosis not present

## 2018-01-06 DIAGNOSIS — E1142 Type 2 diabetes mellitus with diabetic polyneuropathy: Secondary | ICD-10-CM | POA: Diagnosis not present

## 2018-01-06 DIAGNOSIS — Z66 Do not resuscitate: Secondary | ICD-10-CM | POA: Diagnosis not present

## 2018-01-06 DIAGNOSIS — Z794 Long term (current) use of insulin: Secondary | ICD-10-CM | POA: Diagnosis not present

## 2018-01-06 DIAGNOSIS — I480 Paroxysmal atrial fibrillation: Secondary | ICD-10-CM | POA: Diagnosis not present

## 2018-01-06 DIAGNOSIS — I5033 Acute on chronic diastolic (congestive) heart failure: Secondary | ICD-10-CM | POA: Diagnosis not present

## 2018-01-06 DIAGNOSIS — E11649 Type 2 diabetes mellitus with hypoglycemia without coma: Secondary | ICD-10-CM | POA: Diagnosis not present

## 2018-01-17 DIAGNOSIS — J449 Chronic obstructive pulmonary disease, unspecified: Secondary | ICD-10-CM | POA: Diagnosis not present

## 2018-01-17 DIAGNOSIS — I5032 Chronic diastolic (congestive) heart failure: Secondary | ICD-10-CM | POA: Diagnosis not present

## 2018-01-17 DIAGNOSIS — F0390 Unspecified dementia without behavioral disturbance: Secondary | ICD-10-CM | POA: Diagnosis not present

## 2018-01-17 DIAGNOSIS — E1122 Type 2 diabetes mellitus with diabetic chronic kidney disease: Secondary | ICD-10-CM | POA: Diagnosis not present

## 2018-01-17 DIAGNOSIS — I130 Hypertensive heart and chronic kidney disease with heart failure and stage 1 through stage 4 chronic kidney disease, or unspecified chronic kidney disease: Secondary | ICD-10-CM | POA: Diagnosis not present

## 2018-01-17 DIAGNOSIS — J439 Emphysema, unspecified: Secondary | ICD-10-CM | POA: Diagnosis not present

## 2018-01-17 DIAGNOSIS — I272 Pulmonary hypertension, unspecified: Secondary | ICD-10-CM | POA: Diagnosis not present

## 2018-01-17 DIAGNOSIS — I2510 Atherosclerotic heart disease of native coronary artery without angina pectoris: Secondary | ICD-10-CM | POA: Diagnosis not present

## 2018-01-17 DIAGNOSIS — E119 Type 2 diabetes mellitus without complications: Secondary | ICD-10-CM | POA: Diagnosis not present

## 2018-01-17 DIAGNOSIS — I5031 Acute diastolic (congestive) heart failure: Secondary | ICD-10-CM | POA: Diagnosis not present

## 2018-01-17 DIAGNOSIS — N184 Chronic kidney disease, stage 4 (severe): Secondary | ICD-10-CM | POA: Diagnosis not present

## 2018-01-17 DIAGNOSIS — I480 Paroxysmal atrial fibrillation: Secondary | ICD-10-CM | POA: Diagnosis not present

## 2018-01-20 DIAGNOSIS — F0390 Unspecified dementia without behavioral disturbance: Secondary | ICD-10-CM | POA: Diagnosis not present

## 2018-01-20 DIAGNOSIS — E1122 Type 2 diabetes mellitus with diabetic chronic kidney disease: Secondary | ICD-10-CM | POA: Diagnosis not present

## 2018-01-20 DIAGNOSIS — I2510 Atherosclerotic heart disease of native coronary artery without angina pectoris: Secondary | ICD-10-CM | POA: Diagnosis not present

## 2018-01-20 DIAGNOSIS — I130 Hypertensive heart and chronic kidney disease with heart failure and stage 1 through stage 4 chronic kidney disease, or unspecified chronic kidney disease: Secondary | ICD-10-CM | POA: Diagnosis not present

## 2018-01-20 DIAGNOSIS — I5032 Chronic diastolic (congestive) heart failure: Secondary | ICD-10-CM | POA: Diagnosis not present

## 2018-01-20 DIAGNOSIS — J439 Emphysema, unspecified: Secondary | ICD-10-CM | POA: Diagnosis not present

## 2018-01-20 DIAGNOSIS — I480 Paroxysmal atrial fibrillation: Secondary | ICD-10-CM | POA: Diagnosis not present

## 2018-01-20 DIAGNOSIS — N184 Chronic kidney disease, stage 4 (severe): Secondary | ICD-10-CM | POA: Diagnosis not present

## 2018-01-20 DIAGNOSIS — I272 Pulmonary hypertension, unspecified: Secondary | ICD-10-CM | POA: Diagnosis not present

## 2018-01-22 DIAGNOSIS — I480 Paroxysmal atrial fibrillation: Secondary | ICD-10-CM | POA: Diagnosis not present

## 2018-01-22 DIAGNOSIS — I5032 Chronic diastolic (congestive) heart failure: Secondary | ICD-10-CM | POA: Diagnosis not present

## 2018-01-22 DIAGNOSIS — I272 Pulmonary hypertension, unspecified: Secondary | ICD-10-CM | POA: Diagnosis not present

## 2018-01-22 DIAGNOSIS — E1122 Type 2 diabetes mellitus with diabetic chronic kidney disease: Secondary | ICD-10-CM | POA: Diagnosis not present

## 2018-01-22 DIAGNOSIS — I2510 Atherosclerotic heart disease of native coronary artery without angina pectoris: Secondary | ICD-10-CM | POA: Diagnosis not present

## 2018-01-22 DIAGNOSIS — I130 Hypertensive heart and chronic kidney disease with heart failure and stage 1 through stage 4 chronic kidney disease, or unspecified chronic kidney disease: Secondary | ICD-10-CM | POA: Diagnosis not present

## 2018-01-22 DIAGNOSIS — F0390 Unspecified dementia without behavioral disturbance: Secondary | ICD-10-CM | POA: Diagnosis not present

## 2018-01-22 DIAGNOSIS — J439 Emphysema, unspecified: Secondary | ICD-10-CM | POA: Diagnosis not present

## 2018-01-22 DIAGNOSIS — N184 Chronic kidney disease, stage 4 (severe): Secondary | ICD-10-CM | POA: Diagnosis not present

## 2018-01-23 DIAGNOSIS — I2510 Atherosclerotic heart disease of native coronary artery without angina pectoris: Secondary | ICD-10-CM | POA: Diagnosis not present

## 2018-01-23 DIAGNOSIS — I130 Hypertensive heart and chronic kidney disease with heart failure and stage 1 through stage 4 chronic kidney disease, or unspecified chronic kidney disease: Secondary | ICD-10-CM | POA: Diagnosis not present

## 2018-01-23 DIAGNOSIS — E1122 Type 2 diabetes mellitus with diabetic chronic kidney disease: Secondary | ICD-10-CM | POA: Diagnosis not present

## 2018-01-23 DIAGNOSIS — I5032 Chronic diastolic (congestive) heart failure: Secondary | ICD-10-CM | POA: Diagnosis not present

## 2018-01-23 DIAGNOSIS — N184 Chronic kidney disease, stage 4 (severe): Secondary | ICD-10-CM | POA: Diagnosis not present

## 2018-01-23 DIAGNOSIS — J439 Emphysema, unspecified: Secondary | ICD-10-CM | POA: Diagnosis not present

## 2018-01-23 DIAGNOSIS — I480 Paroxysmal atrial fibrillation: Secondary | ICD-10-CM | POA: Diagnosis not present

## 2018-01-23 DIAGNOSIS — F0390 Unspecified dementia without behavioral disturbance: Secondary | ICD-10-CM | POA: Diagnosis not present

## 2018-01-23 DIAGNOSIS — I272 Pulmonary hypertension, unspecified: Secondary | ICD-10-CM | POA: Diagnosis not present

## 2018-01-25 DIAGNOSIS — J439 Emphysema, unspecified: Secondary | ICD-10-CM | POA: Diagnosis not present

## 2018-01-25 DIAGNOSIS — G3184 Mild cognitive impairment, so stated: Secondary | ICD-10-CM | POA: Diagnosis not present

## 2018-01-25 DIAGNOSIS — I2510 Atherosclerotic heart disease of native coronary artery without angina pectoris: Secondary | ICD-10-CM | POA: Diagnosis not present

## 2018-01-25 DIAGNOSIS — I5033 Acute on chronic diastolic (congestive) heart failure: Secondary | ICD-10-CM | POA: Diagnosis not present

## 2018-01-30 DIAGNOSIS — N184 Chronic kidney disease, stage 4 (severe): Secondary | ICD-10-CM | POA: Diagnosis not present

## 2018-01-30 DIAGNOSIS — I480 Paroxysmal atrial fibrillation: Secondary | ICD-10-CM | POA: Diagnosis not present

## 2018-01-30 DIAGNOSIS — J439 Emphysema, unspecified: Secondary | ICD-10-CM | POA: Diagnosis not present

## 2018-01-30 DIAGNOSIS — E1122 Type 2 diabetes mellitus with diabetic chronic kidney disease: Secondary | ICD-10-CM | POA: Diagnosis not present

## 2018-01-30 DIAGNOSIS — F0390 Unspecified dementia without behavioral disturbance: Secondary | ICD-10-CM | POA: Diagnosis not present

## 2018-01-30 DIAGNOSIS — I272 Pulmonary hypertension, unspecified: Secondary | ICD-10-CM | POA: Diagnosis not present

## 2018-01-30 DIAGNOSIS — I2510 Atherosclerotic heart disease of native coronary artery without angina pectoris: Secondary | ICD-10-CM | POA: Diagnosis not present

## 2018-01-30 DIAGNOSIS — I130 Hypertensive heart and chronic kidney disease with heart failure and stage 1 through stage 4 chronic kidney disease, or unspecified chronic kidney disease: Secondary | ICD-10-CM | POA: Diagnosis not present

## 2018-01-30 DIAGNOSIS — I5032 Chronic diastolic (congestive) heart failure: Secondary | ICD-10-CM | POA: Diagnosis not present

## 2018-01-31 DIAGNOSIS — I130 Hypertensive heart and chronic kidney disease with heart failure and stage 1 through stage 4 chronic kidney disease, or unspecified chronic kidney disease: Secondary | ICD-10-CM | POA: Diagnosis not present

## 2018-01-31 DIAGNOSIS — I480 Paroxysmal atrial fibrillation: Secondary | ICD-10-CM | POA: Diagnosis not present

## 2018-01-31 DIAGNOSIS — N184 Chronic kidney disease, stage 4 (severe): Secondary | ICD-10-CM | POA: Diagnosis not present

## 2018-01-31 DIAGNOSIS — I2510 Atherosclerotic heart disease of native coronary artery without angina pectoris: Secondary | ICD-10-CM | POA: Diagnosis not present

## 2018-01-31 DIAGNOSIS — F0390 Unspecified dementia without behavioral disturbance: Secondary | ICD-10-CM | POA: Diagnosis not present

## 2018-01-31 DIAGNOSIS — E1122 Type 2 diabetes mellitus with diabetic chronic kidney disease: Secondary | ICD-10-CM | POA: Diagnosis not present

## 2018-01-31 DIAGNOSIS — I272 Pulmonary hypertension, unspecified: Secondary | ICD-10-CM | POA: Diagnosis not present

## 2018-01-31 DIAGNOSIS — J439 Emphysema, unspecified: Secondary | ICD-10-CM | POA: Diagnosis not present

## 2018-01-31 DIAGNOSIS — I5032 Chronic diastolic (congestive) heart failure: Secondary | ICD-10-CM | POA: Diagnosis not present

## 2018-02-05 DIAGNOSIS — J439 Emphysema, unspecified: Secondary | ICD-10-CM | POA: Diagnosis not present

## 2018-02-05 DIAGNOSIS — I272 Pulmonary hypertension, unspecified: Secondary | ICD-10-CM | POA: Diagnosis not present

## 2018-02-05 DIAGNOSIS — I5032 Chronic diastolic (congestive) heart failure: Secondary | ICD-10-CM | POA: Diagnosis not present

## 2018-02-05 DIAGNOSIS — N184 Chronic kidney disease, stage 4 (severe): Secondary | ICD-10-CM | POA: Diagnosis not present

## 2018-02-05 DIAGNOSIS — I480 Paroxysmal atrial fibrillation: Secondary | ICD-10-CM | POA: Diagnosis not present

## 2018-02-05 DIAGNOSIS — I130 Hypertensive heart and chronic kidney disease with heart failure and stage 1 through stage 4 chronic kidney disease, or unspecified chronic kidney disease: Secondary | ICD-10-CM | POA: Diagnosis not present

## 2018-02-05 DIAGNOSIS — F0390 Unspecified dementia without behavioral disturbance: Secondary | ICD-10-CM | POA: Diagnosis not present

## 2018-02-05 DIAGNOSIS — I2510 Atherosclerotic heart disease of native coronary artery without angina pectoris: Secondary | ICD-10-CM | POA: Diagnosis not present

## 2018-02-05 DIAGNOSIS — E1122 Type 2 diabetes mellitus with diabetic chronic kidney disease: Secondary | ICD-10-CM | POA: Diagnosis not present

## 2018-02-10 DIAGNOSIS — F0390 Unspecified dementia without behavioral disturbance: Secondary | ICD-10-CM | POA: Diagnosis not present

## 2018-02-10 DIAGNOSIS — I272 Pulmonary hypertension, unspecified: Secondary | ICD-10-CM | POA: Diagnosis not present

## 2018-02-10 DIAGNOSIS — N184 Chronic kidney disease, stage 4 (severe): Secondary | ICD-10-CM | POA: Diagnosis not present

## 2018-02-10 DIAGNOSIS — E1122 Type 2 diabetes mellitus with diabetic chronic kidney disease: Secondary | ICD-10-CM | POA: Diagnosis not present

## 2018-02-10 DIAGNOSIS — J439 Emphysema, unspecified: Secondary | ICD-10-CM | POA: Diagnosis not present

## 2018-02-10 DIAGNOSIS — I5032 Chronic diastolic (congestive) heart failure: Secondary | ICD-10-CM | POA: Diagnosis not present

## 2018-02-10 DIAGNOSIS — I130 Hypertensive heart and chronic kidney disease with heart failure and stage 1 through stage 4 chronic kidney disease, or unspecified chronic kidney disease: Secondary | ICD-10-CM | POA: Diagnosis not present

## 2018-02-10 DIAGNOSIS — I2510 Atherosclerotic heart disease of native coronary artery without angina pectoris: Secondary | ICD-10-CM | POA: Diagnosis not present

## 2018-02-10 DIAGNOSIS — I480 Paroxysmal atrial fibrillation: Secondary | ICD-10-CM | POA: Diagnosis not present

## 2018-02-12 DIAGNOSIS — I272 Pulmonary hypertension, unspecified: Secondary | ICD-10-CM | POA: Diagnosis not present

## 2018-02-12 DIAGNOSIS — F0390 Unspecified dementia without behavioral disturbance: Secondary | ICD-10-CM | POA: Diagnosis not present

## 2018-02-12 DIAGNOSIS — I480 Paroxysmal atrial fibrillation: Secondary | ICD-10-CM | POA: Diagnosis not present

## 2018-02-12 DIAGNOSIS — N184 Chronic kidney disease, stage 4 (severe): Secondary | ICD-10-CM | POA: Diagnosis not present

## 2018-02-12 DIAGNOSIS — E1122 Type 2 diabetes mellitus with diabetic chronic kidney disease: Secondary | ICD-10-CM | POA: Diagnosis not present

## 2018-02-12 DIAGNOSIS — J439 Emphysema, unspecified: Secondary | ICD-10-CM | POA: Diagnosis not present

## 2018-02-12 DIAGNOSIS — I2510 Atherosclerotic heart disease of native coronary artery without angina pectoris: Secondary | ICD-10-CM | POA: Diagnosis not present

## 2018-02-12 DIAGNOSIS — I5032 Chronic diastolic (congestive) heart failure: Secondary | ICD-10-CM | POA: Diagnosis not present

## 2018-02-12 DIAGNOSIS — I130 Hypertensive heart and chronic kidney disease with heart failure and stage 1 through stage 4 chronic kidney disease, or unspecified chronic kidney disease: Secondary | ICD-10-CM | POA: Diagnosis not present

## 2018-02-13 DIAGNOSIS — N184 Chronic kidney disease, stage 4 (severe): Secondary | ICD-10-CM | POA: Diagnosis not present

## 2018-02-13 DIAGNOSIS — I2510 Atherosclerotic heart disease of native coronary artery without angina pectoris: Secondary | ICD-10-CM | POA: Diagnosis not present

## 2018-02-13 DIAGNOSIS — E1122 Type 2 diabetes mellitus with diabetic chronic kidney disease: Secondary | ICD-10-CM | POA: Diagnosis not present

## 2018-02-13 DIAGNOSIS — I272 Pulmonary hypertension, unspecified: Secondary | ICD-10-CM | POA: Diagnosis not present

## 2018-02-13 DIAGNOSIS — I480 Paroxysmal atrial fibrillation: Secondary | ICD-10-CM | POA: Diagnosis not present

## 2018-02-13 DIAGNOSIS — I130 Hypertensive heart and chronic kidney disease with heart failure and stage 1 through stage 4 chronic kidney disease, or unspecified chronic kidney disease: Secondary | ICD-10-CM | POA: Diagnosis not present

## 2018-02-13 DIAGNOSIS — I5032 Chronic diastolic (congestive) heart failure: Secondary | ICD-10-CM | POA: Diagnosis not present

## 2018-02-13 DIAGNOSIS — J439 Emphysema, unspecified: Secondary | ICD-10-CM | POA: Diagnosis not present

## 2018-02-13 DIAGNOSIS — F0390 Unspecified dementia without behavioral disturbance: Secondary | ICD-10-CM | POA: Diagnosis not present

## 2018-02-14 DIAGNOSIS — J439 Emphysema, unspecified: Secondary | ICD-10-CM | POA: Diagnosis not present

## 2018-02-14 DIAGNOSIS — I130 Hypertensive heart and chronic kidney disease with heart failure and stage 1 through stage 4 chronic kidney disease, or unspecified chronic kidney disease: Secondary | ICD-10-CM | POA: Diagnosis not present

## 2018-02-14 DIAGNOSIS — F0390 Unspecified dementia without behavioral disturbance: Secondary | ICD-10-CM | POA: Diagnosis not present

## 2018-02-14 DIAGNOSIS — N184 Chronic kidney disease, stage 4 (severe): Secondary | ICD-10-CM | POA: Diagnosis not present

## 2018-02-14 DIAGNOSIS — E1122 Type 2 diabetes mellitus with diabetic chronic kidney disease: Secondary | ICD-10-CM | POA: Diagnosis not present

## 2018-02-14 DIAGNOSIS — I5032 Chronic diastolic (congestive) heart failure: Secondary | ICD-10-CM | POA: Diagnosis not present

## 2018-02-14 DIAGNOSIS — I2510 Atherosclerotic heart disease of native coronary artery without angina pectoris: Secondary | ICD-10-CM | POA: Diagnosis not present

## 2018-02-14 DIAGNOSIS — I480 Paroxysmal atrial fibrillation: Secondary | ICD-10-CM | POA: Diagnosis not present

## 2018-02-14 DIAGNOSIS — I272 Pulmonary hypertension, unspecified: Secondary | ICD-10-CM | POA: Diagnosis not present

## 2018-02-16 DIAGNOSIS — E119 Type 2 diabetes mellitus without complications: Secondary | ICD-10-CM | POA: Diagnosis not present

## 2018-02-16 DIAGNOSIS — J449 Chronic obstructive pulmonary disease, unspecified: Secondary | ICD-10-CM | POA: Diagnosis not present

## 2018-02-16 DIAGNOSIS — I5031 Acute diastolic (congestive) heart failure: Secondary | ICD-10-CM | POA: Diagnosis not present

## 2018-02-17 DIAGNOSIS — I5032 Chronic diastolic (congestive) heart failure: Secondary | ICD-10-CM | POA: Diagnosis not present

## 2018-02-17 DIAGNOSIS — I2510 Atherosclerotic heart disease of native coronary artery without angina pectoris: Secondary | ICD-10-CM | POA: Diagnosis not present

## 2018-02-17 DIAGNOSIS — E1122 Type 2 diabetes mellitus with diabetic chronic kidney disease: Secondary | ICD-10-CM | POA: Diagnosis not present

## 2018-02-17 DIAGNOSIS — N184 Chronic kidney disease, stage 4 (severe): Secondary | ICD-10-CM | POA: Diagnosis not present

## 2018-02-17 DIAGNOSIS — I130 Hypertensive heart and chronic kidney disease with heart failure and stage 1 through stage 4 chronic kidney disease, or unspecified chronic kidney disease: Secondary | ICD-10-CM | POA: Diagnosis not present

## 2018-02-17 DIAGNOSIS — I480 Paroxysmal atrial fibrillation: Secondary | ICD-10-CM | POA: Diagnosis not present

## 2018-02-17 DIAGNOSIS — I272 Pulmonary hypertension, unspecified: Secondary | ICD-10-CM | POA: Diagnosis not present

## 2018-02-17 DIAGNOSIS — F0390 Unspecified dementia without behavioral disturbance: Secondary | ICD-10-CM | POA: Diagnosis not present

## 2018-02-17 DIAGNOSIS — J439 Emphysema, unspecified: Secondary | ICD-10-CM | POA: Diagnosis not present

## 2018-02-18 DIAGNOSIS — I2510 Atherosclerotic heart disease of native coronary artery without angina pectoris: Secondary | ICD-10-CM | POA: Diagnosis not present

## 2018-02-18 DIAGNOSIS — N184 Chronic kidney disease, stage 4 (severe): Secondary | ICD-10-CM | POA: Diagnosis not present

## 2018-02-18 DIAGNOSIS — F0390 Unspecified dementia without behavioral disturbance: Secondary | ICD-10-CM | POA: Diagnosis not present

## 2018-02-18 DIAGNOSIS — I130 Hypertensive heart and chronic kidney disease with heart failure and stage 1 through stage 4 chronic kidney disease, or unspecified chronic kidney disease: Secondary | ICD-10-CM | POA: Diagnosis not present

## 2018-02-18 DIAGNOSIS — I272 Pulmonary hypertension, unspecified: Secondary | ICD-10-CM | POA: Diagnosis not present

## 2018-02-18 DIAGNOSIS — I5032 Chronic diastolic (congestive) heart failure: Secondary | ICD-10-CM | POA: Diagnosis not present

## 2018-02-18 DIAGNOSIS — I480 Paroxysmal atrial fibrillation: Secondary | ICD-10-CM | POA: Diagnosis not present

## 2018-02-18 DIAGNOSIS — J439 Emphysema, unspecified: Secondary | ICD-10-CM | POA: Diagnosis not present

## 2018-02-18 DIAGNOSIS — E1122 Type 2 diabetes mellitus with diabetic chronic kidney disease: Secondary | ICD-10-CM | POA: Diagnosis not present

## 2018-02-19 DIAGNOSIS — I5032 Chronic diastolic (congestive) heart failure: Secondary | ICD-10-CM | POA: Diagnosis not present

## 2018-02-19 DIAGNOSIS — N184 Chronic kidney disease, stage 4 (severe): Secondary | ICD-10-CM | POA: Diagnosis not present

## 2018-02-19 DIAGNOSIS — E1122 Type 2 diabetes mellitus with diabetic chronic kidney disease: Secondary | ICD-10-CM | POA: Diagnosis not present

## 2018-02-19 DIAGNOSIS — F0390 Unspecified dementia without behavioral disturbance: Secondary | ICD-10-CM | POA: Diagnosis not present

## 2018-02-19 DIAGNOSIS — I130 Hypertensive heart and chronic kidney disease with heart failure and stage 1 through stage 4 chronic kidney disease, or unspecified chronic kidney disease: Secondary | ICD-10-CM | POA: Diagnosis not present

## 2018-02-19 DIAGNOSIS — I272 Pulmonary hypertension, unspecified: Secondary | ICD-10-CM | POA: Diagnosis not present

## 2018-02-19 DIAGNOSIS — I480 Paroxysmal atrial fibrillation: Secondary | ICD-10-CM | POA: Diagnosis not present

## 2018-02-19 DIAGNOSIS — J439 Emphysema, unspecified: Secondary | ICD-10-CM | POA: Diagnosis not present

## 2018-02-19 DIAGNOSIS — I2510 Atherosclerotic heart disease of native coronary artery without angina pectoris: Secondary | ICD-10-CM | POA: Diagnosis not present

## 2018-02-24 DIAGNOSIS — J439 Emphysema, unspecified: Secondary | ICD-10-CM | POA: Diagnosis not present

## 2018-02-24 DIAGNOSIS — I2510 Atherosclerotic heart disease of native coronary artery without angina pectoris: Secondary | ICD-10-CM | POA: Diagnosis not present

## 2018-02-24 DIAGNOSIS — G3184 Mild cognitive impairment, so stated: Secondary | ICD-10-CM | POA: Diagnosis not present

## 2018-02-24 DIAGNOSIS — I5033 Acute on chronic diastolic (congestive) heart failure: Secondary | ICD-10-CM | POA: Diagnosis not present

## 2018-02-26 DIAGNOSIS — N184 Chronic kidney disease, stage 4 (severe): Secondary | ICD-10-CM | POA: Diagnosis not present

## 2018-02-26 DIAGNOSIS — J439 Emphysema, unspecified: Secondary | ICD-10-CM | POA: Diagnosis not present

## 2018-02-26 DIAGNOSIS — E1122 Type 2 diabetes mellitus with diabetic chronic kidney disease: Secondary | ICD-10-CM | POA: Diagnosis not present

## 2018-02-26 DIAGNOSIS — I480 Paroxysmal atrial fibrillation: Secondary | ICD-10-CM | POA: Diagnosis not present

## 2018-02-26 DIAGNOSIS — I272 Pulmonary hypertension, unspecified: Secondary | ICD-10-CM | POA: Diagnosis not present

## 2018-02-26 DIAGNOSIS — I2510 Atherosclerotic heart disease of native coronary artery without angina pectoris: Secondary | ICD-10-CM | POA: Diagnosis not present

## 2018-02-26 DIAGNOSIS — I130 Hypertensive heart and chronic kidney disease with heart failure and stage 1 through stage 4 chronic kidney disease, or unspecified chronic kidney disease: Secondary | ICD-10-CM | POA: Diagnosis not present

## 2018-02-26 DIAGNOSIS — F0390 Unspecified dementia without behavioral disturbance: Secondary | ICD-10-CM | POA: Diagnosis not present

## 2018-02-26 DIAGNOSIS — I5032 Chronic diastolic (congestive) heart failure: Secondary | ICD-10-CM | POA: Diagnosis not present

## 2018-02-27 DIAGNOSIS — I441 Atrioventricular block, second degree: Secondary | ICD-10-CM | POA: Diagnosis not present

## 2018-02-27 DIAGNOSIS — Z45018 Encounter for adjustment and management of other part of cardiac pacemaker: Secondary | ICD-10-CM | POA: Diagnosis not present

## 2018-03-04 DIAGNOSIS — N184 Chronic kidney disease, stage 4 (severe): Secondary | ICD-10-CM | POA: Diagnosis not present

## 2018-03-04 DIAGNOSIS — I272 Pulmonary hypertension, unspecified: Secondary | ICD-10-CM | POA: Diagnosis not present

## 2018-03-04 DIAGNOSIS — I2510 Atherosclerotic heart disease of native coronary artery without angina pectoris: Secondary | ICD-10-CM | POA: Diagnosis not present

## 2018-03-04 DIAGNOSIS — J439 Emphysema, unspecified: Secondary | ICD-10-CM | POA: Diagnosis not present

## 2018-03-04 DIAGNOSIS — I130 Hypertensive heart and chronic kidney disease with heart failure and stage 1 through stage 4 chronic kidney disease, or unspecified chronic kidney disease: Secondary | ICD-10-CM | POA: Diagnosis not present

## 2018-03-04 DIAGNOSIS — I5032 Chronic diastolic (congestive) heart failure: Secondary | ICD-10-CM | POA: Diagnosis not present

## 2018-03-04 DIAGNOSIS — E1122 Type 2 diabetes mellitus with diabetic chronic kidney disease: Secondary | ICD-10-CM | POA: Diagnosis not present

## 2018-03-04 DIAGNOSIS — F0390 Unspecified dementia without behavioral disturbance: Secondary | ICD-10-CM | POA: Diagnosis not present

## 2018-03-04 DIAGNOSIS — I480 Paroxysmal atrial fibrillation: Secondary | ICD-10-CM | POA: Diagnosis not present

## 2018-03-19 DIAGNOSIS — J449 Chronic obstructive pulmonary disease, unspecified: Secondary | ICD-10-CM | POA: Diagnosis not present

## 2018-03-19 DIAGNOSIS — E119 Type 2 diabetes mellitus without complications: Secondary | ICD-10-CM | POA: Diagnosis not present

## 2018-03-19 DIAGNOSIS — I5031 Acute diastolic (congestive) heart failure: Secondary | ICD-10-CM | POA: Diagnosis not present

## 2018-04-03 DIAGNOSIS — Z23 Encounter for immunization: Secondary | ICD-10-CM | POA: Diagnosis not present

## 2018-04-03 DIAGNOSIS — I480 Paroxysmal atrial fibrillation: Secondary | ICD-10-CM | POA: Diagnosis not present

## 2018-04-03 DIAGNOSIS — I5032 Chronic diastolic (congestive) heart failure: Secondary | ICD-10-CM | POA: Diagnosis not present

## 2018-04-03 DIAGNOSIS — N184 Chronic kidney disease, stage 4 (severe): Secondary | ICD-10-CM | POA: Diagnosis not present

## 2018-04-03 DIAGNOSIS — E1140 Type 2 diabetes mellitus with diabetic neuropathy, unspecified: Secondary | ICD-10-CM | POA: Diagnosis not present

## 2018-04-03 DIAGNOSIS — Z794 Long term (current) use of insulin: Secondary | ICD-10-CM | POA: Diagnosis not present

## 2018-04-18 DIAGNOSIS — I5031 Acute diastolic (congestive) heart failure: Secondary | ICD-10-CM | POA: Diagnosis not present

## 2018-04-18 DIAGNOSIS — E119 Type 2 diabetes mellitus without complications: Secondary | ICD-10-CM | POA: Diagnosis not present

## 2018-04-18 DIAGNOSIS — J449 Chronic obstructive pulmonary disease, unspecified: Secondary | ICD-10-CM | POA: Diagnosis not present

## 2018-05-29 DIAGNOSIS — I441 Atrioventricular block, second degree: Secondary | ICD-10-CM | POA: Diagnosis not present

## 2018-05-29 DIAGNOSIS — Z45018 Encounter for adjustment and management of other part of cardiac pacemaker: Secondary | ICD-10-CM | POA: Diagnosis not present

## 2018-08-28 DIAGNOSIS — I441 Atrioventricular block, second degree: Secondary | ICD-10-CM | POA: Diagnosis not present

## 2018-08-28 DIAGNOSIS — Z66 Do not resuscitate: Secondary | ICD-10-CM | POA: Diagnosis not present

## 2018-08-28 DIAGNOSIS — Z45018 Encounter for adjustment and management of other part of cardiac pacemaker: Secondary | ICD-10-CM | POA: Diagnosis not present

## 2018-09-27 ENCOUNTER — Inpatient Hospital Stay
Admission: EM | Admit: 2018-09-27 | Discharge: 2018-10-03 | DRG: 683 | Disposition: A | Discharge status: Home Health | Payer: Medicare HMO | Attending: Internal Medicine | Admitting: Internal Medicine

## 2018-09-27 ENCOUNTER — Emergency Department: Payer: Medicare HMO

## 2018-09-27 ENCOUNTER — Other Ambulatory Visit: Payer: Self-pay

## 2018-09-27 ENCOUNTER — Encounter: Payer: Self-pay | Admitting: Emergency Medicine

## 2018-09-27 DIAGNOSIS — Z8249 Family history of ischemic heart disease and other diseases of the circulatory system: Secondary | ICD-10-CM | POA: Diagnosis not present

## 2018-09-27 DIAGNOSIS — E1165 Type 2 diabetes mellitus with hyperglycemia: Secondary | ICD-10-CM | POA: Diagnosis present

## 2018-09-27 DIAGNOSIS — N390 Urinary tract infection, site not specified: Secondary | ICD-10-CM

## 2018-09-27 DIAGNOSIS — F0390 Unspecified dementia without behavioral disturbance: Secondary | ICD-10-CM | POA: Diagnosis present

## 2018-09-27 DIAGNOSIS — K7581 Nonalcoholic steatohepatitis (NASH): Secondary | ICD-10-CM | POA: Diagnosis present

## 2018-09-27 DIAGNOSIS — K219 Gastro-esophageal reflux disease without esophagitis: Secondary | ICD-10-CM | POA: Diagnosis present

## 2018-09-27 DIAGNOSIS — N186 End stage renal disease: Secondary | ICD-10-CM | POA: Diagnosis not present

## 2018-09-27 DIAGNOSIS — E1122 Type 2 diabetes mellitus with diabetic chronic kidney disease: Secondary | ICD-10-CM | POA: Diagnosis present

## 2018-09-27 DIAGNOSIS — I509 Heart failure, unspecified: Secondary | ICD-10-CM | POA: Diagnosis not present

## 2018-09-27 DIAGNOSIS — Z20828 Contact with and (suspected) exposure to other viral communicable diseases: Secondary | ICD-10-CM | POA: Diagnosis not present

## 2018-09-27 DIAGNOSIS — I248 Other forms of acute ischemic heart disease: Secondary | ICD-10-CM | POA: Diagnosis not present

## 2018-09-27 DIAGNOSIS — Z79899 Other long term (current) drug therapy: Secondary | ICD-10-CM | POA: Diagnosis not present

## 2018-09-27 DIAGNOSIS — Z03818 Encounter for observation for suspected exposure to other biological agents ruled out: Secondary | ICD-10-CM | POA: Diagnosis not present

## 2018-09-27 DIAGNOSIS — I10 Essential (primary) hypertension: Secondary | ICD-10-CM | POA: Diagnosis not present

## 2018-09-27 DIAGNOSIS — Z880 Allergy status to penicillin: Secondary | ICD-10-CM

## 2018-09-27 DIAGNOSIS — N179 Acute kidney failure, unspecified: Secondary | ICD-10-CM

## 2018-09-27 DIAGNOSIS — K5900 Constipation, unspecified: Secondary | ICD-10-CM | POA: Diagnosis present

## 2018-09-27 DIAGNOSIS — R109 Unspecified abdominal pain: Secondary | ICD-10-CM | POA: Diagnosis not present

## 2018-09-27 DIAGNOSIS — G8929 Other chronic pain: Secondary | ICD-10-CM | POA: Diagnosis present

## 2018-09-27 DIAGNOSIS — Z79890 Hormone replacement therapy (postmenopausal): Secondary | ICD-10-CM | POA: Diagnosis not present

## 2018-09-27 DIAGNOSIS — R778 Other specified abnormalities of plasma proteins: Secondary | ICD-10-CM

## 2018-09-27 DIAGNOSIS — I110 Hypertensive heart disease with heart failure: Secondary | ICD-10-CM | POA: Diagnosis not present

## 2018-09-27 DIAGNOSIS — R52 Pain, unspecified: Secondary | ICD-10-CM | POA: Diagnosis not present

## 2018-09-27 DIAGNOSIS — Z7982 Long term (current) use of aspirin: Secondary | ICD-10-CM | POA: Diagnosis not present

## 2018-09-27 DIAGNOSIS — K2970 Gastritis, unspecified, without bleeding: Secondary | ICD-10-CM | POA: Diagnosis present

## 2018-09-27 DIAGNOSIS — E039 Hypothyroidism, unspecified: Secondary | ICD-10-CM | POA: Diagnosis present

## 2018-09-27 DIAGNOSIS — R7989 Other specified abnormal findings of blood chemistry: Secondary | ICD-10-CM | POA: Diagnosis not present

## 2018-09-27 DIAGNOSIS — K2960 Other gastritis without bleeding: Secondary | ICD-10-CM | POA: Diagnosis not present

## 2018-09-27 DIAGNOSIS — I130 Hypertensive heart and chronic kidney disease with heart failure and stage 1 through stage 4 chronic kidney disease, or unspecified chronic kidney disease: Secondary | ICD-10-CM | POA: Diagnosis present

## 2018-09-27 DIAGNOSIS — Z8711 Personal history of peptic ulcer disease: Secondary | ICD-10-CM | POA: Diagnosis not present

## 2018-09-27 DIAGNOSIS — Z809 Family history of malignant neoplasm, unspecified: Secondary | ICD-10-CM | POA: Diagnosis not present

## 2018-09-27 DIAGNOSIS — Z86010 Personal history of colonic polyps: Secondary | ICD-10-CM | POA: Diagnosis not present

## 2018-09-27 DIAGNOSIS — R112 Nausea with vomiting, unspecified: Secondary | ICD-10-CM | POA: Diagnosis not present

## 2018-09-27 DIAGNOSIS — Z825 Family history of asthma and other chronic lower respiratory diseases: Secondary | ICD-10-CM | POA: Diagnosis not present

## 2018-09-27 DIAGNOSIS — N184 Chronic kidney disease, stage 4 (severe): Secondary | ICD-10-CM | POA: Diagnosis not present

## 2018-09-27 DIAGNOSIS — Z794 Long term (current) use of insulin: Secondary | ICD-10-CM

## 2018-09-27 DIAGNOSIS — E119 Type 2 diabetes mellitus without complications: Secondary | ICD-10-CM | POA: Diagnosis not present

## 2018-09-27 DIAGNOSIS — K2950 Unspecified chronic gastritis without bleeding: Secondary | ICD-10-CM | POA: Diagnosis not present

## 2018-09-27 DIAGNOSIS — Z87891 Personal history of nicotine dependence: Secondary | ICD-10-CM

## 2018-09-27 DIAGNOSIS — I5032 Chronic diastolic (congestive) heart failure: Secondary | ICD-10-CM | POA: Diagnosis present

## 2018-09-27 DIAGNOSIS — N189 Chronic kidney disease, unspecified: Secondary | ICD-10-CM

## 2018-09-27 DIAGNOSIS — I120 Hypertensive chronic kidney disease with stage 5 chronic kidney disease or end stage renal disease: Secondary | ICD-10-CM | POA: Diagnosis not present

## 2018-09-27 DIAGNOSIS — M549 Dorsalgia, unspecified: Secondary | ICD-10-CM | POA: Diagnosis present

## 2018-09-27 DIAGNOSIS — R1084 Generalized abdominal pain: Secondary | ICD-10-CM | POA: Diagnosis not present

## 2018-09-27 DIAGNOSIS — R110 Nausea: Secondary | ICD-10-CM | POA: Diagnosis not present

## 2018-09-27 DIAGNOSIS — Z882 Allergy status to sulfonamides status: Secondary | ICD-10-CM

## 2018-09-27 DIAGNOSIS — Z886 Allergy status to analgesic agent status: Secondary | ICD-10-CM

## 2018-09-27 DIAGNOSIS — R1013 Epigastric pain: Secondary | ICD-10-CM | POA: Diagnosis not present

## 2018-09-27 DIAGNOSIS — Z91018 Allergy to other foods: Secondary | ICD-10-CM

## 2018-09-27 LAB — CBC WITH DIFFERENTIAL/PLATELET
Abs Immature Granulocytes: 0.07 10*3/uL (ref 0.00–0.07)
Basophils Absolute: 0.1 10*3/uL (ref 0.0–0.1)
Basophils Relative: 1 %
Eosinophils Absolute: 0 10*3/uL (ref 0.0–0.5)
Eosinophils Relative: 0 %
HCT: 44.5 % (ref 36.0–46.0)
Hemoglobin: 15.4 g/dL — ABNORMAL HIGH (ref 12.0–15.0)
Immature Granulocytes: 1 %
Lymphocytes Relative: 13 %
Lymphs Abs: 1.4 10*3/uL (ref 0.7–4.0)
MCH: 27.1 pg (ref 26.0–34.0)
MCHC: 34.6 g/dL (ref 30.0–36.0)
MCV: 78.2 fL — ABNORMAL LOW (ref 80.0–100.0)
Monocytes Absolute: 0.4 10*3/uL (ref 0.1–1.0)
Monocytes Relative: 4 %
Neutro Abs: 9.3 10*3/uL — ABNORMAL HIGH (ref 1.7–7.7)
Neutrophils Relative %: 81 %
Platelets: 237 10*3/uL (ref 150–400)
RBC: 5.69 MIL/uL — ABNORMAL HIGH (ref 3.87–5.11)
RDW: 14.3 % (ref 11.5–15.5)
WBC: 11.3 10*3/uL — ABNORMAL HIGH (ref 4.0–10.5)
nRBC: 0 % (ref 0.0–0.2)

## 2018-09-27 LAB — URINALYSIS, COMPLETE (UACMP) WITH MICROSCOPIC
Bacteria, UA: NONE SEEN
Bilirubin Urine: NEGATIVE
Glucose, UA: >=500 mg/dL — AB
Ketones, ur: 5 mg/dL — AB
Nitrite: NEGATIVE
Protein, ur: >=300 mg/dL — AB
Specific Gravity, Urine: 1.02 (ref 1.005–1.030)
pH: 7 (ref 5.0–8.0)

## 2018-09-27 LAB — CREATININE, SERUM
Creatinine, Ser: 1.89 mg/dL — ABNORMAL HIGH (ref 0.44–1.00)
GFR calc Af Amer: 30 mL/min — ABNORMAL LOW (ref >60)
GFR calc non Af Amer: 26 mL/min — ABNORMAL LOW (ref >60)

## 2018-09-27 LAB — CBC
HCT: 38.7 % (ref 36.0–46.0)
Hemoglobin: 12.7 g/dL (ref 12.0–15.0)
MCH: 26.6 pg (ref 26.0–34.0)
MCHC: 32.8 g/dL (ref 30.0–36.0)
MCV: 81 fL (ref 80.0–100.0)
Platelets: 207 10*3/uL (ref 150–400)
RBC: 4.78 MIL/uL (ref 3.87–5.11)
RDW: 14.5 % (ref 11.5–15.5)
WBC: 12.5 10*3/uL — ABNORMAL HIGH (ref 4.0–10.5)
nRBC: 0 % (ref 0.0–0.2)

## 2018-09-27 LAB — COMPREHENSIVE METABOLIC PANEL
ALT: 14 U/L (ref 0–44)
AST: 27 U/L (ref 15–41)
Albumin: 4.2 g/dL (ref 3.5–5.0)
Alkaline Phosphatase: 172 U/L — ABNORMAL HIGH (ref 38–126)
Anion gap: 13 (ref 5–15)
BUN: 31 mg/dL — ABNORMAL HIGH (ref 8–23)
CO2: 25 mmol/L (ref 22–32)
Calcium: 10.1 mg/dL (ref 8.9–10.3)
Chloride: 103 mmol/L (ref 98–111)
Creatinine, Ser: 1.89 mg/dL — ABNORMAL HIGH (ref 0.44–1.00)
GFR calc Af Amer: 30 mL/min — ABNORMAL LOW (ref >60)
GFR calc non Af Amer: 26 mL/min — ABNORMAL LOW (ref >60)
Glucose, Bld: 302 mg/dL — ABNORMAL HIGH (ref 70–99)
Potassium: 3.4 mmol/L — ABNORMAL LOW (ref 3.5–5.1)
Sodium: 141 mmol/L (ref 135–145)
Total Bilirubin: 0.9 mg/dL (ref 0.3–1.2)
Total Protein: 8.1 g/dL (ref 6.5–8.1)

## 2018-09-27 LAB — GLUCOSE, CAPILLARY
Glucose-Capillary: 316 mg/dL — ABNORMAL HIGH (ref 70–99)
Glucose-Capillary: 215 mg/dL — ABNORMAL HIGH (ref 70–99)
Glucose-Capillary: 225 mg/dL — ABNORMAL HIGH (ref 70–99)
Glucose-Capillary: 211 mg/dL — ABNORMAL HIGH (ref 70–99)

## 2018-09-27 LAB — SARS CORONAVIRUS 2 BY RT PCR (HOSPITAL ORDER, PERFORMED IN ~~LOC~~ HOSPITAL LAB): SARS Coronavirus 2: NEGATIVE

## 2018-09-27 LAB — TROPONIN I
Troponin I: 0.11 ng/mL (ref <0.03)
Troponin I: 0.19 ng/mL (ref <0.03)
Troponin I: 0.17 ng/mL (ref <0.03)
Troponin I: 0.22 ng/mL (ref <0.03)

## 2018-09-27 LAB — LIPASE, BLOOD: Lipase: 25 U/L (ref 11–51)

## 2018-09-27 LAB — LACTIC ACID, PLASMA: Lactic Acid, Venous: 1.6 mmol/L (ref 0.5–1.9)

## 2018-09-27 MED ORDER — NITROGLYCERIN 2 % TD OINT
0.5000 [in_us] | TOPICAL_OINTMENT | Freq: Four times a day (QID) | TRANSDERMAL | Status: DC
  Administered 2018-09-27: 0.5 [in_us] via TOPICAL
  Filled 2018-09-27 (×2): qty 1

## 2018-09-27 MED ORDER — MORPHINE SULFATE (PF) 4 MG/ML IV SOLN
4.0000 mg | Freq: Once | INTRAVENOUS | Status: AC
  Administered 2018-09-27: 4 mg via INTRAVENOUS
  Filled 2018-09-27: qty 1

## 2018-09-27 MED ORDER — CARVEDILOL 25 MG PO TABS
25.0000 mg | ORAL_TABLET | Freq: Two times a day (BID) | ORAL | Status: DC
  Administered 2018-09-27: 25 mg via ORAL
  Filled 2018-09-27 (×2): qty 1

## 2018-09-27 MED ORDER — ONDANSETRON HCL 4 MG PO TABS
4.0000 mg | ORAL_TABLET | Freq: Four times a day (QID) | ORAL | Status: DC | PRN
  Administered 2018-10-01: 4 mg via ORAL
  Filled 2018-09-27: qty 1

## 2018-09-27 MED ORDER — HYDRALAZINE HCL 20 MG/ML IJ SOLN
INTRAMUSCULAR | Status: AC
  Administered 2018-09-27: 10 mg via INTRAVENOUS
  Filled 2018-09-27: qty 1

## 2018-09-27 MED ORDER — AMLODIPINE BESYLATE 10 MG PO TABS
10.0000 mg | ORAL_TABLET | Freq: Every day | ORAL | Status: DC
  Administered 2018-09-27: 10 mg via ORAL
  Filled 2018-09-27 (×2): qty 1

## 2018-09-27 MED ORDER — SODIUM CHLORIDE 0.9 % IV SOLN
1.0000 g | INTRAVENOUS | Status: DC
  Administered 2018-09-28 – 2018-09-29 (×2): 1 g via INTRAVENOUS
  Filled 2018-09-27 (×2): qty 1

## 2018-09-27 MED ORDER — MORPHINE SULFATE (PF) 2 MG/ML IV SOLN: 2.0000 mg | INTRAVENOUS | Status: DC | PRN

## 2018-09-27 MED ORDER — IPRATROPIUM BROMIDE 0.02 % IN SOLN: 0.5000 mg | Freq: Four times a day (QID) | RESPIRATORY_TRACT | Status: DC | PRN

## 2018-09-27 MED ORDER — GABAPENTIN 300 MG PO CAPS
300.0000 mg | ORAL_CAPSULE | Freq: Two times a day (BID) | ORAL | Status: DC
  Administered 2018-09-27 – 2018-10-03 (×9): 300 mg via ORAL
  Filled 2018-09-27 (×11): qty 1

## 2018-09-27 MED ORDER — TRAZODONE HCL 50 MG PO TABS: 50.0000 mg | ORAL_TABLET | Freq: Every evening | ORAL | Status: DC | PRN

## 2018-09-27 MED ORDER — INSULIN GLARGINE 100 UNIT/ML ~~LOC~~ SOLN
30.0000 [IU] | Freq: Every day | SUBCUTANEOUS | Status: DC
  Administered 2018-09-27: 30 [IU] via SUBCUTANEOUS
  Filled 2018-09-27 (×3): qty 0.3

## 2018-09-27 MED ORDER — HYDRALAZINE HCL 20 MG/ML IJ SOLN
10.0000 mg | Freq: Once | INTRAMUSCULAR | Status: AC
  Administered 2018-09-27: 10 mg via INTRAVENOUS

## 2018-09-27 MED ORDER — CLONIDINE HCL 0.1 MG PO TABS
0.3000 mg | ORAL_TABLET | Freq: Two times a day (BID) | ORAL | Status: DC
  Administered 2018-09-27 (×2): 0.3 mg via ORAL
  Filled 2018-09-27 (×3): qty 3

## 2018-09-27 MED ORDER — SODIUM CHLORIDE 0.9% FLUSH: 3.0000 mL | INTRAVENOUS | Status: DC | PRN

## 2018-09-27 MED ORDER — ORAL CARE MOUTH RINSE
15.0000 mL | Freq: Two times a day (BID) | OROMUCOSAL | Status: DC
  Administered 2018-09-27 – 2018-10-03 (×6): 15 mL via OROMUCOSAL

## 2018-09-27 MED ORDER — ALBUTEROL SULFATE (2.5 MG/3ML) 0.083% IN NEBU: 2.5000 mg | INHALATION_SOLUTION | Freq: Four times a day (QID) | RESPIRATORY_TRACT | Status: DC | PRN

## 2018-09-27 MED ORDER — PAROXETINE HCL 20 MG PO TABS
40.0000 mg | ORAL_TABLET | Freq: Every day | ORAL | Status: DC
  Administered 2018-09-28 – 2018-10-03 (×5): 40 mg via ORAL
  Filled 2018-09-27 (×7): qty 2

## 2018-09-27 MED ORDER — NITROGLYCERIN 2 % TD OINT
0.5000 [in_us] | TOPICAL_OINTMENT | Freq: Four times a day (QID) | TRANSDERMAL | Status: DC
  Administered 2018-09-27 – 2018-10-02 (×18): 0.5 [in_us] via TOPICAL
  Filled 2018-09-27 (×18): qty 1

## 2018-09-27 MED ORDER — LABETALOL HCL 5 MG/ML IV SOLN
10.0000 mg | INTRAVENOUS | Status: DC | PRN
  Administered 2018-09-29: 10 mg via INTRAVENOUS
  Filled 2018-09-27: qty 4

## 2018-09-27 MED ORDER — HEPARIN SODIUM (PORCINE) 5000 UNIT/ML IJ SOLN
5000.0000 [IU] | Freq: Three times a day (TID) | INTRAMUSCULAR | Status: DC
  Administered 2018-09-27 – 2018-10-03 (×18): 5000 [IU] via SUBCUTANEOUS
  Filled 2018-09-27 (×18): qty 1

## 2018-09-27 MED ORDER — ONDANSETRON HCL 4 MG/2ML IJ SOLN
4.0000 mg | Freq: Four times a day (QID) | INTRAMUSCULAR | Status: DC | PRN
  Administered 2018-09-29 – 2018-10-02 (×4): 4 mg via INTRAVENOUS
  Filled 2018-09-27 (×4): qty 2

## 2018-09-27 MED ORDER — LOSARTAN POTASSIUM 50 MG PO TABS
50.0000 mg | ORAL_TABLET | Freq: Every day | ORAL | Status: DC
  Administered 2018-09-27: 50 mg via ORAL
  Filled 2018-09-27 (×2): qty 1

## 2018-09-27 MED ORDER — INSULIN ASPART 100 UNIT/ML ~~LOC~~ SOLN
0.0000 [IU] | Freq: Three times a day (TID) | SUBCUTANEOUS | Status: DC
  Administered 2018-09-27: 7 [IU] via SUBCUTANEOUS
  Administered 2018-09-27 – 2018-09-28 (×2): 3 [IU] via SUBCUTANEOUS
  Administered 2018-09-28 – 2018-09-29 (×2): 5 [IU] via SUBCUTANEOUS
  Administered 2018-09-29: 7 [IU] via SUBCUTANEOUS
  Administered 2018-09-29 – 2018-09-30 (×2): 5 [IU] via SUBCUTANEOUS
  Administered 2018-09-30: 7 [IU] via SUBCUTANEOUS
  Administered 2018-09-30: 5 [IU] via SUBCUTANEOUS
  Administered 2018-10-01: 7 [IU] via SUBCUTANEOUS
  Administered 2018-10-01: 3 [IU] via SUBCUTANEOUS
  Administered 2018-10-01 – 2018-10-03 (×3): 2 [IU] via SUBCUTANEOUS
  Administered 2018-10-03: 3 [IU] via SUBCUTANEOUS
  Filled 2018-09-27 (×16): qty 1

## 2018-09-27 MED ORDER — ATORVASTATIN CALCIUM 20 MG PO TABS
80.0000 mg | ORAL_TABLET | Freq: Every day | ORAL | Status: DC
  Administered 2018-09-29 – 2018-10-03 (×4): 80 mg via ORAL
  Filled 2018-09-27 (×6): qty 4

## 2018-09-27 MED ORDER — LABETALOL HCL 5 MG/ML IV SOLN
5.0000 mg | Freq: Once | INTRAVENOUS | Status: AC
  Administered 2018-09-27: 5 mg via INTRAVENOUS
  Filled 2018-09-27: qty 4

## 2018-09-27 MED ORDER — BUMETANIDE 1 MG PO TABS
3.0000 mg | ORAL_TABLET | Freq: Two times a day (BID) | ORAL | Status: DC
  Administered 2018-09-27 – 2018-09-28 (×2): 3 mg via ORAL
  Filled 2018-09-27 (×2): qty 3

## 2018-09-27 MED ORDER — HYDRALAZINE HCL 20 MG/ML IJ SOLN
10.0000 mg | Freq: Four times a day (QID) | INTRAMUSCULAR | Status: DC | PRN
  Administered 2018-09-29 – 2018-09-30 (×4): 10 mg via INTRAVENOUS
  Filled 2018-09-27 (×4): qty 1

## 2018-09-27 MED ORDER — DOCUSATE SODIUM 100 MG PO CAPS
200.0000 mg | ORAL_CAPSULE | Freq: Two times a day (BID) | ORAL | Status: DC
  Administered 2018-09-27 – 2018-10-03 (×4): 200 mg via ORAL
  Filled 2018-09-27 (×11): qty 2

## 2018-09-27 MED ORDER — INSULIN ASPART 100 UNIT/ML ~~LOC~~ SOLN
0.0000 [IU] | Freq: Every day | SUBCUTANEOUS | Status: DC
  Administered 2018-09-27: 2 [IU] via SUBCUTANEOUS
  Administered 2018-09-28: 5 [IU] via SUBCUTANEOUS
  Administered 2018-09-29: 4 [IU] via SUBCUTANEOUS
  Administered 2018-09-30: 5 [IU] via SUBCUTANEOUS
  Administered 2018-10-02: 3 [IU] via SUBCUTANEOUS
  Filled 2018-09-27 (×5): qty 1

## 2018-09-27 MED ORDER — ALUM & MAG HYDROXIDE-SIMETH 200-200-20 MG/5ML PO SUSP
30.0000 mL | Freq: Four times a day (QID) | ORAL | Status: DC
  Administered 2018-09-27 – 2018-09-28 (×4): 30 mL via ORAL
  Filled 2018-09-27 (×5): qty 30

## 2018-09-27 MED ORDER — SODIUM CHLORIDE 0.9% FLUSH
3.0000 mL | Freq: Two times a day (BID) | INTRAVENOUS | Status: DC
  Administered 2018-09-27 – 2018-10-03 (×8): 3 mL via INTRAVENOUS

## 2018-09-27 MED ORDER — POLYETHYLENE GLYCOL 3350 17 G PO PACK
17.0000 g | PACK | Freq: Every day | ORAL | Status: DC
  Administered 2018-09-29 – 2018-10-03 (×3): 17 g via ORAL
  Filled 2018-09-27 (×6): qty 1

## 2018-09-27 MED ORDER — SODIUM CHLORIDE 0.9 % IV SOLN
1.0000 g | INTRAVENOUS | Status: AC
  Administered 2018-09-27: 1 g via INTRAVENOUS
  Filled 2018-09-27: qty 10

## 2018-09-27 MED ORDER — ASPIRIN EC 81 MG PO TBEC
81.0000 mg | DELAYED_RELEASE_TABLET | Freq: Every day | ORAL | Status: DC
  Administered 2018-09-28 – 2018-10-03 (×5): 81 mg via ORAL
  Filled 2018-09-27 (×6): qty 1

## 2018-09-27 MED ORDER — SODIUM CHLORIDE 0.9 % IV SOLN: 250.0000 mL | INTRAVENOUS | Status: DC | PRN

## 2018-09-27 MED ORDER — VITAMIN D3 25 MCG (1000 UNIT) PO TABS
1000.0000 [IU] | ORAL_TABLET | Freq: Every day | ORAL | Status: DC
  Administered 2018-09-28 – 2018-10-03 (×5): 1000 [IU] via ORAL
  Filled 2018-09-27 (×7): qty 1

## 2018-09-27 MED ORDER — NITROGLYCERIN 2 % TD OINT
0.5000 [in_us] | TOPICAL_OINTMENT | Freq: Four times a day (QID) | TRANSDERMAL | Status: DC
  Administered 2018-09-27: 0.5 [in_us] via TOPICAL
  Filled 2018-09-27: qty 1

## 2018-09-27 MED ORDER — VITAMIN B-12 1000 MCG PO TABS
5000.0000 ug | ORAL_TABLET | Freq: Every day | ORAL | Status: DC
  Administered 2018-09-28 – 2018-10-03 (×5): 5000 ug via ORAL
  Filled 2018-09-27 (×5): qty 5

## 2018-09-27 MED ORDER — ONDANSETRON HCL 4 MG/2ML IJ SOLN
4.0000 mg | INTRAMUSCULAR | Status: AC
  Administered 2018-09-27: 4 mg via INTRAVENOUS
  Filled 2018-09-27: qty 2

## 2018-09-27 MED ORDER — LEVOTHYROXINE SODIUM 50 MCG PO TABS
175.0000 ug | ORAL_TABLET | Freq: Every day | ORAL | Status: DC
  Administered 2018-09-28 – 2018-10-01 (×4): 175 ug via ORAL
  Filled 2018-09-27 (×4): qty 1

## 2018-09-27 MED ORDER — ACETAMINOPHEN 325 MG PO TABS: 650.0000 mg | ORAL_TABLET | Freq: Four times a day (QID) | ORAL | Status: DC | PRN

## 2018-09-27 NOTE — ED Triage Notes (Signed)
Patient arrives via EMS for N/V and abdominal pain, says it's been about 7 days. Patient has history of dementia and is a poor historian. Patient also has UTI, husband has been treating with monostat

## 2018-09-27 NOTE — ED Notes (Signed)
Attempted to update husband regarding patient's impending admission, no answer, left voicemail to return call

## 2018-09-27 NOTE — ED Notes (Signed)
Patient transported to CT 

## 2018-09-27 NOTE — H&P (Signed)
[facility] [place] NAME: Andrea [name]    MR#:  [id number]  DATE OF BIRTH:  05-14-1946  DATE OF ADMISSION:  09/27/2018  PRIMARY CARE PHYSICIAN: Aline August, MD   REQUESTING/REFERRING PHYSICIAN: Hinda Kehr, MD  CHIEF COMPLAINT:   Chief Complaint  Patient presents with  . Abdominal Pain  . Nausea    HISTORY OF PRESENT ILLNESS: Andrea [name]  is a 73 y.o. female with a known history of chronic diastolic CHF, chronic back pain, hypertension, hypothyroidism, insulin-dependent diabetes, dementia, [name], and chronic kidney disease who presents with abdominal pain and nausea.  When she initially arrived patient stated that the pain was going on for a week however currently she is unable to tell me how long the pain is been going on for.  She told the ER physician that that is all over her abdomen.  Patient has received IV morphine and unable to provide me any further details.  PAST MEDICAL HISTORY:   Past Medical History:  Diagnosis Date  . CHF (congestive heart failure) ([place])   . Chronic back pain    Followed by pain clinic in [place]  . Endometriosis   . HTN (hypertension)   . Hypothyroidism   . IDDM (insulin dependent diabetes mellitus) ([place])   . Mild dementia ([place])   . NASH (nonalcoholic steatohepatitis)    has had liver biopsy in past  . Obesity   . PUD (peptic ulcer disease) 1990's    PAST SURGICAL HISTORY:  Past Surgical History:  Procedure Laterality Date  . CHOLECYSTECTOMY    . TOTAL ABDOMINAL HYSTERECTOMY W/ BILATERAL SALPINGOOPHORECTOMY      SOCIAL HISTORY:  Social History   Tobacco Use  . Smoking status: Former Research scientist (life sciences)  . Smokeless tobacco: Never Used  . Tobacco comment: Quit 1985  Substance Use Topics  . Alcohol use: No    FAMILY HISTORY:  Family History  Problem Relation Age of Onset  . COPD Sister   . Cancer Brother   . Emphysema Mother   . Heart disease Father     DRUG ALLERGIES:  Allergies   Allergen Reactions  . Watermelon Concentrate Anaphylaxis  . Ace Inhibitors   . Celebrex [Celecoxib] Rash  . Penicillins Rash    Has patient had a PCN reaction causing immediate rash, facial/tongue/throat swelling, SOB or lightheadedness with hypotension: Yes Has patient had a PCN reaction causing severe rash involving mucus membranes or skin necrosis:  Has patient had a PCN reaction that required hospitalization: No Has patient had a PCN reaction occurring within the last 10 years: No If all of the above answers are "NO", then [place] proceed with Cephalosporin use. No  . Sulfa Antibiotics Rash    REVIEW OF SYSTEMS:   CONSTITUTIONAL: Limited due to dementia and pain medication MEDICATIONS AT HOME:  Prior to Admission medications   Medication Sig Start Date End Date Taking? Authorizing Provider  acetaminophen (TYLENOL) 325 MG tablet Take 650 mg by mouth 3 (three) times daily as needed.      [provider]  albuterol (PROVENTIL) (2.5 MG/3ML) 0.083% nebulizer solution Take 2.5 mg by nebulization every 6 (six) hours as needed for wheezing or shortness of breath.    [provider]  amLODipine (NORVASC) 10 MG tablet TAKE ONE TABLET BY MOUTH ONCE DAILY *NEEDS APPOINTMENT FOR FURTHER REFILLS* Patient taking differently: TAKE ONE TABLET BY MOUTH ONCE DAILY 02/23/14   Jackolyn Confer, MD  aspirin 81 MG tablet Take 81  mg by mouth daily.    [provider]  atorvastatin (LIPITOR) 80 MG tablet Take 80 mg by mouth daily.    [provider]  bumetanide (BUMEX) 1 MG tablet Take 3 mg by mouth 2 (two) times daily. Only taken as needed depending on weigh loss per spouse    [provider]  carvedilol (COREG) 25 MG tablet Take 1 tablet by mouth 2 (two) times daily with a meal.  11/30/16   [provider]  chlorthalidone (HYGROTON) 25 MG tablet Take 1 tablet by mouth daily. 02/28/17   [provider]  cholecalciferol (VITAMIN D) 1000 units  tablet Take 1,000 Units by mouth daily.    [provider]  cloNIDine (CATAPRES) 0.3 MG tablet Take 1 tablet by mouth 2 (two) times daily. 02/27/17   [provider]  gabapentin (NEURONTIN) 300 MG capsule Take 600 mg by mouth 2 (two) times daily.    [provider]  insulin aspart (NOVOLOG) 100 UNIT/ML injection Inject 3-15 Units into the skin 3 (three) times daily before meals. Sliding scale Patient taking differently: Inject 0-12 Units into the skin 3 (three) times daily before meals. Sliding scale 10/29/12   Jackolyn Confer, MD  Insulin Glargine (TOUJEO SOLOSTAR) 300 UNIT/ML SOPN Inject 30 Units into the skin daily. Patient taking differently: Inject 70 Units into the skin daily.  01/11/17   Hillary Bow, MD  ipratropium (ATROVENT) 0.02 % nebulizer solution Take 0.5 mg by nebulization every 6 (six) hours as needed for wheezing or shortness of breath.    [provider]  levothyroxine (SYNTHROID, LEVOTHROID) 175 MCG tablet Take 1 tablet by mouth daily. Given one hour prior to any other medications 12/18/16   [provider]  losartan (COZAAR) 50 MG tablet Take 1 tablet by mouth daily. 02/26/17   [provider]  OXYGEN Inhale 1 L into the lungs continuous.    [provider]  PARoxetine (PAXIL) 40 MG tablet Take 1 tablet by mouth daily.  12/13/16   [provider]  traZODone (DESYREL) 50 MG tablet Take 50 mg by mouth at bedtime as needed for sleep.    [provider]  vitamin B-12 (CYANOCOBALAMIN) 1000 MCG tablet Take 5,000 mcg by mouth daily.     [provider]      PHYSICAL EXAMINATION:   VITAL SIGNS: Blood pressure (!) 192/71, pulse 78, temperature 98 F (36.7 C), temperature source Oral, resp. rate 17, height 5\' 1"  (1.549 m), weight 104.3 kg, SpO2 99 %.  GENERAL:  73 y.o.-year-old patient lying in the bed with no acute distress.  EYES: Pupils equal, round, reactive to light and accommodation. No  scleral icterus. Extraocular muscles intact.  HEENT: Head atraumatic, normocephalic. Oropharynx and nasopharynx clear.  NECK:  Supple, no jugular venous distention. No thyroid enlargement, no tenderness.  LUNGS: Normal breath sounds bilaterally, no wheezing, rales,rhonchi or crepitation. No use of accessory muscles of respiration.  CARDIOVASCULAR: S1, S2 normal. No murmurs, rubs, or gallops.  ABDOMEN: Soft, nontender, nondistended. Bowel sounds present. No organomegaly or mass.  EXTREMITIES: No pedal edema, cyanosis, or clubbing.  NEUROLOGIC: Patient unable to provide review of systems PSYCHIATRIC: The patient is alert and oriented x 3.  SKIN: No obvious rash, lesion, or ulcer.   LABORATORY PANEL:   CBC Recent Labs  Lab 09/27/18 0417  WBC 11.3*  HGB 15.4*  HCT 44.5  PLT 237  MCV 78.2*  MCH 27.1  MCHC 34.6  RDW 14.3  LYMPHSABS 1.4  MONOABS 0.4  EOSABS 0.0  BASOSABS 0.1   ------------------------------------------------------------------------------------------------------------------  Chemistries  Recent Labs  Lab 09/27/18 0417  NA 141  K 3.4*  CL 103  CO2 25  GLUCOSE 302*  BUN 31*  CREATININE 1.89*  CALCIUM 10.1  AST 27  ALT 14  ALKPHOS 172*  BILITOT 0.9   ------------------------------------------------------------------------------------------------------------------ estimated creatinine clearance is 29.9 mL/min (A) (by C-G formula based on SCr of 1.89 mg/dL (H)). ------------------------------------------------------------------------------------------------------------------ No results for input(s): TSH, T4TOTAL, T3FREE, THYROIDAB in the last 72 hours.  Invalid input(s): FREET3   Coagulation profile No results for input(s): INR, PROTIME in the last 168 hours. ------------------------------------------------------------------------------------------------------------------- No results for input(s): DDIMER in the last 72  hours. -------------------------------------------------------------------------------------------------------------------  Cardiac Enzymes Recent Labs  Lab 09/27/18 0417  TROPONINI 0.11*   ------------------------------------------------------------------------------------------------------------------ Invalid input(s): POCBNP  ---------------------------------------------------------------------------------------------------------------  Urinalysis    Component Value Date/Time   COLORURINE YELLOW (A) 09/27/2018 0417   APPEARANCEUR HAZY (A) 09/27/2018 0417   APPEARANCEUR Clear 03/08/2014 2035   LABSPEC 1.020 09/27/2018 0417   LABSPEC 1.005 03/08/2014 2035   PHURINE 7.0 09/27/2018 0417   GLUCOSEU >=500 (A) 09/27/2018 0417   GLUCOSEU Negative 03/08/2014 2035   HGBUR SMALL (A) 09/27/2018 0417   BILIRUBINUR NEGATIVE 09/27/2018 0417   BILIRUBINUR Negative 03/08/2014 2035   KETONESUR 5 (A) 09/27/2018 0417   PROTEINUR >=300 (A) 09/27/2018 0417   UROBILINOGEN 0.2 11/21/2012 1601   UROBILINOGEN 0.2 05/19/2011 1041   NITRITE NEGATIVE 09/27/2018 0417   LEUKOCYTESUR TRACE (A) 09/27/2018 0417   LEUKOCYTESUR Negative 03/08/2014 2035     RADIOLOGY: Ct Abdomen Pelvis Wo Contrast  Result Date: 09/27/2018 CLINICAL DATA:  Nausea/vomiting, abdominal pain, UTI. EXAM: CT ABDOMEN AND PELVIS WITHOUT CONTRAST TECHNIQUE: Multidetector CT imaging of the abdomen and pelvis was performed following the standard protocol without IV contrast. COMPARISON:  05/19/2011 FINDINGS: Lower chest: Mosaic attenuation at the lung bases. Hepatobiliary: Nodular hepatic contour, raising the possibility of cirrhosis. Status post cholecystectomy. No intrahepatic or extrahepatic duct dilatation. Pancreas: Within normal limits. Spleen: Normal in size. Adrenals/Urinary Tract: Adrenal glands are within normal limits. Left renal atrophy. Right kidney is within normal limits. No renal, ureteral, or bladder calculi. No  hydronephrosis. Bladder is within normal limits. Stomach/Bowel: Stomach is notable for a tiny hiatal hernia. No evidence of bowel obstruction. Appendix is not discretely visualized and Kawai be surgically absent. Mild rectal wall thickening (series 2/image 77), without inflammatory changes, favoring underdistention. Vascular/Lymphatic: No evidence of abdominal aortic aneurysm. Atherosclerotic calcifications of the abdominal aorta and branch vessels. No suspicious abdominopelvic lymphadenopathy. Reproductive: Status post hysterectomy.  No adnexal masses. Other: No abdominopelvic ascites. Musculoskeletal: Mild degenerative changes of the lower thoracic spine. IMPRESSION: No evidence of bowel obstruction. Appendix is not discretely visualized and Camire be surgically absent. No CT findings to account for the patient's abdominal pain. Possible cirrhosis. Left renal atrophy. Status post cholecystectomy and hysterectomy. Electronically Signed   By: Julian Hy M.D.   On: 09/27/2018 06:10    EKG: Orders placed or performed during the hospital encounter of 09/27/18  . ED EKG  . ED EKG  . EKG 12-Lead  . EKG 12-Lead    IMPRESSION AND PLAN: Patient 74 year old female with history of dementia presenting with abdominal pain  1.  Abdominal pain etiology unclear the scan of the abdomen negative Could be related to accelerated blood pressure treat her blood pressure I will try stool softeners, start patient on schedule Maalox If symptoms persist will consider GI consult  2.  Accelerated hypertension  we will treat patient with IV hydralazine PRN, do IV labetalol PRN nitroglycerin, once blood pressure adequately controlled will DC these medications continue home regimen  3.  Elevated troponin suspect this is due to demand ischemia we will follow troponin levels cardiology consult if troponin trends upward  4.  Chronic diastolic CHF currently compensated continue to monitor  5.  Diabetes type 2 with elevated  blood sugar we will place on sliding scale insulin continue her home insulin   6.  Chronic kidney disease monitor renal function  7.  Dementia monitor mental status       All the records are reviewed and case discussed with ED provider. Management plans discussed with the patient, family and they are in agreement.  CODE STATUS: Code Status History    Date Active Date Inactive Code Status Order ID Comments User Context   03/08/2017 0309 03/09/2017 1455 Full Code [id number]  Vaughan Basta, MD Inpatient   01/05/2017 1629 01/11/2017 1802 Full Code [id number]  Nicholes Mango, MD ED   06/18/2016 0329 06/19/2016 1954 Full Code [id number]  Saundra Shelling, MD Inpatient       TOTAL TIME TAKING CARE OF THIS PATIENT: 64minutes.    Dustin Flock M.D on 09/27/2018 at 7:58 AM  Between 7am to 6pm - Pager - [phone]  After 6pm go to [email or web address] - password [email or web address] [facility]  [phone]  CC: Primary care physician; Aline August, MD

## 2018-09-27 NOTE — ED Notes (Signed)
Placed patient on 2L [place], patient desat into 80s while sleeping. Patient states she wears oxygen at home "sometimes"

## 2018-09-27 NOTE — ED Provider Notes (Signed)
[facility] Emergency Department Provider Note  ____________________________________________   First MD Initiated Contact with Patient 09/27/18 0423     (approximate)  I have reviewed the triage vital signs and the nursing notes.   HISTORY  Chief Complaint Abdominal Pain and Nausea  Level 5 caveat:  history/ROS limited by chronic dementia  HPI Andrea [name] is a 73 y.o. female with medical history as listed below which notably includes at least a degree of dementia, chronic back pain followed by pain clinic, CHF, prior episodes of acute encephalopathy and drug overdose, etc.  She presents by EMS for evaluation of generalized abdominal pain with nausea and vomiting.  She originally said that is been going on for a week but she told me she is not sure how long it has been going on.  She did her stomach frequently hurts but this is worse.  Nothing in particular makes it better or worse.  She said that it hurts all over but it is worse on the right.  She cannot identify the type of pain, neither sharp nor aching or dull.  She says it is severe.  She denies fever/chills, sore throat, shortness of breath, cough, chest pain.  She said that it has been burning when she pees and she thinks she has a urinary tract infection and has been using Monistat to cure the urinary tract infection.         Past Medical History:  Diagnosis Date   CHF (congestive heart failure) (HCC)    Chronic back pain    Followed by pain clinic in [place]   Endometriosis    HTN (hypertension)    Hypothyroidism    IDDM (insulin dependent diabetes mellitus) ([place])    Mild dementia ([place])    NASH (nonalcoholic steatohepatitis)    has had liver biopsy in past   Obesity    PUD (peptic ulcer disease) 1990's    Patient Active Problem List   Diagnosis Date Noted   Drug overdose 03/08/2017   Altered mental status    Encephalopathy acute 01/05/2017   CHF (congestive heart  failure) ([place]) 06/18/2016   GERD (gastroesophageal reflux disease) 03/07/2013   Other and unspecified hyperlipidemia 03/07/2013   Dizziness and giddiness 11/21/2012   Urinary incontinence 09/11/2012   Chronic low back pain 07/30/2012   Anemia 07/30/2012   Noncompliance 07/30/2012   Neuropathy 01/12/2012   Carotid stenosis, right 11/29/2011   Diabetes mellitus type 2, uncontrolled ([place]) 11/29/2011   Hypertension 11/29/2011   Renal artery stenosis ([place]) 06/04/2011   Constipation, chronic 06/04/2011   Hypothyroidism 04/11/2011    Past Surgical History:  Procedure Laterality Date   CHOLECYSTECTOMY     TOTAL ABDOMINAL HYSTERECTOMY W/ BILATERAL SALPINGOOPHORECTOMY      Prior to Admission medications   Medication Sig Start Date End Date Taking? Authorizing Provider  acetaminophen (TYLENOL) 325 MG tablet Take 650 mg by mouth 3 (three) times daily as needed.      [provider]  albuterol (PROVENTIL) (2.5 MG/3ML) 0.083% nebulizer solution Take 2.5 mg by nebulization every 6 (six) hours as needed for wheezing or shortness of breath.    [provider]  amLODipine (NORVASC) 10 MG tablet TAKE ONE TABLET BY MOUTH ONCE DAILY *NEEDS APPOINTMENT FOR FURTHER REFILLS* Patient taking differently: TAKE ONE TABLET BY MOUTH ONCE DAILY 02/23/14   Jackolyn Confer, MD  aspirin 81 MG tablet Take 81 mg by mouth daily.    [provider]  atorvastatin (LIPITOR) 80  MG tablet Take 80 mg by mouth daily.    [provider]  bumetanide (BUMEX) 1 MG tablet Take 3 mg by mouth 2 (two) times daily. Only taken as needed depending on weigh loss per spouse    [provider]  carvedilol (COREG) 25 MG tablet Take 1 tablet by mouth 2 (two) times daily with a meal.  11/30/16   [provider]  chlorthalidone (HYGROTON) 25 MG tablet Take 1 tablet by mouth daily. 02/28/17   [provider]  cholecalciferol (VITAMIN D) 1000 units tablet Take 1,000  Units by mouth daily.    [provider]  cloNIDine (CATAPRES) 0.3 MG tablet Take 1 tablet by mouth 2 (two) times daily. 02/27/17   [provider]  gabapentin (NEURONTIN) 300 MG capsule Take 600 mg by mouth 2 (two) times daily.    [provider]  insulin aspart (NOVOLOG) 100 UNIT/ML injection Inject 3-15 Units into the skin 3 (three) times daily before meals. Sliding scale Patient taking differently: Inject 0-12 Units into the skin 3 (three) times daily before meals. Sliding scale 10/29/12   Jackolyn Confer, MD  Insulin Glargine (TOUJEO SOLOSTAR) 300 UNIT/ML SOPN Inject 30 Units into the skin daily. Patient taking differently: Inject 70 Units into the skin daily.  01/11/17   Hillary Bow, MD  ipratropium (ATROVENT) 0.02 % nebulizer solution Take 0.5 mg by nebulization every 6 (six) hours as needed for wheezing or shortness of breath.    [provider]  levothyroxine (SYNTHROID, LEVOTHROID) 175 MCG tablet Take 1 tablet by mouth daily. Given one hour prior to any other medications 12/18/16   [provider]  losartan (COZAAR) 50 MG tablet Take 1 tablet by mouth daily. 02/26/17   [provider]  OXYGEN Inhale 1 L into the lungs continuous.    [provider]  PARoxetine (PAXIL) 40 MG tablet Take 1 tablet by mouth daily.  12/13/16   [provider]  traZODone (DESYREL) 50 MG tablet Take 50 mg by mouth at bedtime as needed for sleep.    [provider]  vitamin B-12 (CYANOCOBALAMIN) 1000 MCG tablet Take 5,000 mcg by mouth daily.     [provider]    Allergies Watermelon concentrate; Ace inhibitors; Celebrex [celecoxib]; Penicillins; and Sulfa antibiotics  Family History  Problem Relation Age of Onset   COPD Sister    Cancer Brother    Emphysema Mother    Heart disease Father     Social History Social History   Tobacco Use   Smoking status: Former Smoker   Smokeless tobacco: Never Used    Tobacco comment: Quit 1985  Substance Use Topics   Alcohol use: No   Drug use: No    Review of Systems Constitutional: No fever/chills Eyes: No visual changes. ENT: No sore throat. Cardiovascular: Denies chest pain. Respiratory: Denies shortness of breath. Gastrointestinal: Abdominal pain with nausea and vomiting, unclear duration of symptoms. Genitourinary: Negative for dysuria. Musculoskeletal: Negative for neck pain.  Negative for back pain. Integumentary: Negative for rash. Neurological: Negative for headaches, focal weakness or numbness.   ____________________________________________   PHYSICAL EXAM:  VITAL SIGNS: ED Triage Vitals  Enc Vitals Group     BP 09/27/18 0405 (!) 195/107     Pulse Rate 09/27/18 0410 89     Resp 09/27/18 0410 (!) 24     Temp 09/27/18 0411 98 F (36.7 C)     Temp Source 09/27/18 0411 Oral     SpO2 09/27/18  0408 95 %     Weight 09/27/18 0412 104.3 kg (230 lb)     Height 09/27/18 0412 1.549 m (5\' 1" )     Head Circumference --      Peak Flow --      Pain Score --      Pain Loc --      Pain Edu? --      Excl. in [place]? --     Constitutional: Alert and oriented to self and location.  Appears chronically ill and uncomfortable at this time. Eyes: Conjunctivae are normal.  Head: Atraumatic. Nose: No congestion/rhinnorhea. Mouth/Throat: Mucous membranes are moist. Neck: No stridor.  No meningeal signs.   Cardiovascular: Normal rate, regular rhythm. Good peripheral circulation. Grossly normal heart sounds. Respiratory: Normal respiratory effort.  No retractions. No audible wheezing. Gastrointestinal: Obese. Soft with questionable distention.  Generalized tenderness to palpation throughout but with more tenderness to palpation on the right side of the abdomen.  No rebound, some guarding. Musculoskeletal: No lower extremity tenderness nor edema. No gross deformities of extremities. Neurologic:  Normal speech and language. No gross focal  neurologic deficits are appreciated.  Skin:  Skin is warm, dry and intact. No rash noted.   ____________________________________________   LABS (all labs ordered are listed, but only abnormal results are displayed)  Labs Reviewed  URINALYSIS, COMPLETE (UACMP) WITH MICROSCOPIC - Abnormal; Notable for the following components:      Result Value   Color, Urine YELLOW (*)    APPearance HAZY (*)    Glucose, UA >=500 (*)    Hgb urine dipstick SMALL (*)    Ketones, ur 5 (*)    Protein, ur >=300 (*)    Leukocytes,Ua TRACE (*)    All other components within normal limits  CBC WITH DIFFERENTIAL/PLATELET - Abnormal; Notable for the following components:   WBC 11.3 (*)    RBC 5.69 (*)    Hemoglobin 15.4 (*)    MCV 78.2 (*)    Neutro Abs 9.3 (*)    All other components within normal limits  COMPREHENSIVE METABOLIC PANEL - Abnormal; Notable for the following components:   Potassium 3.4 (*)    Glucose, Bld 302 (*)    BUN 31 (*)    Creatinine, Ser 1.89 (*)    Alkaline Phosphatase 172 (*)    GFR calc non Af Amer 26 (*)    GFR calc Af Amer 30 (*)    All other components within normal limits  TROPONIN I - Abnormal; Notable for the following components:   Troponin I 0.11 (*)    All other components within normal limits  SARS CORONAVIRUS 2 (HOSPITAL ORDER, [facility])  URINE CULTURE  LACTIC ACID, PLASMA  LIPASE, BLOOD   ____________________________________________  EKG  ED ECG REPORT I, Hinda Kehr, the attending physician, personally viewed and interpreted this ECG.  Date: 09/27/2018 EKG Time: 4:10 AM Rate: 88 Rhythm and Intervals: Atrial sensed ventricular paced rhythm ST/T Wave abnormalities: Non-specific ST segment / T-wave changes, but no clear evidence of acute ischemia. Narrative Interpretation: Appears similar morphology to a prior EKG from October 2018.  Not indicative of acute  ischemia.   ____________________________________________  RADIOLOGY   ED MD interpretation:  No acute abnormalities on CT abd/pelvis  Official radiology report(s): Ct Abdomen Pelvis Wo Contrast  Result Date: 09/27/2018 CLINICAL DATA:  Nausea/vomiting, abdominal pain, UTI. EXAM: CT ABDOMEN AND PELVIS WITHOUT CONTRAST TECHNIQUE: Multidetector CT imaging of the abdomen and pelvis was performed  following the standard protocol without IV contrast. COMPARISON:  05/19/2011 FINDINGS: Lower chest: Mosaic attenuation at the lung bases. Hepatobiliary: Nodular hepatic contour, raising the possibility of cirrhosis. Status post cholecystectomy. No intrahepatic or extrahepatic duct dilatation. Pancreas: Within normal limits. Spleen: Normal in size. Adrenals/Urinary Tract: Adrenal glands are within normal limits. Left renal atrophy. Right kidney is within normal limits. No renal, ureteral, or bladder calculi. No hydronephrosis. Bladder is within normal limits. Stomach/Bowel: Stomach is notable for a tiny hiatal hernia. No evidence of bowel obstruction. Appendix is not discretely visualized and [place] be surgically absent. Mild rectal wall thickening (series 2/image 77), without inflammatory changes, favoring underdistention. Vascular/Lymphatic: No evidence of abdominal aortic aneurysm. Atherosclerotic calcifications of the abdominal aorta and branch vessels. No suspicious abdominopelvic lymphadenopathy. Reproductive: Status post hysterectomy.  No adnexal masses. Other: No abdominopelvic ascites. Musculoskeletal: Mild degenerative changes of the lower thoracic spine. IMPRESSION: No evidence of bowel obstruction. Appendix is not discretely visualized and [place] be surgically absent. No CT findings to account for the patient's abdominal pain. Possible cirrhosis. Left renal atrophy. Status post cholecystectomy and hysterectomy. Electronically Signed   By: Julian Hy M.D.   On: 09/27/2018 06:10     ____________________________________________   PROCEDURES   Procedure(s) performed (including Critical Care):  Procedures   ____________________________________________   INITIAL IMPRESSION / MDM / [place] / ED COURSE  As part of my medical decision making, I reviewed the following data within the [place] notes reviewed and incorporated, Labs reviewed , EKG interpreted , Old EKG reviewed, Old chart reviewed, Discussed with admitting physician  and Notes from prior ED visits      *Andrea [name] was evaluated in Emergency Department on 09/27/2018 for the symptoms described in the history of present illness. She was evaluated in the context of the global COVID-19 pandemic, which necessitated consideration that the patient might be at risk for infection with the SARS-CoV-2 virus that causes COVID-19. Institutional protocols and algorithms that pertain to the evaluation of patients at risk for COVID-19 are in a state of rapid change based on information released by regulatory bodies including the CDC and federal and state organizations. These policies and algorithms were followed during the patient's care in the ED.*  Differential diagnosis includes, but is not limited to, SBO/ileus, constipation, volvulus, diverticulitis, UTI/pyelonephritis.  Patient is status post cholecystectomy so biliary disease is unlikely.  ACS is also unlikely although still possible.  The patient could also just be having an acute flare of her chronic pain.  She is not able to provide much history.  In and out catheterization has been performed and urine tests are pending.  Lab work is pending.  She has no signs or symptoms concerning for COVID-19 but I am testing her in case she requires hospitalization.  Once her metabolic panel is back I will check a CT scan of her abdomen and pelvis with IV contrast only specifically anticipating the possibility of SBO/ileus.  I will give her  a one-time dose of morphine 4 mg IV and Zofran 4 mg IV and then reassess.  Clinical Course as of [place] 16 0703  Sat Bruck 16, 2020  0530 Lactic Acid, Venous: 1.6 [CF]  [phone] Patient has a normal lactic acid but her CMP indicates a creatinine of about 1.9 with a GFR of only 26.  She cannot have IV contrast at this time so I have ordered a CT scan without contrast (she will not be able to drink  any contrast.  CBC indicates some hemoconcentration with a hemoglobin of 15.4 likely reflective of decreased volume status and a very mild leukocytosis of 11.3.  Her urinalysis is somewhat equivocal but likely indicates acute infection.   [CF]  0973 SARS Coronavirus 2: NEGATIVE [CF]  5329 The patient CT scan is unremarkable with no indication for a cause of her acute pain.  However her troponin is elevated 0.11 and I looked back at a prior result from 2018 where it was normal.  She also has acutely positive urinalysis and I will treat empirically with antibiotics (ceftriaxone 1 g IV).  Urine culture is pending.  I discussed the case with the hospitalist, Dr. Marcille Blanco, who will admit.   [CF]  [phone] Of note, her blood pressure is extremely labile but significantly elevated and consistently around 683 diastolic.  Given her age I will give labetalol 5 mg IV and see how she responds before getting a higher dose.  Hospitalist informed.   [CF]    Clinical Course User Index [CF] Hinda Kehr, MD     ____________________________________________  FINAL CLINICAL IMPRESSION(S) / ED DIAGNOSES  Final diagnoses:  Generalized abdominal pain  Elevated troponin I level  Chronic kidney disease, unspecified CKD stage  Urinary tract infection without hematuria, site unspecified  Uncontrolled hypertension     MEDICATIONS GIVEN DURING THIS VISIT:  Medications  cefTRIAXone (ROCEPHIN) 1 g in sodium chloride 0.9 % 100 mL IVPB (1 g Intravenous New Bag/Given 09/27/18 0700)  morphine 4 MG/ML injection 4 mg (4 mg Intravenous  Given 09/27/18 0503)  ondansetron (ZOFRAN) injection 4 mg (4 mg Intravenous Given 09/27/18 0503)  labetalol (NORMODYNE) injection 5 mg (5 mg Intravenous Given 09/27/18 4196)     ED Discharge Orders    None       Note:  This document was prepared using Dragon voice recognition software and [place] include unintentional dictation errors.   Hinda Kehr, MD 09/27/18 [phone]

## 2018-09-27 NOTE — Progress Notes (Signed)
Message Ddr. Posey Pronto regarding gabapentin dosing with CrCl ~24. He approved change of order from gabapentin 600mg  bid to 300mg  bid.

## 2018-09-27 NOTE — ED Notes (Signed)
ED TO INPATIENT HANDOFF REPORT  ED Nurse Name and Phone #:  Berenda Morale RN [phone]  S Name/Age/Gender Andrea [name] 73 y.o. female Room/Bed: ED04A/ED04A  Code Status   Code Status: Prior  Home/SNF/Other Home Patient oriented to: self Is this baseline? Yes   Triage Complete: Triage complete  Chief Complaint [facility] Abd Pain  Triage Note Patient arrives via EMS for N/V and abdominal pain, says it's been about 7 days. Patient has history of dementia and is a poor historian. Patient also has UTI, husband has been treating with monostat   Allergies Allergies  Allergen Reactions  . Watermelon Concentrate Anaphylaxis  . Ace Inhibitors   . Celebrex [Celecoxib] Rash  . Penicillins Rash    Has patient had a PCN reaction causing immediate rash, facial/tongue/throat swelling, SOB or lightheadedness with hypotension: Yes Has patient had a PCN reaction causing severe rash involving mucus membranes or skin necrosis:  Has patient had a PCN reaction that required hospitalization: No Has patient had a PCN reaction occurring within the last 10 years: No If all of the above answers are "NO", then [place] proceed with Cephalosporin use. No  . Sulfa Antibiotics Rash    Level of Care/Admitting Diagnosis ED Disposition    ED Disposition Condition [facility] Area: [place] [[id number]]  Level of Care: Telemetry [5]  Covid Evaluation: N/A  Diagnosis: Abdominal pain [[id number]]  Admitting Physician: Dustin Flock [[id number]]  Attending Physician: Dustin Flock [[id number]]  Estimated length of stay: past midnight tomorrow  Certification:: I certify this patient will need inpatient services for at least 2 midnights  PT Class (Do Not Modify): Inpatient [101]  PT Acc Code (Do Not Modify): Private [1]       B Medical/Surgery History Past Medical History:  Diagnosis Date  . CHF (congestive heart failure) ([place])   . Chronic back pain    Followed by pain  clinic in [place]  . Endometriosis   . HTN (hypertension)   . Hypothyroidism   . IDDM (insulin dependent diabetes mellitus) ([place])   . Mild dementia ([place])   . NASH (nonalcoholic steatohepatitis)    has had liver biopsy in past  . Obesity   . PUD (peptic ulcer disease) 1990's   Past Surgical History:  Procedure Laterality Date  . CHOLECYSTECTOMY    . TOTAL ABDOMINAL HYSTERECTOMY W/ BILATERAL SALPINGOOPHORECTOMY       A IV Location/Drains/Wounds Patient Lines/Drains/Airways Status   Active Line/Drains/Airways    Name:   Placement date:   Placement time:   Site:   Days:   Peripheral IV 09/27/18 Left Antecubital   09/27/18    0459    Antecubital   less than 1          Intake/Output Last 24 hours  Intake/Output Summary (Last 24 hours) at 09/27/2018 3716 Last data filed at 09/27/2018 0747 Gross per 24 hour  Intake 100 ml  Output -  Net 100 ml    Labs/Imaging Results for orders placed or performed during the hospital encounter of 09/27/18 (from the past 48 hour(s))  Urinalysis, Complete w Microscopic     Status: Abnormal   Collection Time: 09/27/18  4:17 AM  Result Value Ref Range   Color, Urine YELLOW (A) YELLOW   APPearance HAZY (A) CLEAR   Specific Gravity, Urine 1.020 1.005 - 1.030   pH 7.0 5.0 - 8.0   Glucose, UA >=500 (A) NEGATIVE mg/dL   Hgb urine dipstick SMALL (A) NEGATIVE  Bilirubin Urine NEGATIVE NEGATIVE   Ketones, ur 5 (A) NEGATIVE mg/dL   Protein, ur >=300 (A) NEGATIVE mg/dL   Nitrite NEGATIVE NEGATIVE   Leukocytes,Ua TRACE (A) NEGATIVE   RBC / HPF 6-10 0 - 5 RBC/hpf   WBC, UA 21-50 0 - 5 WBC/hpf   Bacteria, UA NONE SEEN NONE SEEN   Squamous Epithelial / LPF 0-5 0 - 5   Mucus PRESENT     Comment: Performed at [facility], [address] [place], [place] [address]  CBC with Differential/Platelet     Status: Abnormal   Collection Time: 09/27/18  4:17 AM  Result Value Ref Range   WBC 11.3 (H) 4.0 - 10.5 K/uL   RBC 5.69 (H) 3.87 - 5.11  MIL/uL   Hemoglobin 15.4 (H) 12.0 - 15.0 g/dL   HCT 44.5 36.0 - 46.0 %   MCV 78.2 (L) 80.0 - 100.0 fL   MCH 27.1 26.0 - 34.0 pg   MCHC 34.6 30.0 - 36.0 g/dL   RDW 14.3 11.5 - 15.5 %   Platelets 237 150 - 400 K/uL   nRBC 0.0 0.0 - 0.2 %   Neutrophils Relative % 81 %   Neutro Abs 9.3 (H) 1.7 - 7.7 K/uL   Lymphocytes Relative 13 %   Lymphs Abs 1.4 0.7 - 4.0 K/uL   Monocytes Relative 4 %   Monocytes Absolute 0.4 0.1 - 1.0 K/uL   Eosinophils Relative 0 %   Eosinophils Absolute 0.0 0.0 - 0.5 K/uL   Basophils Relative 1 %   Basophils Absolute 0.1 0.0 - 0.1 K/uL   Immature Granulocytes 1 %   Abs Immature Granulocytes 0.07 0.00 - 0.07 K/uL    Comment: Performed at [facility], [address] [place], [place] [address]  Comprehensive metabolic panel     Status: Abnormal   Collection Time: 09/27/18  4:17 AM  Result Value Ref Range   Sodium 141 135 - 145 mmol/L   Potassium 3.4 (L) 3.5 - 5.1 mmol/L   Chloride 103 98 - 111 mmol/L   CO2 25 22 - 32 mmol/L   Glucose, Bld 302 (H) 70 - 99 mg/dL   BUN 31 (H) 8 - 23 mg/dL   Creatinine, Ser 1.89 (H) 0.44 - 1.00 mg/dL   Calcium 10.1 8.9 - 10.3 mg/dL   Total Protein 8.1 6.5 - 8.1 g/dL   Albumin 4.2 3.5 - 5.0 g/dL   AST 27 15 - 41 U/L   ALT 14 0 - 44 U/L   Alkaline Phosphatase 172 (H) 38 - 126 U/L   Total Bilirubin 0.9 0.3 - 1.2 mg/dL   GFR calc non Af Amer 26 (L) >60 mL/min   GFR calc Af Amer 30 (L) >60 mL/min   Anion gap 13 5 - 15    Comment: Performed at [facility], [address] [place], [place] [address]  Lipase, blood     Status: None   Collection Time: 09/27/18  4:17 AM  Result Value Ref Range   Lipase 25 11 - 51 U/L    Comment: Performed at [facility], [address] [place],  [address]  Troponin I - ONCE - STAT     Status: Abnormal   Collection Time: 09/27/18  4:17 AM  Result Value Ref Range   Troponin I 0.11 (HH) <0.03 ng/mL    Comment: CRITICAL RESULT CALLED TO, READ BACK BY AND  VERIFIED WITH KATE BUCKNUM AT [place] ON 09/27/18 RWW Performed at  [facility], [address] [place], [place] [address]   SARS Coronavirus 2 (CEPHEID - Performed in [facility] hospital lab), Hosp Order     Status: None   Collection Time: 09/27/18  4:43 AM  Result Value Ref Range   SARS Coronavirus 2 NEGATIVE NEGATIVE    Comment: (NOTE) If result is NEGATIVE SARS-CoV-2 target nucleic acids are NOT DETECTED. The SARS-CoV-2 RNA is generally detectable in upper and lower  respiratory specimens during the acute phase of infection. The lowest  concentration of SARS-CoV-2 viral copies this assay can detect is 250  copies / mL. A negative result does not preclude SARS-CoV-2 infection  and should not be used as the sole basis for treatment or other  patient management decisions.  A negative result Bialy occur with  improper specimen collection / handling, submission of specimen other  than nasopharyngeal swab, presence of viral mutation(s) within the  areas targeted by this assay, and inadequate number of viral copies  (<250 copies / mL). A negative result must be combined with clinical  observations, patient history, and epidemiological information. If result is POSITIVE SARS-CoV-2 target nucleic acids are DETECTED. The SARS-CoV-2 RNA is generally detectable in upper and lower  respiratory specimens dur ing the acute phase of infection.  Positive  results are indicative of active infection with SARS-CoV-2.  Clinical  correlation with patient history and other diagnostic information is  necessary to determine patient infection status.  Positive results do  not rule out bacterial infection or co-infection with other viruses. If result is PRESUMPTIVE POSTIVE SARS-CoV-2 nucleic acids Troung BE PRESENT.   A presumptive positive result was obtained on the submitted specimen  and confirmed on repeat testing.  While 2019 novel coronavirus  (SARS-CoV-2) nucleic acids Copp[place]ino be present in the  submitted sample  additional confirmatory testing Bekele be necessary for epidemiological  and / or clinical management purposes  to differentiate between  SARS-CoV-2 and other Sarbecovirus currently known to infect humans.  If clinically indicated additional testing with an alternate test  methodology [phone]) is advised. The SARS-CoV-2 RNA is generally  detectable in upper and lower respiratory sp ecimens during the acute  phase of infection. The expected result is Negative. Fact Sheet for Patients:  StrictlyIdeas.no Fact Sheet for Healthcare Providers: BankingDealers.co.za This test is not yet approved or cleared by the [place] FDA and has been authorized for detection and/or diagnosis of SARS-CoV-2 by FDA under an Emergency Use Authorization (EUA).  This EUA will remain in effect (meaning this test can be used) for the duration of the COVID-19 declaration under Section 564(b)(1) of the Act, 21 U.S.C. section 360bbb-3(b)(1), unless the authorization is terminated or revoked sooner. Performed at [facility], [address] [place], [place] [address]   Lactic acid, plasma     Status: None   Collection Time: 09/27/18  4:43 AM  Result Value Ref Range   Lactic Acid, Venous 1.6 0.5 - 1.9 mmol/L    Comment: Performed at [facility], [place], [place] [address]   Ct Abdomen Pelvis Wo Contrast  Result Date: 09/27/2018 CLINICAL DATA:  Nausea/vomiting, abdominal pain, UTI. EXAM: CT ABDOMEN AND PELVIS WITHOUT CONTRAST TECHNIQUE: Multidetector CT imaging of the abdomen and pelvis was performed following the standard protocol without IV contrast. COMPARISON:  05/19/2011 FINDINGS: Lower chest: Mosaic attenuation at the lung bases. Hepatobiliary: Nodular hepatic contour, raising the possibility of cirrhosis. Status post cholecystectomy. No intrahepatic or extrahepatic duct dilatation. Pancreas: Within normal  limits. Spleen: Normal in size. Adrenals/Urinary Tract: Adrenal glands are within normal limits. Left renal atrophy. Right kidney is within normal limits. No renal, ureteral, or bladder calculi. No hydronephrosis. Bladder is within normal limits. Stomach/Bowel: Stomach is notable for a tiny hiatal hernia. No evidence of bowel obstruction. Appendix is not discretely visualized and Szczesny be surgically absent. Mild rectal wall thickening (series 2/image 77), without inflammatory changes, favoring underdistention. Vascular/Lymphatic: No evidence of abdominal aortic aneurysm. Atherosclerotic calcifications of the abdominal aorta and branch vessels. No suspicious abdominopelvic lymphadenopathy. Reproductive: Status post hysterectomy.  No adnexal masses. Other: No abdominopelvic ascites. Musculoskeletal: Mild degenerative changes of the lower thoracic spine. IMPRESSION: No evidence of bowel obstruction. Appendix is not discretely visualized and Mcneff be surgically absent. No CT findings to account for the patient's abdominal pain. Possible cirrhosis. Left renal atrophy. Status post cholecystectomy and hysterectomy. Electronically Signed   By: Julian Hy M.D.   On: 09/27/2018 06:10    Pending Labs Unresulted Labs (From admission, onward)    Start     Ordered   09/27/18 0823  Troponin I - Now Then Q6H  Now then every 6 hours,   STAT     09/27/18 0822   09/27/18 0424  Urine Culture  Add-on,   AD    Question:  Patient immune status  Answer:  Normal   09/27/18 0424   Signed and Held  CBC  (heparin)  Once,   R    Comments:  Baseline for heparin therapy IF NOT ALREADY DRAWN.  Notify MD if PLT < 100 K.    Signed and Held   Signed and Held  Creatinine, serum  (heparin)  Once,   R    Comments:  Baseline for heparin therapy IF NOT ALREADY DRAWN.    Signed and Held   Signed and Held  CBC  Tomorrow morning,   R     Signed and Held   Signed and Held  Basic metabolic panel  Tomorrow morning,   R     Signed and  Held          Vitals/Pain Today's Vitals   09/27/18 0716 09/27/18 0730 09/27/18 0745 09/27/18 0820  BP: (!) 195/102 (!) 195/67 (!) 192/71 (!) 182/85  Pulse: 77 77 78 76  Resp: (!) 23 15 17 16   Temp:      TempSrc:      SpO2: 98% 99% 99% 97%  Weight:      Height:        Isolation Precautions No active isolations  Medications Medications  hydrALAZINE (APRESOLINE) injection 10 mg (has no administration in time range)  nitroGLYCERIN (NITROGLYN) 2 % ointment 0.5 inch (has no administration in time range)  morphine 2 MG/ML injection 2 mg (has no administration in time range)  labetalol (NORMODYNE) injection 10 mg (has no administration in time range)  alum & mag hydroxide-simeth (MAALOX/MYLANTA) 200-200-20 MG/5ML suspension 30 mL (has no administration in time range)  polyethylene glycol (MIRALAX / GLYCOLAX) packet 17 g (has no administration in time range)  docusate sodium (COLACE) capsule 200 mg (has no administration in time range)  cefTRIAXone (ROCEPHIN) 1 g in sodium chloride 0.9 % 100 mL IVPB (has no administration in time range)  insulin aspart (novoLOG) injection 0-9 Units (has no administration in time range)  insulin aspart (novoLOG) injection 0-5 Units (has no administration in time range)  morphine 4 MG/ML injection 4 mg (4 mg Intravenous Given 09/27/18 0503)  ondansetron (ZOFRAN) injection 4 mg (4  mg Intravenous Given 09/27/18 0503)  cefTRIAXone (ROCEPHIN) 1 g in sodium chloride 0.9 % 100 mL IVPB (0 g Intravenous Stopped 09/27/18 0747)  labetalol (NORMODYNE) injection 5 mg (5 mg Intravenous Given 09/27/18 0655)  hydrALAZINE (APRESOLINE) injection 10 mg (10 mg Intravenous Given 09/27/18 0831)    Mobility walks with person assist Moderate fall risk   Focused Assessments Cardiac Assessment Handoff:  Cardiac Rhythm: Ventricular paced Lab Results  Component Value Date   CKTOTAL 56 01/07/2017   CKMB 1.8 02/06/2014   TROPONINI 0.11 ([place]) 09/27/2018   No results found  for: DDIMER Does the Patient currently have chest pain? No     R Recommendations: See Admitting Provider Note  Report given to:   Additional Notes:  Pt has hx of dementia, pt's husband updated by previous shift RN that pt would be admitted.  Pt received Labetalol for elevated blood pressure and has standing order for PRN labetalol.

## 2018-09-27 NOTE — ED Notes (Signed)
MD waiting for results of CT before possibly giving BP meds

## 2018-09-27 NOTE — ED Notes (Signed)
Date and time results received: 09/27/18 0505 (use smartphrase ".now" to insert current time)  Test: troponin Critical Value: 0.11  Name of Provider Notified: Karma Greaser  Orders Received? Or Actions Taken?

## 2018-09-27 NOTE — ED Notes (Signed)
ED TO INPATIENT HANDOFF REPORT  ED Nurse Name and Phone #: Anda Kraft [phone]  S Name/Age/Gender Andrea [name] 73 y.o. female Room/Bed: ED04A/ED04A  Code Status   Code Status: Prior  Home/SNF/Other Home Patient oriented to: self and place Is this baseline? Yes   Triage Complete: Triage complete  Chief Complaint [facility] Abd Pain  Triage Note Patient arrives via EMS for N/V and abdominal pain, says it's been about 7 days. Patient has history of dementia and is a poor historian. Patient also has UTI, husband has been treating with monostat   Allergies Allergies  Allergen Reactions  . Watermelon Concentrate Anaphylaxis  . Ace Inhibitors   . Celebrex [Celecoxib] Rash  . Penicillins Rash    Has patient had a PCN reaction causing immediate rash, facial/tongue/throat swelling, SOB or lightheadedness with hypotension: Yes Has patient had a PCN reaction causing severe rash involving mucus membranes or skin necrosis:  Has patient had a PCN reaction that required hospitalization: No Has patient had a PCN reaction occurring within the last 10 years: No If all of the above answers are "NO", then [place] proceed with Cephalosporin use. No  . Sulfa Antibiotics Rash    Level of Care/Admitting Diagnosis ED Disposition    ED Disposition Condition Comment   Admit  The patient appears reasonably stabilized for admission considering the current resources, flow, and capabilities available in the ED at this time, and I doubt any other [facility] requiring further screening and/or treatment in the ED prior to admission is  present.       B Medical/Surgery History Past Medical History:  Diagnosis Date  . CHF (congestive heart failure) ([place])   . Chronic back pain    Followed by pain clinic in [place]  . Endometriosis   . HTN (hypertension)   . Hypothyroidism   . IDDM (insulin dependent diabetes mellitus) ([place])   . Mild dementia ([place])   . NASH (nonalcoholic steatohepatitis)    has had liver  biopsy in past  . Obesity   . PUD (peptic ulcer disease) 1990's   Past Surgical History:  Procedure Laterality Date  . CHOLECYSTECTOMY    . TOTAL ABDOMINAL HYSTERECTOMY W/ BILATERAL SALPINGOOPHORECTOMY       A IV Location/Drains/Wounds Patient Lines/Drains/Airways Status   Active Line/Drains/Airways    Name:   Placement date:   Placement time:   Site:   Days:   Peripheral IV 09/27/18 Left Antecubital   09/27/18    0459    Antecubital   less than 1          Intake/Output Last 24 hours No intake or output data in the 24 hours ending 09/27/18 0981  Labs/Imaging Results for orders placed or performed during the hospital encounter of 09/27/18 (from the past 48 hour(s))  Urinalysis, Complete w Microscopic     Status: Abnormal   Collection Time: 09/27/18  4:17 AM  Result Value Ref Range   Color, Urine YELLOW (A) YELLOW   APPearance HAZY (A) CLEAR   Specific Gravity, Urine 1.020 1.005 - 1.030   pH 7.0 5.0 - 8.0   Glucose, UA >=500 (A) NEGATIVE mg/dL   Hgb urine dipstick SMALL (A) NEGATIVE   Bilirubin Urine NEGATIVE NEGATIVE   Ketones, ur 5 (A) NEGATIVE mg/dL   Protein, ur >=300 (A) NEGATIVE mg/dL   Nitrite NEGATIVE NEGATIVE   Leukocytes,Ua TRACE (A) NEGATIVE   RBC / HPF 6-10 0 - 5 RBC/hpf   WBC, UA 21-50 0 - 5 WBC/hpf  Bacteria, UA NONE SEEN NONE SEEN   Squamous Epithelial / LPF 0-5 0 - 5   Mucus PRESENT     Comment: Performed at [facility], [address] [place], [place] [address]  CBC with Differential/Platelet     Status: Abnormal   Collection Time: 09/27/18  4:17 AM  Result Value Ref Range   WBC 11.3 (H) 4.0 - 10.5 K/uL   RBC 5.69 (H) 3.87 - 5.11 MIL/uL   Hemoglobin 15.4 (H) 12.0 - 15.0 g/dL   HCT 44.5 36.0 - 46.0 %   MCV 78.2 (L) 80.0 - 100.0 fL   MCH 27.1 26.0 - 34.0 pg   MCHC 34.6 30.0 - 36.0 g/dL   RDW 14.3 11.5 - 15.5 %   Platelets 237 150 - 400 K/uL   nRBC 0.0 0.0 - 0.2 %   Neutrophils Relative % 81 %   Neutro Abs 9.3 (H) 1.7 - 7.7 K/uL    Lymphocytes Relative 13 %   Lymphs Abs 1.4 0.7 - 4.0 K/uL   Monocytes Relative 4 %   Monocytes Absolute 0.4 0.1 - 1.0 K/uL   Eosinophils Relative 0 %   Eosinophils Absolute 0.0 0.0 - 0.5 K/uL   Basophils Relative 1 %   Basophils Absolute 0.1 0.0 - 0.1 K/uL   Immature Granulocytes 1 %   Abs Immature Granulocytes 0.07 0.00 - 0.07 K/uL    Comment: Performed at [facility], [address] [place], [place] [address]  Comprehensive metabolic panel     Status: Abnormal   Collection Time: 09/27/18  4:17 AM  Result Value Ref Range   Sodium 141 135 - 145 mmol/L   Potassium 3.4 (L) 3.5 - 5.1 mmol/L   Chloride 103 98 - 111 mmol/L   CO2 25 22 - 32 mmol/L   Glucose, Bld 302 (H) 70 - 99 mg/dL   BUN 31 (H) 8 - 23 mg/dL   Creatinine, Ser 1.89 (H) 0.44 - 1.00 mg/dL   Calcium 10.1 8.9 - 10.3 mg/dL   Total Protein 8.1 6.5 - 8.1 g/dL   Albumin 4.2 3.5 - 5.0 g/dL   AST 27 15 - 41 U/L   ALT 14 0 - 44 U/L   Alkaline Phosphatase 172 (H) 38 - 126 U/L   Total Bilirubin 0.9 0.3 - 1.2 mg/dL   GFR calc non Af Amer 26 (L) >60 mL/min   GFR calc Af Amer 30 (L) >60 mL/min   Anion gap 13 5 - 15    Comment: Performed at [facility], [address] [place], [place] [address]  Lipase, blood     Status: None   Collection Time: 09/27/18  4:17 AM  Result Value Ref Range   Lipase 25 11 - 51 U/L    Comment: Performed at [facility], [address] [place], [place] [address]  Troponin I - ONCE - STAT     Status: Abnormal   Collection Time: 09/27/18  4:17 AM  Result Value Ref Range   Troponin I 0.11 (HH) <0.03 ng/mL    Comment: CRITICAL RESULT CALLED TO, READ BACK BY AND VERIFIED WITH KATE Landon Bassford AT 1962 ON 09/27/18 RWW Performed at [facility], [address], [place], [place] [address]   SARS Coronavirus 2 (CEPHEID - Performed in [place] [facility] lab), [facility] Order     Status: None   Collection Time: 09/27/18  4:43 AM  Result Value Ref Range   SARS Coronavirus 2  NEGATIVE NEGATIVE  Comment: (NOTE) If result is NEGATIVE SARS-CoV-2 target nucleic acids are NOT DETECTED. The SARS-CoV-2 RNA is generally detectable in upper and lower  respiratory specimens during the acute phase of infection. The lowest  concentration of SARS-CoV-2 viral copies this assay can detect is 250  copies / mL. A negative result does not preclude SARS-CoV-2 infection  and should not be used as the sole basis for treatment or other  patient management decisions.  A negative result Wiemann occur with  improper specimen collection / handling, submission of specimen other  than nasopharyngeal swab, presence of viral mutation(s) within the  areas targeted by this assay, and inadequate number of viral copies  (<250 copies / mL). A negative result must be combined with clinical  observations, patient history, and epidemiological information. If result is POSITIVE SARS-CoV-2 target nucleic acids are DETECTED. The SARS-CoV-2 RNA is generally detectable in upper and lower  respiratory specimens dur ing the acute phase of infection.  Positive  results are indicative of active infection with SARS-CoV-2.  Clinical  correlation with patient history and other diagnostic information is  necessary to determine patient infection status.  Positive results do  not rule out bacterial infection or co-infection with other viruses. If result is PRESUMPTIVE POSTIVE SARS-CoV-2 nucleic acids Buehner BE PRESENT.   A presumptive positive result was obtained on the submitted specimen  and confirmed on repeat testing.  While 2019 novel coronavirus  (SARS-CoV-2) nucleic acids Barbone be present in the submitted sample  additional confirmatory testing [place] be necessary for epidemiological  and / or clinical management purposes  to differentiate between  SARS-CoV-2 and other Sarbecovirus currently known to infect humans.  If clinically indicated additional testing with an alternate test  methodology [phone]) is  advised. The SARS-CoV-2 RNA is generally  detectable in upper and lower respiratory sp ecimens during the acute  phase of infection. The expected result is Negative. Fact Sheet for Patients:  StrictlyIdeas.no Fact Sheet for Healthcare Providers: BankingDealers.co.za This test is not yet approved or cleared by the [place] FDA and has been authorized for detection and/or diagnosis of SARS-CoV-2 by FDA under an Emergency Use Authorization (EUA).  This EUA will remain in effect (meaning this test can be used) for the duration of the COVID-19 declaration under Section 564(b)(1) of the Act, 21 U.S.C. section 360bbb-3(b)(1), unless the authorization is terminated or revoked sooner. Performed at [facility], [address] [place], [place] [address]   Lactic acid, plasma     Status: None   Collection Time: 09/27/18  4:43 AM  Result Value Ref Range   Lactic Acid, Venous 1.6 0.5 - 1.9 mmol/L    Comment: Performed at [facility], [place], [place] [address]   Ct Abdomen Pelvis Wo Contrast  Result Date: 09/27/2018 CLINICAL DATA:  Nausea/vomiting, abdominal pain, UTI. EXAM: CT ABDOMEN AND PELVIS WITHOUT CONTRAST TECHNIQUE: Multidetector CT imaging of the abdomen and pelvis was performed following the standard protocol without IV contrast. COMPARISON:  05/19/2011 FINDINGS: Lower chest: Mosaic attenuation at the lung bases. Hepatobiliary: Nodular hepatic contour, raising the possibility of cirrhosis. Status post cholecystectomy. No intrahepatic or extrahepatic duct dilatation. Pancreas: Within normal limits. Spleen: Normal in size. Adrenals/Urinary Tract: Adrenal glands are within normal limits. Left renal atrophy. Right kidney is within normal limits. No renal, ureteral, or bladder calculi. No hydronephrosis. Bladder is within normal limits. Stomach/Bowel: Stomach is notable for a tiny hiatal hernia. No evidence of  bowel obstruction. Appendix is not discretely  visualized and Bluitt be surgically absent. Mild rectal wall thickening (series 2/image 77), without inflammatory changes, favoring underdistention. Vascular/Lymphatic: No evidence of abdominal aortic aneurysm. Atherosclerotic calcifications of the abdominal aorta and branch vessels. No suspicious abdominopelvic lymphadenopathy. Reproductive: Status post hysterectomy.  No adnexal masses. Other: No abdominopelvic ascites. Musculoskeletal: Mild degenerative changes of the lower thoracic spine. IMPRESSION: No evidence of bowel obstruction. Appendix is not discretely visualized and Kinkaid be surgically absent. No CT findings to account for the patient's abdominal pain. Possible cirrhosis. Left renal atrophy. Status post cholecystectomy and hysterectomy. Electronically Signed   By: Julian Hy M.D.   On: 09/27/2018 06:10    Pending Labs Unresulted Labs (From admission, onward)    Start     Ordered   09/27/18 0424  Urine Culture  Add-on,   AD    Question:  Patient immune status  Answer:  Normal   09/27/18 0424          Vitals/Pain Today's Vitals   09/27/18 0612 09/27/18 0630 09/27/18 0700 09/27/18 0716  BP: (!) 214/98 (!) 208/94 (!) 205/95 (!) 195/102  Pulse: 83 81 76 77  Resp: (!) 24 18 18  (!) 23  Temp:      TempSrc:      SpO2: 100% 99% 98% 98%  Weight:      Height:        Isolation Precautions No active isolations  Medications Medications  cefTRIAXone (ROCEPHIN) 1 g in sodium chloride 0.9 % 100 mL IVPB ( Intravenous Rate/Dose Verify 09/27/18 0719)  morphine 4 MG/ML injection 4 mg (4 mg Intravenous Given 09/27/18 0503)  ondansetron (ZOFRAN) injection 4 mg (4 mg Intravenous Given 09/27/18 0503)  labetalol (NORMODYNE) injection 5 mg (5 mg Intravenous Given 09/27/18 0655)    Mobility non-ambulatory Moderate fall risk   Focused Assessments Pulmonary Assessment Handoff:  Lung sounds:   O2 Device: Nasal Cannula O2 Flow Rate (L/min): 2  L/min      R Recommendations: See Admitting Provider Note  Report given to:   Additional Notes:

## 2018-09-27 NOTE — ED Notes (Signed)
Pt resting quietly with eyes closed at this time, respirations equal and unlabored.  Introduced self to patient.

## 2018-09-28 ENCOUNTER — Inpatient Hospital Stay: Payer: Medicare HMO

## 2018-09-28 LAB — BASIC METABOLIC PANEL
Anion gap: 9 (ref 5–15)
BUN: 43 mg/dL — ABNORMAL HIGH (ref 8–23)
CO2: 27 mmol/L (ref 22–32)
Calcium: 8.7 mg/dL — ABNORMAL LOW (ref 8.9–10.3)
Chloride: 106 mmol/L (ref 98–111)
Creatinine, Ser: 3.27 mg/dL — ABNORMAL HIGH (ref 0.44–1.00)
GFR calc Af Amer: 16 mL/min — ABNORMAL LOW (ref >60)
GFR calc non Af Amer: 13 mL/min — ABNORMAL LOW (ref >60)
Glucose, Bld: 80 mg/dL (ref 70–99)
Potassium: 3.5 mmol/L (ref 3.5–5.1)
Sodium: 142 mmol/L (ref 135–145)

## 2018-09-28 LAB — GLUCOSE, CAPILLARY
Glucose-Capillary: 83 mg/dL (ref 70–99)
Glucose-Capillary: 259 mg/dL — ABNORMAL HIGH (ref 70–99)
Glucose-Capillary: 224 mg/dL — ABNORMAL HIGH (ref 70–99)
Glucose-Capillary: 356 mg/dL — ABNORMAL HIGH (ref 70–99)

## 2018-09-28 LAB — CBC
HCT: 38.7 % (ref 36.0–46.0)
Hemoglobin: 12.3 g/dL (ref 12.0–15.0)
MCH: 26.7 pg (ref 26.0–34.0)
MCHC: 31.8 g/dL (ref 30.0–36.0)
MCV: 84.1 fL (ref 80.0–100.0)
Platelets: 204 10*3/uL (ref 150–400)
RBC: 4.6 MIL/uL (ref 3.87–5.11)
RDW: 14.8 % (ref 11.5–15.5)
WBC: 12 10*3/uL — ABNORMAL HIGH (ref 4.0–10.5)
nRBC: 0 % (ref 0.0–0.2)

## 2018-09-28 LAB — URINE CULTURE
Culture: NO GROWTH
Special Requests: NORMAL

## 2018-09-28 MED ORDER — SODIUM CHLORIDE 0.9 % IV SOLN
INTRAVENOUS | Status: DC
  Administered 2018-09-28 – 2018-10-02 (×5): via INTRAVENOUS

## 2018-09-28 MED ORDER — PHENOL 1.4 % MT LIQD
1.0000 | OROMUCOSAL | Status: DC | PRN
  Filled 2018-09-28: qty 177

## 2018-09-28 NOTE — Progress Notes (Signed)
[place] at [facility]                                                                                                                                                                                  Patient Demographics   Andrea [name], is a 73 y.o. female, DOB - [birth year], JJH:[id number]  Admit date - 09/27/2018   Admitting Physician Dustin Flock, MD  Outpatient Primary MD for the patient is Aline August, MD   LOS - 1  Subjective: Patient's abdominal pain now resolved however renal function is worsening     Review of Systems:   CONSTITUTIONAL: No documented fever. No fatigue, weakness. No weight gain, no weight loss.  EYES: No blurry or double vision.  ENT: No tinnitus. No postnasal drip. No redness of the oropharynx.  RESPIRATORY: No cough, no wheeze, no hemoptysis. No dyspnea.  CARDIOVASCULAR: No chest pain. No orthopnea. No palpitations. No syncope.  GASTROINTESTINAL: No nausea, no vomiting or diarrhea. No abdominal pain. No melena or hematochezia.  GENITOURINARY: No dysuria or hematuria.  ENDOCRINE: No polyuria or nocturia. No heat or cold intolerance.  HEMATOLOGY: No anemia. No bruising. No bleeding.  INTEGUMENTARY: No rashes. No lesions.  MUSCULOSKELETAL: No arthritis. No swelling. No gout.  NEUROLOGIC: No numbness, tingling, or ataxia. No seizure-type activity.  PSYCHIATRIC: No anxiety. No insomnia. No ADD.    Vitals:   Vitals:   09/27/18 2106 09/28/18 0445 09/28/18 0800 09/28/18 0933  BP: 130/70 (!) 112/59 (!) 97/49 (!) 97/49  Pulse: 67 (!) 59 (!) 59   Resp:  18 18   Temp:  98 F (36.7 C) 98.7 F (37.1 C)   TempSrc:  Oral Oral   SpO2: 98% 94% 93%   Weight:      Height:        Wt Readings from Last 3 Encounters:  09/27/18 74.6 kg  03/08/17 84.4 kg  01/05/17 89.4 kg     Intake/Output Summary (Last 24 hours) at 09/28/2018 1359 Last data filed at 09/27/2018 2325 Gross per 24 hour  Intake -  Output 50 ml  Net -50 ml     Physical Exam:   GENERAL: Pleasant-appearing in no apparent distress.  HEAD, EYES, EARS, NOSE AND THROAT: Atraumatic, normocephalic. Extraocular muscles are intact. Pupils equal and reactive to light. Sclerae anicteric. No conjunctival injection. No oro-pharyngeal erythema.  NECK: Supple. There is no jugular venous distention. No bruits, no lymphadenopathy, no thyromegaly.  HEART: Regular rate and rhythm,. No murmurs, no rubs, no clicks.  LUNGS: Clear to auscultation bilaterally. No rales or rhonchi. No wheezes.  ABDOMEN: Soft, flat, nontender, nondistended. Has good bowel sounds. No hepatosplenomegaly appreciated.  EXTREMITIES:  No evidence of any cyanosis, clubbing, or peripheral edema.  +2 pedal and radial pulses bilaterally.  NEUROLOGIC: The patient is alert, awake, and oriented x3 with no focal motor or sensory deficits appreciated bilaterally.  SKIN: Moist and warm with no rashes appreciated.  Psych: Not anxious, depressed LN: No inguinal LN enlargement    Antibiotics   Anti-infectives (From admission, onward)   Start     Dose/Rate Route Frequency Ordered Stop   09/28/18 0800  cefTRIAXone (ROCEPHIN) 1 g in sodium chloride 0.9 % 100 mL IVPB     1 g 200 mL/hr over 30 Minutes Intravenous Every 24 hours 09/27/18 0822     09/27/18 0645  cefTRIAXone (ROCEPHIN) 1 g in sodium chloride 0.9 % 100 mL IVPB     1 g 200 mL/hr over 30 Minutes Intravenous STAT 09/27/18 0631 09/27/18 0747      Medications   Scheduled Meds: . alum & mag hydroxide-simeth  30 mL Oral Q6H  . aspirin EC  81 mg Oral Daily  . atorvastatin  80 mg Oral Daily  . cholecalciferol  1,000 Units Oral Daily  . docusate sodium  200 mg Oral BID  . gabapentin  300 mg Oral BID  . heparin  5,000 Units Subcutaneous Q8H  . insulin aspart  0-5 Units Subcutaneous QHS  . insulin aspart  0-9 Units Subcutaneous TID WC  . insulin glargine  30 Units Subcutaneous Daily  . levothyroxine  175 mcg Oral Q0600  . mouth rinse  15 mL  Mouth Rinse BID  . nitroGLYCERIN  0.5 inch Topical Q6H  . PARoxetine  40 mg Oral Daily  . polyethylene glycol  17 g Oral Daily  . sodium chloride flush  3 mL Intravenous Q12H  . vitamin B-12  5,000 mcg Oral Daily   Continuous Infusions: . sodium chloride    . sodium chloride 75 mL/hr at 09/28/18 1236  . cefTRIAXone (ROCEPHIN)  IV 1 g (09/28/18 0900)   PRN Meds:.sodium chloride, acetaminophen, albuterol, hydrALAZINE, ipratropium, labetalol, morphine injection, ondansetron **OR** ondansetron (ZOFRAN) IV, phenol, sodium chloride flush, traZODone   Data Review:   Micro Results Recent Results (from the past 240 hour(s))  Urine Culture     Status: None   Collection Time: 09/27/18  4:17 AM  Result Value Ref Range Status   Specimen Description   Final    URINE, CATHETERIZED Performed at [facility], [address] [place], [place] [address]    Special Requests   Final    Normal Performed at [facility], [address] [place], [place] [address]    Culture   Final    NO GROWTH Performed at [facility], [address], [place], [place] [address]    Report Status 09/28/2018 FINAL  Final  SARS Coronavirus 2 (CEPHEID - Performed in [place] hospital lab), Hosp Order     Status: None   Collection Time: 09/27/18  4:43 AM  Result Value Ref Range Status   SARS Coronavirus 2 NEGATIVE NEGATIVE Final    Comment: (NOTE) If result is NEGATIVE SARS-CoV-2 target nucleic acids are NOT DETECTED. The SARS-CoV-2 RNA is generally detectable in upper and lower  respiratory specimens during the acute phase of infection. The lowest  concentration of SARS-CoV-2 viral copies this assay can detect is 250  copies / mL. A negative result does not preclude SARS-CoV-2 infection  and should not be used as the sole basis for treatment or other  patient management decisions.  A  negative result Saetern occur with  improper specimen collection / handling, submission of specimen  other  than nasopharyngeal swab, presence of viral mutation(s) within the  areas targeted by this assay, and inadequate number of viral copies  (<250 copies / mL). A negative result must be combined with clinical  observations, patient history, and epidemiological information. If result is POSITIVE SARS-CoV-2 target nucleic acids are DETECTED. The SARS-CoV-2 RNA is generally detectable in upper and lower  respiratory specimens dur ing the acute phase of infection.  Positive  results are indicative of active infection with SARS-CoV-2.  Clinical  correlation with patient history and other diagnostic information is  necessary to determine patient infection status.  Positive results do  not rule out bacterial infection or co-infection with other viruses. If result is PRESUMPTIVE POSTIVE SARS-CoV-2 nucleic acids Gonet BE PRESENT.   A presumptive positive result was obtained on the submitted specimen  and confirmed on repeat testing.  While 2019 novel coronavirus  (SARS-CoV-2) nucleic acids Sahlin be present in the submitted sample  additional confirmatory testing Sanders be necessary for epidemiological  and / or clinical management purposes  to differentiate between  SARS-CoV-2 and other Sarbecovirus currently known to infect humans.  If clinically indicated additional testing with an alternate test  methodology [phone]) is advised. The SARS-CoV-2 RNA is generally  detectable in upper and lower respiratory sp ecimens during the acute  phase of infection. The expected result is Negative. Fact Sheet for Patients:  StrictlyIdeas.no Fact Sheet for Healthcare Providers: BankingDealers.co.za This test is not yet approved or cleared by the [place] FDA and has been authorized for detection and/or diagnosis of SARS-CoV-2 by FDA under an Emergency Use Authorization (EUA).  This EUA will remain in effect (meaning this test can be used) for the duration  of the COVID-19 declaration under Section 564(b)(1) of the Act, 21 U.S.C. section 360bbb-3(b)(1), unless the authorization is terminated or revoked sooner. Performed at [facility], [address] [place], [place] [address]     Radiology Reports Ct Abdomen Pelvis Wo Contrast  Result Date: 09/27/2018 CLINICAL DATA:  Nausea/vomiting, abdominal pain, UTI. EXAM: CT ABDOMEN AND PELVIS WITHOUT CONTRAST TECHNIQUE: Multidetector CT imaging of the abdomen and pelvis was performed following the standard protocol without IV contrast. COMPARISON:  05/19/2011 FINDINGS: Lower chest: Mosaic attenuation at the lung bases. Hepatobiliary: Nodular hepatic contour, raising the possibility of cirrhosis. Status post cholecystectomy. No intrahepatic or extrahepatic duct dilatation. Pancreas: Within normal limits. Spleen: Normal in size. Adrenals/Urinary Tract: Adrenal glands are within normal limits. Left renal atrophy. Right kidney is within normal limits. No renal, ureteral, or bladder calculi. No hydronephrosis. Bladder is within normal limits. Stomach/Bowel: Stomach is notable for a tiny hiatal hernia. No evidence of bowel obstruction. Appendix is not discretely visualized and Lambson be surgically absent. Mild rectal wall thickening (series 2/image 77), without inflammatory changes, favoring underdistention. Vascular/Lymphatic: No evidence of abdominal aortic aneurysm. Atherosclerotic calcifications of the abdominal aorta and branch vessels. No suspicious abdominopelvic lymphadenopathy. Reproductive: Status post hysterectomy.  No adnexal masses. Other: No abdominopelvic ascites. Musculoskeletal: Mild degenerative changes of the lower thoracic spine. IMPRESSION: No evidence of bowel obstruction. Appendix is not discretely visualized and Stille be surgically absent. No CT findings to account for the patient's abdominal pain. Possible cirrhosis. Left renal atrophy. Status post cholecystectomy and hysterectomy.  Electronically Signed   By: Julian Hy M.D.   On: 09/27/2018 06:10   US Renal  Result Date: 09/28/2018 CLINICAL DATA:  Acute renal failure  EXAM: RENAL / URINARY TRACT ULTRASOUND COMPLETE COMPARISON:  CT scan [place] 16, 2020 FINDINGS: Right Kidney: Renal measurements: Acute renal failure = volume: 9.1 x 4.8 x 5 cm mL. One hundred fifteen Left Kidney: Renal measurements: 5.8 by 4.1 x 3.8 cm = volume: 47 mL. Increased cortical echogenicity and cortical thinning. Bladder: Appears normal for degree of bladder distention. IMPRESSION: The left kidney is atrophic compared to the right, correlating with recent CT findings. No cause for acute renal failure noted. Electronically Signed   By: Dorise Bullion III M.D   On: 09/28/2018 10:58     CBC Recent Labs  Lab 09/27/18 0417 09/27/18 1418 09/28/18 0541  WBC 11.3* 12.5* 12.0*  HGB 15.4* 12.7 12.3  HCT 44.5 38.7 38.7  PLT 237 207 204  MCV 78.2* 81.0 84.1  MCH 27.1 26.6 26.7  MCHC 34.6 32.8 31.8  RDW 14.3 14.5 14.8  LYMPHSABS 1.4  --   --   MONOABS 0.4  --   --   EOSABS 0.0  --   --   BASOSABS 0.1  --   --     Chemistries  Recent Labs  Lab 09/27/18 0417 09/27/18 1418 09/28/18 0541  NA 141  --  142  K 3.4*  --  3.5  CL 103  --  106  CO2 25  --  27  GLUCOSE 302*  --  80  BUN 31*  --  43*  CREATININE 1.89* 1.89* 3.27*  CALCIUM 10.1  --  8.7*  AST 27  --   --   ALT 14  --   --   ALKPHOS 172*  --   --   BILITOT 0.9  --   --    ------------------------------------------------------------------------------------------------------------------ estimated creatinine clearance is 14.4 mL/min (A) (by C-G formula based on SCr of 3.27 mg/dL (H)). ------------------------------------------------------------------------------------------------------------------ No results for input(s): HGBA1C in the last 72 hours. ------------------------------------------------------------------------------------------------------------------ No results for  input(s): CHOL, HDL, LDLCALC, TRIG, CHOLHDL, LDLDIRECT in the last 72 hours. ------------------------------------------------------------------------------------------------------------------ No results for input(s): TSH, T4TOTAL, T3FREE, THYROIDAB in the last 72 hours.  Invalid input(s): FREET3 ------------------------------------------------------------------------------------------------------------------ No results for input(s): VITAMINB12, FOLATE, FERRITIN, TIBC, IRON, RETICCTPCT in the last 72 hours.  Coagulation profile No results for input(s): INR, PROTIME in the last 168 hours.  No results for input(s): DDIMER in the last 72 hours.  Cardiac Enzymes Recent Labs  Lab 09/27/18 0859 09/27/18 1418 09/27/18 2013  TROPONINI 0.19* 0.17* 0.22*   ------------------------------------------------------------------------------------------------------------------ Invalid input(s): POCBNP    Assessment & Plan   IMPRESSION AND PLAN: Patient 73 year old female with history of dementia presenting with abdominal pain  1.  Abdominal pain due to constipation and gas  Continue supportive care  2.  Acute renal failure on chronic kidney disease we will give IV fluids hold antihypertensives due to low blood pressure  3.  Accelerated hypertension now resolved  4.  Elevated troponin suspect this is due to demand ischemia we will follow troponin levels cardiology consult if troponin trends upward  5.  Chronic diastolic CHF currently compensated continue to monitor  6.  Diabetes type 2 with elevated blood sugar we will place on sliding scale insulin continue her home insulin   7  Chronic kidney disease monitor renal function  8.  Dementia monitor mental status     Code Status Orders  (From admission, onward)         Start     Ordered   09/27/18 1207  Full code  Continuous  09/27/18 1207        Code Status History    Date Active Date Inactive Code Status Order ID  Comments User Context   03/08/2017 0309 03/09/2017 1455 Full Code [id number]  Vaughan Basta, MD Inpatient   01/05/2017 1629 01/11/2017 1802 Full Code [id number]  Nicholes Mango, MD ED   06/18/2016 0329 06/19/2016 1954 Full Code [id number]  Saundra Shelling, MD Inpatient    Advance Directive Documentation     Most Recent Value  Type of Advance Directive  Living will  Pre-existing out of facility DNR order (yellow form or pink MOST form)  -  "MOST" Form in Place?  -           Consults none  DVT Prophylaxis  Lovenox  Lab Results  Component Value Date   PLT 204 09/28/2018     Time Spent in minutes 35-minute greater than 50% of time spent in care coordination and counseling patient regarding the condition and plan of care.   Dustin Flock M.D on 09/28/2018 at 1:59 PM  Between 7am to 6pm - Pager - [phone]  After 6pm go to [email or web address] - Proofreader  [employer]   Office  [phone]

## 2018-09-28 NOTE — Progress Notes (Signed)
Advanced care plan.  Purpose of the Encounter: CODE STATUS  Parties in Attendance: Patient herself due to dementia spoke to her husband [name]  Patient's Decision Capacity: Not intact  Subjective/Patient's story: Andrea [name]  is a 73 y.o. female with a known history of chronic diastolic CHF, chronic back pain, hypertension, hypothyroidism, insulin-dependent diabetes, dementia, [name], and chronic kidney disease who presents with abdominal pain and nausea.  Patient noted to have acute renal failure today.   Objective/Medical story Due to patient's multiple medical problems I discussed with the husband regarding patient's living will and healthcare power of attorney and CODE STATUS.  He states that she is a full code.  Would want for her to have everything done including CPR and intubation.   Goals of care determination: Full code, husband has healthcare power of attorney and has living will    CODE STATUS: Full code   Time spent discussing advanced care planning: 16 minutes

## 2018-09-29 LAB — GLUCOSE, CAPILLARY
Glucose-Capillary: 290 mg/dL — ABNORMAL HIGH (ref 70–99)
Glucose-Capillary: 298 mg/dL — ABNORMAL HIGH (ref 70–99)
Glucose-Capillary: 315 mg/dL — ABNORMAL HIGH (ref 70–99)
Glucose-Capillary: 258 mg/dL — ABNORMAL HIGH (ref 70–99)
Glucose-Capillary: 318 mg/dL — ABNORMAL HIGH (ref 70–99)

## 2018-09-29 LAB — BASIC METABOLIC PANEL
Anion gap: 11 (ref 5–15)
BUN: 60 mg/dL — ABNORMAL HIGH (ref 8–23)
CO2: 26 mmol/L (ref 22–32)
Calcium: 8.5 mg/dL — ABNORMAL LOW (ref 8.9–10.3)
Chloride: 99 mmol/L (ref 98–111)
Creatinine, Ser: 3.57 mg/dL — ABNORMAL HIGH (ref 0.44–1.00)
GFR calc Af Amer: 14 mL/min — ABNORMAL LOW (ref >60)
GFR calc non Af Amer: 12 mL/min — ABNORMAL LOW (ref >60)
Glucose, Bld: 344 mg/dL — ABNORMAL HIGH (ref 70–99)
Potassium: 4.2 mmol/L (ref 3.5–5.1)
Sodium: 136 mmol/L (ref 135–145)

## 2018-09-29 LAB — PROTEIN / CREATININE RATIO, URINE
Creatinine, Urine: 68 mg/dL
Protein Creatinine Ratio: 2.32 mg/mg{Cre} — ABNORMAL HIGH (ref 0.00–0.15)
Total Protein, Urine: 158 mg/dL

## 2018-09-29 MED ORDER — AMLODIPINE BESYLATE 10 MG PO TABS
10.0000 mg | ORAL_TABLET | Freq: Every day | ORAL | Status: DC
  Administered 2018-09-29 – 2018-10-03 (×4): 10 mg via ORAL
  Filled 2018-09-29 (×4): qty 1

## 2018-09-29 MED ORDER — PROCHLORPERAZINE EDISYLATE 10 MG/2ML IJ SOLN
5.0000 mg | INTRAMUSCULAR | Status: DC | PRN
  Administered 2018-09-30 (×2): 5 mg via INTRAVENOUS
  Filled 2018-09-29 (×3): qty 1

## 2018-09-29 MED ORDER — INSULIN GLARGINE 100 UNIT/ML ~~LOC~~ SOLN
36.0000 [IU] | Freq: Every day | SUBCUTANEOUS | Status: DC
  Administered 2018-09-29 – 2018-09-30 (×2): 36 [IU] via SUBCUTANEOUS
  Filled 2018-09-29 (×2): qty 0.36

## 2018-09-29 NOTE — Progress Notes (Signed)
PT Cancellation Note  Patient Details Name: Andrea [name] MRN: [id number] DOB: 1945/10/28   Cancelled Treatment:    Reason Eval/Treat Not Completed: Medical issues which prohibited therapy(Chart reivewed, most recent BP 197/12mmHg, RN awaiting MD call back. Will hold at this time, attempt PT evaluation again at later date/time. )  3:57 PM, 09/29/18 Etta Grandchild, PT, DPT Physical Therapist - [facility]  [phone] ([place])    [place] C 09/29/2018, 3:57 PM

## 2018-09-29 NOTE — Progress Notes (Signed)
[place] at [facility]                                                                                                                                                                                  Patient Demographics   Andrea [name], is a 73 y.o. female, DOB - 09/08/1945, XHB:[id number]  Admit date - 09/27/2018   Admitting Physician Dustin Flock, MD  Outpatient Primary MD for the patient is Aline August, MD   LOS - 2  Subjective:  Patient states that she is feeling little nauseous.  Renal function is worse today    Review of Systems:   CONSTITUTIONAL: No documented fever. No fatigue, weakness. No weight gain, no weight loss.  EYES: No blurry or double vision.  ENT: No tinnitus. No postnasal drip. No redness of the oropharynx.  RESPIRATORY: No cough, no wheeze, no hemoptysis. No dyspnea.  CARDIOVASCULAR: No chest pain. No orthopnea. No palpitations. No syncope.  GASTROINTESTINAL: Positive nausea, no vomiting or diarrhea. No abdominal pain. No melena or hematochezia.  GENITOURINARY: No dysuria or hematuria.  ENDOCRINE: No polyuria or nocturia. No heat or cold intolerance.  HEMATOLOGY: No anemia. No bruising. No bleeding.  INTEGUMENTARY: No rashes. No lesions.  MUSCULOSKELETAL: No arthritis. No swelling. No gout.  NEUROLOGIC: No numbness, tingling, or ataxia. No seizure-type activity.  PSYCHIATRIC: No anxiety. No insomnia. No ADD.    Vitals:   Vitals:   09/28/18 0933 09/28/18 2033 09/29/18 0406 09/29/18 0736  BP: (!) 97/49 (!) 184/68 (!) 168/71 (!) 179/69  Pulse:  74 82 78  Resp:      Temp:  98 F (36.7 C) 98.5 F (36.9 C) 97.8 F (36.6 C)  TempSrc:  Oral Oral Oral  SpO2:  93% 93% 92%  Weight:   79.3 kg   Height:        Wt Readings from Last 3 Encounters:  09/29/18 79.3 kg  03/08/17 84.4 kg  01/05/17 89.4 kg     Intake/Output Summary (Last 24 hours) at 09/29/2018 1234 Last data filed at 09/29/2018 1016 Gross per 24 hour  Intake  1419.49 ml  Output 650 ml  Net 769.49 ml    Physical Exam:   GENERAL: Pleasant-appearing in no apparent distress.  HEAD, EYES, EARS, NOSE AND THROAT: Atraumatic, normocephalic. Extraocular muscles are intact. Pupils equal and reactive to light. Sclerae anicteric. No conjunctival injection. No oro-pharyngeal erythema.  NECK: Supple. There is no jugular venous distention. No bruits, no lymphadenopathy, no thyromegaly.  HEART: Regular rate and rhythm,. No murmurs, no rubs, no clicks.  LUNGS: Clear to auscultation bilaterally. No rales or rhonchi. No wheezes.  ABDOMEN: Soft, flat, nontender, nondistended. Has  good bowel sounds. No hepatosplenomegaly appreciated.  EXTREMITIES: No evidence of any cyanosis, clubbing, or peripheral edema.  +2 pedal and radial pulses bilaterally.  NEUROLOGIC: The patient is alert, awake, and oriented x3 with no focal motor or sensory deficits appreciated bilaterally.  SKIN: Moist and warm with no rashes appreciated.  Psych: Not anxious, depressed LN: No inguinal LN enlargement    Antibiotics   Anti-infectives (From admission, onward)   Start     Dose/Rate Route Frequency Ordered Stop   09/28/18 0800  cefTRIAXone (ROCEPHIN) 1 g in sodium chloride 0.9 % 100 mL IVPB  Status:  Discontinued     1 g 200 mL/hr over 30 Minutes Intravenous Every 24 hours 09/27/18 0822 09/29/18 0921   09/27/18 0645  cefTRIAXone (ROCEPHIN) 1 g in sodium chloride 0.9 % 100 mL IVPB     1 g 200 mL/hr over 30 Minutes Intravenous STAT 09/27/18 0631 09/27/18 0747      Medications   Scheduled Meds: . amLODipine  10 mg Oral Daily  . aspirin EC  81 mg Oral Daily  . atorvastatin  80 mg Oral Daily  . cholecalciferol  1,000 Units Oral Daily  . docusate sodium  200 mg Oral BID  . gabapentin  300 mg Oral BID  . heparin  5,000 Units Subcutaneous Q8H  . insulin aspart  0-5 Units Subcutaneous QHS  . insulin aspart  0-9 Units Subcutaneous TID WC  . insulin glargine  36 Units Subcutaneous  Daily  . levothyroxine  175 mcg Oral Q0600  . mouth rinse  15 mL Mouth Rinse BID  . nitroGLYCERIN  0.5 inch Topical Q6H  . PARoxetine  40 mg Oral Daily  . polyethylene glycol  17 g Oral Daily  . sodium chloride flush  3 mL Intravenous Q12H  . vitamin B-12  5,000 mcg Oral Daily   Continuous Infusions: . sodium chloride    . sodium chloride 75 mL/hr at 09/29/18 0600   PRN Meds:.sodium chloride, acetaminophen, albuterol, hydrALAZINE, ipratropium, labetalol, morphine injection, ondansetron **OR** ondansetron (ZOFRAN) IV, phenol, sodium chloride flush, traZODone   Data Review:   Micro Results Recent Results (from the past 240 hour(s))  Urine Culture     Status: None   Collection Time: 09/27/18  4:17 AM  Result Value Ref Range Status   Specimen Description   Final    URINE, CATHETERIZED Performed at [facility], [address] [place], [place] [address]    Special Requests   Final    Normal Performed at [facility], [address] [place], [place] [address]    Culture   Final    NO GROWTH Performed at [facility], [place] [address] [place], [place] [address]    Report Status 09/28/2018 FINAL  Final  SARS Coronavirus 2 (CEPHEID - Performed in [place] hospital lab), Hosp Order     Status: None   Collection Time: 09/27/18  4:43 AM  Result Value Ref Range Status   SARS Coronavirus 2 NEGATIVE NEGATIVE Final    Comment: (NOTE) If result is NEGATIVE SARS-CoV-2 target nucleic acids are NOT DETECTED. The SARS-CoV-2 RNA is generally detectable in upper and lower  respiratory specimens during the acute phase of infection. The lowest  concentration of SARS-CoV-2 viral copies this assay can detect is 250  copies / mL. A negative result does not preclude SARS-CoV-2 infection  and should not be used as the sole basis for treatment or other  patient management decisions.  A negative  result Fleer occur with  improper specimen collection / handling, submission  of specimen other  than nasopharyngeal swab, presence of viral mutation(s) within the  areas targeted by this assay, and inadequate number of viral copies  (<250 copies / mL). A negative result must be combined with clinical  observations, patient history, and epidemiological information. If result is POSITIVE SARS-CoV-2 target nucleic acids are DETECTED. The SARS-CoV-2 RNA is generally detectable in upper and lower  respiratory specimens dur ing the acute phase of infection.  Positive  results are indicative of active infection with SARS-CoV-2.  Clinical  correlation with patient history and other diagnostic information is  necessary to determine patient infection status.  Positive results do  not rule out bacterial infection or co-infection with other viruses. If result is PRESUMPTIVE POSTIVE SARS-CoV-2 nucleic acids Polus BE PRESENT.   A presumptive positive result was obtained on the submitted specimen  and confirmed on repeat testing.  While 2019 novel coronavirus  (SARS-CoV-2) nucleic acids Scarber be present in the submitted sample  additional confirmatory testing [place] be necessary for epidemiological  and / or clinical management purposes  to differentiate between  SARS-CoV-2 and other Sarbecovirus currently known to infect humans.  If clinically indicated additional testing with an alternate test  methodology [phone]) is advised. The SARS-CoV-2 RNA is generally  detectable in upper and lower respiratory sp ecimens during the acute  phase of infection. The expected result is Negative. Fact Sheet for Patients:  StrictlyIdeas.no Fact Sheet for Healthcare Providers: BankingDealers.co.za This test is not yet approved or cleared by the [place] FDA and has been authorized for detection and/or diagnosis of SARS-CoV-2 by FDA under an Emergency Use Authorization (EUA).  This EUA will remain in effect (meaning this test can be used) for  the duration of the COVID-19 declaration under Section 564(b)(1) of the Act, 21 U.S.C. section 360bbb-3(b)(1), unless the authorization is terminated or revoked sooner. Performed at [facility], [address], [place], [place] [address]     Radiology Reports Ct Abdomen Pelvis Wo Contrast  Result Date: 09/27/2018 CLINICAL DATA:  Nausea/vomiting, abdominal pain, UTI. EXAM: CT ABDOMEN AND PELVIS WITHOUT CONTRAST TECHNIQUE: Multidetector CT imaging of the abdomen and pelvis was performed following the standard protocol without IV contrast. COMPARISON:  05/19/2011 FINDINGS: Lower chest: Mosaic attenuation at the lung bases. Hepatobiliary: Nodular hepatic contour, raising the possibility of cirrhosis. Status post cholecystectomy. No intrahepatic or extrahepatic duct dilatation. Pancreas: Within normal limits. Spleen: Normal in size. Adrenals/Urinary Tract: Adrenal glands are within normal limits. Left renal atrophy. Right kidney is within normal limits. No renal, ureteral, or bladder calculi. No hydronephrosis. Bladder is within normal limits. Stomach/Bowel: Stomach is notable for a tiny hiatal hernia. No evidence of bowel obstruction. Appendix is not discretely visualized and Moncrief be surgically absent. Mild rectal wall thickening (series 2/image 77), without inflammatory changes, favoring underdistention. Vascular/Lymphatic: No evidence of abdominal aortic aneurysm. Atherosclerotic calcifications of the abdominal aorta and branch vessels. No suspicious abdominopelvic lymphadenopathy. Reproductive: Status post hysterectomy.  No adnexal masses. Other: No abdominopelvic ascites. Musculoskeletal: Mild degenerative changes of the lower thoracic spine. IMPRESSION: No evidence of bowel obstruction. Appendix is not discretely visualized and Schwimmer be surgically absent. No CT findings to account for the patient's abdominal pain. Possible cirrhosis. Left renal atrophy. Status post cholecystectomy and  hysterectomy. Electronically Signed   By: Julian Hy M.D.   On: 09/27/2018 06:10   US Renal  Result Date: 09/28/2018 CLINICAL DATA:  Acute renal failure EXAM:  RENAL / URINARY TRACT ULTRASOUND COMPLETE COMPARISON:  CT scan [place] 16, 2020 FINDINGS: Right Kidney: Renal measurements: Acute renal failure = volume: 9.1 x 4.8 x 5 cm mL. One hundred fifteen Left Kidney: Renal measurements: 5.8 by 4.1 x 3.8 cm = volume: 47 mL. Increased cortical echogenicity and cortical thinning. Bladder: Appears normal for degree of bladder distention. IMPRESSION: The left kidney is atrophic compared to the right, correlating with recent CT findings. No cause for acute renal failure noted. Electronically Signed   By: Dorise Bullion III M.D   On: 09/28/2018 10:58     CBC Recent Labs  Lab 09/27/18 0417 09/27/18 1418 09/28/18 0541  WBC 11.3* 12.5* 12.0*  HGB 15.4* 12.7 12.3  HCT 44.5 38.7 38.7  PLT 237 207 204  MCV 78.2* 81.0 84.1  MCH 27.1 26.6 26.7  MCHC 34.6 32.8 31.8  RDW 14.3 14.5 14.8  LYMPHSABS 1.4  --   --   MONOABS 0.4  --   --   EOSABS 0.0  --   --   BASOSABS 0.1  --   --     Chemistries  Recent Labs  Lab 09/27/18 0417 09/27/18 1418 09/28/18 0541 09/29/18 0556  NA 141  --  142 136  K 3.4*  --  3.5 4.2  CL 103  --  106 99  CO2 25  --  27 26  GLUCOSE 302*  --  80 344*  BUN 31*  --  43* 60*  CREATININE 1.89* 1.89* 3.27* 3.57*  CALCIUM 10.1  --  8.7* 8.5*  AST 27  --   --   --   ALT 14  --   --   --   ALKPHOS 172*  --   --   --   BILITOT 0.9  --   --   --    ------------------------------------------------------------------------------------------------------------------ estimated creatinine clearance is 13.6 mL/min (A) (by C-G formula based on SCr of 3.57 mg/dL (H)). ------------------------------------------------------------------------------------------------------------------ No results for input(s): HGBA1C in the last 72  hours. ------------------------------------------------------------------------------------------------------------------ No results for input(s): CHOL, HDL, LDLCALC, TRIG, CHOLHDL, LDLDIRECT in the last 72 hours. ------------------------------------------------------------------------------------------------------------------ No results for input(s): TSH, T4TOTAL, T3FREE, THYROIDAB in the last 72 hours.  Invalid input(s): FREET3 ------------------------------------------------------------------------------------------------------------------ No results for input(s): VITAMINB12, FOLATE, FERRITIN, TIBC, IRON, RETICCTPCT in the last 72 hours.  Coagulation profile No results for input(s): INR, PROTIME in the last 168 hours.  No results for input(s): DDIMER in the last 72 hours.  Cardiac Enzymes Recent Labs  Lab 09/27/18 0859 09/27/18 1418 09/27/18 2013  TROPONINI 0.19* 0.17* 0.22*   ------------------------------------------------------------------------------------------------------------------ Invalid input(s): POCBNP    Assessment & Plan   IMPRESSION AND PLAN: Patient 73 year old female with history of dementia presenting with abdominal pain  1.  Abdominal pain due to constipation and gas  Continue supportive care  2.  Acute renal failure on chronic kidney disease  Renal function slightly worse.  Nephrology has seen the patient ultrasound has been reviewed  3.  Accelerated hypertension now resolved blood pressure increased now yesterday was low we will start patient on Norvasc  4.  Elevated troponin suspect this is due to demand ischemia we will follow troponin levels cardiology consult if troponin trends upward  5.  Chronic diastolic CHF currently compensated continue to monitor  6.  Diabetes type 2 with elevated blood sugar we will place on sliding scale insulin continue her home insulin  7.  Dementia monitor mental status     Code Status Orders  (From  admission, onward)         Start     Ordered   09/27/18 1207  Full code  Continuous     09/27/18 1207        Code Status History    Date Active Date Inactive Code Status Order ID Comments User Context   03/08/2017 0309 03/09/2017 1455 Full Code [id number]  Vaughan Basta, MD Inpatient   01/05/2017 1629 01/11/2017 1802 Full Code [id number]  Nicholes Mango, MD ED   06/18/2016 0329 06/19/2016 1954 Full Code [id number]  Saundra Shelling, MD Inpatient    Advance Directive Documentation     Most Recent Value  Type of Advance Directive  Living will  Pre-existing out of facility DNR order (yellow form or pink MOST form)  -  "MOST" Form in Place?  -           Consults none  DVT Prophylaxis  Lovenox  Lab Results  Component Value Date   PLT 204 09/28/2018     Time Spent in minutes 35-minute greater than 50% of time spent in care coordination and counseling patient regarding the condition and plan of care.   Dustin Flock M.D on 09/29/2018 at 12:34 PM  Between 7am to 6pm - Pager - [phone]  After 6pm go to [email or web address] - Proofreader  [employer]   Office  [phone]

## 2018-09-29 NOTE — Plan of Care (Signed)
Texted Dr. Posey Pronto b/c of patient's elevated BP and kidney function.  Nephrology is consulted.  She had a renal U/S yesterday.  Pt confirmed that only 1 kidney is functioning.  Amlodopine restarted.  Bladder scan done today w/106 cc showing.  She is using a purewick to monitor output.  Pt cooperative but not good historian.

## 2018-09-29 NOTE — Progress Notes (Signed)
[facility]  ROUNDING NOTE   Subjective:  Patient well-known to [place] from prior admission. She presented with abdominal pain and nausea. She is a poor historian. We are asked to see her for acute renal failure in the setting of known chronic kidney disease stage IV with a baseline creatinine of 1.8 and EGFR 26.   Objective:  Vital signs in last 24 hours:  Temp:  [97.8 F (36.6 C)-98.5 F (36.9 C)] 97.8 F (36.6 C) (05/18 0736) Pulse Rate:  [74-82] 78 (05/18 0736) BP: (168-184)/(68-71) 179/69 (05/18 0736) SpO2:  [92 %-93 %] 92 % (05/18 0736) Weight:  [79.3 kg] 79.3 kg (05/18 0406)  Weight change: 4.717 kg Filed Weights   09/27/18 0412 09/27/18 0947 09/29/18 0406  Weight: 104.3 kg 74.6 kg 79.3 kg    Intake/Output: I/O last 3 completed shifts: In: 1179.5 [I.V.:1179.5] Out: 500 [Urine:500]   Intake/Output this shift:  Total I/O In: 240 [P.O.:240] Out: 200 [Urine:200]  Physical Exam: General: No acute distress  Head: Normocephalic, atraumatic. Moist oral mucosal membranes  Eyes: Anicteric  Neck: Supple, trachea midline  Lungs:  Clear to auscultation, normal effort  Heart: S1S2 no rubs  Abdomen:  Soft, nontender, bowel sounds present  Extremities: Trace peripheral edema.  Neurologic: Awake, alert, following commands  Skin: No lesions       Basic Metabolic Panel: Recent Labs  Lab 09/27/18 0417 09/27/18 1418 09/28/18 0541 09/29/18 0556  NA 141  --  142 136  K 3.4*  --  3.5 4.2  CL 103  --  106 99  CO2 25  --  27 26  GLUCOSE 302*  --  80 344*  BUN 31*  --  43* 60*  CREATININE 1.89* 1.89* 3.27* 3.57*  CALCIUM 10.1  --  8.7* 8.5*    Liver Function Tests: Recent Labs  Lab 09/27/18 0417  AST 27  ALT 14  ALKPHOS 172*  BILITOT 0.9  PROT 8.1  ALBUMIN 4.2   Recent Labs  Lab 09/27/18 0417  LIPASE 25   No results for input(s): AMMONIA in the last 168 hours.  CBC: Recent Labs  Lab 09/27/18 0417 09/27/18 1418 09/28/18 0541  WBC 11.3*  12.5* 12.0*  NEUTROABS 9.3*  --   --   HGB 15.4* 12.7 12.3  HCT 44.5 38.7 38.7  MCV 78.2* 81.0 84.1  PLT 237 207 204    Cardiac Enzymes: Recent Labs  Lab 09/27/18 0417 09/27/18 0859 09/27/18 1418 09/27/18 2013  TROPONINI 0.11* 0.19* 0.17* 0.22*    BNP: Invalid input(s): POCBNP  CBG: Recent Labs  Lab 09/28/18 0839 09/28/18 1130 09/28/18 1640 09/28/18 2059 09/29/18 0734  GLUCAP 83 259* 224* 356* 298*    Microbiology: Results for orders placed or performed during the hospital encounter of 09/27/18  Urine Culture     Status: None   Collection Time: 09/27/18  4:17 AM  Result Value Ref Range Status   Specimen Description   Final    URINE, CATHETERIZED Performed at [facility], [address], [place], [place] [address]    Special Requests   Final    Normal Performed at [facility], [address], [place], [place] [address]    Culture   Final    NO GROWTH Performed at [facility], [place] [address], [place], [place] [address]    Report Status 09/28/2018 FINAL  Final  SARS Coronavirus 2 (CEPHEID - Performed in [place] hospital lab), Hosp Order     Status:  None   Collection Time: 09/27/18  4:43 AM  Result Value Ref Range Status   SARS Coronavirus 2 NEGATIVE NEGATIVE Final    Comment: (NOTE) If result is NEGATIVE SARS-CoV-2 target nucleic acids are NOT DETECTED. The SARS-CoV-2 RNA is generally detectable in upper and lower  respiratory specimens during the acute phase of infection. The lowest  concentration of SARS-CoV-2 viral copies this assay can detect is 250  copies / mL. A negative result does not preclude SARS-CoV-2 infection  and should not be used as the sole basis for treatment or other  patient management decisions.  A negative result Pha occur with  improper specimen collection / handling, submission of specimen other  than nasopharyngeal swab, presence of viral mutation(s) within the  areas targeted by this assay, and  inadequate number of viral copies  (<250 copies / mL). A negative result must be combined with clinical  observations, patient history, and epidemiological information. If result is POSITIVE SARS-CoV-2 target nucleic acids are DETECTED. The SARS-CoV-2 RNA is generally detectable in upper and lower  respiratory specimens dur ing the acute phase of infection.  Positive  results are indicative of active infection with SARS-CoV-2.  Clinical  correlation with patient history and other diagnostic information is  necessary to determine patient infection status.  Positive results do  not rule out bacterial infection or co-infection with other viruses. If result is PRESUMPTIVE POSTIVE SARS-CoV-2 nucleic acids Dedman BE PRESENT.   A presumptive positive result was obtained on the submitted specimen  and confirmed on repeat testing.  While 2019 novel coronavirus  (SARS-CoV-2) nucleic acids [place] be present in the submitted sample  additional confirmatory testing [place] be necessary for epidemiological  and / or clinical management purposes  to differentiate between  SARS-CoV-2 and other Sarbecovirus currently known to infect humans.  If clinically indicated additional testing with an alternate test  methodology [phone]) is advised. The SARS-CoV-2 RNA is generally  detectable in upper and lower respiratory sp ecimens during the acute  phase of infection. The expected result is Negative. Fact Sheet for Patients:  StrictlyIdeas.no Fact Sheet for Healthcare Providers: BankingDealers.co.za This test is not yet approved or cleared by the [place] FDA and has been authorized for detection and/or diagnosis of SARS-CoV-2 by FDA under an Emergency Use Authorization (EUA).  This EUA will remain in effect (meaning this test can be used) for the duration of the COVID-19 declaration under Section 564(b)(1) of the Act, 21 U.S.C. section 360bbb-3(b)(1), unless the  authorization is terminated or revoked sooner. Performed at [facility], [address] [place], [place] [address]     Coagulation Studies: No results for input(s): LABPROT, INR in the last 72 hours.  Urinalysis: Recent Labs    09/27/18 0417  COLORURINE YELLOW*  LABSPEC 1.020  PHURINE 7.0  GLUCOSEU >=500*  HGBUR SMALL*  BILIRUBINUR NEGATIVE  KETONESUR 5*  PROTEINUR >=300*  NITRITE NEGATIVE  LEUKOCYTESUR TRACE*      Imaging: US Renal  Result Date: 09/28/2018 CLINICAL DATA:  Acute renal failure EXAM: RENAL / URINARY TRACT ULTRASOUND COMPLETE COMPARISON:  CT scan [place] 16, 2020 FINDINGS: Right Kidney: Renal measurements: Acute renal failure = volume: 9.1 x 4.8 x 5 cm mL. One hundred fifteen Left Kidney: Renal measurements: 5.8 by 4.1 x 3.8 cm = volume: 47 mL. Increased cortical echogenicity and cortical thinning. Bladder: Appears normal for degree of bladder distention. IMPRESSION: The left kidney is atrophic compared to the right, correlating with recent CT findings. No cause  for acute renal failure noted. Electronically Signed   By: Dorise Bullion III M.D   On: 09/28/2018 10:58     Medications:   . sodium chloride    . sodium chloride 75 mL/hr at 09/29/18 0600   . amLODipine  10 mg Oral Daily  . aspirin EC  81 mg Oral Daily  . atorvastatin  80 mg Oral Daily  . cholecalciferol  1,000 Units Oral Daily  . docusate sodium  200 mg Oral BID  . gabapentin  300 mg Oral BID  . heparin  5,000 Units Subcutaneous Q8H  . insulin aspart  0-5 Units Subcutaneous QHS  . insulin aspart  0-9 Units Subcutaneous TID WC  . insulin glargine  36 Units Subcutaneous Daily  . levothyroxine  175 mcg Oral Q0600  . mouth rinse  15 mL Mouth Rinse BID  . nitroGLYCERIN  0.5 inch Topical Q6H  . PARoxetine  40 mg Oral Daily  . polyethylene glycol  17 g Oral Daily  . sodium chloride flush  3 mL Intravenous Q12H  . vitamin B-12  5,000 mcg Oral Daily   sodium chloride, acetaminophen,  albuterol, hydrALAZINE, ipratropium, labetalol, morphine injection, ondansetron **OR** ondansetron (ZOFRAN) IV, phenol, sodium chloride flush, traZODone  Assessment/ Plan:  73 y.o. female with a PMHx of Congestive heart failure, hypertension, hypothyroidism, diabetes mellitus2, nonalcoholic fatty liver disease, obesity, peptic ulcer disease, chronic kidney disease stage IV who was admitted for abdominal pain, nausea.  Now found to have acute renal failure.  1.  Acute renal failure/chronic kidney disease stage IV baseline EGFR 26/diabetes mellitus type 2 with chronic kidney disease.  Patient has history of known atrophic kidney previously.  Agree with IV fluid hydration.  Check renal ultrasound now to make sure there is no underlying obstruction.  No urgent indication for dialysis however this [place] need to be considered if renal function continues to deteriorate.  2.  Hypertension.  Maintain the patient on amlodipine for blood pressure control.   LOS: 2 Tyde Lamison 5/18/202012:10 PM

## 2018-09-29 NOTE — Progress Notes (Signed)
Inpatient Diabetes Program Recommendations  AACE/ADA: New Consensus Statement on Inpatient Glycemic Control   Target Ranges:  Prepandial:   less than 140 mg/dL      Peak postprandial:   less than 180 mg/dL (1-2 hours)      Critically ill patients:  140 - 180 mg/dL  Results for Andrea [name], Andrea [name] (MRN [id number]) as of 09/29/2018 09:29  Ref. Range 09/28/2018 08:39 09/28/2018 11:30 09/28/2018 16:40 09/28/2018 20:59 09/29/2018 07:34  Glucose-Capillary Latest Ref Range: 70 - 99 mg/dL 83 259 (H) 224 (H) 356 (H) 298 (H)    Review of Glycemic Control  Diabetes history: DM2 Outpatient Diabetes medications: Toujeo 70 units daily, Novolog 0-12 units TID with meals Current orders for Inpatient glycemic control: Lantus 36 units daily, Novolog 0-9 units TID with meals, Novolog 0-5 units QHS  Inpatient Diabetes Program Recommendations:   Insulin-Basal: Noted Lantus was increased from 30 to 36 units daily today.  Insulin - Meal Coverage: Post prandial consistently elevated on 09/28/18. Please consider ordering Novolog 4 units TID with meals for meal coverage if patient eats at least 50% of meals.  Thanks, Barnie Alderman, RN, MSN, CDE Diabetes Coordinator Inpatient Diabetes Program [phone] (Team Pager from 8am to 5pm)

## 2018-09-30 LAB — RENAL FUNCTION PANEL
Albumin: 3.1 g/dL — ABNORMAL LOW (ref 3.5–5.0)
Anion gap: 9 (ref 5–15)
BUN: 51 mg/dL — ABNORMAL HIGH (ref 8–23)
CO2: 23 mmol/L (ref 22–32)
Calcium: 8.9 mg/dL (ref 8.9–10.3)
Chloride: 107 mmol/L (ref 98–111)
Creatinine, Ser: 2.28 mg/dL — ABNORMAL HIGH (ref 0.44–1.00)
GFR calc Af Amer: 24 mL/min — ABNORMAL LOW (ref >60)
GFR calc non Af Amer: 21 mL/min — ABNORMAL LOW (ref >60)
Glucose, Bld: 355 mg/dL — ABNORMAL HIGH (ref 70–99)
Phosphorus: 3.3 mg/dL (ref 2.5–4.6)
Potassium: 4.5 mmol/L (ref 3.5–5.1)
Sodium: 139 mmol/L (ref 135–145)

## 2018-09-30 LAB — PROTEIN ELECTRO, RANDOM URINE
Albumin ELP, Urine: 68.8 %
Alpha-1-Globulin, U: 3.8 %
Alpha-2-Globulin, U: 7.6 %
Beta Globulin, U: 11.9 %
Gamma Globulin, U: 8 %
Total Protein, Urine: 161.4 mg/dL

## 2018-09-30 LAB — PROTEIN ELECTROPHORESIS, SERUM
A/G Ratio: 1.1 (ref 0.7–1.7)
Albumin ELP: 3.2 g/dL (ref 2.9–4.4)
Alpha-1-Globulin: 0.1 g/dL (ref 0.0–0.4)
Alpha-2-Globulin: 1 g/dL (ref 0.4–1.0)
Beta Globulin: 0.9 g/dL (ref 0.7–1.3)
Gamma Globulin: 0.9 g/dL (ref 0.4–1.8)
Globulin, Total: 3 g/dL (ref 2.2–3.9)
Total Protein ELP: 6.2 g/dL (ref 6.0–8.5)

## 2018-09-30 LAB — C3 COMPLEMENT: C3 Complement: 127 mg/dL (ref 82–167)

## 2018-09-30 LAB — GLUCOSE, CAPILLARY
Glucose-Capillary: 278 mg/dL — ABNORMAL HIGH (ref 70–99)
Glucose-Capillary: 262 mg/dL — ABNORMAL HIGH (ref 70–99)
Glucose-Capillary: 350 mg/dL — ABNORMAL HIGH (ref 70–99)
Glucose-Capillary: 400 mg/dL — ABNORMAL HIGH (ref 70–99)

## 2018-09-30 LAB — ANA W/REFLEX IF POSITIVE: Anti Nuclear Antibody (ANA): NEGATIVE

## 2018-09-30 LAB — C4 COMPLEMENT: Complement C4, Body Fluid: 26 mg/dL (ref 14–44)

## 2018-09-30 MED ORDER — HALOPERIDOL LACTATE 5 MG/ML IJ SOLN
5.0000 mg | Freq: Once | INTRAMUSCULAR | Status: AC
  Administered 2018-09-30: 5 mg via INTRAVENOUS
  Filled 2018-09-30: qty 1

## 2018-09-30 MED ORDER — INSULIN ASPART 100 UNIT/ML ~~LOC~~ SOLN
5.0000 [IU] | Freq: Three times a day (TID) | SUBCUTANEOUS | Status: DC
  Administered 2018-09-30 – 2018-10-03 (×6): 5 [IU] via SUBCUTANEOUS
  Filled 2018-09-30 (×7): qty 1

## 2018-09-30 MED ORDER — INSULIN GLARGINE 100 UNIT/ML ~~LOC~~ SOLN
40.0000 [IU] | Freq: Every day | SUBCUTANEOUS | Status: DC
  Administered 2018-10-01 – 2018-10-03 (×2): 40 [IU] via SUBCUTANEOUS
  Filled 2018-09-30 (×3): qty 0.4

## 2018-09-30 MED ORDER — METOCLOPRAMIDE HCL 5 MG/ML IJ SOLN
10.0000 mg | Freq: Once | INTRAMUSCULAR | Status: AC
  Administered 2018-09-30: 10 mg via INTRAVENOUS
  Filled 2018-09-30: qty 2

## 2018-09-30 MED ORDER — LABETALOL HCL 5 MG/ML IV SOLN
10.0000 mg | INTRAVENOUS | Status: DC | PRN
  Administered 2018-10-02: 10 mg via INTRAVENOUS
  Filled 2018-09-30: qty 4

## 2018-09-30 MED ORDER — HYDRALAZINE HCL 20 MG/ML IJ SOLN
10.0000 mg | INTRAMUSCULAR | Status: DC | PRN
  Administered 2018-10-01 – 2018-10-02 (×2): 10 mg via INTRAVENOUS
  Filled 2018-09-30 (×2): qty 1

## 2018-09-30 MED ORDER — HYDRALAZINE HCL 25 MG PO TABS
25.0000 mg | ORAL_TABLET | Freq: Three times a day (TID) | ORAL | Status: DC
  Administered 2018-09-30 – 2018-10-02 (×5): 25 mg via ORAL
  Filled 2018-09-30 (×7): qty 1

## 2018-09-30 NOTE — TOC Initial Note (Addendum)
Transition of Care [facility]) - Initial/Assessment Note    Patient Details  Name: Andrea [name] MRN: [id number] Date of Birth: 05-27-1945  Transition of Care [facility]) CM/SW Contact:    Katrina Stack, RN Phone Number: 09/30/2018, 5:26 PM  Clinical Narrative:                Admitted with abdominal pain from home. Lives with husband. Nephrology following for acute on chronic stage IV kidney disease. At baseline she only ambulates very short distances at home. This has been on going for the past several years due to severe back pain. has had an injection in her back once but "it did no good." Husband does not think patient has ever been seen by orthopedist regarding her back pain.  patient has a walker that she never uses and a wheelchair.  Some days she washes herself off and others husband has to perform the personal care. Husband says that patient would not ever agree to go to SNF and he would not want her to go unless it is absolutely necessary and  patient can be helped. Patient would be agreeable to have home health come to the home. Husband says however, she does not always let them into the house.  She does not perform her home exercise program in between home visits.  Says "unable to participate with therapy due to her severe back pain.  No agency preference for home health. [facility] has seen in the past but unable to accept the [employer].  Heads up referral to [facility] for PT OT SW. Husband is primary caregiver and must meet most of patient physical care needs. Referred husband to medicare.gov since he can not visit patient for [facility] Compare.  There is a social work consult present for medication assistance.  Husband denies that there are issues with obtainng meds other than "there are a lot of them."  Patient informed CM that her back has not hurt her at all since admitted but "my stomach is killing me and I am so nauseated." She is agreeable to home health.   Expected Discharge Plan: [place] Barriers to Discharge: No Barriers Identified   Patient Goals and CMS Choice Patient states their goals for this hospitalization and ongoing recovery are:: Go home CMS Medicare.gov Compare Post Acute Care list provided to:: Other (Comment Required)(Referred husband to [place]. No visitors. patient not focused) Choice offered to / list presented to : Spouse  Expected Discharge Plan and Services Expected Discharge Plan: [place]   Discharge Planning Services: CM Consult Post Acute Care Choice: [place] arrangements for the past 2 months: Single Family Home                                      Prior Living Arrangements/Services Living arrangements for the past 2 months: Single Family Home Lives with:: Spouse Patient language and need for interpreter reviewed:: No Do you feel safe going back to the place where you live?: Yes          Current home services: DME Criminal Activity/Legal Involvement Pertinent to Current Situation/Hospitalization: No - Comment as needed  Activities of Daily Living Home Assistive Devices/Equipment: Other (Comment)(Bedside Commode) ADL Screening (condition at time of admission) Patient's cognitive ability adequate to safely complete daily activities?: No Is the patient deaf or have difficulty hearing?: Yes Does  the patient have difficulty seeing, even when wearing glasses/contacts?: Yes Does the patient have difficulty concentrating, remembering, or making decisions?: Yes Patient able to express need for assistance with ADLs?: No Does the patient have difficulty dressing or bathing?: Yes Independently performs ADLs?: No Communication: Needs assistance Is this a change from baseline?: Pre-admission baseline Dressing (OT): Needs assistance Is this a change from baseline?: Pre-admission baseline Grooming: Dependent Is this a change from baseline?: Pre-admission baseline Feeding:  Independent Bathing: Dependent Is this a change from baseline?: Pre-admission baseline Toileting: Needs assistance Is this a change from baseline?: Pre-admission baseline In/Out Bed: Needs assistance Is this a change from baseline?: Pre-admission baseline Walks in Home: Needs assistance Is this a change from baseline?: Pre-admission baseline Does the patient have difficulty walking or climbing stairs?: Yes Weakness of Legs: Both Weakness of Arms/Hands: Both  Permission Sought/Granted   Permission granted to share information with : Yes, Verbal Permission Granted     Permission granted to share info w AGENCY: Home Health Agency        Emotional Assessment Appearance:: Disheveled Attitude/Demeanor/Rapport: Lethargic, Gracious Affect (typically observed): Accepting Orientation: : Oriented to Self, Oriented to  Time, Oriented to Place Alcohol / Substance Use: Not Applicable    Admission diagnosis:  Generalized abdominal pain [R10.84] Uncontrolled hypertension [I10] Elevated troponin I level [R79.89] Urinary tract infection without hematuria, site unspecified [N39.0] Chronic kidney disease, unspecified CKD stage [N18.9] Patient Active Problem List   Diagnosis Date Noted  . Abdominal pain 09/27/2018  . Drug overdose 03/08/2017  . Altered mental status   . Encephalopathy acute 01/05/2017  . CHF (congestive heart failure) ([place]) 06/18/2016  . GERD (gastroesophageal reflux disease) 03/07/2013  . Other and unspecified hyperlipidemia 03/07/2013  . Dizziness and giddiness 11/21/2012  . Urinary incontinence 09/11/2012  . Chronic low back pain 07/30/2012  . Anemia 07/30/2012  . Noncompliance 07/30/2012  . Neuropathy 01/12/2012  . Carotid stenosis, right 11/29/2011  . Diabetes mellitus type 2, uncontrolled ([place]) 11/29/2011  . Hypertension 11/29/2011  . Renal artery stenosis ([place]) 06/04/2011  . Constipation, chronic 06/04/2011  . Hypothyroidism 04/11/2011   PCP:  Aline August, MD Pharmacy:   [facility] [address] [place] [facility] [place] [address] [place] [address] Phone: [phone] Fax: [phone]     Social Determinants of Health (SDOH) Interventions    Readmission Risk Interventions No flowsheet data found.

## 2018-09-30 NOTE — Progress Notes (Signed)
[place] at [facility]                                                                                                                                                                                  Patient Demographics   Andrea [name], is a 73 y.o. female, DOB - December 12, 1945, BTD:[id number]  Admit date - 09/27/2018   Admitting Physician Dustin Flock, MD  Outpatient Primary MD for the patient is Aline August, MD   LOS - 3  Subjective:  Patient feeling better renal function is improved today   Review of Systems:   CONSTITUTIONAL: No documented fever. No fatigue, weakness. No weight gain, no weight loss.  EYES: No blurry or double vision.  ENT: No tinnitus. No postnasal drip. No redness of the oropharynx.  RESPIRATORY: No cough, no wheeze, no hemoptysis. No dyspnea.  CARDIOVASCULAR: No chest pain. No orthopnea. No palpitations. No syncope.  GASTROINTESTINAL: Positive nausea, no vomiting or diarrhea. No abdominal pain. No melena or hematochezia.  GENITOURINARY: No dysuria or hematuria.  ENDOCRINE: No polyuria or nocturia. No heat or cold intolerance.  HEMATOLOGY: No anemia. No bruising. No bleeding.  INTEGUMENTARY: No rashes. No lesions.  MUSCULOSKELETAL: No arthritis. No swelling. No gout.  NEUROLOGIC: No numbness, tingling, or ataxia. No seizure-type activity.  PSYCHIATRIC: No anxiety. No insomnia. No ADD.    Vitals:   Vitals:   09/30/18 0423 09/30/18 0748 09/30/18 0902 09/30/18 1200  BP:  (!) 194/97 (!) 176/78 (!) 154/75  Pulse:  88 94 90  Resp:      Temp:  99.2 F (37.3 C)  98.1 F (36.7 C)  TempSrc:  Oral  Oral  SpO2:  97%    Weight: 78.5 kg     Height:        Wt Readings from Last 3 Encounters:  09/30/18 78.5 kg  03/08/17 84.4 kg  01/05/17 89.4 kg     Intake/Output Summary (Last 24 hours) at 09/30/2018 1324 Last data filed at 09/29/2018 1417 Gross per 24 hour  Intake 557.85 ml  Output 0 ml  Net 557.85 ml    Physical Exam:    GENERAL: Pleasant-appearing in no apparent distress.  HEAD, EYES, EARS, NOSE AND THROAT: Atraumatic, normocephalic. Extraocular muscles are intact. Pupils equal and reactive to light. Sclerae anicteric. No conjunctival injection. No oro-pharyngeal erythema.  NECK: Supple. There is no jugular venous distention. No bruits, no lymphadenopathy, no thyromegaly.  HEART: Regular rate and rhythm,. No murmurs, no rubs, no clicks.  LUNGS: Clear to auscultation bilaterally. No rales or rhonchi. No wheezes.  ABDOMEN: Soft, flat, nontender, nondistended. Has good bowel sounds. No hepatosplenomegaly appreciated.  EXTREMITIES: No evidence of  any cyanosis, clubbing, or peripheral edema.  +2 pedal and radial pulses bilaterally.  NEUROLOGIC: The patient is alert, awake, and oriented x3 with no focal motor or sensory deficits appreciated bilaterally.  SKIN: Moist and warm with no rashes appreciated.  Psych: Not anxious, depressed LN: No inguinal LN enlargement    Antibiotics   Anti-infectives (From admission, onward)   Start     Dose/Rate Route Frequency Ordered Stop   09/28/18 0800  cefTRIAXone (ROCEPHIN) 1 g in sodium chloride 0.9 % 100 mL IVPB  Status:  Discontinued     1 g 200 mL/hr over 30 Minutes Intravenous Every 24 hours 09/27/18 0822 09/29/18 0921   09/27/18 0645  cefTRIAXone (ROCEPHIN) 1 g in sodium chloride 0.9 % 100 mL IVPB     1 g 200 mL/hr over 30 Minutes Intravenous STAT 09/27/18 0631 09/27/18 0747      Medications   Scheduled Meds: . amLODipine  10 mg Oral Daily  . aspirin EC  81 mg Oral Daily  . atorvastatin  80 mg Oral Daily  . cholecalciferol  1,000 Units Oral Daily  . docusate sodium  200 mg Oral BID  . gabapentin  300 mg Oral BID  . heparin  5,000 Units Subcutaneous Q8H  . insulin aspart  0-5 Units Subcutaneous QHS  . insulin aspart  0-9 Units Subcutaneous TID WC  . insulin glargine  36 Units Subcutaneous Daily  . levothyroxine  175 mcg Oral Q0600  . mouth rinse  15 mL  Mouth Rinse BID  . nitroGLYCERIN  0.5 inch Topical Q6H  . PARoxetine  40 mg Oral Daily  . polyethylene glycol  17 g Oral Daily  . sodium chloride flush  3 mL Intravenous Q12H  . vitamin B-12  5,000 mcg Oral Daily   Continuous Infusions: . sodium chloride    . sodium chloride 75 mL/hr at 09/30/18 0356   PRN Meds:.sodium chloride, acetaminophen, albuterol, hydrALAZINE, ipratropium, labetalol, morphine injection, ondansetron **OR** ondansetron (ZOFRAN) IV, phenol, prochlorperazine, sodium chloride flush, traZODone   Data Review:   Micro Results Recent Results (from the past 240 hour(s))  Urine Culture     Status: None   Collection Time: 09/27/18  4:17 AM  Result Value Ref Range Status   Specimen Description   Final    URINE, CATHETERIZED Performed at [facility], [address] [place], [place] [address]    Special Requests   Final    Normal Performed at [facility], [address], [place], [place] [address]    Culture   Final    NO GROWTH Performed at [facility], [place] [address], [place], [place] [address]    Report Status 09/28/2018 FINAL  Final  SARS Coronavirus 2 (CEPHEID - Performed in [place] hospital lab), Hosp Order     Status: None   Collection Time: 09/27/18  4:43 AM  Result Value Ref Range Status   SARS Coronavirus 2 NEGATIVE NEGATIVE Final    Comment: (NOTE) If result is NEGATIVE SARS-CoV-2 target nucleic acids are NOT DETECTED. The SARS-CoV-2 RNA is generally detectable in upper and lower  respiratory specimens during the acute phase of infection. The lowest  concentration of SARS-CoV-2 viral copies this assay can detect is 250  copies / mL. A negative result does not preclude SARS-CoV-2 infection  and should not be used as the sole basis for treatment or other  patient management decisions.  A negative result [place] occur with  improper specimen collection / handling,  submission of specimen other  than nasopharyngeal swab,  presence of viral mutation(s) within the  areas targeted by this assay, and inadequate number of viral copies  (<250 copies / mL). A negative result must be combined with clinical  observations, patient history, and epidemiological information. If result is POSITIVE SARS-CoV-2 target nucleic acids are DETECTED. The SARS-CoV-2 RNA is generally detectable in upper and lower  respiratory specimens dur ing the acute phase of infection.  Positive  results are indicative of active infection with SARS-CoV-2.  Clinical  correlation with patient history and other diagnostic information is  necessary to determine patient infection status.  Positive results do  not rule out bacterial infection or co-infection with other viruses. If result is PRESUMPTIVE POSTIVE SARS-CoV-2 nucleic acids [place] BE PRESENT.   A presumptive positive result was obtained on the submitted specimen  and confirmed on repeat testing.  While 2019 novel coronavirus  (SARS-CoV-2) nucleic acids [place] be present in the submitted sample  additional confirmatory testing [place] be necessary for epidemiological  and / or clinical management purposes  to differentiate between  SARS-CoV-2 and other Sarbecovirus currently known to infect humans.  If clinically indicated additional testing with an alternate test  methodology [phone]) is advised. The SARS-CoV-2 RNA is generally  detectable in upper and lower respiratory sp ecimens during the acute  phase of infection. The expected result is Negative. Fact Sheet for Patients:  StrictlyIdeas.no Fact Sheet for Healthcare Providers: BankingDealers.co.za This test is not yet approved or cleared by the [place] FDA and has been authorized for detection and/or diagnosis of SARS-CoV-2 by FDA under an Emergency Use Authorization (EUA).  This EUA will remain in effect (meaning this test can be used) for the duration of the COVID-19 declaration under  Section 564(b)(1) of the Act, 21 U.S.C. section 360bbb-3(b)(1), unless the authorization is terminated or revoked sooner. Performed at [facility], [address], [place], [place] [address]     Radiology Reports Ct Abdomen Pelvis Wo Contrast  Result Date: 09/27/2018 CLINICAL DATA:  Nausea/vomiting, abdominal pain, UTI. EXAM: CT ABDOMEN AND PELVIS WITHOUT CONTRAST TECHNIQUE: Multidetector CT imaging of the abdomen and pelvis was performed following the standard protocol without IV contrast. COMPARISON:  05/19/2011 FINDINGS: Lower chest: Mosaic attenuation at the lung bases. Hepatobiliary: Nodular hepatic contour, raising the possibility of cirrhosis. Status post cholecystectomy. No intrahepatic or extrahepatic duct dilatation. Pancreas: Within normal limits. Spleen: Normal in size. Adrenals/Urinary Tract: Adrenal glands are within normal limits. Left renal atrophy. Right kidney is within normal limits. No renal, ureteral, or bladder calculi. No hydronephrosis. Bladder is within normal limits. Stomach/Bowel: Stomach is notable for a tiny hiatal hernia. No evidence of bowel obstruction. Appendix is not discretely visualized and Lapierre be surgically absent. Mild rectal wall thickening (series 2/image 77), without inflammatory changes, favoring underdistention. Vascular/Lymphatic: No evidence of abdominal aortic aneurysm. Atherosclerotic calcifications of the abdominal aorta and branch vessels. No suspicious abdominopelvic lymphadenopathy. Reproductive: Status post hysterectomy.  No adnexal masses. Other: No abdominopelvic ascites. Musculoskeletal: Mild degenerative changes of the lower thoracic spine. IMPRESSION: No evidence of bowel obstruction. Appendix is not discretely visualized and Coltrane be surgically absent. No CT findings to account for the patient's abdominal pain. Possible cirrhosis. Left renal atrophy. Status post cholecystectomy and hysterectomy. Electronically Signed   By: Julian Hy M.D.   On: 09/27/2018 06:10   US Renal  Result Date: 09/28/2018 CLINICAL DATA:  Acute renal failure EXAM: RENAL / URINARY TRACT ULTRASOUND COMPLETE COMPARISON:  CT scan  [place] 16, 2020 FINDINGS: Right Kidney: Renal measurements: Acute renal failure = volume: 9.1 x 4.8 x 5 cm mL. One hundred fifteen Left Kidney: Renal measurements: 5.8 by 4.1 x 3.8 cm = volume: 47 mL. Increased cortical echogenicity and cortical thinning. Bladder: Appears normal for degree of bladder distention. IMPRESSION: The left kidney is atrophic compared to the right, correlating with recent CT findings. No cause for acute renal failure noted. Electronically Signed   By: Dorise Bullion III M.D   On: 09/28/2018 10:58     CBC Recent Labs  Lab 09/27/18 0417 09/27/18 1418 09/28/18 0541  WBC 11.3* 12.5* 12.0*  HGB 15.4* 12.7 12.3  HCT 44.5 38.7 38.7  PLT 237 207 204  MCV 78.2* 81.0 84.1  MCH 27.1 26.6 26.7  MCHC 34.6 32.8 31.8  RDW 14.3 14.5 14.8  LYMPHSABS 1.4  --   --   MONOABS 0.4  --   --   EOSABS 0.0  --   --   BASOSABS 0.1  --   --     Chemistries  Recent Labs  Lab 09/27/18 0417 09/27/18 1418 09/28/18 0541 09/29/18 0556 09/30/18 0505  NA 141  --  142 136 139  K 3.4*  --  3.5 4.2 4.5  CL 103  --  106 99 107  CO2 25  --  27 26 23   GLUCOSE 302*  --  80 344* 355*  BUN 31*  --  43* 60* 51*  CREATININE 1.89* 1.89* 3.27* 3.57* 2.28*  CALCIUM 10.1  --  8.7* 8.5* 8.9  AST 27  --   --   --   --   ALT 14  --   --   --   --   ALKPHOS 172*  --   --   --   --   BILITOT 0.9  --   --   --   --    ------------------------------------------------------------------------------------------------------------------ estimated creatinine clearance is 21.2 mL/min (A) (by C-G formula based on SCr of 2.28 mg/dL (H)). ------------------------------------------------------------------------------------------------------------------ No results for input(s): HGBA1C in the last 72  hours. ------------------------------------------------------------------------------------------------------------------ No results for input(s): CHOL, HDL, LDLCALC, TRIG, CHOLHDL, LDLDIRECT in the last 72 hours. ------------------------------------------------------------------------------------------------------------------ No results for input(s): TSH, T4TOTAL, T3FREE, THYROIDAB in the last 72 hours.  Invalid input(s): FREET3 ------------------------------------------------------------------------------------------------------------------ No results for input(s): VITAMINB12, FOLATE, FERRITIN, TIBC, IRON, RETICCTPCT in the last 72 hours.  Coagulation profile No results for input(s): INR, PROTIME in the last 168 hours.  No results for input(s): DDIMER in the last 72 hours.  Cardiac Enzymes Recent Labs  Lab 09/27/18 0859 09/27/18 1418 09/27/18 2013  TROPONINI 0.19* 0.17* 0.22*   ------------------------------------------------------------------------------------------------------------------ Invalid input(s): POCBNP    Assessment & Plan   IMPRESSION AND PLAN: Patient 73 year old female with history of dementia presenting with abdominal pain  1.  Abdominal pain due to constipation and gas  Continue supportive care  2.  Acute renal failure on chronic kidney disease  Renal functions improved appreciate nephrology input   3.  Accelerated hypertension there is a lot of fluctuation in the blood pressure, I will place patient on a small dose hydralazine   4.  Elevated troponin suspect this is due to demand ischemia we will follow troponin levels cardiology consult if troponin trends upward  5.  Chronic diastolic CHF currently compensated continue to monitor  6.  Diabetes type 2 with elevated blood sugar adjust her insulin as recommended by diabetic coordinator  7.  Dementia monitor mental status  Code Status Orders  (From admission, onward)         Start      Ordered   09/27/18 1207  Full code  Continuous     09/27/18 1207        Code Status History    Date Active Date Inactive Code Status Order ID Comments User Context   03/08/2017 0309 03/09/2017 1455 Full Code [id number]  Vaughan Basta, MD Inpatient   01/05/2017 1629 01/11/2017 1802 Full Code [id number]  Nicholes Mango, MD ED   06/18/2016 0329 06/19/2016 1954 Full Code [id number]  Saundra Shelling, MD Inpatient    Advance Directive Documentation     Most Recent Value  Type of Advance Directive  Living will  Pre-existing out of facility DNR order (yellow form or pink MOST form)  -  "MOST" Form in Place?  -           Consults none  DVT Prophylaxis  Lovenox  Lab Results  Component Value Date   PLT 204 09/28/2018     Time Spent in minutes 35-minute greater than 50% of time spent in care coordination and counseling patient regarding the condition and plan of care.   Dustin Flock M.D on 09/30/2018 at 1:24 PM  Between 7am to 6pm - Pager - [phone]  After 6pm go to [email or web address] - Proofreader  [employer]   Office  [phone]

## 2018-09-30 NOTE — Progress Notes (Signed)
Inpatient Diabetes Program Recommendations  AACE/ADA: New Consensus Statement on Inpatient Glycemic Control   Target Ranges:  Prepandial:   less than 140 mg/dL      Peak postprandial:   less than 180 mg/dL (1-2 hours)      Critically ill patients:  140 - 180 mg/dL   Results for Andrea [name], Andrea [name] (MRN [id number]) as of 09/30/2018 08:53  Ref. Range 09/29/2018 07:34 09/29/2018 12:10 09/29/2018 16:19 09/29/2018 21:03 09/30/2018 07:47  Glucose-Capillary Latest Ref Range: 70 - 99 mg/dL 298 (H) 315 (H) 258 (H) 318 (H) 278 (H)   Review of Glycemic Control  Diabetes history: DM2 Outpatient Diabetes medications: Toujeo 70 units daily, Novolog 0-12 units TID with meals Current orders for Inpatient glycemic control: Lantus 36 units daily, Novolog 0-9 units TID with meals, Novolog 0-5 units QHS  Inpatient Diabetes Program Recommendations:   Insulin-Basal: Please consider increasing Lantus to 40 units daily.  Insulin - Meal Coverage: Post prandial glucose is consistently elevated. Please consider ordering Novolog 5 units TID with meals for meal coverage if patient eats at least 50% of meals.  Thanks, Barnie Alderman, RN, MSN, CDE Diabetes Coordinator Inpatient Diabetes Program [phone] (Team Pager from 8am to 5pm)

## 2018-09-30 NOTE — Care Management Important Message (Signed)
Important Message  Patient Details  Name: Andrea [name] MRN: [id number] Date of Birth: Aug 09, 1945   Medicare Important Message Given:  Yes    Dannette Barbara 09/30/2018, 10:44 AM

## 2018-09-30 NOTE — Progress Notes (Addendum)
Pt still c/o increasing nausea with some vomiting after PRN Reglan given. Pt sill anxious and moaning in discomfort. MD made aware., 5 mg haldol ordered. Pt unable to take PO medications dur to vomiting. Pt educated that this nurse will recheck her BP 30-60 minutes after haldol and reassess. PRN labetalol ordered if needed.   Update 23:59: Pt is sleeping with no distress noted. Pt responded to this nurse when aroused. BP 158/76, will continue to monitor.

## 2018-09-30 NOTE — Progress Notes (Signed)
[facility]  ROUNDING NOTE   Subjective:  Renal function appears improved today. Creatinine down to 2.2. Patient lethargic but arousable.   Objective:  Vital signs in last 24 hours:  Temp:  [98 F (36.7 C)-99.5 F (37.5 C)] 98.1 F (36.7 C) (05/19 1200) Pulse Rate:  [80-99] 90 (05/19 1200) Resp:  [18-22] 22 (05/19 0358) BP: (154-210)/(75-99) 154/75 (05/19 1200) SpO2:  [93 %-97 %] 97 % (05/19 0748) Weight:  [78.5 kg] 78.5 kg (05/19 0423)  Weight change: -0.771 kg Filed Weights   09/27/18 0947 09/29/18 0406 09/30/18 0423  Weight: 74.6 kg 79.3 kg 78.5 kg    Intake/Output: I/O last 3 completed shifts: In: 1977.3 [P.O.:480; I.V.:1497.3] Out: 600 [Urine:600]   Intake/Output this shift:  No intake/output data recorded.  Physical Exam: General: No acute distress  Head: Normocephalic, atraumatic. Moist oral mucosal membranes  Eyes: Anicteric  Neck: Supple, trachea midline  Lungs:  Clear to auscultation, normal effort  Heart: S1S2 no rubs  Abdomen:  Soft, nontender, bowel sounds present  Extremities: Trace peripheral edema.  Neurologic: Lethargic but arousable  Skin: No lesions       Basic Metabolic Panel: Recent Labs  Lab 09/27/18 0417 09/27/18 1418 09/28/18 0541 09/29/18 0556 09/30/18 0505  NA 141  --  142 136 139  K 3.4*  --  3.5 4.2 4.5  CL 103  --  106 99 107  CO2 25  --  _0 GLUCOSE 302*  --  80 344* 355*  BUN 31*  --  43* 60* 51*  CREATININE 1.89* 1.89* 3.27* 3.57* 2.28*  CALCIUM 10.1  --  8.7* 8.5* 8.9  PHOS  --   --   --   --  3.3    Liver Function Tests: Recent Labs  Lab 09/27/18 0417 09/30/18 0505  AST 27  --   ALT 14  --   ALKPHOS 172*  --   BILITOT 0.9  --   PROT 8.1  --   ALBUMIN 4.2 3.1*   Recent Labs  Lab 09/27/18 0417  LIPASE 25   No results for input(s): AMMONIA in the last 168 hours.  CBC: Recent Labs  Lab 09/27/18 0417 09/27/18 1418 09/28/18 0541  WBC 11.3* 12.5* 12.0*  NEUTROABS 9.3*  --   --    HGB 15.4* 12.7 12.3  HCT 44.5 38.7 38.7  MCV 78.2* 81.0 84.1  PLT 237 207 204    Cardiac Enzymes: Recent Labs  Lab 09/27/18 0417 09/27/18 0859 09/27/18 1418 09/27/18 2013  TROPONINI 0.11* 0.19* 0.17* 0.22*    BNP: Invalid input(s): POCBNP  CBG: Recent Labs  Lab 09/29/18 1210 09/29/18 1619 09/29/18 2103 09/30/18 0747 09/30/18 1132  GLUCAP 315* 258* 318* 278* 29*    Microbiology: Results for orders placed or performed during the hospital encounter of 09/27/18  Urine Culture     Status: None   Collection Time: 09/27/18  4:17 AM  Result Value Ref Range Status   Specimen Description   Final    URINE, CATHETERIZED Performed at [facility], [address], [place], [place] [address]    Special Requests   Final    Normal Performed at [facility], [address], [place], [place] [address]    Culture   Final    NO GROWTH Performed at [facility], [place] [address] [place], [place] [address]    Report Status 09/28/2018 FINAL  Final  SARS Coronavirus 2 (CEPHEID - Performed in [facility] hospital lab), Hosp Order     Status: None   Collection Time: 09/27/18  4:43 AM  Result Value Ref Range Status   SARS Coronavirus 2 NEGATIVE NEGATIVE Final    Comment: (NOTE) If result is NEGATIVE SARS-CoV-2 target nucleic acids are NOT DETECTED. The SARS-CoV-2 RNA is generally detectable in upper and lower  respiratory specimens during the acute phase of infection. The lowest  concentration of SARS-CoV-2 viral copies this assay can detect is 250  copies / mL. A negative result does not preclude SARS-CoV-2 infection  and should not be used as the sole basis for treatment or other  patient management decisions.  A negative result Dakin occur with  improper specimen collection / handling, submission of specimen other  than nasopharyngeal swab, presence of viral mutation(s) within the  areas targeted by this assay, and inadequate number of viral copies   (<250 copies / mL). A negative result must be combined with clinical  observations, patient history, and epidemiological information. If result is POSITIVE SARS-CoV-2 target nucleic acids are DETECTED. The SARS-CoV-2 RNA is generally detectable in upper and lower  respiratory specimens dur ing the acute phase of infection.  Positive  results are indicative of active infection with SARS-CoV-2.  Clinical  correlation with patient history and other diagnostic information is  necessary to determine patient infection status.  Positive results do  not rule out bacterial infection or co-infection with other viruses. If result is PRESUMPTIVE POSTIVE SARS-CoV-2 nucleic acids [place] BE PRESENT.   A presumptive positive result was obtained on the submitted specimen  and confirmed on repeat testing.  While 2019 novel coronavirus  (SARS-CoV-2) nucleic acids [place] be present in the submitted sample  additional confirmatory testing [place] be necessary for epidemiological  and / or clinical management purposes  to differentiate between  SARS-CoV-2 and other Sarbecovirus currently known to infect humans.  If clinically indicated additional testing with an alternate test  methodology [id number]) is advised. The SARS-CoV-2 RNA is generally  detectable in upper and lower respiratory sp ecimens during the acute  phase of infection. The expected result is Negative. Fact Sheet for Patients:  StrictlyIdeas.no Fact Sheet for Healthcare Providers: BankingDealers.co.za This test is not yet approved or cleared by the [place] FDA and has been authorized for detection and/or diagnosis of SARS-CoV-2 by FDA under an Emergency Use Authorization (EUA).  This EUA will remain in effect (meaning this test can be used) for the duration of the COVID-19 declaration under Section 564(b)(1) of the Act, 21 U.S.C. section 360bbb-3(b)(1), unless the authorization is terminated  or revoked sooner. Performed at [facility], [address] [place], [place] [address]     Coagulation Studies: No results for input(s): LABPROT, INR in the last 72 hours.  Urinalysis: No results for input(s): COLORURINE, LABSPEC, PHURINE, GLUCOSEU, HGBUR, BILIRUBINUR, KETONESUR, PROTEINUR, UROBILINOGEN, NITRITE, LEUKOCYTESUR in the last 72 hours.  Invalid input(s): APPERANCEUR    Imaging: No results found.   Medications:   . sodium chloride    . sodium chloride 75 mL/hr at 09/30/18 0356   . amLODipine  10 mg Oral Daily  . aspirin EC  81 mg Oral Daily  . atorvastatin  80 mg Oral Daily  . cholecalciferol  1,000 Units Oral Daily  . docusate sodium  200 mg Oral BID  . gabapentin  300 mg Oral BID  . heparin  5,000 Units Subcutaneous Q8H  . hydrALAZINE  25 mg Oral Q8H  . insulin aspart  0-5 Units  Subcutaneous QHS  . insulin aspart  0-9 Units Subcutaneous TID WC  . [START ON 10/01/2018] insulin glargine  40 Units Subcutaneous Daily  . levothyroxine  175 mcg Oral Q0600  . mouth rinse  15 mL Mouth Rinse BID  . nitroGLYCERIN  0.5 inch Topical Q6H  . PARoxetine  40 mg Oral Daily  . polyethylene glycol  17 g Oral Daily  . sodium chloride flush  3 mL Intravenous Q12H  . vitamin B-12  5,000 mcg Oral Daily   sodium chloride, acetaminophen, albuterol, hydrALAZINE, ipratropium, labetalol, morphine injection, ondansetron **OR** ondansetron (ZOFRAN) IV, phenol, prochlorperazine, sodium chloride flush, traZODone  Assessment/ Plan:  73 y.o. female with a PMHx of Congestive heart failure, hypertension, hypothyroidism, diabetes mellitus 2, nonalcoholic fatty liver disease, obesity, peptic ulcer disease, chronic kidney disease stage IV who was admitted for abdominal pain, nausea.  Now found to have acute renal failure.  1.  Acute renal failure/chronic kidney disease stage IV baseline EGFR 26/diabetes mellitus type 2 with chronic kidney disease/proteinuria.  Patient with atrophic  left kidney.  Renal function has improved today.  No urgent indication for dialysis.  Continue IV fluid hydration with 0.9 normal saline at 75 cc/h.  Consider stopping this tomorrow.  2.  Hypertension.  Continue amlodipine and hydralazine for now.   LOS: 3   5/19/20201:42 PM

## 2018-09-30 NOTE — Progress Notes (Signed)
Per verbal order from Dr. Posey Pronto, add 5 units meal coverage d/t elevated cbg (per diabetes coordinators recommendation). Order placed. Lupita Leash

## 2018-09-30 NOTE — Progress Notes (Signed)
PT Cancellation Note  Patient Details Name: Andrea Kehm Negrette MRN: [id number] DOB: Feb 01, 1946   Cancelled Treatment:    Reason Eval/Treat Not Completed: Medical issues which prohibited therapy(Noted BP 194/69mmHG, higher over night. Will hold PT evaluation until patient is more medically stable and appropriate. )  8:06 AM, 09/30/18 Etta Grandchild, PT, DPT Physical Therapist - [facility]  [phone] ([place])    [place] C 09/30/2018, 8:06 AM

## 2018-09-30 NOTE — Evaluation (Signed)
Physical Therapy Evaluation Patient Details Name: Andrea [name] MRN: [id number] DOB: 01/26/1946 Today's Date: 09/30/2018   History of Present Illness  Andrea [name] is a 73yo female who comes [facility] on 5/16 after 1 weak ABD pain, nausea, vomitting, eventually thought to be 2/2 constipation and gas. While admitted, pt also noted to have declining kidney function, accelerated HTN, and consistently elevated BG. PMH: dCHF, HTN, hypoTSH, IDDM, Dementia, NASH, CKDIV. At baseline pt reports poor compliance with assistive device, AMB room to room in home, falls 'weekly' now for 'about two years'.   Clinical Impression  Pt admitted with above diagnosis. Pt currently with functional limitations due to the deficits listed below (see "PT Problem List"). Upon entry, pt in bed, appears sedated eyes closed but awakens easily for brief periods. Pt more consistently awake once moved to sitting position. Pt agreeable to participate. The pt is oriented x4, pleasant, conversational, and generally a limited historian. She offers a dense falls history but is unable to provide much detail as to why she falls other than denial of LOC/seizures and affirms poor compliance with AD for AMB in the setting of frequent recommendation for use from husband. When asked what plan is in place to reduce falls, the patient simply says 'I try to walk more.' ModA to EOB. Min-modA to rise the first 3x, then with RW and education pt able to stand 3x c minGuard assist. Stepping strategy is very provocative to standing stability, unclear but apparent substantial Rt lateral trunk flexion for Left side stepping, a common motor compensation in the setting of advanced trendelenburg gait. Functional mobility assessment demonstrates increased effort/time requirements, poor tolerance, and need for physical assistance, whereas the patient performed these at a reportedly higher level of independence PTA. Pt denies any sense of acute weakness or decline in  function. It seems patient is a repeat faller, noncompliant with recommendations for DME use, no evidence of behavioral change from pt or caregivers to reduce falls int he home, and pt exhibiting a low level of concern for future falling events.  Pt is likely near her low level baseline for mobility, which could be potentially made better to a degree, however addressing the noncompliance and supervision issues would be a more meaningful strategy for improving safety in the home. Unfortunately, due to Covid related visitor restrictions, the role and capabilities of the primary caregiver are unclear at this time. Pt will benefit from skilled PT intervention to increase independence and safety with basic mobility in preparation for discharge to the venue listed below.        Follow Up Recommendations Supervision for mobility/OOB;Supervision/Assistance - 24 hour;Home health PT    Equipment Recommendations  None recommended by PT    Recommendations for Other Services       Precautions / Restrictions Precautions Precautions: Fall Precaution Comments: watch BP, BG Restrictions Weight Bearing Restrictions: No      Mobility  Bed Mobility Overal bed mobility: Needs Assistance Bed Mobility: Supine to Sit     Supine to sit: Mod assist     General bed mobility comments: heavy effort to get legs OOB, but needs modA for trunk righting to upright seated, which she is able to maintain.   Transfers Overall transfer level: Needs assistance Equipment used: Rolling walker (2 wheeled);1 person hand held assist Transfers: Sit to/from Stand Sit to Stand: Min assist;Min guard         General transfer comment: poor motor planning for STS with RW, educated on push-up from  arm-rests, which enables her to rise without physical assist.   Ambulation/Gait Ambulation/Gait assistance: (limited capacity for stepping with low confidence and high level of effort required. )           General Gait  Details: will defer until later date; unsafe at this time.  Stairs            Wheelchair Mobility    Modified Rankin (Stroke Patients Only)       Balance Overall balance assessment: History of Falls;Needs assistance Sitting-balance support: No upper extremity supported;Feet supported Sitting balance-Leahy Scale: Good     Standing balance support: Bilateral upper extremity supported;During functional activity Standing balance-Leahy Scale: Fair               High level balance activites: Side stepping(high difficulty, poor balance.)               Pertinent Vitals/Pain Pain Assessment: No/denies pain    Home Living Family/patient expects to be discharged to:: Private residence Living Arrangements: Spouse/significant other(husband) Available Help at Discharge: Family Type of Home: (doublewide) Home Access: Stairs to enter;Ramped entrance   Entrance Stairs-Number of Steps: 5 Home Layout: One level Home Equipment: Walker - 2 wheels;Walker - 4 wheels;Cane - single point;Shower seat;Wheelchair - manual      Prior Function Level of Independence: Needs assistance   Gait / Transfers Assistance Needed: AMB room to room without AD but falls frequently.   ADL's / Homemaking Assistance Needed: Independent in basic ADL; does not perform IADL  Comments: reports being a frequent faller ~1x/week for ~2 years.      Hand Dominance        Extremity/Trunk Assessment   Upper Extremity Assessment Upper Extremity Assessment: Generalized weakness    Lower Extremity Assessment Lower Extremity Assessment: Generalized weakness       Communication      Cognition Arousal/Alertness: Awake/alert Behavior During Therapy: WFL for tasks assessed/performed Overall Cognitive Status: History of cognitive impairments - at baseline(interactive and responsive, intermittently sedated, but once sitting up, remains awake more easily. RN questioning if low renal clearance is  contributory to lethargy. )                                        General Comments      Exercises     Assessment/Plan    PT Assessment Patient needs continued PT services  PT Problem List Decreased strength;Decreased activity tolerance;Decreased cognition;Decreased balance;Decreased mobility;Decreased knowledge of precautions       PT Treatment Interventions DME instruction;Gait training;Functional mobility training;Therapeutic activities;Therapeutic exercise;Balance training;Patient/family education    PT Goals (Current goals can be found in the Care Plan section)  Acute Rehab PT Goals Patient Stated Goal: return to home  PT Goal Formulation: With patient Time For Goal Achievement: 10/14/18 Potential to Achieve Goals: Fair    Frequency Min 2X/week   Barriers to discharge Decreased caregiver support questionable amount of support if patient is falling as frequently as she claims. (Hx of dementia)     Co-evaluation               AM-PAC PT "6 Clicks" Mobility  Outcome Measure Help needed turning from your back to your side while in a flat bed without using bedrails?: A Lot Help needed moving from lying on your back to sitting on the side of a flat bed without using bedrails?: A Lot Help needed  moving to and from a bed to a chair (including a wheelchair)?: A Lot Help needed standing up from a chair using your arms (e.g., wheelchair or bedside chair)?: A Little Help needed to walk in hospital room?: A Lot Help needed climbing 3-5 steps with a railing? : Total 6 Click Score: 12    End of Session Equipment Utilized During Treatment: Gait belt Activity Tolerance: Patient tolerated treatment well;Patient limited by fatigue Patient left: in chair;with chair alarm set;with call bell/phone within reach Nurse Communication: Mobility status PT Visit Diagnosis: Unsteadiness on feet (R26.81);Other abnormalities of gait and mobility (R26.89);Repeated falls  (R29.6);Difficulty in walking, not elsewhere classified (R26.2)    Time: 3143-8887 PT Time Calculation (min) (ACUTE ONLY): 27 min   Charges:   PT Evaluation $PT Eval High Complexity: 1 High PT Treatments $Therapeutic Exercise: 8-22 mins        2:26 PM, 09/30/18 Andrea [name], PT, DPT Physical Therapist - [facility]  [phone] ([place])   [place] C 09/30/2018, 2:15 PM

## 2018-10-01 LAB — GLUCOSE, CAPILLARY
Glucose-Capillary: 305 mg/dL — ABNORMAL HIGH (ref 70–99)
Glucose-Capillary: 206 mg/dL — ABNORMAL HIGH (ref 70–99)
Glucose-Capillary: 193 mg/dL — ABNORMAL HIGH (ref 70–99)
Glucose-Capillary: 176 mg/dL — ABNORMAL HIGH (ref 70–99)

## 2018-10-01 LAB — BASIC METABOLIC PANEL
Anion gap: 9 (ref 5–15)
BUN: 37 mg/dL — ABNORMAL HIGH (ref 8–23)
CO2: 22 mmol/L (ref 22–32)
Calcium: 9 mg/dL (ref 8.9–10.3)
Chloride: 104 mmol/L (ref 98–111)
Creatinine, Ser: 1.94 mg/dL — ABNORMAL HIGH (ref 0.44–1.00)
GFR calc Af Amer: 29 mL/min — ABNORMAL LOW (ref >60)
GFR calc non Af Amer: 25 mL/min — ABNORMAL LOW (ref >60)
Glucose, Bld: 238 mg/dL — ABNORMAL HIGH (ref 70–99)
Potassium: 3.7 mmol/L (ref 3.5–5.1)
Sodium: 135 mmol/L (ref 135–145)

## 2018-10-01 MED ORDER — SODIUM CHLORIDE 0.9 % IV SOLN
INTRAVENOUS | Status: DC
  Administered 2018-10-02: 11:00:00 via INTRAVENOUS

## 2018-10-01 MED ORDER — PANTOPRAZOLE SODIUM 40 MG IV SOLR
40.0000 mg | Freq: Two times a day (BID) | INTRAVENOUS | Status: DC
  Administered 2018-10-01 – 2018-10-03 (×4): 40 mg via INTRAVENOUS
  Filled 2018-10-01 (×4): qty 40

## 2018-10-01 MED ORDER — METOCLOPRAMIDE HCL 5 MG/ML IJ SOLN
10.0000 mg | Freq: Three times a day (TID) | INTRAMUSCULAR | Status: DC
  Administered 2018-10-01 – 2018-10-02 (×4): 10 mg via INTRAVENOUS
  Filled 2018-10-01 (×4): qty 2

## 2018-10-01 NOTE — H&P (View-Only) (Signed)
GI Inpatient Consult Note  Reason for Consult: Abdominal pain and nausea   Attending Requesting Consult: Dr. Dustin Flock, MD  History of Present Illness: Andrea [name] is a 73 y.o. female seen for evaluation of abdominal pain and nausea at the request of Dr. Dustin Flock, MD. Patient was admitted 05/16 for diffuse abdominal pain and nausea that has reportedly been going on for weeks. CT abd/pelvis showed no findings to account for the patient's abd pain, no evidence of bowel obstruction, nodular hepatic contour raising possibility of cirrhosis, mild rectal wall thickening without inflammatory changes, favoring underdistention. She has been receiving zofran IV and reglan, which have not been helping. She endorses 1-week history of epigastric pain that is dull in nature. She reports her main issue is the nausea - "I just feel so sick to my stomach." She reports pain like this several years ago "which got better with some medicines." She reports one episode of non-bloody and non-melanotic emesis yesterday. Nausea has been really bad today and she has had some dry heaving. She had EGD 04/2011 that showed irregular Z-line with biopsies showing reflux esophagitis, gastritis with mild reactive gastropathy, normal duodenum. She also had EGD in 2016 performed by Dr. Brandon Melnick s/t dysphagia showing no endoscopic abnormality to explain patient's dysphagia, single mucosal papule in stomach with path showing gastric hyperplastic polyp, normal examined duodenum. She had colonoscopy 2013 which showed sigmoid diverticulosis with tubular adenoma x 2. She did not have repeat surveillance in 5 years as recommended. She denies any frequent NSAID use. She denies any dysphagia or odynophagia symptoms currently. She has had cholecystectomy.    Last Colonoscopy: 2013 - sigmoid diverticulosis, tubular adenoma x2 Last Endoscopy: 04/2011 - irregular z line, gastritis, normal duodenum. Path showing mild reactive gastropathy,  reflux esophagitis, no EoE, no h pylori    Past Medical History:  Past Medical History:  Diagnosis Date  . CHF (congestive heart failure) ([place])   . Chronic back pain    Followed by pain clinic in [place]  . Endometriosis   . HTN (hypertension)   . Hypothyroidism   . IDDM (insulin dependent diabetes mellitus) ([place])   . Mild dementia ([place])   . NASH (nonalcoholic steatohepatitis)    has had liver biopsy in past  . Obesity   . PUD (peptic ulcer disease) 1990's    Problem List: Patient Active Problem List   Diagnosis Date Noted  . Abdominal pain 09/27/2018  . Drug overdose 03/08/2017  . Altered mental status   . Encephalopathy acute 01/05/2017  . CHF (congestive heart failure) ([place]) 06/18/2016  . GERD (gastroesophageal reflux disease) 03/07/2013  . Other and unspecified hyperlipidemia 03/07/2013  . Dizziness and giddiness 11/21/2012  . Urinary incontinence 09/11/2012  . Chronic low back pain 07/30/2012  . Anemia 07/30/2012  . Noncompliance 07/30/2012  . Neuropathy 01/12/2012  . Carotid stenosis, right 11/29/2011  . Diabetes mellitus type 2, uncontrolled ([place]) 11/29/2011  . Hypertension 11/29/2011  . Renal artery stenosis ([place]) 06/04/2011  . Constipation, chronic 06/04/2011  . Hypothyroidism 04/11/2011    Past Surgical History: Past Surgical History:  Procedure Laterality Date  . CHOLECYSTECTOMY    . TOTAL ABDOMINAL HYSTERECTOMY W/ BILATERAL SALPINGOOPHORECTOMY      Allergies: Allergies  Allergen Reactions  . Watermelon Concentrate Anaphylaxis  . Ace Inhibitors   . Celebrex [Celecoxib] Rash  . Penicillins Rash    Has patient had a PCN reaction causing immediate rash, facial/tongue/throat swelling, SOB or lightheadedness with hypotension: Yes Has patient had a  PCN reaction causing severe rash involving mucus membranes or skin necrosis:  Has patient had a PCN reaction that required hospitalization: No Has patient had a PCN reaction occurring within the last 10  years: No If all of the above answers are "NO", then Janusz proceed with Cephalosporin use. No  . Sulfa Antibiotics Rash    Home Medications: Medications Prior to Admission  Medication Sig Dispense Refill Last Dose  . acetaminophen (TYLENOL) 325 MG tablet Take 650 mg by mouth 3 (three) times daily as needed.     prn at prn  . albuterol (PROVENTIL) (2.5 MG/3ML) 0.083% nebulizer solution Take 2.5 mg by nebulization every 6 (six) hours as needed for wheezing or shortness of breath.   prn at prn  . amLODipine (NORVASC) 10 MG tablet TAKE ONE TABLET BY MOUTH ONCE DAILY *NEEDS APPOINTMENT FOR FURTHER REFILLS* (Patient taking differently: TAKE ONE TABLET BY MOUTH ONCE DAILY) 30 tablet 0 03/08/2017 at 1100  . aspirin 81 MG tablet Take 81 mg by mouth daily.   03/08/2017 at 1100  . atorvastatin (LIPITOR) 80 MG tablet Take 80 mg by mouth daily.   03/07/2017 at 2230  . bumetanide (BUMEX) 1 MG tablet Take 3 mg by mouth 2 (two) times daily. Only taken as needed depending on weigh loss per spouse   03/08/2017 at 1100  . carvedilol (COREG) 25 MG tablet Take 1 tablet by mouth 2 (two) times daily with a meal.    03/08/2017 at 1100  . chlorthalidone (HYGROTON) 25 MG tablet Take 1 tablet by mouth daily.   Not Taking at Unknown time  . cholecalciferol (VITAMIN D) 1000 units tablet Take 1,000 Units by mouth daily.   03/08/2017 at 1100  . cloNIDine (CATAPRES) 0.3 MG tablet Take 1 tablet by mouth 2 (two) times daily.   03/08/2017 at 1100  . gabapentin (NEURONTIN) 300 MG capsule Take 600 mg by mouth 2 (two) times daily.   03/08/2017 at 1100  . insulin aspart (NOVOLOG) 100 UNIT/ML injection Inject 3-15 Units into the skin 3 (three) times daily before meals. Sliding scale (Patient taking differently: Inject 0-12 Units into the skin 3 (three) times daily before meals. Sliding scale) 1 vial 3 prn at prn  . Insulin Glargine (TOUJEO SOLOSTAR) 300 UNIT/ML SOPN Inject 30 Units into the skin daily. (Patient taking differently: Inject  70 Units into the skin daily. ) 1 pen 0 03/08/2017 at 0930  . ipratropium (ATROVENT) 0.02 % nebulizer solution Take 0.5 mg by nebulization every 6 (six) hours as needed for wheezing or shortness of breath.   prn at prn  . levothyroxine (SYNTHROID, LEVOTHROID) 175 MCG tablet Take 1 tablet by mouth daily. Given one hour prior to any other medications   03/08/2017 at 0930  . losartan (COZAAR) 50 MG tablet Take 1 tablet by mouth daily.   03/08/2017 at 1100  . OXYGEN Inhale 1 L into the lungs continuous.   Taking  . PARoxetine (PAXIL) 40 MG tablet Take 1 tablet by mouth daily.    03/08/2017 at 1100  . traZODone (DESYREL) 50 MG tablet Take 50 mg by mouth at bedtime as needed for sleep.   prn at prn  . vitamin B-12 (CYANOCOBALAMIN) 1000 MCG tablet Take 5,000 mcg by mouth daily.    03/08/2017 at [place] medication reconciliation was completed with the patient.   Scheduled Inpatient Medications:   . amLODipine  10 mg Oral Daily  . aspirin EC  81 mg Oral Daily  . atorvastatin  80 mg Oral Daily  . cholecalciferol  1,000 Units Oral Daily  . docusate sodium  200 mg Oral BID  . gabapentin  300 mg Oral BID  . heparin  5,000 Units Subcutaneous Q8H  . hydrALAZINE  25 mg Oral Q8H  . insulin aspart  0-5 Units Subcutaneous QHS  . insulin aspart  0-9 Units Subcutaneous TID WC  . insulin aspart  5 Units Subcutaneous TID WC  . insulin glargine  40 Units Subcutaneous Daily  . levothyroxine  175 mcg Oral Q0600  . mouth rinse  15 mL Mouth Rinse BID  . metoCLOPramide (REGLAN) injection  10 mg Intravenous Q8H  . nitroGLYCERIN  0.5 inch Topical Q6H  . pantoprazole (PROTONIX) IV  40 mg Intravenous Q12H  . PARoxetine  40 mg Oral Daily  . polyethylene glycol  17 g Oral Daily  . sodium chloride flush  3 mL Intravenous Q12H  . vitamin B-12  5,000 mcg Oral Daily    Continuous Inpatient Infusions:   . sodium chloride    . sodium chloride 40 mL/hr at 10/01/18 1108    PRN Inpatient Medications:  sodium  chloride, acetaminophen, albuterol, hydrALAZINE, ipratropium, labetalol, morphine injection, ondansetron **OR** ondansetron (ZOFRAN) IV, phenol, prochlorperazine, sodium chloride flush, traZODone  Family History: family history includes COPD in her sister; Cancer in her brother; Emphysema in her mother; Heart disease in her father.  The patient's family history is negative for inflammatory bowel disorders, GI malignancy, or solid organ transplantation.  Social History:   reports that she has quit smoking. She has never used smokeless tobacco. She reports that she does not drink alcohol or use drugs. The patient denies ETOH, tobacco, or drug use.   Review of Systems: Constitutional: Weight is stable.  Eyes: No changes in vision. ENT: No oral lesions, sore throat.  GI: see HPI.  Heme/Lymph: No easy bruising.  CV: No chest pain.  GU: No hematuria.  Integumentary: No rashes.  Neuro: No headaches.  Psych: No depression/anxiety.  Endocrine: No heat/cold intolerance.  Allergic/Immunologic: No urticaria.  Resp: No cough, SOB.  Musculoskeletal: No joint swelling.    Physical Examination: BP (!) 196/78 (BP Location: Right Arm)   Pulse 78   Temp (!) 97.5 F (36.4 C)   Resp 17   Ht 5\' 1"  (1.549 m)   Wt 81.2 kg   SpO2 93%   BMI 33.82 kg/m  Gen: NAD, alert and oriented x 4 HEENT: PEERLA, EOMI, Neck: supple, no JVD or thyromegaly Chest: CTA bilaterally, no wheezes, crackles, or other adventitious sounds CV: RRR, no m/g/c/r Abd: soft, TTP in epigastric region, ND, +BS in all four quadrants; no HSM, guarding, ridigity, or rebound tenderness Ext: no edema, well perfused with 2+ pulses, Skin: no rash or lesions noted Lymph: no LAD  Data: Lab Results  Component Value Date   WBC 12.0 (H) 09/28/2018   HGB 12.3 09/28/2018   HCT 38.7 09/28/2018   MCV 84.1 09/28/2018   PLT 204 09/28/2018   Recent Labs  Lab 09/27/18 0417 09/27/18 1418 09/28/18 0541  HGB 15.4* 12.7 12.3   Lab  Results  Component Value Date   NA 135 10/01/2018   K 3.7 10/01/2018   CL 104 10/01/2018   CO2 22 10/01/2018   BUN 37 (H) 10/01/2018   CREATININE 1.94 (H) 10/01/2018   GLU 135 04/03/2011   Lab Results  Component Value Date   ALT 14 09/27/2018   AST 27 09/27/2018   ALKPHOS 172 (H) 09/27/2018  BILITOT 0.9 09/27/2018   No results for input(s): APTT, INR, PTT in the last 168 hours. Assessment/Plan:  74 y/o Caucasian female with a PMH of chronic diastolic CHF, chronic back pain, HTN, hypothyroidism, IDDM, mild dementia, CKD Stage IV, NASH, Hx of PUD, obesity admitted for diffuse abdominal pain and nausea  1. Epigastric abd pain - CT scan negative 2. Intractable nausea 3. Probable cirrhosis - consider outpatient workup 4. Personal Hx of adenomatous polyps - was due for surveillance in 2018, she should f/u as outpatient. I do not think she would tolerate a full bowel prep at this time given her intractable nausea.  - Etiologies to consider are peptic ulcer disease, gastritis +/- H pylori, duodenitis, esophagitis, gastroparesis, functional dyspepsia, IBS - Continue Protonix IV - Agree with trying Reglan as pro-motility agent q8h - Advise EGD for luminal evaluation with biopsies of esophagus and stomach and possible duodenum. I reviewed the risks (including bleeding, perforation, infection, anesthesia complications, cardiac/respiratory complications), benefits and alternatives of EGD. Patient consents to proceed.  - NPO after midnight - Further recs after procedure   Thank you for the consult. Please call with questions or concerns.  Reeves Forth [facility] [phone] (Cell)

## 2018-10-01 NOTE — Progress Notes (Signed)
[place] [facility]                                                                                                                                                                                  Patient Demographics   Andrea [name], is a 73 y.o. female, DOB - 09/03/1945, TGG:[id number]  Admit date - 09/27/2018   Admitting Physician Dustin Flock, MD  Outpatient Primary MD for the patient is Aline August, MD   LOS - 4  Subjective:  Patient continues to complain of nausea renal function improved   Review of Systems:   CONSTITUTIONAL: No documented fever. No fatigue, weakness. No weight gain, no weight loss.  EYES: No blurry or double vision.  ENT: No tinnitus. No postnasal drip. No redness of the oropharynx.  RESPIRATORY: No cough, no wheeze, no hemoptysis. No dyspnea.  CARDIOVASCULAR: No chest pain. No orthopnea. No palpitations. No syncope.  GASTROINTESTINAL: Positive nausea, no vomiting or diarrhea. No abdominal pain. No melena or hematochezia.  GENITOURINARY: No dysuria or hematuria.  ENDOCRINE: No polyuria or nocturia. No heat or cold intolerance.  HEMATOLOGY: No anemia. No bruising. No bleeding.  INTEGUMENTARY: No rashes. No lesions.  MUSCULOSKELETAL: No arthritis. No swelling. No gout.  NEUROLOGIC: No numbness, tingling, or ataxia. No seizure-type activity.  PSYCHIATRIC: No anxiety. No insomnia. No ADD.    Vitals:   Vitals:   09/30/18 2359 10/01/18 0427 10/01/18 0500 10/01/18 0752  BP: (!) 158/76 (!) 170/73  (!) 196/78  Pulse:  85  78  Resp:  16  17  Temp:  98.1 F (36.7 C)  (!) 97.5 F (36.4 C)  TempSrc:  Oral    SpO2:  97%  93%  Weight:   81.2 kg   Height:        Wt Readings from Last 3 Encounters:  10/01/18 81.2 kg  03/08/17 84.4 kg  01/05/17 89.4 kg     Intake/Output Summary (Last 24 hours) at 10/01/2018 1322 Last data filed at 09/30/2018 1855 Gross per 24 hour  Intake 240 ml  Output 0 ml  Net 240 ml    Physical  Exam:   GENERAL: Pleasant-appearing in no apparent distress.  HEAD, EYES, EARS, NOSE AND THROAT: Atraumatic, normocephalic. Extraocular muscles are intact. Pupils equal and reactive to light. Sclerae anicteric. No conjunctival injection. No oro-pharyngeal erythema.  NECK: Supple. There is no jugular venous distention. No bruits, no lymphadenopathy, no thyromegaly.  HEART: Regular rate and rhythm,. No murmurs, no rubs, no clicks.  LUNGS: Clear to auscultation bilaterally. No rales or rhonchi. No wheezes.  ABDOMEN: Soft, flat, nontender, nondistended. Has good bowel sounds. No hepatosplenomegaly appreciated.  EXTREMITIES: No  evidence of any cyanosis, clubbing, or peripheral edema.  +2 pedal and radial pulses bilaterally.  NEUROLOGIC: The patient is alert, awake, and oriented x3 with no focal motor or sensory deficits appreciated bilaterally.  SKIN: Moist and warm with no rashes appreciated.  Psych: Not anxious, depressed LN: No inguinal LN enlargement    Antibiotics   Anti-infectives (From admission, onward)   Start     Dose/Rate Route Frequency Ordered Stop   09/28/18 0800  cefTRIAXone (ROCEPHIN) 1 g in sodium chloride 0.9 % 100 mL IVPB  Status:  Discontinued     1 g 200 mL/hr over 30 Minutes Intravenous Every 24 hours 09/27/18 0822 09/29/18 0921   09/27/18 0645  cefTRIAXone (ROCEPHIN) 1 g in sodium chloride 0.9 % 100 mL IVPB     1 g 200 mL/hr over 30 Minutes Intravenous STAT 09/27/18 0631 09/27/18 0747      Medications   Scheduled Meds: . amLODipine  10 mg Oral Daily  . aspirin EC  81 mg Oral Daily  . atorvastatin  80 mg Oral Daily  . cholecalciferol  1,000 Units Oral Daily  . docusate sodium  200 mg Oral BID  . gabapentin  300 mg Oral BID  . heparin  5,000 Units Subcutaneous Q8H  . hydrALAZINE  25 mg Oral Q8H  . insulin aspart  0-5 Units Subcutaneous QHS  . insulin aspart  0-9 Units Subcutaneous TID WC  . insulin aspart  5 Units Subcutaneous TID WC  . insulin glargine  40  Units Subcutaneous Daily  . levothyroxine  175 mcg Oral Q0600  . mouth rinse  15 mL Mouth Rinse BID  . nitroGLYCERIN  0.5 inch Topical Q6H  . pantoprazole (PROTONIX) IV  40 mg Intravenous Q12H  . PARoxetine  40 mg Oral Daily  . polyethylene glycol  17 g Oral Daily  . sodium chloride flush  3 mL Intravenous Q12H  . vitamin B-12  5,000 mcg Oral Daily   Continuous Infusions: . sodium chloride    . sodium chloride 40 mL/hr at 10/01/18 1108   PRN Meds:.sodium chloride, acetaminophen, albuterol, hydrALAZINE, ipratropium, labetalol, morphine injection, ondansetron **OR** ondansetron (ZOFRAN) IV, phenol, prochlorperazine, sodium chloride flush, traZODone   Data Review:   Micro Results Recent Results (from the past 240 hour(s))  Urine Culture     Status: None   Collection Time: 09/27/18  4:17 AM  Result Value Ref Range Status   Specimen Description   Final    URINE, CATHETERIZED Performed at [facility], [address], [place], [place] [address]    Special Requests   Final    Normal Performed at [facility], [address], [place], [place] [address]    Culture   Final    NO GROWTH Performed at [facility], [place] [address], [place], [place] [address]    Report Status 09/28/2018 FINAL  Final  SARS Coronavirus 2 (CEPHEID - Performed in [facility] lab), Hosp Order     Status: None   Collection Time: 09/27/18  4:43 AM  Result Value Ref Range Status   SARS Coronavirus 2 NEGATIVE NEGATIVE Final    Comment: (NOTE) If result is NEGATIVE SARS-CoV-2 target nucleic acids are NOT DETECTED. The SARS-CoV-2 RNA is generally detectable in upper and lower  respiratory specimens during the acute phase of infection. The lowest  concentration of SARS-CoV-2 viral copies this assay can detect is 250  copies / mL. A negative result does not preclude SARS-CoV-2 infection  and should not be used as the sole basis for treatment or other  patient management  decisions.  A negative result Kipper occur with  improper specimen collection / handling, submission of specimen other  than nasopharyngeal swab, presence of viral mutation(s) within the  areas targeted by this assay, and inadequate number of viral copies  (<250 copies / mL). A negative result must be combined with clinical  observations, patient history, and epidemiological information. If result is POSITIVE SARS-CoV-2 target nucleic acids are DETECTED. The SARS-CoV-2 RNA is generally detectable in upper and lower  respiratory specimens dur ing the acute phase of infection.  Positive  results are indicative of active infection with SARS-CoV-2.  Clinical  correlation with patient history and other diagnostic information is  necessary to determine patient infection status.  Positive results do  not rule out bacterial infection or co-infection with other viruses. If result is PRESUMPTIVE POSTIVE SARS-CoV-2 nucleic acids [place]issen BE PRESENT.   A presumptive positive result was obtained on the submitted specimen  and confirmed on repeat testing.  While 2019 novel coronavirus  (SARS-CoV-2) nucleic acids [place] be present in the submitted sample  additional confirmatory testing [place] be necessary for epidemiological  and / or clinical management purposes  to differentiate between  SARS-CoV-2 and other Sarbecovirus currently known to infect humans.  If clinically indicated additional testing with an alternate test  methodology [phone]) is advised. The SARS-CoV-2 RNA is generally  detectable in upper and lower respiratory sp ecimens during the acute  phase of infection. The expected result is Negative. Fact Sheet for Patients:  StrictlyIdeas.no Fact Sheet for Healthcare Providers: BankingDealers.co.za This test is not yet approved or cleared by the [place] FDA and has been authorized for detection and/or diagnosis of SARS-CoV-2 by FDA under an  Emergency Use Authorization (EUA).  This EUA will remain in effect (meaning this test can be used) for the duration of the COVID-19 declaration under Section 564(b)(1) of the Act, 21 U.S.C. section 360bbb-3(b)(1), unless the authorization is terminated or revoked sooner. Performed at [facility], [address] [place], [place] [address]     Radiology Reports Ct Abdomen Pelvis Wo Contrast  Result Date: 09/27/2018 CLINICAL DATA:  Nausea/vomiting, abdominal pain, UTI. EXAM: CT ABDOMEN AND PELVIS WITHOUT CONTRAST TECHNIQUE: Multidetector CT imaging of the abdomen and pelvis was performed following the standard protocol without IV contrast. COMPARISON:  05/19/2011 FINDINGS: Lower chest: Mosaic attenuation at the lung bases. Hepatobiliary: Nodular hepatic contour, raising the possibility of cirrhosis. Status post cholecystectomy. No intrahepatic or extrahepatic duct dilatation. Pancreas: Within normal limits. Spleen: Normal in size. Adrenals/Urinary Tract: Adrenal glands are within normal limits. Left renal atrophy. Right kidney is within normal limits. No renal, ureteral, or bladder calculi. No hydronephrosis. Bladder is within normal limits. Stomach/Bowel: Stomach is notable for a tiny hiatal hernia. No evidence of bowel obstruction. Appendix is not discretely visualized and Bramble be surgically absent. Mild rectal wall thickening (series 2/image 77), without inflammatory changes, favoring underdistention. Vascular/Lymphatic: No evidence of abdominal aortic aneurysm. Atherosclerotic calcifications of the abdominal aorta and branch vessels. No suspicious abdominopelvic lymphadenopathy. Reproductive: Status post hysterectomy.  No adnexal masses. Other: No abdominopelvic ascites. Musculoskeletal: Mild degenerative changes of the lower thoracic spine. IMPRESSION: No evidence of bowel obstruction. Appendix is not discretely visualized and Ledee be surgically absent. No CT findings to account for the  patient's abdominal pain. Possible cirrhosis. Left renal atrophy. Status post cholecystectomy and hysterectomy. Electronically Signed   By: Henderson Newcomer.D.  On: 09/27/2018 06:10   US Renal  Result Date: 09/28/2018 CLINICAL DATA:  Acute renal failure EXAM: RENAL / URINARY TRACT ULTRASOUND COMPLETE COMPARISON:  CT scan [place] 16, 2020 FINDINGS: Right Kidney: Renal measurements: Acute renal failure = volume: 9.1 x 4.8 x 5 cm mL. One hundred fifteen Left Kidney: Renal measurements: 5.8 by 4.1 x 3.8 cm = volume: 47 mL. Increased cortical echogenicity and cortical thinning. Bladder: Appears normal for degree of bladder distention. IMPRESSION: The left kidney is atrophic compared to the right, correlating with recent CT findings. No cause for acute renal failure noted. Electronically Signed   By: Dorise Bullion III M.D   On: 09/28/2018 10:58     CBC Recent Labs  Lab 09/27/18 0417 09/27/18 1418 09/28/18 0541  WBC 11.3* 12.5* 12.0*  HGB 15.4* 12.7 12.3  HCT 44.5 38.7 38.7  PLT 237 207 204  MCV 78.2* 81.0 84.1  MCH 27.1 26.6 26.7  MCHC 34.6 32.8 31.8  RDW 14.3 14.5 14.8  LYMPHSABS 1.4  --   --   MONOABS 0.4  --   --   EOSABS 0.0  --   --   BASOSABS 0.1  --   --     Chemistries  Recent Labs  Lab 09/27/18 0417 09/27/18 1418 09/28/18 0541 09/29/18 0556 09/30/18 0505 10/01/18 1120  NA 141  --  142 136 139 135  K 3.4*  --  3.5 4.2 4.5 3.7  CL 103  --  106 99 107 104  CO2 25  --  27 26 23 22   GLUCOSE 302*  --  80 344* 355* 238*  BUN 31*  --  43* 60* 51* 37*  CREATININE 1.89* 1.89* 3.27* 3.57* 2.28* 1.94*  CALCIUM 10.1  --  8.7* 8.5* 8.9 9.0  AST 27  --   --   --   --   --   ALT 14  --   --   --   --   --   ALKPHOS 172*  --   --   --   --   --   BILITOT 0.9  --   --   --   --   --    ------------------------------------------------------------------------------------------------------------------ estimated creatinine clearance is 25.3 mL/min (A) (by C-G formula based on SCr of  1.94 mg/dL (H)). ------------------------------------------------------------------------------------------------------------------ No results for input(s): HGBA1C in the last 72 hours. ------------------------------------------------------------------------------------------------------------------ No results for input(s): CHOL, HDL, LDLCALC, TRIG, CHOLHDL, LDLDIRECT in the last 72 hours. ------------------------------------------------------------------------------------------------------------------ No results for input(s): TSH, T4TOTAL, T3FREE, THYROIDAB in the last 72 hours.  Invalid input(s): FREET3 ------------------------------------------------------------------------------------------------------------------ No results for input(s): VITAMINB12, FOLATE, FERRITIN, TIBC, IRON, RETICCTPCT in the last 72 hours.  Coagulation profile No results for input(s): INR, PROTIME in the last 168 hours.  No results for input(s): DDIMER in the last 72 hours.  Cardiac Enzymes Recent Labs  Lab 09/27/18 0859 09/27/18 1418 09/27/18 2013  TROPONINI 0.19* 0.17* 0.22*   ------------------------------------------------------------------------------------------------------------------ Invalid input(s): POCBNP    Assessment & Plan   IMPRESSION AND PLAN: Patient 73 year old female with history of dementia presenting with abdominal pain  1.  Abdominal pain nausea symptoms persist I will asked GI to see   2.  Acute renal failure on chronic kidney disease  Renal function appears to be at baseline   3.  Accelerated hypertension there is a lot of fluctuation in the blood pressure, continue hydralazine pressure stable  4.  Elevated troponin suspect this is due to demand ischemia we will follow troponin  levels cardiology consult if troponin trends upward  5.  Chronic diastolic CHF currently compensated continue to monitor  6.  Diabetes type 2 with elevated blood sugar adjust her insulin as  recommended by diabetic coordinator  7.  Dementia monitor mental status     Code Status Orders  (From admission, onward)         Start     Ordered   09/27/18 1207  Full code  Continuous     09/27/18 1207        Code Status History    Date Active Date Inactive Code Status Order ID Comments User Context   03/08/2017 0309 03/09/2017 1455 Full Code [id number]  Vaughan Basta, MD Inpatient   01/05/2017 1629 01/11/2017 1802 Full Code [id number]  Nicholes Mango, MD ED   06/18/2016 0329 06/19/2016 1954 Full Code [id number]  Saundra Shelling, MD Inpatient    Advance Directive Documentation     Most Recent Value  Type of Advance Directive  Living will  Pre-existing out of facility DNR order (yellow form or pink MOST form)  -  "MOST" Form in Place?  -           Consults none  DVT Prophylaxis  Lovenox  Lab Results  Component Value Date   PLT 204 09/28/2018     Time Spent in minutes 35-minute greater than 50% of time spent in care coordination and counseling patient regarding the condition and plan of care.   Dustin Flock M.D on 10/01/2018 at 1:22 PM  Between 7am to 6pm - Pager - [phone]  After 6pm go to [email or web address] - Proofreader  [employer]   Office  [phone]

## 2018-10-01 NOTE — Progress Notes (Signed)
Attempted to call contact for mandatory daily update. No answer. Wenda Low [facility]

## 2018-10-01 NOTE — Progress Notes (Signed)
Patient's son called at shift change.  This RN called the son and put the patient on the phone.  Patient having conversation now.

## 2018-10-01 NOTE — Consult Note (Signed)
GI Inpatient Consult Note  Reason for Consult: Abdominal pain and nausea   Attending Requesting Consult: Dr. Dustin Flock, MD  History of Present Illness: Andrea [name] is a 73 y.o. female seen for evaluation of abdominal pain and nausea at the request of Dr. Dustin Flock, MD. Patient was admitted 05/16 for diffuse abdominal pain and nausea that has reportedly been going on for weeks. CT abd/pelvis showed no findings to account for the patient's abd pain, no evidence of bowel obstruction, nodular hepatic contour raising possibility of cirrhosis, mild rectal wall thickening without inflammatory changes, favoring underdistention. She has been receiving zofran IV and reglan, which have not been helping. She endorses 1-week history of epigastric pain that is dull in nature. She reports her main issue is the nausea - "I just feel so sick to my stomach." She reports pain like this several years ago "which got better with some medicines." She reports one episode of non-bloody and non-melanotic emesis yesterday. Nausea has been really bad today and she has had some dry heaving. She had EGD 04/2011 that showed irregular Z-line with biopsies showing reflux esophagitis, gastritis with mild reactive gastropathy, normal duodenum. She also had EGD in 2016 performed by Dr. Brandon Melnick s/t dysphagia showing no endoscopic abnormality to explain patient's dysphagia, single mucosal papule in stomach with path showing gastric hyperplastic polyp, normal examined duodenum. She had colonoscopy 2013 which showed sigmoid diverticulosis with tubular adenoma x 2. She did not have repeat surveillance in 5 years as recommended. She denies any frequent NSAID use. She denies any dysphagia or odynophagia symptoms currently. She has had cholecystectomy.    Last Colonoscopy: 2013 - sigmoid diverticulosis, tubular adenoma x2 Last Endoscopy: 04/2011 - irregular z line, gastritis, normal duodenum. Path showing mild reactive gastropathy,  reflux esophagitis, no EoE, no h pylori    Past Medical History:  Past Medical History:  Diagnosis Date  . CHF (congestive heart failure) ([place])   . Chronic back pain    Followed by pain clinic in [place]  . Endometriosis   . HTN (hypertension)   . Hypothyroidism   . IDDM (insulin dependent diabetes mellitus) ([place])   . Mild dementia ([place])   . NASH (nonalcoholic steatohepatitis)    has had liver biopsy in past  . Obesity   . PUD (peptic ulcer disease) 1990's    Problem List: Patient Active Problem List   Diagnosis Date Noted  . Abdominal pain 09/27/2018  . Drug overdose 03/08/2017  . Altered mental status   . Encephalopathy acute 01/05/2017  . CHF (congestive heart failure) ([place]) 06/18/2016  . GERD (gastroesophageal reflux disease) 03/07/2013  . Other and unspecified hyperlipidemia 03/07/2013  . Dizziness and giddiness 11/21/2012  . Urinary incontinence 09/11/2012  . Chronic low back pain 07/30/2012  . Anemia 07/30/2012  . Noncompliance 07/30/2012  . Neuropathy 01/12/2012  . Carotid stenosis, right 11/29/2011  . Diabetes mellitus type 2, uncontrolled ([place]) 11/29/2011  . Hypertension 11/29/2011  . Renal artery stenosis ([place]) 06/04/2011  . Constipation, chronic 06/04/2011  . Hypothyroidism 04/11/2011    Past Surgical History: Past Surgical History:  Procedure Laterality Date  . CHOLECYSTECTOMY    . TOTAL ABDOMINAL HYSTERECTOMY W/ BILATERAL SALPINGOOPHORECTOMY      Allergies: Allergies  Allergen Reactions  . Watermelon Concentrate Anaphylaxis  . Ace Inhibitors   . Celebrex [Celecoxib] Rash  . Penicillins Rash    Has patient had a PCN reaction causing immediate rash, facial/tongue/throat swelling, SOB or lightheadedness with hypotension: Yes Has patient had a  PCN reaction causing severe rash involving mucus membranes or skin necrosis:  Has patient had a PCN reaction that required hospitalization: No Has patient had a PCN reaction occurring within the last 10  years: No If all of the above answers are "NO", then Tony proceed with Cephalosporin use. No  . Sulfa Antibiotics Rash    Home Medications: Medications Prior to Admission  Medication Sig Dispense Refill Last Dose  . acetaminophen (TYLENOL) 325 MG tablet Take 650 mg by mouth 3 (three) times daily as needed.     prn at prn  . albuterol (PROVENTIL) (2.5 MG/3ML) 0.083% nebulizer solution Take 2.5 mg by nebulization every 6 (six) hours as needed for wheezing or shortness of breath.   prn at prn  . amLODipine (NORVASC) 10 MG tablet TAKE ONE TABLET BY MOUTH ONCE DAILY *NEEDS APPOINTMENT FOR FURTHER REFILLS* (Patient taking differently: TAKE ONE TABLET BY MOUTH ONCE DAILY) 30 tablet 0 03/08/2017 at 1100  . aspirin 81 MG tablet Take 81 mg by mouth daily.   03/08/2017 at 1100  . atorvastatin (LIPITOR) 80 MG tablet Take 80 mg by mouth daily.   03/07/2017 at 2230  . bumetanide (BUMEX) 1 MG tablet Take 3 mg by mouth 2 (two) times daily. Only taken as needed depending on weigh loss per spouse   03/08/2017 at 1100  . carvedilol (COREG) 25 MG tablet Take 1 tablet by mouth 2 (two) times daily with a meal.    03/08/2017 at 1100  . chlorthalidone (HYGROTON) 25 MG tablet Take 1 tablet by mouth daily.   Not Taking at Unknown time  . cholecalciferol (VITAMIN D) 1000 units tablet Take 1,000 Units by mouth daily.   03/08/2017 at 1100  . cloNIDine (CATAPRES) 0.3 MG tablet Take 1 tablet by mouth 2 (two) times daily.   03/08/2017 at 1100  . gabapentin (NEURONTIN) 300 MG capsule Take 600 mg by mouth 2 (two) times daily.   03/08/2017 at 1100  . insulin aspart (NOVOLOG) 100 UNIT/ML injection Inject 3-15 Units into the skin 3 (three) times daily before meals. Sliding scale (Patient taking differently: Inject 0-12 Units into the skin 3 (three) times daily before meals. Sliding scale) 1 vial 3 prn at prn  . Insulin Glargine (TOUJEO SOLOSTAR) 300 UNIT/ML SOPN Inject 30 Units into the skin daily. (Patient taking differently: Inject  70 Units into the skin daily. ) 1 pen 0 03/08/2017 at 0930  . ipratropium (ATROVENT) 0.02 % nebulizer solution Take 0.5 mg by nebulization every 6 (six) hours as needed for wheezing or shortness of breath.   prn at prn  . levothyroxine (SYNTHROID, LEVOTHROID) 175 MCG tablet Take 1 tablet by mouth daily. Given one hour prior to any other medications   03/08/2017 at 0930  . losartan (COZAAR) 50 MG tablet Take 1 tablet by mouth daily.   03/08/2017 at 1100  . OXYGEN Inhale 1 L into the lungs continuous.   Taking  . PARoxetine (PAXIL) 40 MG tablet Take 1 tablet by mouth daily.    03/08/2017 at 1100  . traZODone (DESYREL) 50 MG tablet Take 50 mg by mouth at bedtime as needed for sleep.   prn at prn  . vitamin B-12 (CYANOCOBALAMIN) 1000 MCG tablet Take 5,000 mcg by mouth daily.    03/08/2017 at [place] medication reconciliation was completed with the patient.   Scheduled Inpatient Medications:   . amLODipine  10 mg Oral Daily  . aspirin EC  81 mg Oral Daily  . atorvastatin  80 mg Oral Daily  . cholecalciferol  1,000 Units Oral Daily  . docusate sodium  200 mg Oral BID  . gabapentin  300 mg Oral BID  . heparin  5,000 Units Subcutaneous Q8H  . hydrALAZINE  25 mg Oral Q8H  . insulin aspart  0-5 Units Subcutaneous QHS  . insulin aspart  0-9 Units Subcutaneous TID WC  . insulin aspart  5 Units Subcutaneous TID WC  . insulin glargine  40 Units Subcutaneous Daily  . levothyroxine  175 mcg Oral Q0600  . mouth rinse  15 mL Mouth Rinse BID  . metoCLOPramide (REGLAN) injection  10 mg Intravenous Q8H  . nitroGLYCERIN  0.5 inch Topical Q6H  . pantoprazole (PROTONIX) IV  40 mg Intravenous Q12H  . PARoxetine  40 mg Oral Daily  . polyethylene glycol  17 g Oral Daily  . sodium chloride flush  3 mL Intravenous Q12H  . vitamin B-12  5,000 mcg Oral Daily    Continuous Inpatient Infusions:   . sodium chloride    . sodium chloride 40 mL/hr at 10/01/18 1108    PRN Inpatient Medications:  sodium  chloride, acetaminophen, albuterol, hydrALAZINE, ipratropium, labetalol, morphine injection, ondansetron **OR** ondansetron (ZOFRAN) IV, phenol, prochlorperazine, sodium chloride flush, traZODone  Family History: family history includes COPD in her sister; Cancer in her brother; Emphysema in her mother; Heart disease in her father.  The patient's family history is negative for inflammatory bowel disorders, GI malignancy, or solid organ transplantation.  Social History:   reports that she has quit smoking. She has never used smokeless tobacco. She reports that she does not drink alcohol or use drugs. The patient denies ETOH, tobacco, or drug use.   Review of Systems: Constitutional: Weight is stable.  Eyes: No changes in vision. ENT: No oral lesions, sore throat.  GI: see HPI.  Heme/Lymph: No easy bruising.  CV: No chest pain.  GU: No hematuria.  Integumentary: No rashes.  Neuro: No headaches.  Psych: No depression/anxiety.  Endocrine: No heat/cold intolerance.  Allergic/Immunologic: No urticaria.  Resp: No cough, SOB.  Musculoskeletal: No joint swelling.    Physical Examination: BP (!) 196/78 (BP Location: Right Arm)   Pulse 78   Temp (!) 97.5 F (36.4 C)   Resp 17   Ht 5\' 1"  (1.549 m)   Wt 81.2 kg   SpO2 93%   BMI 33.82 kg/m  Gen: NAD, alert and oriented x 4 HEENT: PEERLA, EOMI, Neck: supple, no JVD or thyromegaly Chest: CTA bilaterally, no wheezes, crackles, or other adventitious sounds CV: RRR, no m/g/c/r Abd: soft, TTP in epigastric region, ND, +BS in all four quadrants; no HSM, guarding, ridigity, or rebound tenderness Ext: no edema, well perfused with 2+ pulses, Skin: no rash or lesions noted Lymph: no LAD  Data: Lab Results  Component Value Date   WBC 12.0 (H) 09/28/2018   HGB 12.3 09/28/2018   HCT 38.7 09/28/2018   MCV 84.1 09/28/2018   PLT 204 09/28/2018   Recent Labs  Lab 09/27/18 0417 09/27/18 1418 09/28/18 0541  HGB 15.4* 12.7 12.3   Lab  Results  Component Value Date   NA 135 10/01/2018   K 3.7 10/01/2018   CL 104 10/01/2018   CO2 22 10/01/2018   BUN 37 (H) 10/01/2018   CREATININE 1.94 (H) 10/01/2018   GLU 135 04/03/2011   Lab Results  Component Value Date   ALT 14 09/27/2018   AST 27 09/27/2018   ALKPHOS 172 (H) 09/27/2018  BILITOT 0.9 09/27/2018   No results for input(s): APTT, INR, PTT in the last 168 hours. Assessment/Plan:  73 y/o Caucasian female with a PMH of chronic diastolic CHF, chronic back pain, HTN, hypothyroidism, IDDM, mild dementia, CKD Stage IV, NASH, Hx of PUD, obesity admitted for diffuse abdominal pain and nausea  1. Epigastric abd pain - CT scan negative 2. Intractable nausea 3. Probable cirrhosis - consider outpatient workup 4. Personal Hx of adenomatous polyps - was due for surveillance in 2018, she should f/u as outpatient. I do not think she would tolerate a full bowel prep at this time given her intractable nausea.  - Etiologies to consider are peptic ulcer disease, gastritis +/- H pylori, duodenitis, esophagitis, gastroparesis, functional dyspepsia, IBS - Continue Protonix IV - Agree with trying Reglan as pro-motility agent q8h - Advise EGD for luminal evaluation with biopsies of esophagus and stomach and possible duodenum. I reviewed the risks (including bleeding, perforation, infection, anesthesia complications, cardiac/respiratory complications), benefits and alternatives of EGD. Patient consents to proceed.  - NPO after midnight - Further recs after procedure   Thank you for the consult. Please call with questions or concerns.  Reeves Forth [facility] [phone] (Cell)

## 2018-10-01 NOTE — Progress Notes (Signed)
[facility]  ROUNDING NOTE   Subjective:  Kidney function improved. Creatinine down to 1.94. Resting comfortably in bed.   Objective:  Vital signs in last 24 hours:  Temp:  [97.5 F (36.4 C)-99.2 F (37.3 C)] 97.5 F (36.4 C) (05/20 0752) Pulse Rate:  [78-100] 78 (05/20 0752) Resp:  [16-20] 17 (05/20 0752) BP: (158-196)/(61-79) 196/78 (05/20 0752) SpO2:  [93 %-100 %] 93 % (05/20 0752) Weight:  [81.2 kg] 81.2 kg (05/20 0500)  Weight change: 2.676 kg Filed Weights   09/29/18 0406 09/30/18 0423 10/01/18 0500  Weight: 79.3 kg 78.5 kg 81.2 kg    Intake/Output: I/O last 3 completed shifts: In: 1407.7 [P.O.:240; I.V.:1167.7] Out: 0    Intake/Output this shift:  No intake/output data recorded.  Physical Exam: General: No acute distress  Head: Normocephalic, atraumatic. Moist oral mucosal membranes  Eyes: Anicteric  Neck: Supple, trachea midline  Lungs:  Clear to auscultation, normal effort  Heart: S1S2 no rubs  Abdomen:  Soft, nontender, bowel sounds present  Extremities: Trace peripheral edema.  Neurologic: Lethargic but arousable  Skin: No lesions       Basic Metabolic Panel: Recent Labs  Lab 09/27/18 0417 09/27/18 1418 09/28/18 0541 09/29/18 0556 09/30/18 0505 10/01/18 1120  NA 141  --  142 136 139 135  K 3.4*  --  3.5 4.2 4.5 3.7  CL 103  --  106 99 107 104  CO2 25  --  27 26 23 22   GLUCOSE 302*  --  80 344* 355* 238*  BUN 31*  --  43* 60* 51* 37*  CREATININE 1.89* 1.89* 3.27* 3.57* 2.28* 1.94*  CALCIUM 10.1  --  8.7* 8.5* 8.9 9.0  PHOS  --   --   --   --  3.3  --     Liver Function Tests: Recent Labs  Lab 09/27/18 0417 09/30/18 0505  AST 27  --   ALT 14  --   ALKPHOS 172*  --   BILITOT 0.9  --   PROT 8.1  --   ALBUMIN 4.2 3.1*   Recent Labs  Lab 09/27/18 0417  LIPASE 25   No results for input(s): AMMONIA in the last 168 hours.  CBC: Recent Labs  Lab 09/27/18 0417 09/27/18 1418 09/28/18 0541  WBC 11.3* 12.5* 12.0*   NEUTROABS 9.3*  --   --   HGB 15.4* 12.7 12.3  HCT 44.5 38.7 38.7  MCV 78.2* 81.0 84.1  PLT 237 207 204    Cardiac Enzymes: Recent Labs  Lab 09/27/18 0417 09/27/18 0859 09/27/18 1418 09/27/18 2013  TROPONINI 0.11* 0.19* 0.17* 0.22*    BNP: Invalid input(s): POCBNP  CBG: Recent Labs  Lab 09/30/18 0747 09/30/18 1132 09/30/18 1620 09/30/18 2104 10/01/18 0745  GLUCAP 278* 262* 350* 400* 75*    Microbiology: Results for orders placed or performed during the hospital encounter of 09/27/18  Urine Culture     Status: None   Collection Time: 09/27/18  4:17 AM  Result Value Ref Range Status   Specimen Description   Final    URINE, CATHETERIZED Performed at [facility], [address] [place], [place] [address]    Special Requests   Final    Normal Performed at [facility], [address] [place], [place] [address]    Culture   Final    NO GROWTH Performed at [facility], [place] [address] [place], [place] [address]    Report Status 09/28/2018 FINAL  Final  SARS Coronavirus 2 (CEPHEID - Performed in [facility] hospital lab), Hosp Order     Status: None   Collection Time: 09/27/18  4:43 AM  Result Value Ref Range Status   SARS Coronavirus 2 NEGATIVE NEGATIVE Final    Comment: (NOTE) If result is NEGATIVE SARS-CoV-2 target nucleic acids are NOT DETECTED. The SARS-CoV-2 RNA is generally detectable in upper and lower  respiratory specimens during the acute phase of infection. The lowest  concentration of SARS-CoV-2 viral copies this assay can detect is 250  copies / mL. A negative result does not preclude SARS-CoV-2 infection  and should not be used as the sole basis for treatment or other  patient management decisions.  A negative result Vondrasek occur with  improper specimen collection / handling, submission of specimen other  than nasopharyngeal swab, presence of viral mutation(s) within the  areas targeted by this assay, and inadequate  number of viral copies  (<250 copies / mL). A negative result must be combined with clinical  observations, patient history, and epidemiological information. If result is POSITIVE SARS-CoV-2 target nucleic acids are DETECTED. The SARS-CoV-2 RNA is generally detectable in upper and lower  respiratory specimens dur ing the acute phase of infection.  Positive  results are indicative of active infection with SARS-CoV-2.  Clinical  correlation with patient history and other diagnostic information is  necessary to determine patient infection status.  Positive results do  not rule out bacterial infection or co-infection with other viruses. If result is PRESUMPTIVE POSTIVE SARS-CoV-2 nucleic acids Hauger BE PRESENT.   A presumptive positive result was obtained on the submitted specimen  and confirmed on repeat testing.  While 2019 novel coronavirus  (SARS-CoV-2) nucleic acids Andis be present in the submitted sample  additional confirmatory testing Iacovelli be necessary for epidemiological  and / or clinical management purposes  to differentiate between  SARS-CoV-2 and other Sarbecovirus currently known to infect humans.  If clinically indicated additional testing with an alternate test  methodology [phone]) is advised. The SARS-CoV-2 RNA is generally  detectable in upper and lower respiratory sp ecimens during the acute  phase of infection. The expected result is Negative. Fact Sheet for Patients:  StrictlyIdeas.no Fact Sheet for Healthcare Providers: BankingDealers.co.za This test is not yet approved or cleared by the [place] FDA and has been authorized for detection and/or diagnosis of SARS-CoV-2 by FDA under an Emergency Use Authorization (EUA).  This EUA will remain in effect (meaning this test can be used) for the duration of the COVID-19 declaration under Section 564(b)(1) of the Act, 21 U.S.C. section 360bbb-3(b)(1), unless the  authorization is terminated or revoked sooner. Performed at [facility], [address] [place], [place] [address]     Coagulation Studies: No results for input(s): LABPROT, INR in the last 72 hours.  Urinalysis: No results for input(s): COLORURINE, LABSPEC, PHURINE, GLUCOSEU, HGBUR, BILIRUBINUR, KETONESUR, PROTEINUR, UROBILINOGEN, NITRITE, LEUKOCYTESUR in the last 72 hours.  Invalid input(s): APPERANCEUR    Imaging: No results found.   Medications:   . sodium chloride    . sodium chloride 40 mL/hr at 10/01/18 1108   . amLODipine  10 mg Oral Daily  . aspirin EC  81 mg Oral Daily  . atorvastatin  80 mg Oral Daily  . cholecalciferol  1,000 Units Oral Daily  . docusate sodium  200 mg Oral BID  . gabapentin  300 mg Oral BID  . heparin  5,000 Units Subcutaneous Q8H  . hydrALAZINE  25  mg Oral Q8H  . insulin aspart  0-5 Units Subcutaneous QHS  . insulin aspart  0-9 Units Subcutaneous TID WC  . insulin aspart  5 Units Subcutaneous TID WC  . insulin glargine  40 Units Subcutaneous Daily  . levothyroxine  175 mcg Oral Q0600  . mouth rinse  15 mL Mouth Rinse BID  . nitroGLYCERIN  0.5 inch Topical Q6H  . PARoxetine  40 mg Oral Daily  . polyethylene glycol  17 g Oral Daily  . sodium chloride flush  3 mL Intravenous Q12H  . vitamin B-12  5,000 mcg Oral Daily   sodium chloride, acetaminophen, albuterol, hydrALAZINE, ipratropium, labetalol, morphine injection, ondansetron **OR** ondansetron (ZOFRAN) IV, phenol, prochlorperazine, sodium chloride flush, traZODone  Assessment/ Plan:  73 y.o. female with a PMHx of Congestive heart failure, hypertension, hypothyroidism, diabetes mellitus 2, nonalcoholic fatty liver disease, obesity, peptic ulcer disease, chronic kidney disease stage IV who was admitted for abdominal pain, nausea.  Now found to have acute renal failure.  1.  Acute renal failure/chronic kidney disease stage IV baseline EGFR 26/diabetes mellitus type 2 with  chronic kidney disease/proteinuria.  Patient with atrophic left kidney.   -Kidney function improved.  Creatinine down to 1.94.  BUN also down to 37.  Reduce normal saline to 40 cc/h.  Follow-up renal parameters tomorrow.  2.  Hypertension.  Consider adding losartan as an outpatient for his blood pressure control but avoid for now given acute renal failure.  Continue amlodipine and hydralazine.   LOS: 4 Burnell Matlin 5/20/202012:20 PM

## 2018-10-01 NOTE — Progress Notes (Signed)
Inpatient Diabetes Program Recommendations  AACE/ADA: New Consensus Statement on Inpatient Glycemic Control   Target Ranges:  Prepandial:   less than 140 mg/dL      Peak postprandial:   less than 180 mg/dL (1-2 hours)      Critically ill patients:  140 - 180 mg/dL   Results for Andrea [name], Andrea [name] (MRN [id number]) as of 10/01/2018 09:49  Ref. Range 09/30/2018 07:47 09/30/2018 11:32 09/30/2018 16:20 09/30/2018 21:04 10/01/2018 07:45  Glucose-Capillary Latest Ref Range: 70 - 99 mg/dL 278 (H)  Novolog 5 units  Lantus 36 units 262 (H)  Novolog 5 units 350 (H)  Novolog 12 units 400 (H)  Novolog 5 units 305 (H)  Novolog 12 units  Lantus 40 units   Review of Glycemic Control Diabetes history:DM2 Outpatient Diabetes medications:Toujeo 70 units daily, Novolog 0-12 units TID with meals Current orders for Inpatient glycemic control:Lantus 40 units daily, Novolog 0-9 units TID with meals, Novolog 0-5 units QHS, Novolog 5 units TID with meals  Inpatient Diabetes Program Recommendations:  Insulin-Basal: Please consider increasing Lantus to 45 units daily. If MD agreeable, please order one time Lantus 5 units x 1 now (since patient has already received Lantus 40 units this morning).  Insulin - Meal Coverage:  Please consider increasing meal coverage to Novolog 10 units TID with meals if patient eats at least 50% of meals.  Thanks, Barnie Alderman, RN, MSN, CDE Diabetes Coordinator Inpatient Diabetes Program [phone] (Team Pager from 8am to 5pm)

## 2018-10-02 ENCOUNTER — Inpatient Hospital Stay: Payer: Medicare HMO | Admitting: Anesthesiology

## 2018-10-02 ENCOUNTER — Encounter: Payer: Self-pay | Admitting: *Deleted

## 2018-10-02 ENCOUNTER — Encounter
Admission: EM | Disposition: A | Discharge status: Home Health | Payer: Self-pay | Source: Home / Self Care | Attending: Internal Medicine

## 2018-10-02 HISTORY — PX: ESOPHAGOGASTRODUODENOSCOPY: SHX5428

## 2018-10-02 LAB — RENAL FUNCTION PANEL
Albumin: 2.9 g/dL — ABNORMAL LOW (ref 3.5–5.0)
Anion gap: 9 (ref 5–15)
BUN: 26 mg/dL — ABNORMAL HIGH (ref 8–23)
CO2: 19 mmol/L — ABNORMAL LOW (ref 22–32)
Calcium: 8.5 mg/dL — ABNORMAL LOW (ref 8.9–10.3)
Chloride: 109 mmol/L (ref 98–111)
Creatinine, Ser: 1.74 mg/dL — ABNORMAL HIGH (ref 0.44–1.00)
GFR calc Af Amer: 33 mL/min — ABNORMAL LOW (ref >60)
GFR calc non Af Amer: 29 mL/min — ABNORMAL LOW (ref >60)
Glucose, Bld: 152 mg/dL — ABNORMAL HIGH (ref 70–99)
Phosphorus: 3.5 mg/dL (ref 2.5–4.6)
Potassium: 4.3 mmol/L (ref 3.5–5.1)
Sodium: 137 mmol/L (ref 135–145)

## 2018-10-02 LAB — GLUCOSE, CAPILLARY
Glucose-Capillary: 103 mg/dL — ABNORMAL HIGH (ref 70–99)
Glucose-Capillary: 175 mg/dL — ABNORMAL HIGH (ref 70–99)
Glucose-Capillary: 260 mg/dL — ABNORMAL HIGH (ref 70–99)

## 2018-10-02 SURGERY — EGD (ESOPHAGOGASTRODUODENOSCOPY)
Anesthesia: General

## 2018-10-02 MED ORDER — PROPOFOL 500 MG/50ML IV EMUL
INTRAVENOUS | Status: DC | PRN
  Administered 2018-10-02: 150 ug/kg/min via INTRAVENOUS

## 2018-10-02 MED ORDER — METOCLOPRAMIDE HCL 5 MG/ML IJ SOLN
5.0000 mg | Freq: Three times a day (TID) | INTRAMUSCULAR | Status: DC
  Administered 2018-10-02 – 2018-10-03 (×3): 5 mg via INTRAVENOUS
  Filled 2018-10-02 (×3): qty 2

## 2018-10-02 MED ORDER — PROPOFOL 10 MG/ML IV BOLUS
INTRAVENOUS | Status: AC
  Filled 2018-10-02: qty 20

## 2018-10-02 MED ORDER — LIDOCAINE HCL (CARDIAC) PF 100 MG/5ML IV SOSY
PREFILLED_SYRINGE | INTRAVENOUS | Status: DC | PRN
  Administered 2018-10-02: 50 mg via INTRAVENOUS

## 2018-10-02 MED ORDER — PROMETHAZINE HCL 25 MG/ML IJ SOLN
12.5000 mg | Freq: Four times a day (QID) | INTRAMUSCULAR | Status: DC | PRN
  Administered 2018-10-02: 12.5 mg via INTRAVENOUS
  Filled 2018-10-02: qty 1

## 2018-10-02 MED ORDER — PROPOFOL 10 MG/ML IV BOLUS
INTRAVENOUS | Status: DC | PRN
  Administered 2018-10-02: 40 mg via INTRAVENOUS

## 2018-10-02 NOTE — Op Note (Signed)
[facility] Patient Name: Andrea [name] Procedure Date: 10/02/2018 11:11 AM MRN: [id number] Account #: [id number] Date of Birth: [birth year] Admit Type: Inpatient Age: 73 Room: [facility] ENDO ROOM 4 Gender: Female Note Status: Finalized Procedure:            Upper GI endoscopy Indications:          Persistent vomiting Providers:            Benay Pike. Alice Reichert MD, MD Referring MD:         [facility] (Referring MD) Medicines:            Propofol per Anesthesia Complications:        No immediate complications. Procedure:            Pre-Anesthesia Assessment:                       - The risks and benefits of the procedure and the                        sedation options and risks were discussed with the                        patient. All questions were answered and informed                        consent was obtained.                       - Patient identification and proposed procedure were                        verified prior to the procedure by the nurse. The                        procedure was verified in the procedure room.                       - ASA Grade Assessment: III - A patient with severe                        systemic disease.                       - After reviewing the risks and benefits, the patient                        was deemed in satisfactory condition to undergo the                        procedure.                       After obtaining informed consent, the endoscope was                        passed under direct vision. Throughout the procedure,                        the patient's blood pressure, pulse, and oxygen  saturations were monitored continuously. The Endoscope                        was introduced through the mouth, and advanced to the                        third part of duodenum. The upper GI endoscopy was                        accomplished without difficulty. The patient  tolerated                        the procedure well. Findings:      The examined esophagus was normal.      Patchy mild inflammation characterized by erythema was found in the       gastric antrum. Biopsies were taken with a cold forceps for Helicobacter       pylori testing.      The cardia and gastric fundus were normal on retroflexion.      The examined duodenum was normal.      The exam was otherwise without abnormality. Impression:           - Normal esophagus.                       - Gastritis. Biopsied.                       - Normal examined duodenum.                       - The examination was otherwise normal. Recommendation:       - Await pathology results.                       - Use Protonix (pantoprazole) 40 mg PO daily.                       - Advance diet as tolerated.                       - Return patient to hospital ward for possible                        discharge same day. Procedure Code(s):    --- Professional ---                       [phone], Esophagogastroduodenoscopy, flexible, transoral;                        with biopsy, single or multiple Diagnosis Code(s):    --- Professional ---                       B14.78, Cyclical vomiting syndrome unrelated to migraine                       K29.70, Gastritis, unspecified, without bleeding CPT copyright 2019 American Medical Association. All rights reserved. The codes documented in this report are preliminary and upon coder review Schalk  be revised to meet current compliance requirements. Efrain Sella MD, MD 10/02/2018 12:05:04 PM This report has been signed electronically. Number  of Addenda: 0 Note Initiated On: 10/02/2018 11:11 AM      [facility]

## 2018-10-02 NOTE — Transfer of Care (Signed)
Immediate Anesthesia Transfer of Care Note  Patient: Andrea [name]  Procedure(s) Performed: ESOPHAGOGASTRODUODENOSCOPY (EGD) (N/A )  Patient Location: PACU  Anesthesia Type:General  Level of Consciousness: sedated  Airway & Oxygen Therapy: Patient Spontanous Breathing and Patient connected to nasal cannula oxygen  Post-op Assessment: Report given to RN and Post -op Vital signs reviewed and stable  Post vital signs: Reviewed and stable  Last Vitals:  Vitals Value Taken Time  BP 147/64 10/02/2018 12:08 PM  Temp 36.2 C 10/02/2018 12:06 PM  Pulse 85 10/02/2018 12:09 PM  Resp 17 10/02/2018 12:09 PM  SpO2 92 % 10/02/2018 12:09 PM  Vitals shown include unvalidated device data.  Last Pain:  Vitals:   10/02/18 1206  TempSrc: Tympanic  PainSc:          Complications: No apparent anesthesia complications

## 2018-10-02 NOTE — Progress Notes (Addendum)
PT Cancellation Note  Patient Details Name: Andrea [name] MRN: [id number] DOB: [birth year]   Cancelled Treatment:    Reason Eval/Treat Not Completed: Patient declined, no reason specified  On arrival to room pt looking poorly and holding emesis bag.  States that she feels terrible and is not doing PT today.  She does report that she has been able to ambulate (w/o AD) to bathroom on occasion and has felt okay.  Per PT exam appears pt should probably be using walker more consistently.  Will try back tomorrow as appropriate.   Kreg Shropshire, DPT 10/02/2018, 4:36 PM

## 2018-10-02 NOTE — Progress Notes (Signed)
Physical Therapy Note  Pt out of room for procedure.  Will continue as appropriate.   Chesley Noon, PTA 10/02/18, 11:28 AM

## 2018-10-02 NOTE — Interval H&P Note (Signed)
History and Physical Interval Note:  10/02/2018 11:44 AM  Andrea [name]  has presented today for surgery, with the diagnosis of Intractable nausea, epigastric abd pain.  The various methods of treatment have been discussed with the patient and family. After consideration of risks, benefits and other options for treatment, the patient has consented to  Procedure(s): ESOPHAGOGASTRODUODENOSCOPY (EGD) (N/A) as a surgical intervention.  The patient's history has been reviewed, patient examined, no change in status, stable for surgery.  I have reviewed the patient's chart and labs.  Questions were answered to the patient's satisfaction.     [place], [place]

## 2018-10-02 NOTE — Care Management Important Message (Signed)
Important Message  Patient Details  Name: Andrea [name] MRN: [id number] Date of Birth: Oct 28, 1945   Medicare Important Message Given:  Yes    Dannette Barbara 10/02/2018, 1:00 PM

## 2018-10-02 NOTE — Progress Notes (Signed)
[place] at [facility]                                                                                                                                                                                  Patient Demographics   Andrea [name], is a 73 y.o. female, DOB - 23-Nov-1945, ZOX:[id number]  Admit date - 09/27/2018   Admitting Physician Dustin Flock, MD  Outpatient Primary MD for the patient is Aline August, MD   LOS - 5  Subjective:  Patient still has some nausea had EGD earlier   Review of Systems:   CONSTITUTIONAL: No documented fever. No fatigue, weakness. No weight gain, no weight loss.  EYES: No blurry or double vision.  ENT: No tinnitus. No postnasal drip. No redness of the oropharynx.  RESPIRATORY: No cough, no wheeze, no hemoptysis. No dyspnea.  CARDIOVASCULAR: No chest pain. No orthopnea. No palpitations. No syncope.  GASTROINTESTINAL: Positive nausea, no vomiting or diarrhea. No abdominal pain. No melena or hematochezia.  GENITOURINARY: No dysuria or hematuria.  ENDOCRINE: No polyuria or nocturia. No heat or cold intolerance.  HEMATOLOGY: No anemia. No bruising. No bleeding.  INTEGUMENTARY: No rashes. No lesions.  MUSCULOSKELETAL: No arthritis. No swelling. No gout.  NEUROLOGIC: No numbness, tingling, or ataxia. No seizure-type activity.  PSYCHIATRIC: No anxiety. No insomnia. No ADD.    Vitals:   Vitals:   10/02/18 1206 10/02/18 1217 10/02/18 1228 10/02/18 1304  BP: (!) 147/64 (!) 149/66 (!) 164/58 (!) 171/79  Pulse: 84 85 88 90  Resp: 19 17 19    Temp: (!) 97.2 F (36.2 C)   98.7 F (37.1 C)  TempSrc: Tympanic   Oral  SpO2: 92% 93% 96% 96%  Weight:      Height:        Wt Readings from Last 3 Encounters:  10/02/18 81.3 kg  03/08/17 84.4 kg  01/05/17 89.4 kg     Intake/Output Summary (Last 24 hours) at 10/02/2018 1329 Last data filed at 10/02/2018 1200 Gross per 24 hour  Intake 100 ml  Output 250 ml  Net -150 ml     Physical Exam:   GENERAL: Pleasant-appearing in no apparent distress.  HEAD, EYES, EARS, NOSE AND THROAT: Atraumatic, normocephalic. Extraocular muscles are intact. Pupils equal and reactive to light. Sclerae anicteric. No conjunctival injection. No oro-pharyngeal erythema.  NECK: Supple. There is no jugular venous distention. No bruits, no lymphadenopathy, no thyromegaly.  HEART: Regular rate and rhythm,. No murmurs, no rubs, no clicks.  LUNGS: Clear to auscultation bilaterally. No rales or rhonchi. No wheezes.  ABDOMEN: Soft, flat, nontender, nondistended. Has good bowel sounds. No hepatosplenomegaly appreciated.  EXTREMITIES: No evidence  of any cyanosis, clubbing, or peripheral edema.  +2 pedal and radial pulses bilaterally.  NEUROLOGIC: The patient is alert, awake, and oriented x3 with no focal motor or sensory deficits appreciated bilaterally.  SKIN: Moist and warm with no rashes appreciated.  Psych: Not anxious, depressed LN: No inguinal LN enlargement    Antibiotics   Anti-infectives (From admission, onward)   Start     Dose/Rate Route Frequency Ordered Stop   09/28/18 0800  cefTRIAXone (ROCEPHIN) 1 g in sodium chloride 0.9 % 100 mL IVPB  Status:  Discontinued     1 g 200 mL/hr over 30 Minutes Intravenous Every 24 hours 09/27/18 0822 09/29/18 0921   09/27/18 0645  cefTRIAXone (ROCEPHIN) 1 g in sodium chloride 0.9 % 100 mL IVPB     1 g 200 mL/hr over 30 Minutes Intravenous STAT 09/27/18 0631 09/27/18 0747      Medications   Scheduled Meds: . [MAR Hold] amLODipine  10 mg Oral Daily  . [MAR Hold] aspirin EC  81 mg Oral Daily  . [MAR Hold] atorvastatin  80 mg Oral Daily  . [MAR Hold] cholecalciferol  1,000 Units Oral Daily  . [MAR Hold] docusate sodium  200 mg Oral BID  . [MAR Hold] gabapentin  300 mg Oral BID  . [MAR Hold] heparin  5,000 Units Subcutaneous Q8H  . [MAR Hold] hydrALAZINE  25 mg Oral Q8H  . [MAR Hold] insulin aspart  0-5 Units Subcutaneous QHS  . [MAR  Hold] insulin aspart  0-9 Units Subcutaneous TID WC  . [MAR Hold] insulin aspart  5 Units Subcutaneous TID WC  . [MAR Hold] insulin glargine  40 Units Subcutaneous Daily  . [MAR Hold] levothyroxine  175 mcg Oral Q0600  . [MAR Hold] mouth rinse  15 mL Mouth Rinse BID  . [MAR Hold] metoCLOPramide (REGLAN) injection  10 mg Intravenous Q8H  . [MAR Hold] nitroGLYCERIN  0.5 inch Topical Q6H  . [MAR Hold] pantoprazole (PROTONIX) IV  40 mg Intravenous Q12H  . [MAR Hold] PARoxetine  40 mg Oral Daily  . [MAR Hold] polyethylene glycol  17 g Oral Daily  . [MAR Hold] sodium chloride flush  3 mL Intravenous Q12H  . [MAR Hold] vitamin B-12  5,000 mcg Oral Daily   Continuous Infusions: . [MAR Hold] sodium chloride    . sodium chloride 40 mL/hr at 10/02/18 0803  . sodium chloride 20 mL/hr at 10/02/18 1042   PRN Meds:.[MAR Hold] sodium chloride, [MAR Hold] acetaminophen, [MAR Hold] albuterol, [MAR Hold] hydrALAZINE, [MAR Hold] ipratropium, [MAR Hold] labetalol, [MAR Hold]  morphine injection, [MAR Hold] ondansetron **OR** [MAR Hold] ondansetron (ZOFRAN) IV, [MAR Hold] phenol, [MAR Hold] prochlorperazine, [MAR Hold] sodium chloride flush, [MAR Hold] traZODone   Data Review:   Micro Results Recent Results (from the past 240 hour(s))  Urine Culture     Status: None   Collection Time: 09/27/18  4:17 AM  Result Value Ref Range Status   Specimen Description   Final    URINE, CATHETERIZED Performed at [facility], [address], [place], [place] [address]    Special Requests   Final    Normal Performed at [facility], [address] [place], [place] [address]    Culture   Final    NO GROWTH Performed at [facility], [place] [address] [place], [place] [address]    Report Status 09/28/2018 FINAL  Final  SARS Coronavirus 2 (CEPHEID - Performed in [facility] hospital lab), [facility]  Status: None   Collection Time: 09/27/18  4:43 AM  Result Value Ref Range Status    SARS Coronavirus 2 NEGATIVE NEGATIVE Final    Comment: (NOTE) If result is NEGATIVE SARS-CoV-2 target nucleic acids are NOT DETECTED. The SARS-CoV-2 RNA is generally detectable in upper and lower  respiratory specimens during the acute phase of infection. The lowest  concentration of SARS-CoV-2 viral copies this assay can detect is 250  copies / mL. A negative result does not preclude SARS-CoV-2 infection  and should not be used as the sole basis for treatment or other  patient management decisions.  A negative result Duerson occur with  improper specimen collection / handling, submission of specimen other  than nasopharyngeal swab, presence of viral mutation(s) within the  areas targeted by this assay, and inadequate number of viral copies  (<250 copies / mL). A negative result must be combined with clinical  observations, patient history, and epidemiological information. If result is POSITIVE SARS-CoV-2 target nucleic acids are DETECTED. The SARS-CoV-2 RNA is generally detectable in upper and lower  respiratory specimens dur ing the acute phase of infection.  Positive  results are indicative of active infection with SARS-CoV-2.  Clinical  correlation with patient history and other diagnostic information is  necessary to determine patient infection status.  Positive results do  not rule out bacterial infection or co-infection with other viruses. If result is PRESUMPTIVE POSTIVE SARS-CoV-2 nucleic acids Mahabir BE PRESENT.   A presumptive positive result was obtained on the submitted specimen  and confirmed on repeat testing.  While 2019 novel coronavirus  (SARS-CoV-2) nucleic acids Kratzke be present in the submitted sample  additional confirmatory testing Swaney be necessary for epidemiological  and / or clinical management purposes  to differentiate between  SARS-CoV-2 and other Sarbecovirus currently known to infect humans.  If clinically indicated additional testing with an alternate test   methodology [phone]) is advised. The SARS-CoV-2 RNA is generally  detectable in upper and lower respiratory sp ecimens during the acute  phase of infection. The expected result is Negative. Fact Sheet for Patients:  StrictlyIdeas.no Fact Sheet for Healthcare Providers: BankingDealers.co.za This test is not yet approved or cleared by the [place] FDA and has been authorized for detection and/or diagnosis of SARS-CoV-2 by FDA under an Emergency Use Authorization (EUA).  This EUA will remain in effect (meaning this test can be used) for the duration of the COVID-19 declaration under Section 564(b)(1) of the Act, 21 U.S.C. section 360bbb-3(b)(1), unless the authorization is terminated or revoked sooner. Performed at [facility], [address] [place], [place] [address]     Radiology Reports Ct Abdomen Pelvis Wo Contrast  Result Date: 09/27/2018 CLINICAL DATA:  Nausea/vomiting, abdominal pain, UTI. EXAM: CT ABDOMEN AND PELVIS WITHOUT CONTRAST TECHNIQUE: Multidetector CT imaging of the abdomen and pelvis was performed following the standard protocol without IV contrast. COMPARISON:  05/19/2011 FINDINGS: Lower chest: Mosaic attenuation at the lung bases. Hepatobiliary: Nodular hepatic contour, raising the possibility of cirrhosis. Status post cholecystectomy. No intrahepatic or extrahepatic duct dilatation. Pancreas: Within normal limits. Spleen: Normal in size. Adrenals/Urinary Tract: Adrenal glands are within normal limits. Left renal atrophy. Right kidney is within normal limits. No renal, ureteral, or bladder calculi. No hydronephrosis. Bladder is within normal limits. Stomach/Bowel: Stomach is notable for a tiny hiatal hernia. No evidence of bowel obstruction. Appendix is not discretely visualized and Sangha be surgically absent. Mild rectal wall thickening (series 2/image 77), without inflammatory changes, favoring underdistention.  Vascular/Lymphatic: No evidence of abdominal aortic aneurysm. Atherosclerotic calcifications of the abdominal aorta and branch vessels. No suspicious abdominopelvic lymphadenopathy. Reproductive: Status post hysterectomy.  No adnexal masses. Other: No abdominopelvic ascites. Musculoskeletal: Mild degenerative changes of the lower thoracic spine. IMPRESSION: No evidence of bowel obstruction. Appendix is not discretely visualized and Weisensel be surgically absent. No CT findings to account for the patient's abdominal pain. Possible cirrhosis. Left renal atrophy. Status post cholecystectomy and hysterectomy. Electronically Signed   By: Julian Hy M.D.   On: 09/27/2018 06:10   US Renal  Result Date: 09/28/2018 CLINICAL DATA:  Acute renal failure EXAM: RENAL / URINARY TRACT ULTRASOUND COMPLETE COMPARISON:  CT scan [place] 16, 2020 FINDINGS: Right Kidney: Renal measurements: Acute renal failure = volume: 9.1 x 4.8 x 5 cm mL. One hundred fifteen Left Kidney: Renal measurements: 5.8 by 4.1 x 3.8 cm = volume: 47 mL. Increased cortical echogenicity and cortical thinning. Bladder: Appears normal for degree of bladder distention. IMPRESSION: The left kidney is atrophic compared to the right, correlating with recent CT findings. No cause for acute renal failure noted. Electronically Signed   By: Dorise Bullion III M.D   On: 09/28/2018 10:58     CBC Recent Labs  Lab 09/27/18 0417 09/27/18 1418 09/28/18 0541  WBC 11.3* 12.5* 12.0*  HGB 15.4* 12.7 12.3  HCT 44.5 38.7 38.7  PLT 237 207 204  MCV 78.2* 81.0 84.1  MCH 27.1 26.6 26.7  MCHC 34.6 32.8 31.8  RDW 14.3 14.5 14.8  LYMPHSABS 1.4  --   --   MONOABS 0.4  --   --   EOSABS 0.0  --   --   BASOSABS 0.1  --   --     Chemistries  Recent Labs  Lab 09/27/18 0417  09/28/18 0541 09/29/18 0556 09/30/18 0505 10/01/18 1120 10/02/18 0517  NA 141  --  142 136 139 135 137  K 3.4*  --  3.5 4.2 4.5 3.7 4.3  CL 103  --  106 99 107 104 109  CO2 25  --  27 26 23  22  19*  GLUCOSE 302*  --  80 344* 355* 238* 152*  BUN 31*  --  43* 60* 51* 37* 26*  CREATININE 1.89*   < > 3.27* 3.57* 2.28* 1.94* 1.74*  CALCIUM 10.1  --  8.7* 8.5* 8.9 9.0 8.5*  AST 27  --   --   --   --   --   --   ALT 14  --   --   --   --   --   --   ALKPHOS 172*  --   --   --   --   --   --   BILITOT 0.9  --   --   --   --   --   --    < > = values in this interval not displayed.   ------------------------------------------------------------------------------------------------------------------ estimated creatinine clearance is 28.2 mL/min (A) (by C-G formula based on SCr of 1.74 mg/dL (H)). ------------------------------------------------------------------------------------------------------------------ No results for input(s): HGBA1C in the last 72 hours. ------------------------------------------------------------------------------------------------------------------ No results for input(s): CHOL, HDL, LDLCALC, TRIG, CHOLHDL, LDLDIRECT in the last 72 hours. ------------------------------------------------------------------------------------------------------------------ No results for input(s): TSH, T4TOTAL, T3FREE, THYROIDAB in the last 72 hours.  Invalid input(s): FREET3 ------------------------------------------------------------------------------------------------------------------ No results for input(s): VITAMINB12, FOLATE, FERRITIN, TIBC, IRON, RETICCTPCT in the last 72 hours.  Coagulation profile No results for input(s): INR, PROTIME in the last 168 hours.  No results  for input(s): DDIMER in the last 72 hours.  Cardiac Enzymes Recent Labs  Lab 09/27/18 0859 09/27/18 1418 09/27/18 2013  TROPONINI 0.19* 0.17* 0.22*   ------------------------------------------------------------------------------------------------------------------ Invalid input(s): POCBNP    Assessment & Plan   IMPRESSION AND PLAN: Patient 73 year old female with history of dementia  presenting with abdominal pain  1.  Abdominal pain nausea symptoms due to gastritis Continue PPIs and Reglan advance diet as tolerated  2.  Acute renal failure on chronic kidney disease  Renal function appears to be at baseline   3.  Accelerated hypertension there is a lot of fluctuation in the blood pressure, continue hydralazine pressure stable  4.  Elevated troponin suspect this is due to demand ischemia we will follow troponin levels cardiology consult if troponin trends upward  5.  Chronic diastolic CHF currently compensated continue to monitor  6.  Diabetes type 2 with elevated blood sugar adjust her insulin as recommended by diabetic coordinator  7.  Dementia monitor mental status  Possible discharge tomorrow     Code Status Orders  (From admission, onward)         Start     Ordered   09/27/18 1207  Full code  Continuous     09/27/18 1207        Code Status History    Date Active Date Inactive Code Status Order ID Comments User Context   03/08/2017 0309 03/09/2017 1455 Full Code [id number]  Vaughan Basta, MD Inpatient   01/05/2017 1629 01/11/2017 1802 Full Code [id number]  Nicholes Mango, MD ED   06/18/2016 0329 06/19/2016 1954 Full Code [id number]  Saundra Shelling, MD Inpatient    Advance Directive Documentation     Most Recent Value  Type of Advance Directive  Living will  Pre-existing out of facility DNR order (yellow form or pink MOST form)  -  "MOST" Form in Place?  -           Consults none  DVT Prophylaxis  Lovenox  Lab Results  Component Value Date   PLT 204 09/28/2018     Time Spent in minutes 35-minute greater than 50% of time spent in care coordination and counseling patient regarding the condition and plan of care.   Dustin Flock M.D on 10/02/2018 at 1:29 PM  Between 7am to 6pm - Pager - [phone]  After 6pm go to [email or web address] - Proofreader  [employer]   Office  [phone]

## 2018-10-02 NOTE — TOC Progression Note (Signed)
Transition of Care [facility]) - Progression Note    Patient Details  Name: Andrea [name] MRN: [id number] Date of Birth: 05-20-1945  [facility] CM/SW Contact  Shade Flood, LCSW Phone Number: 10/02/2018, 1:43 PM  Clinical Narrative:     TOC following. Pt discussed in Progression and chart reviewed by LCSW. Per MD, pt had EGD today. She [place] be stable for dc home tomorrow. Plan remains for [facility] dc.    Expected Discharge Plan: [place] Barriers to Discharge: No Barriers Identified  Expected Discharge Plan and Services Expected Discharge Plan: [place]   Discharge Planning Services: CM Consult Post Acute Care Choice: [place] arrangements for the past 2 months: Single Family Home                                       Social Determinants of Health (SDOH) Interventions    Readmission Risk Interventions No flowsheet data found.

## 2018-10-02 NOTE — Anesthesia Post-op Follow-up Note (Signed)
Anesthesia QCDR form completed.        

## 2018-10-02 NOTE — Anesthesia Procedure Notes (Signed)
Date/Time: 10/02/2018 11:47 AM Performed by: Johnna Acosta, CRNA Pre-anesthesia Checklist: Patient identified, Emergency Drugs available, Suction available, Patient being monitored and Timeout performed Patient Re-evaluated:Patient Re-evaluated prior to induction Oxygen Delivery Method: Nasal cannula Preoxygenation: Pre-oxygenation with 100% oxygen

## 2018-10-02 NOTE — Anesthesia Preprocedure Evaluation (Addendum)
Anesthesia Evaluation  Patient identified by MRN, date of birth, ID band Patient awake    Reviewed: Allergy & Precautions, NPO status , Patient's Chart, lab work & pertinent test results  Airway Mallampati: II  TM Distance: >3 FB     Dental  (+) Poor Dentition, Missing   Pulmonary former smoker,    breath sounds clear to auscultation- rhonchi (-) wheezing      Cardiovascular hypertension, +CHF  + pacemaker  Rhythm:Regular Rate:Normal - Systolic murmurs and - Diastolic murmurs    Neuro/Psych PSYCHIATRIC DISORDERS Dementia    GI/Hepatic PUD, GERD  ,(+) Hepatitis -, Unspecified  Endo/Other  diabetes, Type 2, Insulin DependentHypothyroidism   Renal/GU negative Renal ROS     Musculoskeletal   Abdominal (+) + obese,   Peds  Hematology  (+) anemia ,   Anesthesia Other Findings   Reproductive/Obstetrics                           Anesthesia Physical Anesthesia Plan  ASA: III  Anesthesia Plan: General   Post-op Pain Management:    Induction: Intravenous  PONV Risk Score and Plan: 2 and Propofol infusion  Airway Management Planned: Nasal Cannula  Additional Equipment:   Intra-op Plan:   Post-operative Plan:   Informed Consent: I have reviewed the patients History and Physical, chart, labs and discussed the procedure including the risks, benefits and alternatives for the proposed anesthesia with the patient or authorized representative who has indicated his/her understanding and acceptance.     Dental advisory given  Plan Discussed with: CRNA and Surgeon  Anesthesia Plan Comments:        Anesthesia Quick Evaluation

## 2018-10-03 LAB — GLUCOSE, CAPILLARY
Glucose-Capillary: 179 mg/dL — ABNORMAL HIGH (ref 70–99)
Glucose-Capillary: 210 mg/dL — ABNORMAL HIGH (ref 70–99)

## 2018-10-03 MED ORDER — PANTOPRAZOLE SODIUM 40 MG PO TBEC: 40.0000 mg | DELAYED_RELEASE_TABLET | Freq: Two times a day (BID) | ORAL | 0 refills | Status: AC

## 2018-10-03 MED ORDER — METOCLOPRAMIDE HCL 5 MG PO TABS: 5.0000 mg | ORAL_TABLET | Freq: Three times a day (TID) | ORAL | 11 refills | Status: AC

## 2018-10-03 MED ORDER — HYDRALAZINE HCL 50 MG PO TABS
50.0000 mg | ORAL_TABLET | Freq: Three times a day (TID) | ORAL | Status: DC
  Administered 2018-10-03: 50 mg via ORAL
  Filled 2018-10-03: qty 1

## 2018-10-03 NOTE — Discharge Summary (Signed)
[place] [facility] [place] [facility], 72 y.o., DOB Feb 23, 1946, MRN [id number]. Admission date: 09/27/2018 Discharge Date 10/03/2018 Primary MD Aline August, MD Admitting Physician Dustin Flock, MD  Admission Diagnosis  Generalized abdominal pain [R10.84] Uncontrolled hypertension [I10] Elevated troponin I level [R79.89] Urinary tract infection without hematuria, site unspecified [N39.0] Chronic kidney disease, unspecified CKD stage [N18.9]  Discharge Diagnosis   Active Problems: Abdominal  pain with nausea due to gastritis Acute renal failure on chronic kidney disease Accelerated hypertension Elevated troponin due to demand ischemia Chronic diastolic CHF Diabetes type 2      Hospital Course Patient 73 year old who presented to the hospital with nausea was noted to have acute renal failure.  She was adequately hydrated.  She had a CT scan of the abdomen which failed to show any pathology for her symptoms.  Patient's liver function test and lipase all were normal.  She was admitted and started on antiemetics.  Patient continued to have symptoms therefore I consulted GI to see her.  Patient underwent EGD which showed gastritis she has been treated with PPIs.  I strongly recommend patient eat so she can be discharged home.  If her symptoms do not improve at discharge she will need to continue to follow-up with GI.  I have also placed her on Reglan for possible gastroparesis.            Consults  GI  Significant Tests:  See full reports for all details    Ct Abdomen Pelvis Wo Contrast  Result Date: 09/27/2018 CLINICAL DATA:  Nausea/vomiting, abdominal pain, UTI. EXAM: CT ABDOMEN AND PELVIS WITHOUT CONTRAST TECHNIQUE: Multidetector CT imaging of the abdomen and pelvis was performed following the standard protocol without IV contrast. COMPARISON:  05/19/2011 FINDINGS: Lower chest: Mosaic attenuation at the lung bases. Hepatobiliary: Nodular hepatic  contour, raising the possibility of cirrhosis. Status post cholecystectomy. No intrahepatic or extrahepatic duct dilatation. Pancreas: Within normal limits. Spleen: Normal in size. Adrenals/Urinary Tract: Adrenal glands are within normal limits. Left renal atrophy. Right kidney is within normal limits. No renal, ureteral, or bladder calculi. No hydronephrosis. Bladder is within normal limits. Stomach/Bowel: Stomach is notable for a tiny hiatal hernia. No evidence of bowel obstruction. Appendix is not discretely visualized and Sanluis be surgically absent. Mild rectal wall thickening (series 2/image 77), without inflammatory changes, favoring underdistention. Vascular/Lymphatic: No evidence of abdominal aortic aneurysm. Atherosclerotic calcifications of the abdominal aorta and branch vessels. No suspicious abdominopelvic lymphadenopathy. Reproductive: Status post hysterectomy.  No adnexal masses. Other: No abdominopelvic ascites. Musculoskeletal: Mild degenerative changes of the lower thoracic spine. IMPRESSION: No evidence of bowel obstruction. Appendix is not discretely visualized and Leclere be surgically absent. No CT findings to account for the patient's abdominal pain. Possible cirrhosis. Left renal atrophy. Status post cholecystectomy and hysterectomy. Electronically Signed   By: Julian Hy M.D.   On: 09/27/2018 06:10   US Renal  Result Date: 09/28/2018 CLINICAL DATA:  Acute renal failure EXAM: RENAL / URINARY TRACT ULTRASOUND COMPLETE COMPARISON:  CT scan Muff 16, 2020 FINDINGS: Right Kidney: Renal measurements: Acute renal failure = volume: 9.1 x 4.8 x 5 cm mL. One hundred fifteen Left Kidney: Renal measurements: 5.8 by 4.1 x 3.8 cm = volume: 47 mL. Increased cortical echogenicity and cortical thinning. Bladder: Appears normal for degree of bladder distention. IMPRESSION: The left kidney is atrophic compared to the right, correlating with recent CT findings. No cause for acute renal failure noted.  Electronically Signed  By: Dorise Bullion III M.D   On: 09/28/2018 10:58       Today   Subjective:   Andrea [name]   patient continues to have some nausea  Objective:   Blood pressure (!) 180/72, pulse 87, temperature 98.3 F (36.8 C), temperature source Oral, resp. rate 19, height 5\' 1"  (1.549 m), weight 82.1 kg, SpO2 90 %.  .  Intake/Output Summary (Last 24 hours) at 10/03/2018 1231 Last data filed at 10/03/2018 0600 Gross per 24 hour  Intake 2486.34 ml  Output 2 ml  Net 2484.34 ml    Exam VITAL SIGNS: Blood pressure (!) 180/72, pulse 87, temperature 98.3 F (36.8 C), temperature source Oral, resp. rate 19, height 5\' 1"  (1.549 m), weight 82.1 kg, SpO2 90 %.  GENERAL:  73 y.o.-year-old patient lying in the bed with no acute distress.  EYES: Pupils equal, round, reactive to light and accommodation. No scleral icterus. Extraocular muscles intact.  HEENT: Head atraumatic, normocephalic. Oropharynx and nasopharynx clear.  NECK:  Supple, no jugular venous distention. No thyroid enlargement, no tenderness.  LUNGS: Normal breath sounds bilaterally, no wheezing, rales,rhonchi or crepitation. No use of accessory muscles of respiration.  CARDIOVASCULAR: S1, S2 normal. No murmurs, rubs, or gallops.  ABDOMEN: Soft, nontender, nondistended. Bowel sounds present. No organomegaly or mass.  EXTREMITIES: No pedal edema, cyanosis, or clubbing.  NEUROLOGIC: Cranial nerves II through XII are intact. Muscle strength 5/5 in all extremities. Sensation intact. Gait not checked.  PSYCHIATRIC: The patient is alert and oriented x 3.  SKIN: No obvious rash, lesion, or ulcer.   Data Review     CBC w Diff:  Lab Results  Component Value Date   WBC 12.0 (H) 09/28/2018   HGB 12.3 09/28/2018   HGB 10.8 (L) 03/08/2014   HCT 38.7 09/28/2018   HCT 33.3 (L) 03/08/2014   PLT 204 09/28/2018   PLT 220 03/08/2014   LYMPHOPCT 13 09/27/2018   LYMPHOPCT 23.1 03/08/2014   MONOPCT 4 09/27/2018   MONOPCT 7.3  03/08/2014   EOSPCT 0 09/27/2018   EOSPCT 4.8 03/08/2014   BASOPCT 1 09/27/2018   BASOPCT 0.8 03/08/2014   CMP:  Lab Results  Component Value Date   NA 137 10/02/2018   NA 136 03/08/2014   K 4.3 10/02/2018   K 4.8 03/08/2014   CL 109 10/02/2018   CL 102 03/08/2014   CO2 19 (L) 10/02/2018   CO2 26 03/08/2014   BUN 26 (H) 10/02/2018   BUN 21 (H) 03/08/2014   CREATININE 1.74 (H) 10/02/2018   CREATININE 1.24 03/08/2014   CREATININE 1.12 (H) 11/21/2012   GLU 135 04/03/2011   PROT 8.1 09/27/2018   PROT 7.4 03/08/2014   ALBUMIN 2.9 (L) 10/02/2018   ALBUMIN 3.3 (L) 03/08/2014   BILITOT 0.9 09/27/2018   BILITOT 0.6 03/08/2014   ALKPHOS 172 (H) 09/27/2018   ALKPHOS 116 03/08/2014   AST 27 09/27/2018   AST 31 03/08/2014   ALT 14 09/27/2018   ALT 21 03/08/2014  .  Micro Results Recent Results (from the past 240 hour(s))  Urine Culture     Status: None   Collection Time: 09/27/18  4:17 AM  Result Value Ref Range Status   Specimen Description   Final    URINE, CATHETERIZED Performed at [facility], [address], [place], [place] [address]    Special Requests   Final    Normal Performed at [facility], [address] [place], [place] [address]  Culture   Final    NO GROWTH Performed at [facility], [place] [address], [place], [place] [address]    Report Status 09/28/2018 FINAL  Final  SARS Coronavirus 2 (CEPHEID - Performed in [place] hospital lab), Hosp Order     Status: None   Collection Time: 09/27/18  4:43 AM  Result Value Ref Range Status   SARS Coronavirus 2 NEGATIVE NEGATIVE Final    Comment: (NOTE) If result is NEGATIVE SARS-CoV-2 target nucleic acids are NOT DETECTED. The SARS-CoV-2 RNA is generally detectable in upper and lower  respiratory specimens during the acute phase of infection. The lowest  concentration of SARS-CoV-2 viral copies this assay can detect is 250  copies / mL. A negative result does not preclude  SARS-CoV-2 infection  and should not be used as the sole basis for treatment or other  patient management decisions.  A negative result Towry occur with  improper specimen collection / handling, submission of specimen other  than nasopharyngeal swab, presence of viral mutation(s) within the  areas targeted by this assay, and inadequate number of viral copies  (<250 copies / mL). A negative result must be combined with clinical  observations, patient history, and epidemiological information. If result is POSITIVE SARS-CoV-2 target nucleic acids are DETECTED. The SARS-CoV-2 RNA is generally detectable in upper and lower  respiratory specimens dur ing the acute phase of infection.  Positive  results are indicative of active infection with SARS-CoV-2.  Clinical  correlation with patient history and other diagnostic information is  necessary to determine patient infection status.  Positive results do  not rule out bacterial infection or co-infection with other viruses. If result is PRESUMPTIVE POSTIVE SARS-CoV-2 nucleic acids Lamos BE PRESENT.   A presumptive positive result was obtained on the submitted specimen  and confirmed on repeat testing.  While 2019 novel coronavirus  (SARS-CoV-2) nucleic acids Bonaparte be present in the submitted sample  additional confirmatory testing Ruderman be necessary for epidemiological  and / or clinical management purposes  to differentiate between  SARS-CoV-2 and other Sarbecovirus currently known to infect humans.  If clinically indicated additional testing with an alternate test  methodology [phone]) is advised. The SARS-CoV-2 RNA is generally  detectable in upper and lower respiratory sp ecimens during the acute  phase of infection. The expected result is Negative. Fact Sheet for Patients:  StrictlyIdeas.no Fact Sheet for Healthcare Providers: BankingDealers.co.za This test is not yet approved or cleared by the  [place] FDA and has been authorized for detection and/or diagnosis of SARS-CoV-2 by FDA under an Emergency Use Authorization (EUA).  This EUA will remain in effect (meaning this test can be used) for the duration of the COVID-19 declaration under Section 564(b)(1) of the Act, 21 U.S.C. section 360bbb-3(b)(1), unless the authorization is terminated or revoked sooner. Performed at [facility], [address], [place], [place] [address]         Code Status Orders  (From admission, onward)         Start     Ordered   09/27/18 1207  Full code  Continuous     09/27/18 1207        Code Status History    Date Active Date Inactive Code Status Order ID Comments User Context   03/08/2017 0309 03/09/2017 1455 Full Code [id number]  Vaughan Basta, MD Inpatient   01/05/2017 1629 01/11/2017 1802 Full Code [id number]  Nicholes Mango, MD ED   06/18/2016 0329 06/19/2016 1954 Full  Code [id number]  Saundra Shelling, MD Inpatient    Advance Directive Documentation     Most Recent Value  Type of Advance Directive  Living will  Pre-existing out of facility DNR order (yellow form or pink MOST form)  -  "MOST" Form in Place?  -          Follow-up Information    Aline August, MD In 1 week.   Specialty:  Internal Medicine Why:  hosp f/u Contact information: [address] [phone], [place] [address] [phone]           Discharge Medications   Allergies as of 10/03/2018      Reactions   Watermelon Concentrate Anaphylaxis   Ace Inhibitors    Celebrex [celecoxib] Rash   Penicillins Rash   Has patient had a PCN reaction causing immediate rash, facial/tongue/throat swelling, SOB or lightheadedness with hypotension: Yes Has patient had a PCN reaction causing severe rash involving mucus membranes or skin necrosis:  Has patient had a PCN reaction that required hospitalization: No Has patient had a PCN reaction occurring within the last 10 years:  No If all of the above answers are "NO", then [place] proceed with Cephalosporin use. No   Sulfa Antibiotics Rash      Medication List    STOP taking these medications   bumetanide 1 MG tablet Commonly known as:  BUMEX     TAKE these medications   acetaminophen 325 MG tablet Commonly known as:  TYLENOL Take 650 mg by mouth 3 (three) times daily as needed for mild pain, fever or headache.   albuterol (2.5 MG/3ML) 0.083% nebulizer solution Commonly known as:  PROVENTIL Take 2.5 mg by nebulization every 6 (six) hours as needed for wheezing or shortness of breath.   amLODipine 10 MG tablet Commonly known as:  NORVASC TAKE ONE TABLET BY MOUTH ONCE DAILY *NEEDS APPOINTMENT FOR FURTHER REFILLS* What changed:  See the new instructions.   aspirin 81 MG tablet Take 81 mg by mouth daily.   atorvastatin 20 MG tablet Commonly known as:  LIPITOR Take 20 mg by mouth every evening.   carvedilol 25 MG tablet Commonly known as:  COREG Take 1 tablet by mouth 2 (two) times daily with a meal.   cholecalciferol 1000 units tablet Commonly known as:  VITAMIN D Take 1,000 Units by mouth daily.   cloNIDine 0.3 MG tablet Commonly known as:  CATAPRES Take 0.3 mg by mouth 2 (two) times daily.   Eliquis 5 MG Tabs tablet Generic drug:  apixaban Take 5 mg by mouth 2 (two) times daily.   gabapentin 100 MG capsule Commonly known as:  NEURONTIN Take 100 mg by mouth 3 (three) times daily.   insulin aspart 100 UNIT/ML injection Commonly known as:  novoLOG Inject 3-15 Units into the skin 3 (three) times daily before meals. Sliding scale   Insulin Glargine 300 UNIT/ML Sopn Commonly known as:  Toujeo SoloStar Inject 30 Units into the skin daily. What changed:  how much to take   levothyroxine 175 MCG tablet Commonly known as:  SYNTHROID Take 1 tablet by mouth daily. Given one hour prior to any other medications   losartan 50 MG tablet Commonly known as:  COZAAR Take 1 tablet by mouth  daily.   metoCLOPramide 5 MG tablet Commonly known as:  Reglan Take 1 tablet (5 mg total) by mouth 4 (four) times daily -  before meals and at bedtime.   pantoprazole 40 MG  tablet Commonly known as:  PROTONIX Take 1 tablet (40 mg total) by mouth 2 (two) times daily. What changed:  when to take this   PARoxetine 40 MG tablet Commonly known as:  PAXIL Take 40 mg by mouth daily.   traZODone 50 MG tablet Commonly known as:  DESYREL Take 50 mg by mouth at bedtime as needed for sleep.   vitamin B-12 1000 MCG tablet Commonly known as:  CYANOCOBALAMIN Take 5,000 mcg by mouth daily.          Total Time in preparing paper work, data evaluation and todays exam - 76 minutes  Dustin Flock M.D on 10/03/2018 at 12:31 PM [place]  [phone]

## 2018-10-03 NOTE — Progress Notes (Signed)
[facility]  ROUNDING NOTE   Subjective:  Patient appears to be improving. Creatinine down to 1.7.    Objective:  Vital signs in last 24 hours:  Temp:  [98.3 F (36.8 C)-98.7 F (37.1 C)] 98.3 F (36.8 C) (05/22 0751) Pulse Rate:  [84-96] 85 (05/22 1232) BP: (149-193)/(51-88) 149/51 (05/22 1232) SpO2:  [90 %-96 %] 90 % (05/22 0751) Weight:  [82.1 kg] 82.1 kg (05/22 0441)  Weight change: 0.816 kg Filed Weights   10/01/18 0500 10/02/18 0411 10/03/18 0441  Weight: 81.2 kg 81.3 kg 82.1 kg    Intake/Output: I/O last 3 completed shifts: In: 2586.3 [I.V.:2586.3] Out: 252 [Urine:252]   Intake/Output this shift:  No intake/output data recorded.  Physical Exam: General: No acute distress  Head: Normocephalic, atraumatic. Moist oral mucosal membranes  Eyes: Anicteric  Neck: Supple, trachea midline  Lungs:  Clear to auscultation, normal effort  Heart: S1S2 no rubs  Abdomen:  Soft, nontender, bowel sounds present  Extremities: Trace peripheral edema.  Neurologic: Awake, alert, conversant  Skin: No lesions       Basic Metabolic Panel: Recent Labs  Lab 09/28/18 0541 09/29/18 0556 09/30/18 0505 10/01/18 1120 10/02/18 0517  NA 142 136 139 135 137  K 3.5 4.2 4.5 3.7 4.3  CL 106 99 107 104 109  CO2 27 26 23 22  19*  GLUCOSE 80 344* 355* 238* 152*  BUN 43* 60* 51* 37* 26*  CREATININE 3.27* 3.57* 2.28* 1.94* 1.74*  CALCIUM 8.7* 8.5* 8.9 9.0 8.5*  PHOS  --   --  3.3  --  3.5    Liver Function Tests: Recent Labs  Lab 09/27/18 0417 09/30/18 0505 10/02/18 0517  AST 27  --   --   ALT 14  --   --   ALKPHOS 172*  --   --   BILITOT 0.9  --   --   PROT 8.1  --   --   ALBUMIN 4.2 3.1* 2.9*   Recent Labs  Lab 09/27/18 0417  LIPASE 25   No results for input(s): AMMONIA in the last 168 hours.  CBC: Recent Labs  Lab 09/27/18 0417 09/27/18 1418 09/28/18 0541  WBC 11.3* 12.5* 12.0*  NEUTROABS 9.3*  --   --   HGB 15.4* 12.7 12.3  HCT 44.5 38.7  38.7  MCV 78.2* 81.0 84.1  PLT 237 207 204    Cardiac Enzymes: Recent Labs  Lab 09/27/18 0417 09/27/18 0859 09/27/18 1418 09/27/18 2013  TROPONINI 0.11* 0.19* 0.17* 0.22*    BNP: Invalid input(s): POCBNP  CBG: Recent Labs  Lab 10/02/18 0753 10/02/18 1356 10/02/18 2248 10/03/18 0752 10/03/18 1143  GLUCAP 103* 175* 260* 179* 210*    Microbiology: Results for orders placed or performed during the hospital encounter of 09/27/18  Urine Culture     Status: None   Collection Time: 09/27/18  4:17 AM  Result Value Ref Range Status   Specimen Description   Final    URINE, CATHETERIZED Performed at [facility], [address], [place], [place] [address]    Special Requests   Final    Normal Performed at [facility], [address], [place], [place] [address]    Culture   Final    NO GROWTH Performed at [facility], [place] [address] [place],  [address]    Report Status 09/28/2018 FINAL  Final  SARS Coronavirus 2 (CEPHEID - Performed in [facility] hospital lab), [facility]  Status: None   Collection Time: 09/27/18  4:43 AM  Result Value Ref Range Status   SARS Coronavirus 2 NEGATIVE NEGATIVE Final    Comment: (NOTE) If result is NEGATIVE SARS-CoV-2 target nucleic acids are NOT DETECTED. The SARS-CoV-2 RNA is generally detectable in upper and lower  respiratory specimens during the acute phase of infection. The lowest  concentration of SARS-CoV-2 viral copies this assay can detect is 250  copies / mL. A negative result does not preclude SARS-CoV-2 infection  and should not be used as the sole basis for treatment or other  patient management decisions.  A negative result Guerrini occur with  improper specimen collection / handling, submission of specimen other  than nasopharyngeal swab, presence of viral mutation(s) within the  areas targeted by this assay, and inadequate number of viral copies  (<250 copies / mL). A negative result  must be combined with clinical  observations, patient history, and epidemiological information. If result is POSITIVE SARS-CoV-2 target nucleic acids are DETECTED. The SARS-CoV-2 RNA is generally detectable in upper and lower  respiratory specimens dur ing the acute phase of infection.  Positive  results are indicative of active infection with SARS-CoV-2.  Clinical  correlation with patient history and other diagnostic information is  necessary to determine patient infection status.  Positive results do  not rule out bacterial infection or co-infection with other viruses. If result is PRESUMPTIVE POSTIVE SARS-CoV-2 nucleic acids Googe BE PRESENT.   A presumptive positive result was obtained on the submitted specimen  and confirmed on repeat testing.  While 2019 novel coronavirus  (SARS-CoV-2) nucleic acids Manahan be present in the submitted sample  additional confirmatory testing Civil be necessary for epidemiological  and / or clinical management purposes  to differentiate between  SARS-CoV-2 and other Sarbecovirus currently known to infect humans.  If clinically indicated additional testing with an alternate test  methodology [phone]) is advised. The SARS-CoV-2 RNA is generally  detectable in upper and lower respiratory sp ecimens during the acute  phase of infection. The expected result is Negative. Fact Sheet for Patients:  StrictlyIdeas.no Fact Sheet for Healthcare Providers: BankingDealers.co.za This test is not yet approved or cleared by the [place] FDA and has been authorized for detection and/or diagnosis of SARS-CoV-2 by FDA under an Emergency Use Authorization (EUA).  This EUA will remain in effect (meaning this test can be used) for the duration of the COVID-19 declaration under Section 564(b)(1) of the Act, 21 U.S.C. section 360bbb-3(b)(1), unless the authorization is terminated or revoked sooner. Performed at [facility], [address] [place], [place] [address]     Coagulation Studies: No results for input(s): LABPROT, INR in the last 72 hours.  Urinalysis: No results for input(s): COLORURINE, LABSPEC, PHURINE, GLUCOSEU, HGBUR, BILIRUBINUR, KETONESUR, PROTEINUR, UROBILINOGEN, NITRITE, LEUKOCYTESUR in the last 72 hours.  Invalid input(s): APPERANCEUR    Imaging: No results found.   Medications:   . sodium chloride     . amLODipine  10 mg Oral Daily  . aspirin EC  81 mg Oral Daily  . atorvastatin  80 mg Oral Daily  . cholecalciferol  1,000 Units Oral Daily  . docusate sodium  200 mg Oral BID  . gabapentin  300 mg Oral BID  . heparin  5,000 Units Subcutaneous Q8H  . hydrALAZINE  50 mg Oral Q8H  . insulin aspart  0-5 Units Subcutaneous QHS  . insulin aspart  0-9 Units Subcutaneous TID WC  . insulin aspart  5  Units Subcutaneous TID WC  . insulin glargine  40 Units Subcutaneous Daily  . levothyroxine  175 mcg Oral Q0600  . mouth rinse  15 mL Mouth Rinse BID  . metoCLOPramide (REGLAN) injection  5 mg Intravenous Q8H  . pantoprazole (PROTONIX) IV  40 mg Intravenous Q12H  . PARoxetine  40 mg Oral Daily  . polyethylene glycol  17 g Oral Daily  . sodium chloride flush  3 mL Intravenous Q12H  . vitamin B-12  5,000 mcg Oral Daily   sodium chloride, acetaminophen, albuterol, hydrALAZINE, ipratropium, labetalol, morphine injection, ondansetron **OR** ondansetron (ZOFRAN) IV, phenol, prochlorperazine, promethazine, sodium chloride flush, traZODone  Assessment/ Plan:  73 y.o. female with a PMHx of Congestive heart failure, hypertension, hypothyroidism, diabetes mellitus 2, nonalcoholic fatty liver disease, obesity, peptic ulcer disease, chronic kidney disease stage IV who was admitted for abdominal pain, nausea.  Now found to have acute renal failure.  1.  Acute renal failure/chronic kidney disease stage IV baseline EGFR 26/diabetes mellitus type 2 with chronic kidney  disease/proteinuria.  Patient with atrophic left kidney.   -Kidney function continues to improve.  Creatinine down to 1.7.  IV fluids have been stopped.  2.  Hypertension.  Blood pressure currently 149/51.  Continue amlodipine and hydralazine for blood pressure control.  Add ACE inhibitor or ARB as an outpatient.   LOS: 6 Tarnisha Kachmar 5/22/202012:59 PM

## 2018-10-03 NOTE — Progress Notes (Signed)
Patient discharged, husband picked up. Discharge instructions reviewed with patient and husband. No questions.  Andrea [name]

## 2018-10-04 DIAGNOSIS — N184 Chronic kidney disease, stage 4 (severe): Secondary | ICD-10-CM | POA: Diagnosis not present

## 2018-10-04 DIAGNOSIS — J439 Emphysema, unspecified: Secondary | ICD-10-CM | POA: Diagnosis not present

## 2018-10-04 DIAGNOSIS — E039 Hypothyroidism, unspecified: Secondary | ICD-10-CM | POA: Diagnosis not present

## 2018-10-04 DIAGNOSIS — I272 Pulmonary hypertension, unspecified: Secondary | ICD-10-CM | POA: Diagnosis not present

## 2018-10-04 DIAGNOSIS — I5032 Chronic diastolic (congestive) heart failure: Secondary | ICD-10-CM | POA: Diagnosis not present

## 2018-10-04 DIAGNOSIS — I2510 Atherosclerotic heart disease of native coronary artery without angina pectoris: Secondary | ICD-10-CM | POA: Diagnosis not present

## 2018-10-04 DIAGNOSIS — I130 Hypertensive heart and chronic kidney disease with heart failure and stage 1 through stage 4 chronic kidney disease, or unspecified chronic kidney disease: Secondary | ICD-10-CM | POA: Diagnosis not present

## 2018-10-04 DIAGNOSIS — F0280 Dementia in other diseases classified elsewhere without behavioral disturbance: Secondary | ICD-10-CM | POA: Diagnosis not present

## 2018-10-04 DIAGNOSIS — E1122 Type 2 diabetes mellitus with diabetic chronic kidney disease: Secondary | ICD-10-CM | POA: Diagnosis not present

## 2018-10-05 NOTE — Anesthesia Postprocedure Evaluation (Signed)
Anesthesia Post Note  Patient: Andrea [name]  Procedure(s) Performed: ESOPHAGOGASTRODUODENOSCOPY (EGD) (N/A )  Patient location during evaluation: PACU Anesthesia Type: General Level of consciousness: awake and alert and oriented Pain management: pain level controlled Vital Signs Assessment: post-procedure vital signs reviewed and stable Respiratory status: spontaneous breathing Cardiovascular status: blood pressure returned to baseline Anesthetic complications: no     Last Vitals:  Vitals:   10/03/18 0751 10/03/18 1232  BP: (!) 180/72 (!) 149/51  Pulse: 87 85  Resp:    Temp: 36.8 C   SpO2: 90%     Last Pain:  Vitals:   10/03/18 0751  TempSrc: Oral  PainSc: 0-No pain                 Chantilly Linskey

## 2018-10-07 LAB — SURGICAL PATHOLOGY

## 2018-10-10 DIAGNOSIS — I5032 Chronic diastolic (congestive) heart failure: Secondary | ICD-10-CM | POA: Diagnosis not present

## 2018-10-10 DIAGNOSIS — E039 Hypothyroidism, unspecified: Secondary | ICD-10-CM | POA: Diagnosis not present

## 2018-10-10 DIAGNOSIS — J439 Emphysema, unspecified: Secondary | ICD-10-CM | POA: Diagnosis not present

## 2018-10-10 DIAGNOSIS — I272 Pulmonary hypertension, unspecified: Secondary | ICD-10-CM | POA: Diagnosis not present

## 2018-10-10 DIAGNOSIS — N184 Chronic kidney disease, stage 4 (severe): Secondary | ICD-10-CM | POA: Diagnosis not present

## 2018-10-10 DIAGNOSIS — F0280 Dementia in other diseases classified elsewhere without behavioral disturbance: Secondary | ICD-10-CM | POA: Diagnosis not present

## 2018-10-10 DIAGNOSIS — E1122 Type 2 diabetes mellitus with diabetic chronic kidney disease: Secondary | ICD-10-CM | POA: Diagnosis not present

## 2018-10-10 DIAGNOSIS — I130 Hypertensive heart and chronic kidney disease with heart failure and stage 1 through stage 4 chronic kidney disease, or unspecified chronic kidney disease: Secondary | ICD-10-CM | POA: Diagnosis not present

## 2018-10-10 DIAGNOSIS — I2510 Atherosclerotic heart disease of native coronary artery without angina pectoris: Secondary | ICD-10-CM | POA: Diagnosis not present

## 2018-10-14 DIAGNOSIS — E039 Hypothyroidism, unspecified: Secondary | ICD-10-CM | POA: Diagnosis not present

## 2018-10-14 DIAGNOSIS — I2510 Atherosclerotic heart disease of native coronary artery without angina pectoris: Secondary | ICD-10-CM | POA: Diagnosis not present

## 2018-10-14 DIAGNOSIS — I130 Hypertensive heart and chronic kidney disease with heart failure and stage 1 through stage 4 chronic kidney disease, or unspecified chronic kidney disease: Secondary | ICD-10-CM | POA: Diagnosis not present

## 2018-10-14 DIAGNOSIS — I272 Pulmonary hypertension, unspecified: Secondary | ICD-10-CM | POA: Diagnosis not present

## 2018-10-14 DIAGNOSIS — N184 Chronic kidney disease, stage 4 (severe): Secondary | ICD-10-CM | POA: Diagnosis not present

## 2018-10-14 DIAGNOSIS — J439 Emphysema, unspecified: Secondary | ICD-10-CM | POA: Diagnosis not present

## 2018-10-14 DIAGNOSIS — F0280 Dementia in other diseases classified elsewhere without behavioral disturbance: Secondary | ICD-10-CM | POA: Diagnosis not present

## 2018-10-14 DIAGNOSIS — E1122 Type 2 diabetes mellitus with diabetic chronic kidney disease: Secondary | ICD-10-CM | POA: Diagnosis not present

## 2018-10-14 DIAGNOSIS — I5032 Chronic diastolic (congestive) heart failure: Secondary | ICD-10-CM | POA: Diagnosis not present

## 2018-10-29 DIAGNOSIS — E1122 Type 2 diabetes mellitus with diabetic chronic kidney disease: Secondary | ICD-10-CM | POA: Diagnosis not present

## 2018-10-29 DIAGNOSIS — I130 Hypertensive heart and chronic kidney disease with heart failure and stage 1 through stage 4 chronic kidney disease, or unspecified chronic kidney disease: Secondary | ICD-10-CM | POA: Diagnosis not present

## 2018-10-29 DIAGNOSIS — I5032 Chronic diastolic (congestive) heart failure: Secondary | ICD-10-CM | POA: Diagnosis not present

## 2018-10-29 DIAGNOSIS — F0280 Dementia in other diseases classified elsewhere without behavioral disturbance: Secondary | ICD-10-CM | POA: Diagnosis not present

## 2018-10-29 DIAGNOSIS — J439 Emphysema, unspecified: Secondary | ICD-10-CM | POA: Diagnosis not present

## 2018-10-29 DIAGNOSIS — I2510 Atherosclerotic heart disease of native coronary artery without angina pectoris: Secondary | ICD-10-CM | POA: Diagnosis not present

## 2018-10-29 DIAGNOSIS — I272 Pulmonary hypertension, unspecified: Secondary | ICD-10-CM | POA: Diagnosis not present

## 2018-10-29 DIAGNOSIS — N184 Chronic kidney disease, stage 4 (severe): Secondary | ICD-10-CM | POA: Diagnosis not present

## 2018-10-29 DIAGNOSIS — E039 Hypothyroidism, unspecified: Secondary | ICD-10-CM | POA: Diagnosis not present

## 2018-10-31 DIAGNOSIS — F0280 Dementia in other diseases classified elsewhere without behavioral disturbance: Secondary | ICD-10-CM | POA: Diagnosis not present

## 2018-10-31 DIAGNOSIS — I272 Pulmonary hypertension, unspecified: Secondary | ICD-10-CM | POA: Diagnosis not present

## 2018-10-31 DIAGNOSIS — I130 Hypertensive heart and chronic kidney disease with heart failure and stage 1 through stage 4 chronic kidney disease, or unspecified chronic kidney disease: Secondary | ICD-10-CM | POA: Diagnosis not present

## 2018-10-31 DIAGNOSIS — E1122 Type 2 diabetes mellitus with diabetic chronic kidney disease: Secondary | ICD-10-CM | POA: Diagnosis not present

## 2018-10-31 DIAGNOSIS — N184 Chronic kidney disease, stage 4 (severe): Secondary | ICD-10-CM | POA: Diagnosis not present

## 2018-10-31 DIAGNOSIS — J439 Emphysema, unspecified: Secondary | ICD-10-CM | POA: Diagnosis not present

## 2018-10-31 DIAGNOSIS — I2510 Atherosclerotic heart disease of native coronary artery without angina pectoris: Secondary | ICD-10-CM | POA: Diagnosis not present

## 2018-10-31 DIAGNOSIS — I5032 Chronic diastolic (congestive) heart failure: Secondary | ICD-10-CM | POA: Diagnosis not present

## 2018-10-31 DIAGNOSIS — E039 Hypothyroidism, unspecified: Secondary | ICD-10-CM | POA: Diagnosis not present

## 2018-11-04 DIAGNOSIS — R112 Nausea with vomiting, unspecified: Secondary | ICD-10-CM | POA: Diagnosis not present

## 2018-11-04 DIAGNOSIS — I130 Hypertensive heart and chronic kidney disease with heart failure and stage 1 through stage 4 chronic kidney disease, or unspecified chronic kidney disease: Secondary | ICD-10-CM | POA: Diagnosis not present

## 2018-11-04 DIAGNOSIS — I2510 Atherosclerotic heart disease of native coronary artery without angina pectoris: Secondary | ICD-10-CM | POA: Diagnosis not present

## 2018-11-04 DIAGNOSIS — E1122 Type 2 diabetes mellitus with diabetic chronic kidney disease: Secondary | ICD-10-CM | POA: Diagnosis not present

## 2018-11-04 DIAGNOSIS — J439 Emphysema, unspecified: Secondary | ICD-10-CM | POA: Diagnosis not present

## 2018-11-04 DIAGNOSIS — F0280 Dementia in other diseases classified elsewhere without behavioral disturbance: Secondary | ICD-10-CM | POA: Diagnosis not present

## 2018-11-04 DIAGNOSIS — E039 Hypothyroidism, unspecified: Secondary | ICD-10-CM | POA: Diagnosis not present

## 2018-11-04 DIAGNOSIS — E1140 Type 2 diabetes mellitus with diabetic neuropathy, unspecified: Secondary | ICD-10-CM | POA: Diagnosis not present

## 2018-11-04 DIAGNOSIS — I5032 Chronic diastolic (congestive) heart failure: Secondary | ICD-10-CM | POA: Diagnosis not present

## 2018-11-04 DIAGNOSIS — N184 Chronic kidney disease, stage 4 (severe): Secondary | ICD-10-CM | POA: Diagnosis not present

## 2018-11-04 DIAGNOSIS — I272 Pulmonary hypertension, unspecified: Secondary | ICD-10-CM | POA: Diagnosis not present

## 2018-11-04 DIAGNOSIS — Z794 Long term (current) use of insulin: Secondary | ICD-10-CM | POA: Diagnosis not present

## 2018-11-07 DIAGNOSIS — F0280 Dementia in other diseases classified elsewhere without behavioral disturbance: Secondary | ICD-10-CM | POA: Diagnosis not present

## 2018-11-07 DIAGNOSIS — E1122 Type 2 diabetes mellitus with diabetic chronic kidney disease: Secondary | ICD-10-CM | POA: Diagnosis not present

## 2018-11-07 DIAGNOSIS — I272 Pulmonary hypertension, unspecified: Secondary | ICD-10-CM | POA: Diagnosis not present

## 2018-11-07 DIAGNOSIS — I130 Hypertensive heart and chronic kidney disease with heart failure and stage 1 through stage 4 chronic kidney disease, or unspecified chronic kidney disease: Secondary | ICD-10-CM | POA: Diagnosis not present

## 2018-11-07 DIAGNOSIS — I5032 Chronic diastolic (congestive) heart failure: Secondary | ICD-10-CM | POA: Diagnosis not present

## 2018-11-07 DIAGNOSIS — J439 Emphysema, unspecified: Secondary | ICD-10-CM | POA: Diagnosis not present

## 2018-11-07 DIAGNOSIS — E039 Hypothyroidism, unspecified: Secondary | ICD-10-CM | POA: Diagnosis not present

## 2018-11-07 DIAGNOSIS — N184 Chronic kidney disease, stage 4 (severe): Secondary | ICD-10-CM | POA: Diagnosis not present

## 2018-11-07 DIAGNOSIS — I2510 Atherosclerotic heart disease of native coronary artery without angina pectoris: Secondary | ICD-10-CM | POA: Diagnosis not present

## 2018-11-19 DIAGNOSIS — N184 Chronic kidney disease, stage 4 (severe): Secondary | ICD-10-CM | POA: Diagnosis not present

## 2018-11-19 DIAGNOSIS — I130 Hypertensive heart and chronic kidney disease with heart failure and stage 1 through stage 4 chronic kidney disease, or unspecified chronic kidney disease: Secondary | ICD-10-CM | POA: Diagnosis not present

## 2018-11-19 DIAGNOSIS — I5032 Chronic diastolic (congestive) heart failure: Secondary | ICD-10-CM | POA: Diagnosis not present

## 2018-11-27 DIAGNOSIS — Z45018 Encounter for adjustment and management of other part of cardiac pacemaker: Secondary | ICD-10-CM | POA: Diagnosis not present

## 2018-11-27 DIAGNOSIS — Z66 Do not resuscitate: Secondary | ICD-10-CM | POA: Diagnosis not present

## 2018-11-27 DIAGNOSIS — I441 Atrioventricular block, second degree: Secondary | ICD-10-CM | POA: Diagnosis not present

## 2018-12-22 IMAGING — CT CT HEAD W/O CM
3 series · 15 of 47 positions shown, 18 images · non-contrast
Comparison: 02/15/2014

CLINICAL DATA: Mental status changes.

EXAM:
CT HEAD WITHOUT CONTRAST
TECHNIQUE: Contiguous axial images were obtained from the base of the skull
through the vertex without intravenous contrast.

[Series 2: head wo · axial · 0.47mm/px · z∈[+503,+628]mm · 9 of 30 slices shown, 12 images]
[im 3/30  brain]
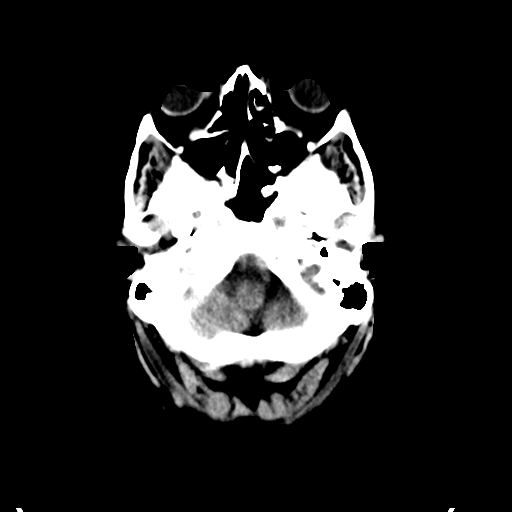
[im 3/30  bone]
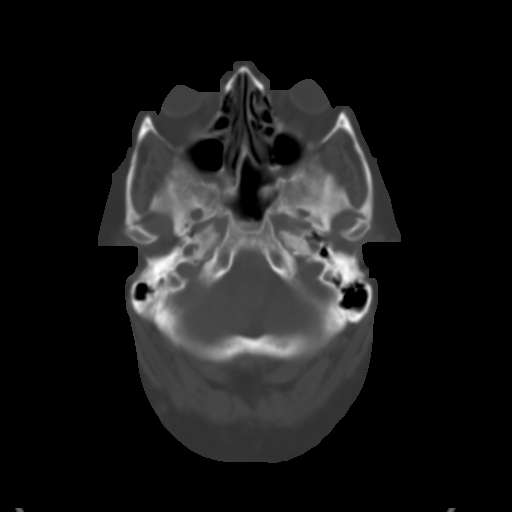
[im 6/30  brain]
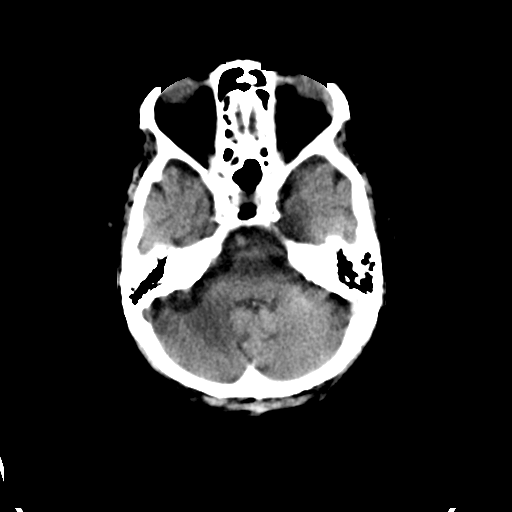
[im 9/30  brain]
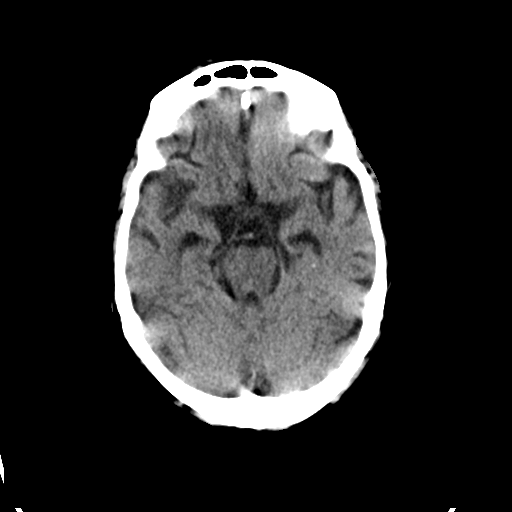
[im 12/30  brain]
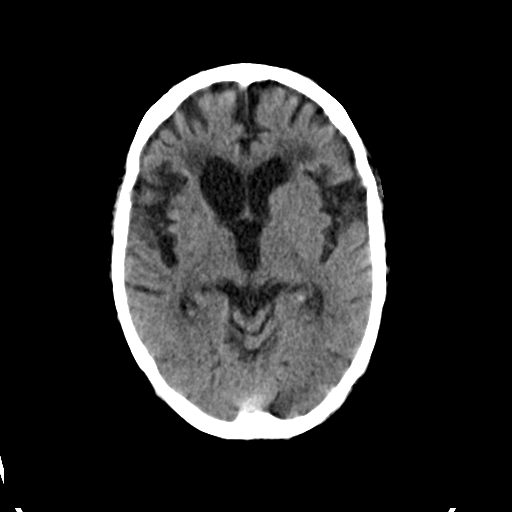
[im 16/30  brain]
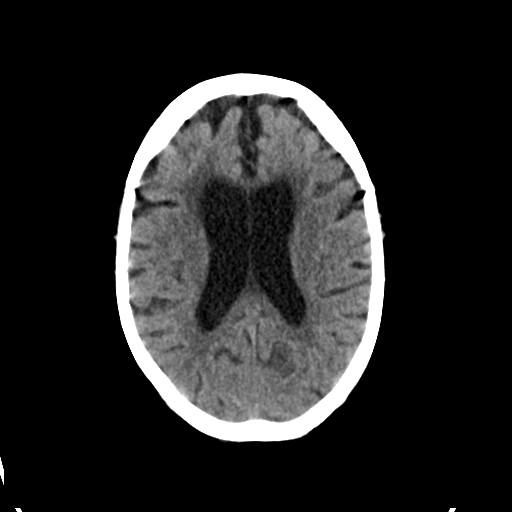
[im 16/30  bone]
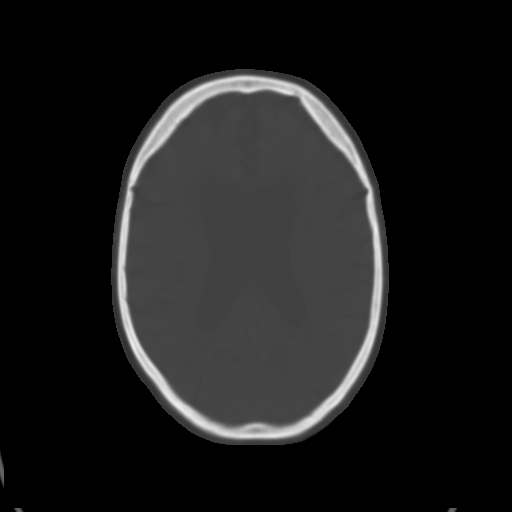
[im 19/30  brain]
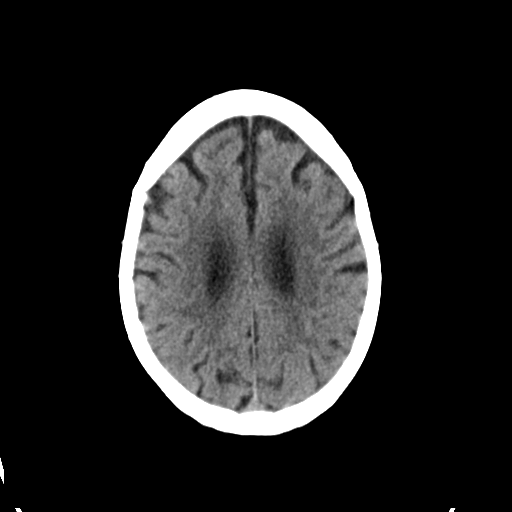
[im 22/30  brain]
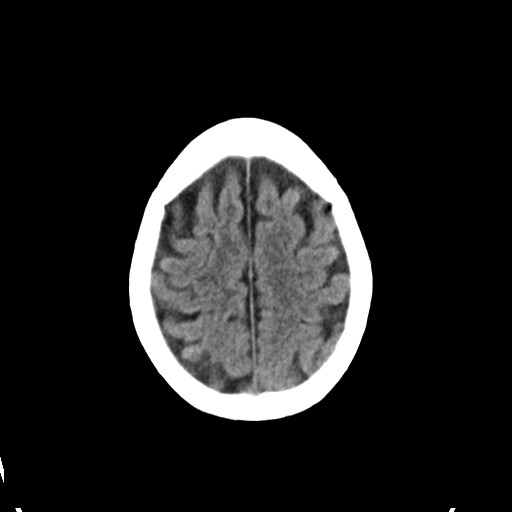
[im 25/30  brain]
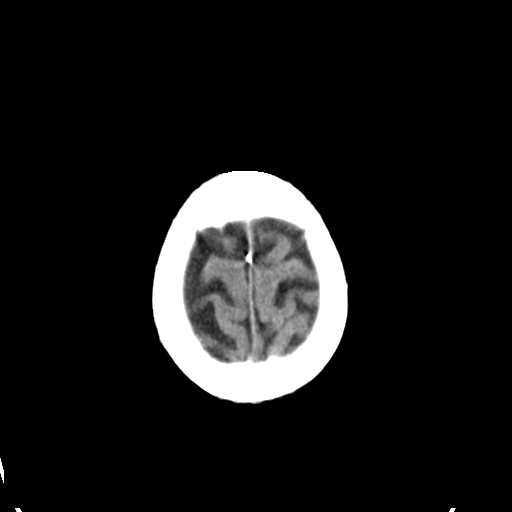
[im 28/30  brain]
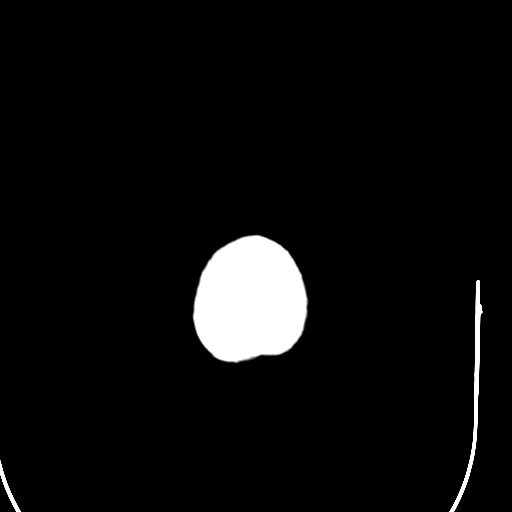
[im 28/30  bone]
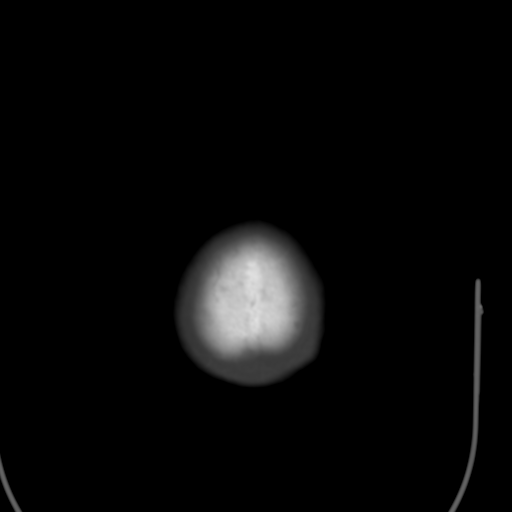

[Series 4: coronal soft tissue · coronal · 0.29mm/px · 3 of 67 slices shown]
[im 23/67  brain]
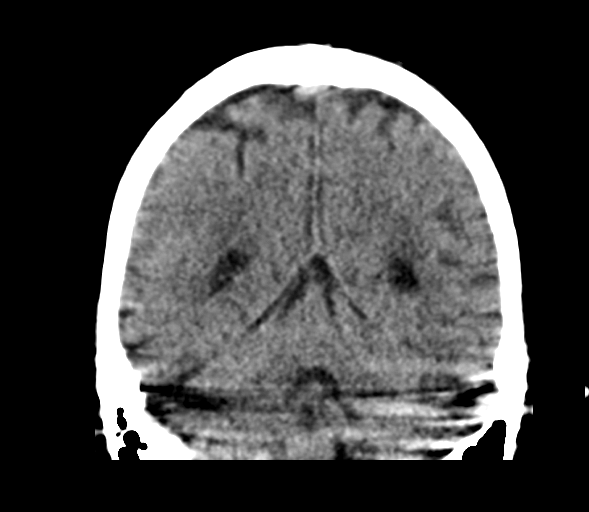
[im 30/67  brain]
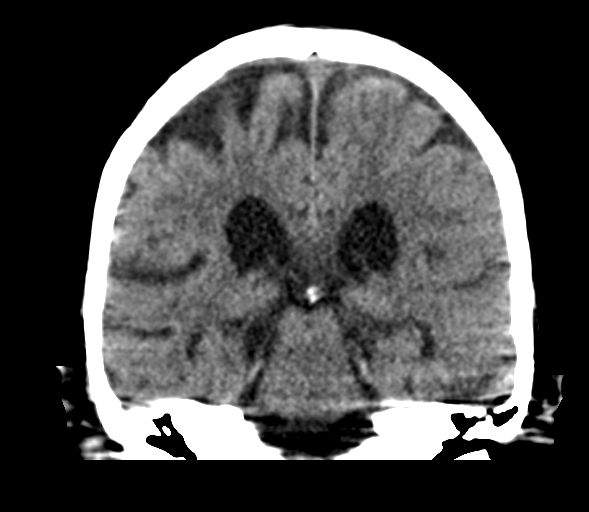
[im 37/67  brain]
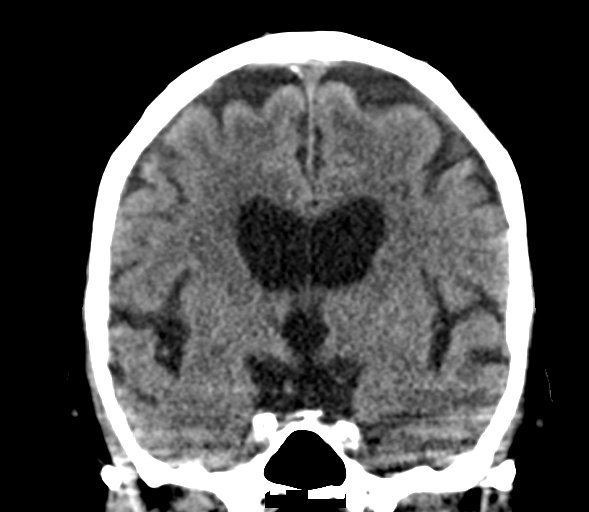

[Series 5: sagittal soft tissue · sagittal · 0.28mm/px · 3 of 47 slices shown]
[im 16/47  brain]
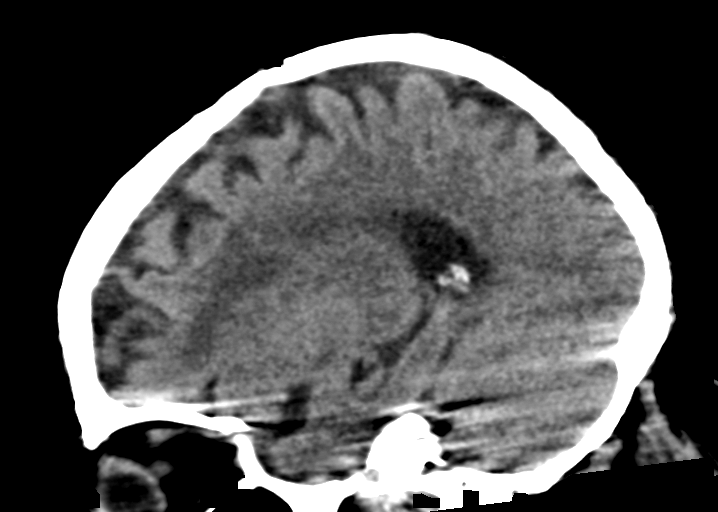
[im 24/47  brain]
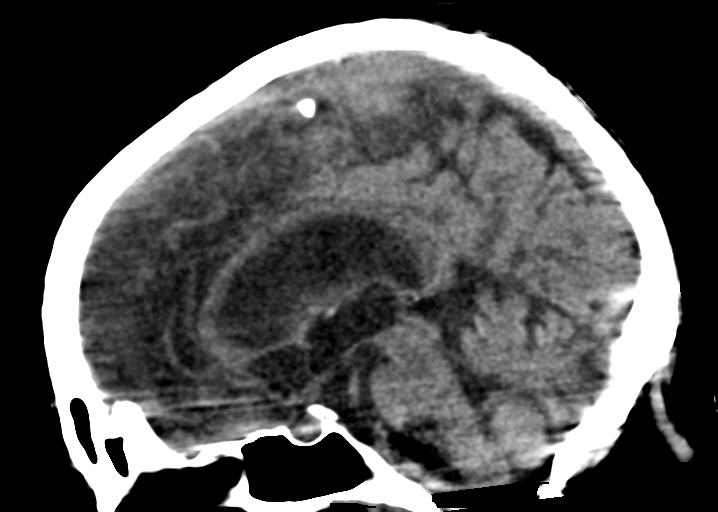
[im 31/47  brain]
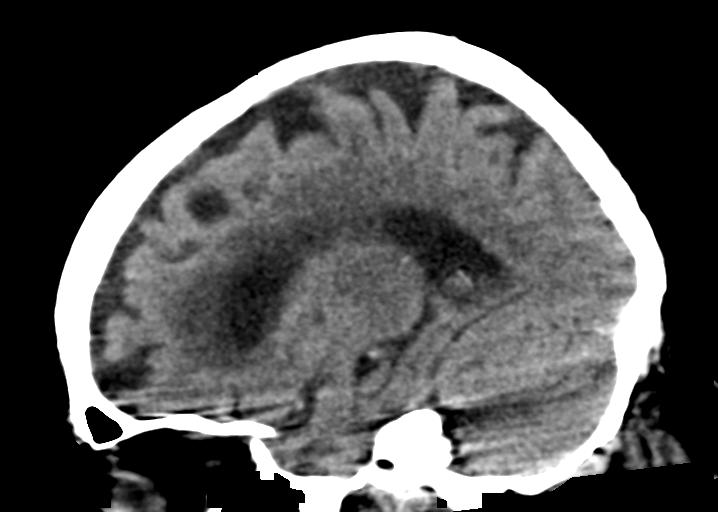

[15 of 47 positions shown; findings below may reference images not displayed]

FINDINGS: Brain: There is no evidence for acute hemorrhage, hydrocephalus,
mass lesion, or abnormal extra-axial fluid collection. No definite
CT evidence for acute infarction. Diffuse loss of parenchymal volume
is consistent with atrophy. Patchy low attenuation in the deep
hemispheric and periventricular white matter is nonspecific, but
likely reflects chronic microvascular ischemic demyelination.

Vascular: No hyperdense vessel or unexpected calcification.

Skull: No evidence for fracture. No worrisome lytic or sclerotic
lesion.

Sinuses/Orbits: The visualized paranasal sinuses and mastoid air
cells are clear. Visualized portions of the globes and intraorbital
fat are unremarkable.

Other: None.
IMPRESSION: 1. Stable exam.  No acute intracranial abnormality.
2. Atrophy with chronic small vessel white matter ischemic disease.

## 2018-12-22 IMAGING — DX DG CHEST 1V PORT
1 series · 1 of 1 positions shown · non-contrast
Comparison: 06/18/2016

CLINICAL DATA: Severe sepsis with shock

EXAM:
PORTABLE CHEST 1 VIEW

[chest ap]
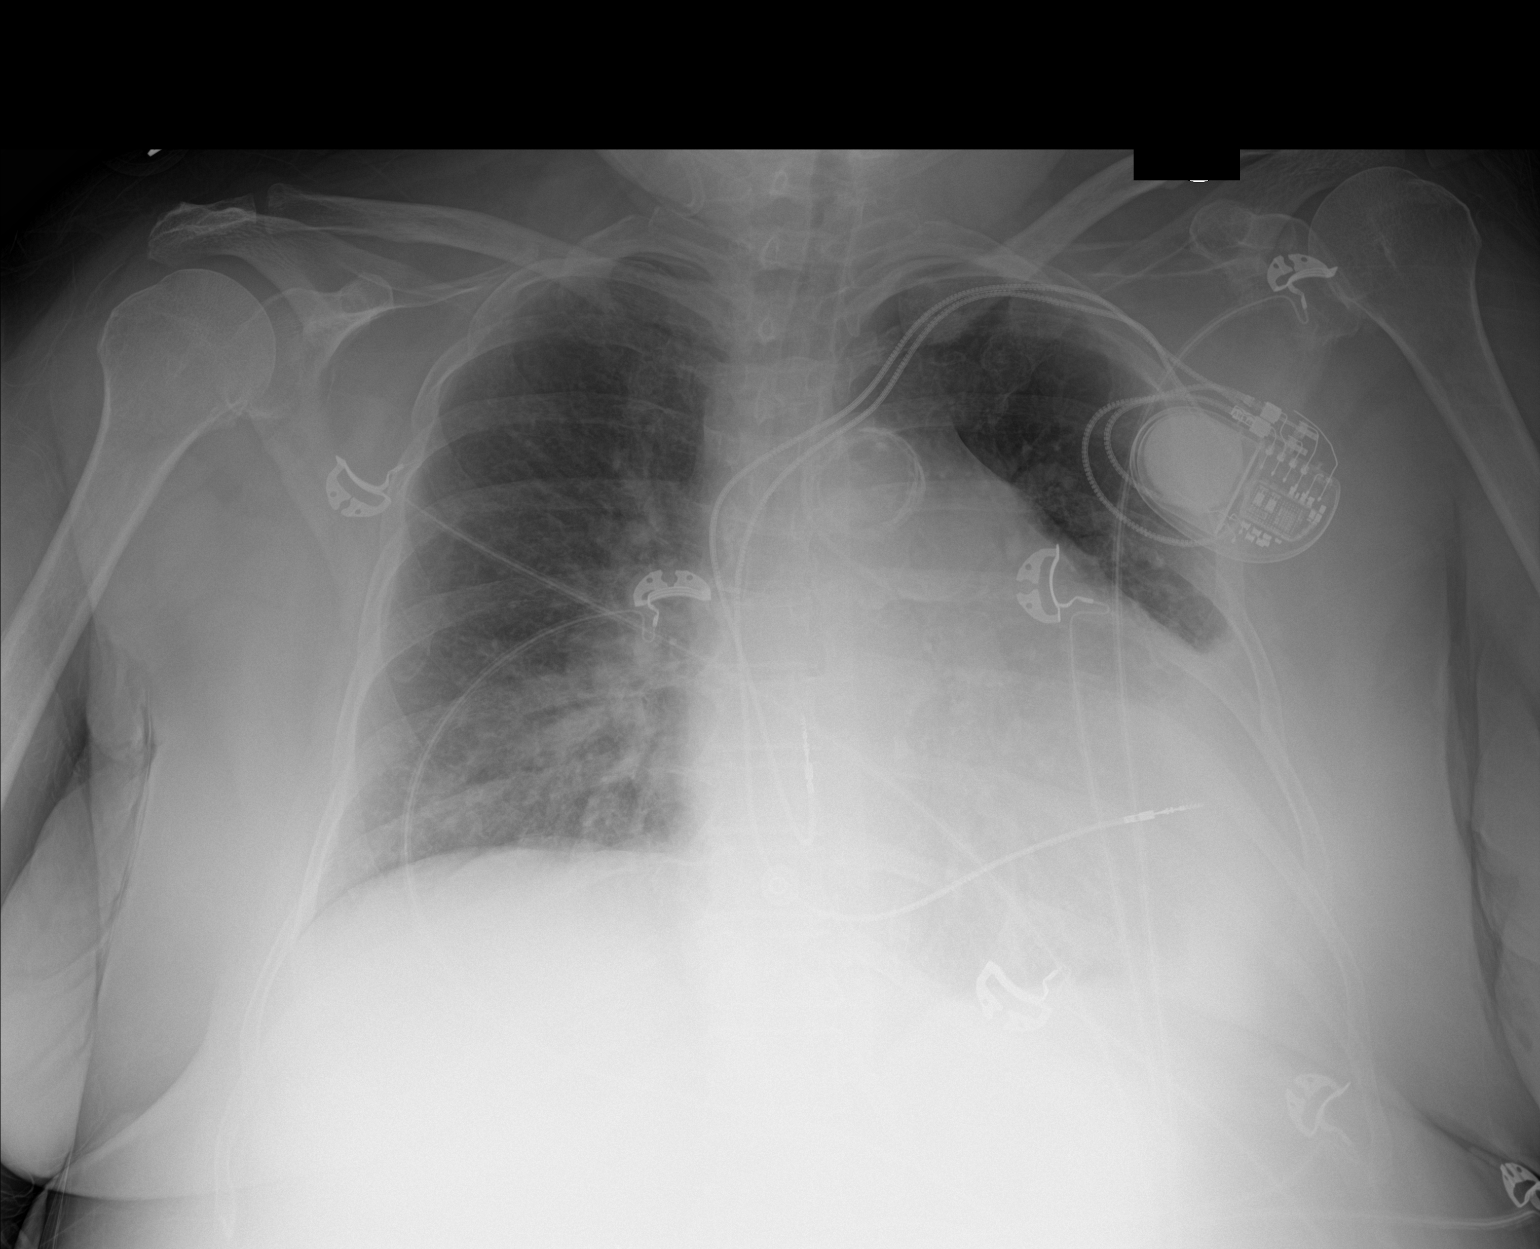

[1 of 1 positions shown; findings below may reference images not displayed]

FINDINGS: Chronic cardiopericardial enlargement. Dual-chamber pacer leads from
the left are new from prior, positioned over the right ventricle and
right atrium. Hazy peripheral density at the left cardiac apex which
could be partially artifactual. Prominent right infrahilar markings
but similar to prior.
IMPRESSION: 1. No definite pneumonia. There are prominent right infrahilar
markings but similar to prior.
2. Artifact versus pleural based opacity at the left base.
3. Suggest follow-up.

## 2018-12-24 IMAGING — CR DG CHEST 2V
2 series · 2 of 2 positions shown · non-contrast
Comparison: 01/05/2017 chest radiograph.

CLINICAL DATA: Pneumonia.

EXAM:
CHEST  2 VIEW

[chest lat]
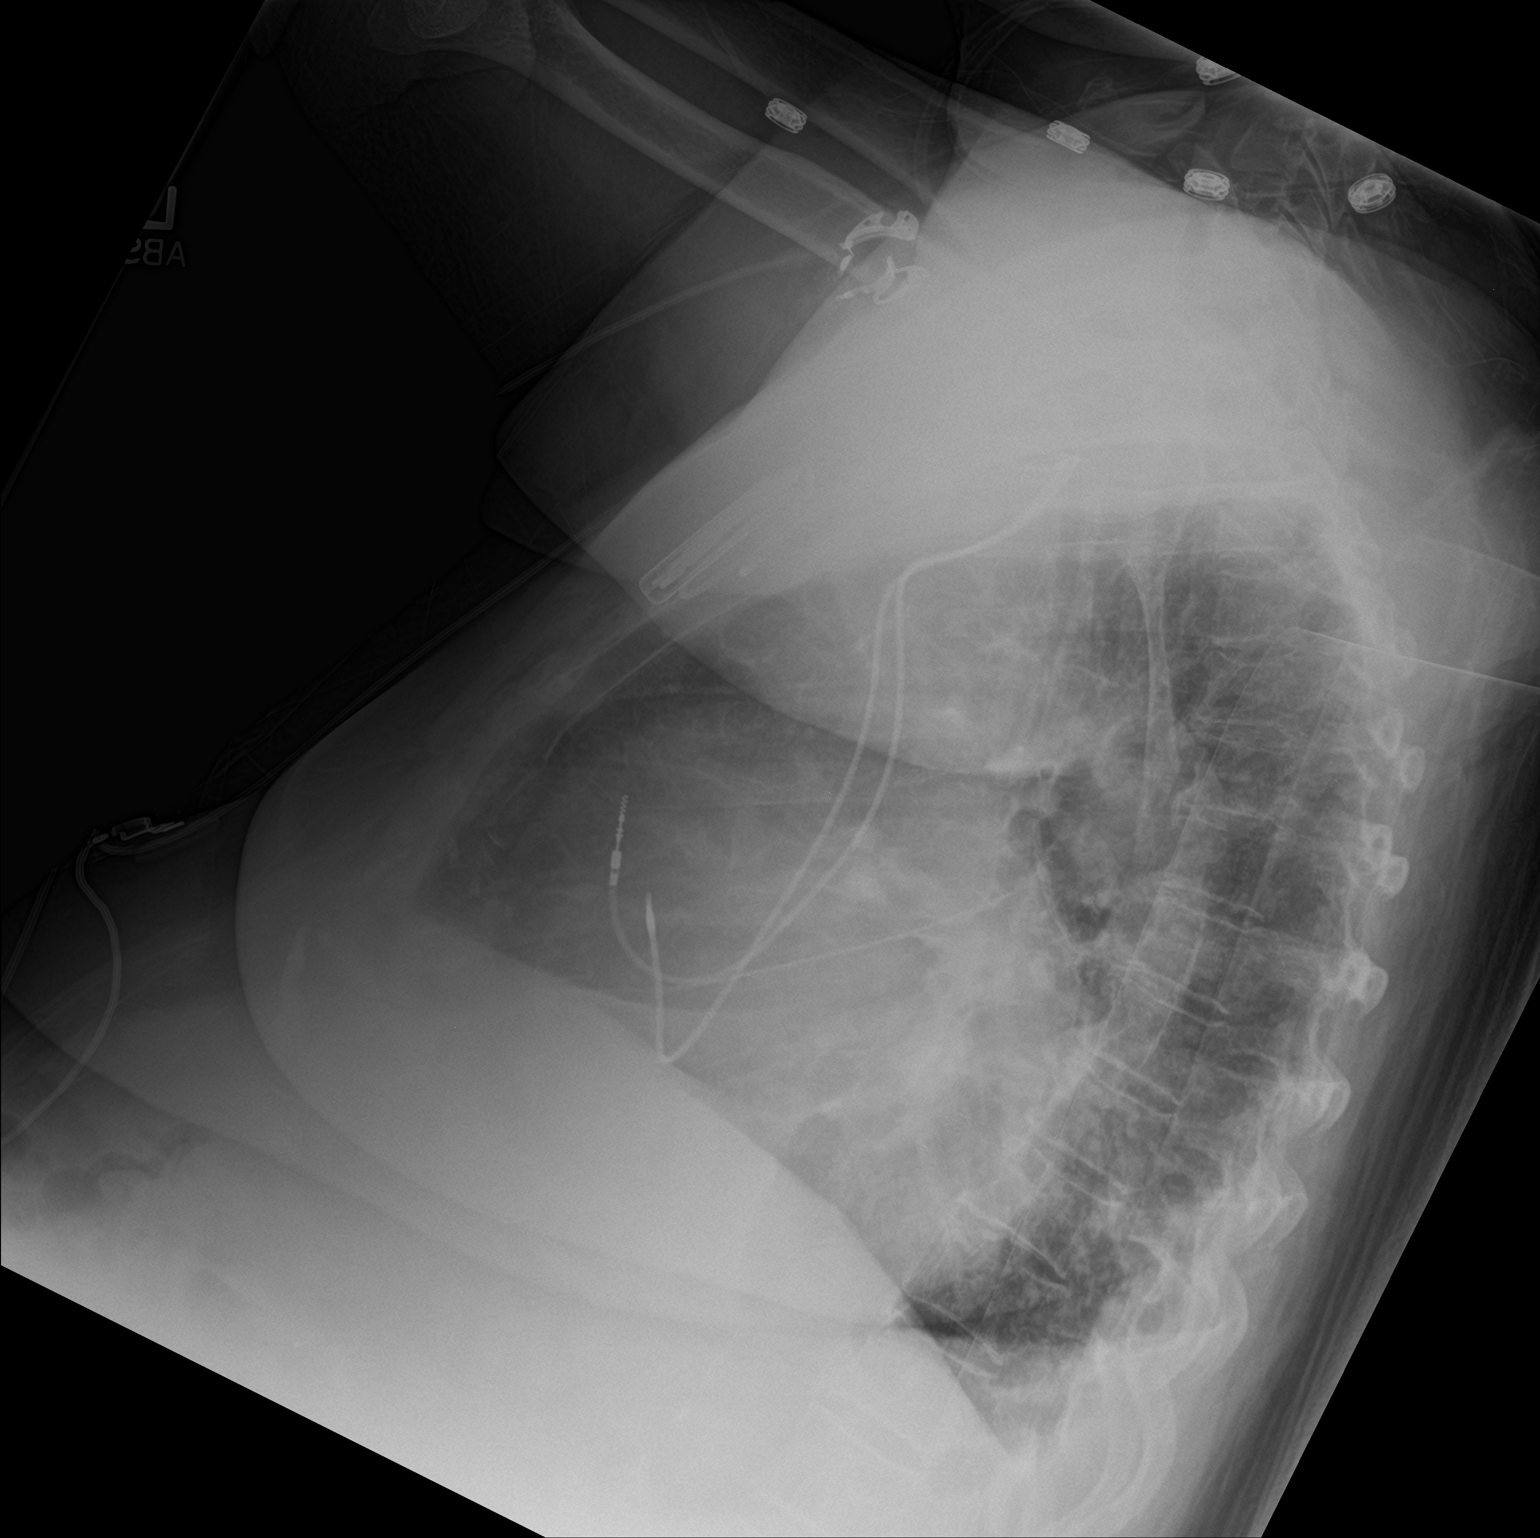

[chest ap]
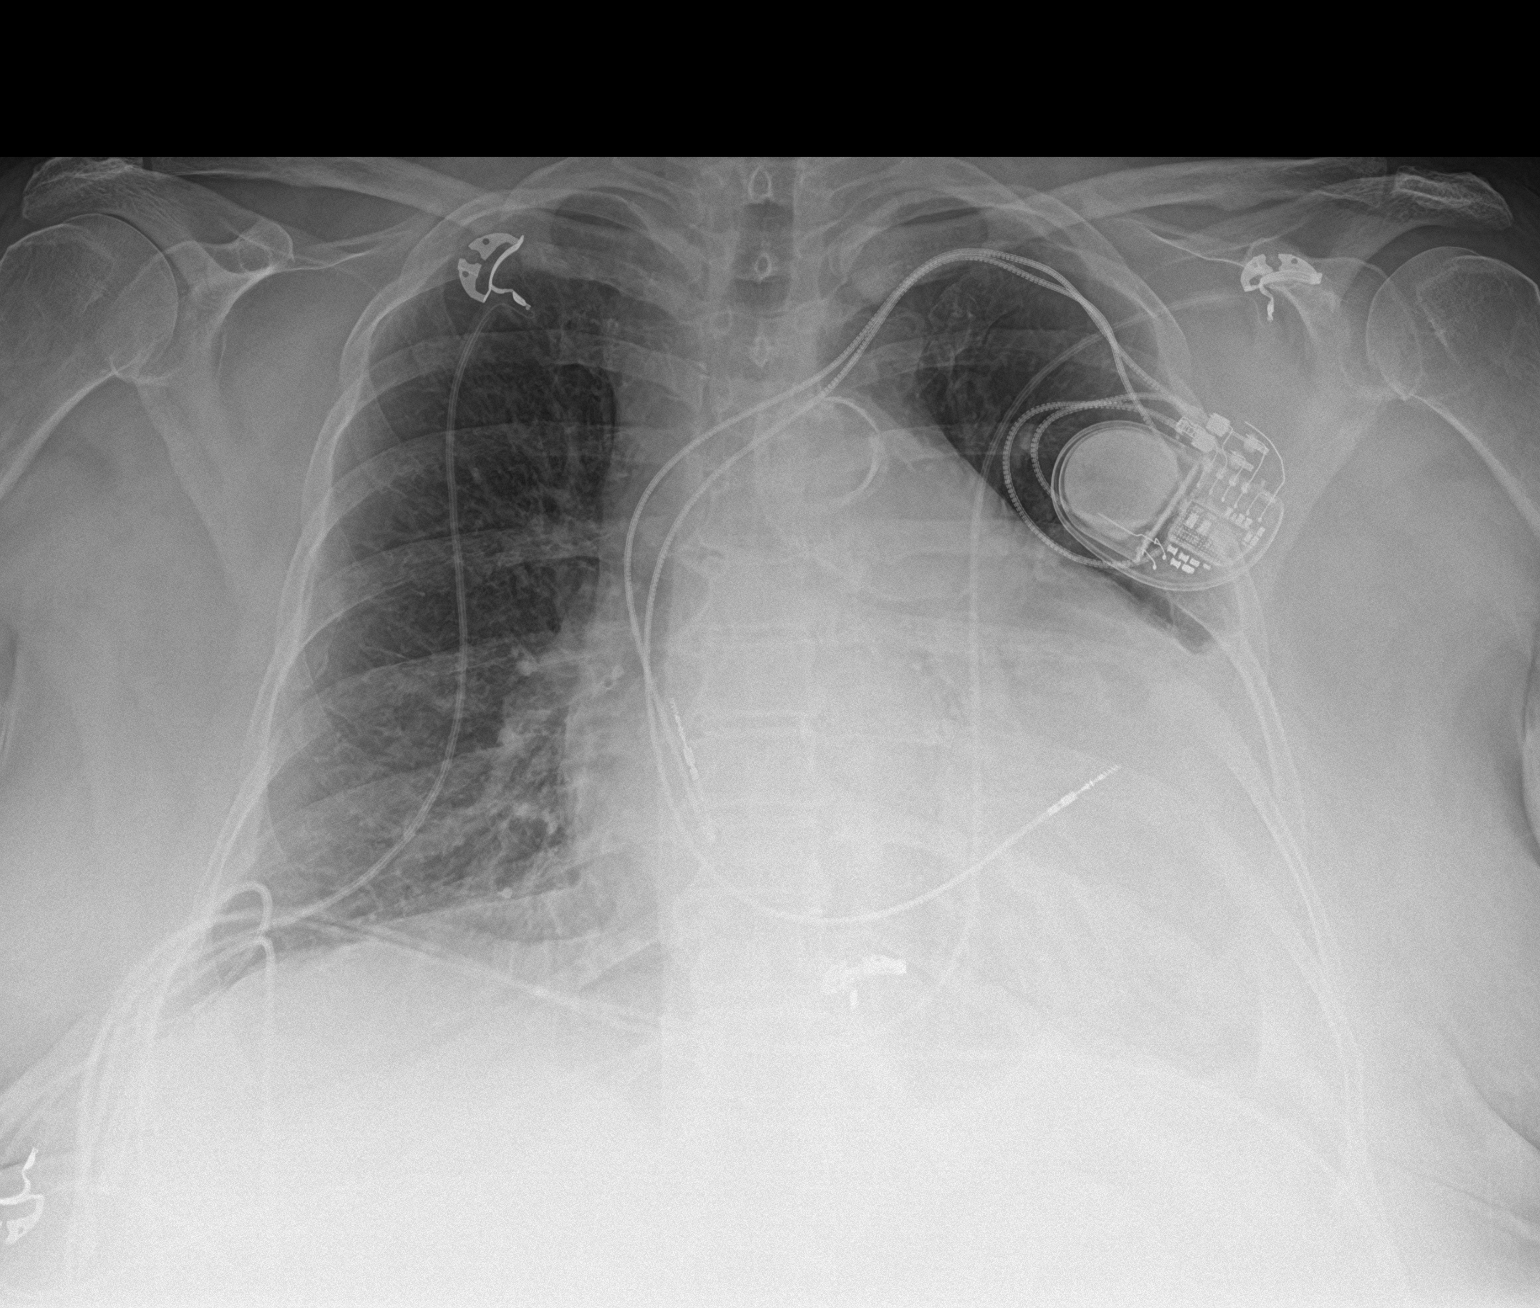

[2 of 2 positions shown; findings below may reference images not displayed]

FINDINGS: Stable configuration of 2 lead left subclavian pacemaker. Stable
cardiomediastinal silhouette with mild to moderate cardiomegaly and
aortic atherosclerosis. No pneumothorax. Small left pleural
effusion, slightly decreased. No right pleural effusion.
Cephalization of the pulmonary vasculature without overt pulmonary
edema. Mild left basilar opacity, slightly decreased.
IMPRESSION: 1. Stable cardiomegaly without overt pulmonary edema.
2. Small left pleural effusion, decreased.
3. Mild left basilar lung opacity, slightly decreased, favor
atelectasis.

## 2018-12-30 ENCOUNTER — Other Ambulatory Visit: Payer: Self-pay

## 2018-12-30 DIAGNOSIS — Z20822 Contact with and (suspected) exposure to covid-19: Secondary | ICD-10-CM

## 2018-12-31 LAB — NOVEL CORONAVIRUS, NAA: SARS-CoV-2, NAA: NOT DETECTED

## 2019-01-08 IMAGING — US US RENAL
1 series · 14 of 25 positions shown · non-contrast
Comparison: 10.8 cm

CLINICAL DATA: Acute renal insufficiency. Known left renal atrophy.

EXAM:
RENAL / URINARY TRACT ULTRASOUND COMPLETE

[Series 1: us renal · 0.28mm/px · 14 of 27 slices shown]
[im 1/27]
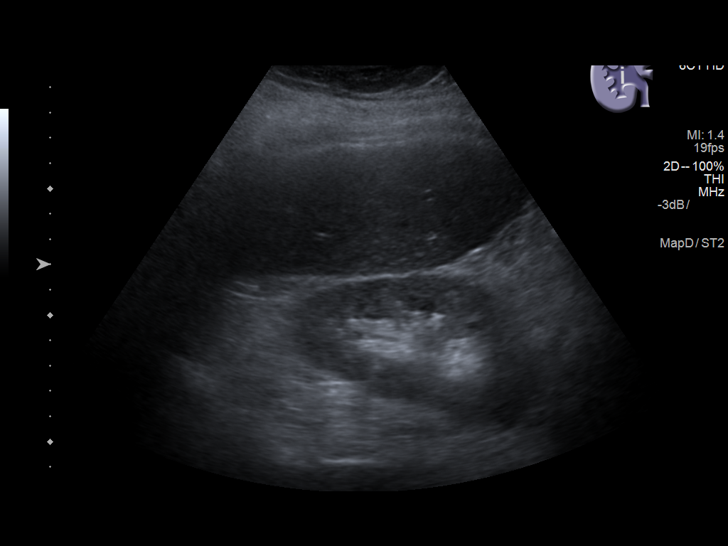
[im 3/27]
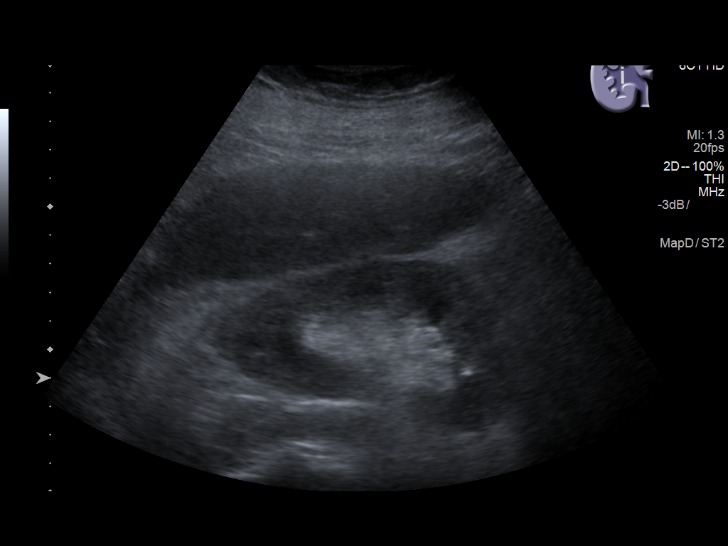
[im 5/27]
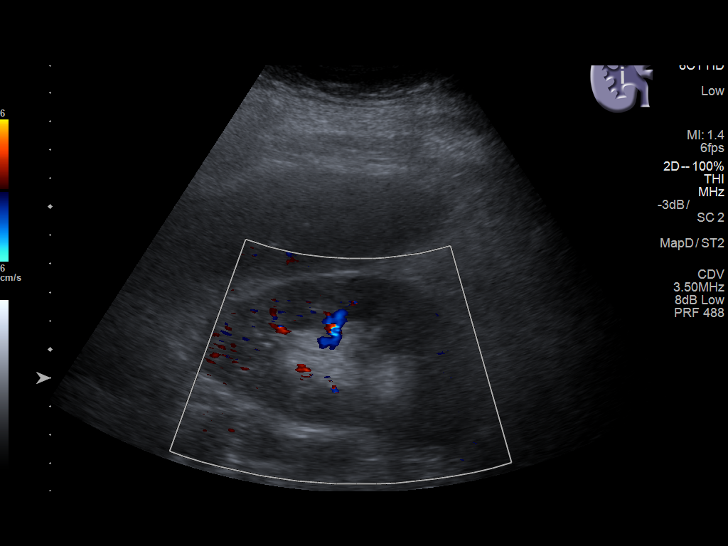
[im 7/27]
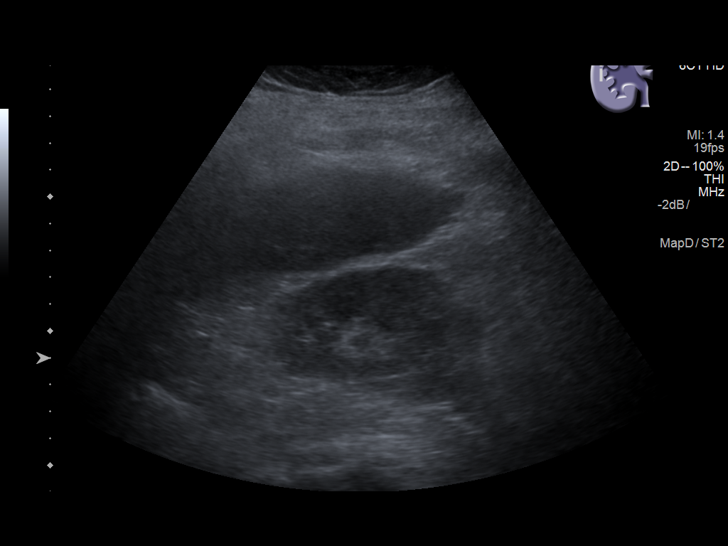
[im 9/27]
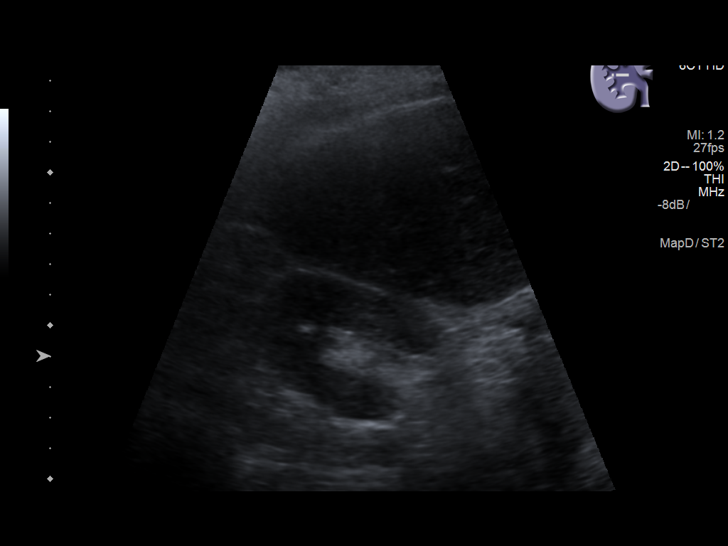
[im 10/27]
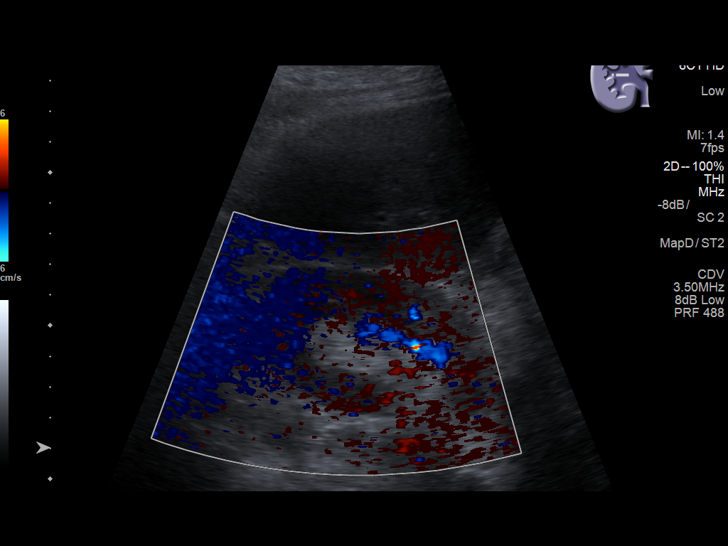
[im 12/27]
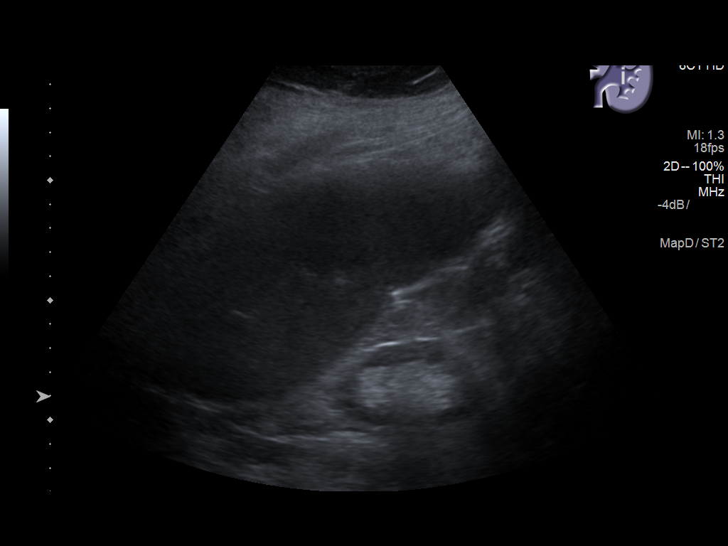
[im 15/27]
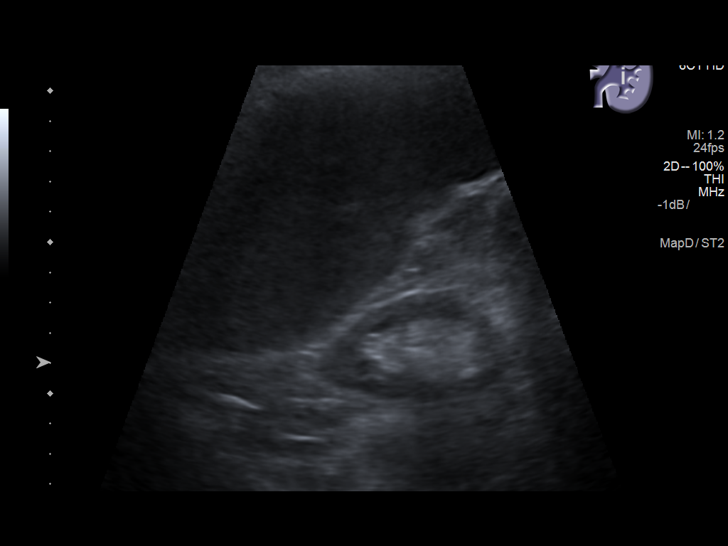
[im 17/27]
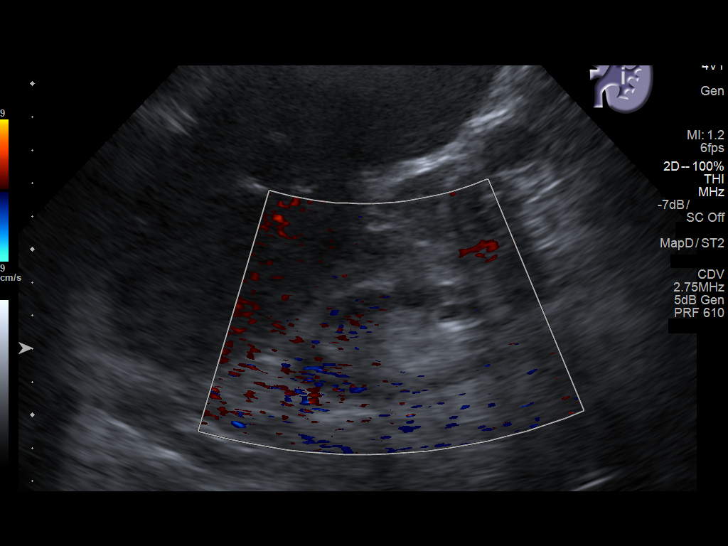
[im 18/27]
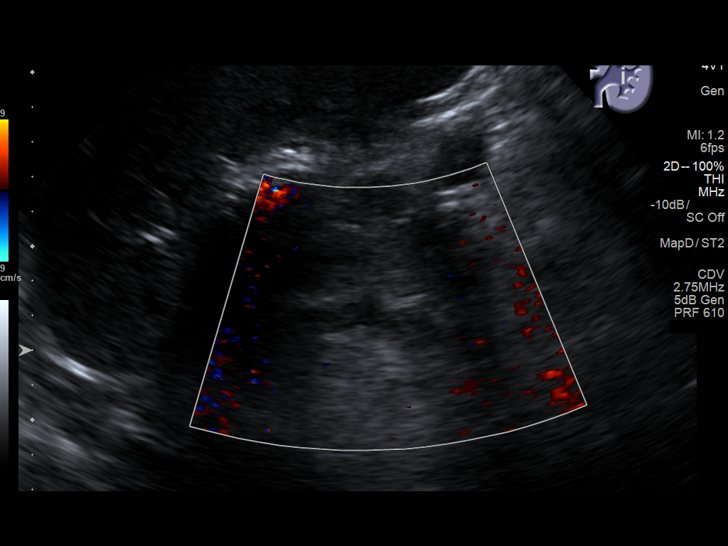
[im 20/27]
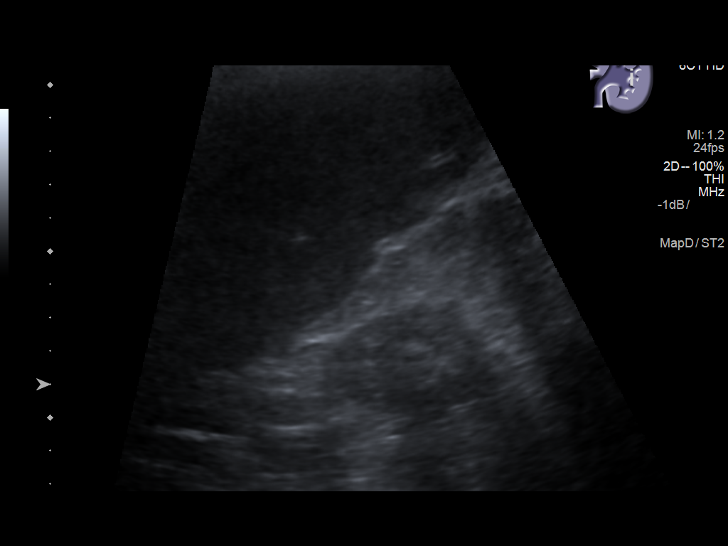
[im 22/27]
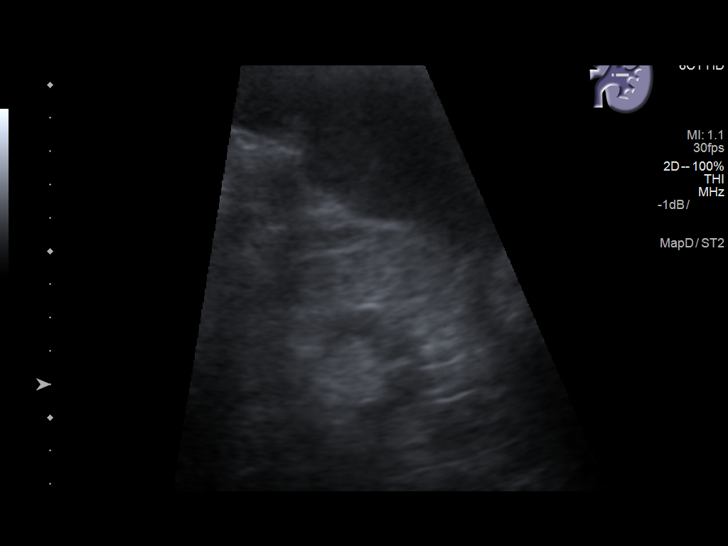
[im 24/27]
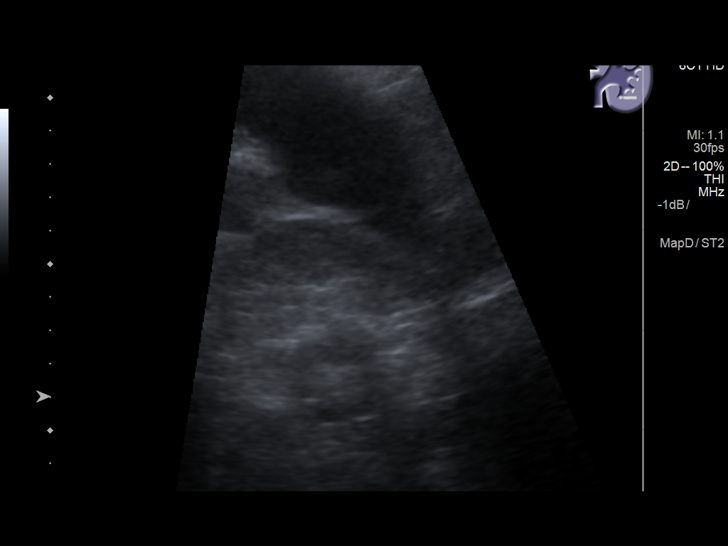
[im 27/27]
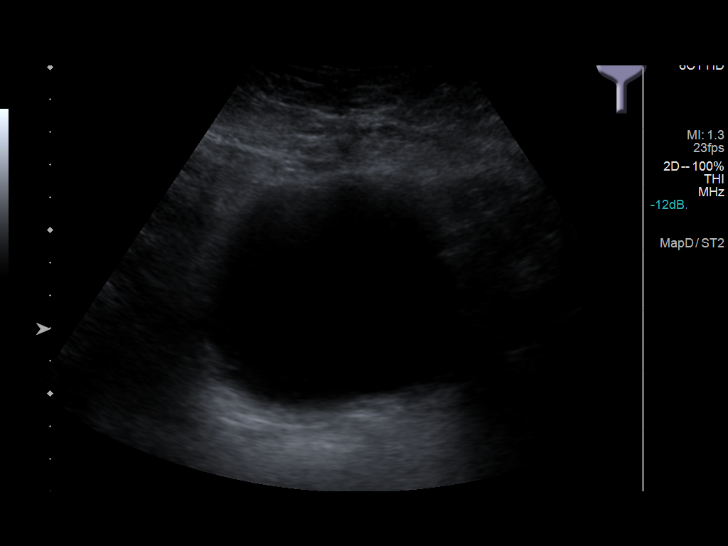

[14 of 25 positions shown; findings below may reference images not displayed]

FINDINGS: Right Kidney:

Length: 10.8 cm.. The renal cortical echotexture is greater than
that of the adjacent liver. There is no hydronephrosis nor focal
mass. No stones are evident.

Left Kidney:

Length: 6.8 cm. The cortical echoetexture is increased. There is
diffuse cortical thinning. There is no hydronephrosis nor cystic or
solid mass observed.

Bladder:

The partially distended urinary bladder is normal.
IMPRESSION: Chronic left renal atrophy. Increased renal cortical echotexture
bilaterally consistent with medical renal disease. There is no
hydronephrosis. The urinary bladder is unremarkable.

## 2019-02-26 DIAGNOSIS — Z45018 Encounter for adjustment and management of other part of cardiac pacemaker: Secondary | ICD-10-CM | POA: Diagnosis not present

## 2019-05-05 ENCOUNTER — Encounter: Payer: Self-pay | Admitting: *Deleted

## 2019-05-05 ENCOUNTER — Emergency Department
Admission: EM | Admit: 2019-05-05 | Discharge: 2019-05-05 | Disposition: A | Discharge status: Home / Self Care | Payer: Medicare HMO | Attending: Emergency Medicine | Admitting: Emergency Medicine

## 2019-05-05 ENCOUNTER — Other Ambulatory Visit: Payer: Self-pay

## 2019-05-05 DIAGNOSIS — T18128A Food in esophagus causing other injury, initial encounter: Secondary | ICD-10-CM

## 2019-05-05 DIAGNOSIS — I110 Hypertensive heart disease with heart failure: Secondary | ICD-10-CM | POA: Diagnosis not present

## 2019-05-05 DIAGNOSIS — Z79899 Other long term (current) drug therapy: Secondary | ICD-10-CM | POA: Insufficient documentation

## 2019-05-05 DIAGNOSIS — Y93G1 Activity, food preparation and clean up: Secondary | ICD-10-CM | POA: Insufficient documentation

## 2019-05-05 DIAGNOSIS — I509 Heart failure, unspecified: Secondary | ICD-10-CM | POA: Diagnosis not present

## 2019-05-05 DIAGNOSIS — Z7901 Long term (current) use of anticoagulants: Secondary | ICD-10-CM | POA: Insufficient documentation

## 2019-05-05 DIAGNOSIS — Z87891 Personal history of nicotine dependence: Secondary | ICD-10-CM | POA: Diagnosis not present

## 2019-05-05 DIAGNOSIS — E119 Type 2 diabetes mellitus without complications: Secondary | ICD-10-CM | POA: Insufficient documentation

## 2019-05-05 DIAGNOSIS — N390 Urinary tract infection, site not specified: Secondary | ICD-10-CM | POA: Diagnosis not present

## 2019-05-05 DIAGNOSIS — X58XXXA Exposure to other specified factors, initial encounter: Secondary | ICD-10-CM | POA: Insufficient documentation

## 2019-05-05 DIAGNOSIS — E039 Hypothyroidism, unspecified: Secondary | ICD-10-CM | POA: Insufficient documentation

## 2019-05-05 DIAGNOSIS — R300 Dysuria: Secondary | ICD-10-CM | POA: Diagnosis present

## 2019-05-05 DIAGNOSIS — Z950 Presence of cardiac pacemaker: Secondary | ICD-10-CM | POA: Insufficient documentation

## 2019-05-05 DIAGNOSIS — Y9289 Other specified places as the place of occurrence of the external cause: Secondary | ICD-10-CM | POA: Insufficient documentation

## 2019-05-05 DIAGNOSIS — R0789 Other chest pain: Secondary | ICD-10-CM | POA: Diagnosis not present

## 2019-05-05 DIAGNOSIS — Z7982 Long term (current) use of aspirin: Secondary | ICD-10-CM | POA: Insufficient documentation

## 2019-05-05 DIAGNOSIS — Z794 Long term (current) use of insulin: Secondary | ICD-10-CM | POA: Diagnosis not present

## 2019-05-05 DIAGNOSIS — Y998 Other external cause status: Secondary | ICD-10-CM | POA: Insufficient documentation

## 2019-05-05 LAB — BASIC METABOLIC PANEL
Anion gap: 9 (ref 5–15)
BUN: 47 mg/dL — ABNORMAL HIGH (ref 8–23)
CO2: 25 mmol/L (ref 22–32)
Calcium: 9.1 mg/dL (ref 8.9–10.3)
Chloride: 102 mmol/L (ref 98–111)
Creatinine, Ser: 2.51 mg/dL — ABNORMAL HIGH (ref 0.44–1.00)
GFR calc Af Amer: 21 mL/min — ABNORMAL LOW (ref >60)
GFR calc non Af Amer: 18 mL/min — ABNORMAL LOW (ref >60)
Glucose, Bld: 146 mg/dL — ABNORMAL HIGH (ref 70–99)
Potassium: 4.4 mmol/L (ref 3.5–5.1)
Sodium: 136 mmol/L (ref 135–145)

## 2019-05-05 LAB — CBC WITH DIFFERENTIAL/PLATELET
Abs Immature Granulocytes: 0.03 10*3/uL (ref 0.00–0.07)
Basophils Absolute: 0.1 10*3/uL (ref 0.0–0.1)
Basophils Relative: 2 %
Eosinophils Absolute: 0.5 10*3/uL (ref 0.0–0.5)
Eosinophils Relative: 6 %
HCT: 35.7 % — ABNORMAL LOW (ref 36.0–46.0)
Hemoglobin: 12.1 g/dL (ref 12.0–15.0)
Immature Granulocytes: 0 %
Lymphocytes Relative: 26 %
Lymphs Abs: 2.1 10*3/uL (ref 0.7–4.0)
MCH: 28.5 pg (ref 26.0–34.0)
MCHC: 33.9 g/dL (ref 30.0–36.0)
MCV: 84 fL (ref 80.0–100.0)
Monocytes Absolute: 0.5 10*3/uL (ref 0.1–1.0)
Monocytes Relative: 6 %
Neutro Abs: 5 10*3/uL (ref 1.7–7.7)
Neutrophils Relative %: 60 %
Platelets: 154 10*3/uL (ref 150–400)
RBC: 4.25 MIL/uL (ref 3.87–5.11)
RDW: 14.3 % (ref 11.5–15.5)
WBC: 8.2 10*3/uL (ref 4.0–10.5)
nRBC: 0 % (ref 0.0–0.2)

## 2019-05-05 LAB — URINALYSIS, COMPLETE (UACMP) WITH MICROSCOPIC
Bilirubin Urine: NEGATIVE
Glucose, UA: NEGATIVE mg/dL
Hgb urine dipstick: NEGATIVE
Ketones, ur: NEGATIVE mg/dL
Nitrite: NEGATIVE
Protein, ur: 100 mg/dL — AB
Specific Gravity, Urine: 1.013 (ref 1.005–1.030)
WBC, UA: >50 WBC/hpf (ref 0–5)
pH: 5 (ref 5.0–8.0)

## 2019-05-05 MED ORDER — ALUM & MAG HYDROXIDE-SIMETH 200-200-20 MG/5ML PO SUSP
30.0000 mL | Freq: Once | ORAL | Status: AC
  Administered 2019-05-05: 16:00:00 30 mL via ORAL
  Filled 2019-05-05: qty 30

## 2019-05-05 MED ORDER — LIDOCAINE VISCOUS HCL 2 % MT SOLN
15.0000 mL | Freq: Once | OROMUCOSAL | Status: AC
  Administered 2019-05-05: 16:00:00 15 mL via ORAL
  Filled 2019-05-05: qty 15

## 2019-05-05 MED ORDER — SODIUM CHLORIDE 0.9 % IV BOLUS
500.0000 mL | Freq: Once | INTRAVENOUS | Status: AC
  Administered 2019-05-05: 500 mL via INTRAVENOUS

## 2019-05-05 MED ORDER — CEPHALEXIN 500 MG PO CAPS: 500.0000 mg | ORAL_CAPSULE | Freq: Four times a day (QID) | ORAL | 0 refills | Status: AC

## 2019-05-05 MED ORDER — SODIUM CHLORIDE 0.9 % IV SOLN
1.0000 g | Freq: Once | INTRAVENOUS | Status: AC
  Administered 2019-05-05: 19:00:00 1 g via INTRAVENOUS
  Filled 2019-05-05: qty 10

## 2019-05-05 NOTE — ED Notes (Signed)
Per MD Archie Balboa give pt ginger ale. Pt states she is able to swallow without any pain or difficulty.

## 2019-05-05 NOTE — ED Triage Notes (Signed)
Pt to triage via wheelchair.  Pt has alz dz.  Family with pt.  Pt spitting up in triage after eating 1 hour ago.  Ate sausage biscuits and feels like it is stuck at top of stomach per pt.  Pt has burning with urination.  Sx for several weeks per family.  Pt alert.

## 2019-05-05 NOTE — ED Notes (Signed)
MD Goodman at bedside. 

## 2019-05-05 NOTE — ED Provider Notes (Signed)
[facility] Emergency Department Provider Note   ____________________________________________   I have reviewed the triage vital signs and the nursing notes.   HISTORY  Chief Complaint Dysuria and Foreign Body   History limited by and level 5 caveat due to: Dementia, history primarily obtained from daughter  HPI Andrea [name] is a 73 y.o. female who presents to the emergency department today with complaints of dysuria as well as concern for esophageal foreign body. The patient apparently has been having dysuria for weeks. Additionally family states they have noticed bad odor to the patient's urine. Family has tried some yeast medication at home without any significant relief. In terms of the esophageal foreign body the patient ate a biscuit prior to coming to the emergency department. Felt that it got stuck in her throat. Did cause discomfort in her chest. Apparently has had esophageal dilation done in the past but is not interested in having it again because it did not last a long time.    Records reviewed. Per medical record review patient has a history of dementia, GERD.   Past Medical History:  Diagnosis Date  . CHF (congestive heart failure) ([place])   . Chronic back pain    Followed by pain clinic in [place]  . Endometriosis   . HTN (hypertension)   . Hypothyroidism   . IDDM (insulin dependent diabetes mellitus)   . Mild dementia ([place])   . NASH (nonalcoholic steatohepatitis)    has had liver biopsy in past  . Obesity   . PUD (peptic ulcer disease) 1990's    Patient Active Problem List   Diagnosis Date Noted  . Abdominal pain 09/27/2018  . Drug overdose 03/08/2017  . Altered mental status   . Encephalopathy acute 01/05/2017  . CHF (congestive heart failure) ([place]) 06/18/2016  . GERD (gastroesophageal reflux disease) 03/07/2013  . Other and unspecified hyperlipidemia 03/07/2013  . Dizziness and giddiness 11/21/2012  . Urinary incontinence  09/11/2012  . Chronic low back pain 07/30/2012  . Anemia 07/30/2012  . Noncompliance 07/30/2012  . Neuropathy 01/12/2012  . Carotid stenosis, right 11/29/2011  . Diabetes mellitus type 2, uncontrolled ([place]) 11/29/2011  . Hypertension 11/29/2011  . Renal artery stenosis ([place]) 06/04/2011  . Constipation, chronic 06/04/2011  . Hypothyroidism 04/11/2011    Past Surgical History:  Procedure Laterality Date  . CHOLECYSTECTOMY    . ESOPHAGOGASTRODUODENOSCOPY N/A 10/02/2018   Procedure: ESOPHAGOGASTRODUODENOSCOPY (EGD);  Surgeon: Toledo, Benay Pike, MD;  Location: [facility] ENDOSCOPY;  Service: Gastroenterology;  Laterality: N/A;  . INSERT / REPLACE / REMOVE PACEMAKER    . TOTAL ABDOMINAL HYSTERECTOMY W/ BILATERAL SALPINGOOPHORECTOMY      Prior to Admission medications   Medication Sig Start Date End Date Taking? Authorizing Provider  acetaminophen (TYLENOL) 325 MG tablet Take 650 mg by mouth 3 (three) times daily as needed for mild pain, fever or headache.     [provider]  albuterol (PROVENTIL) (2.5 MG/3ML) 0.083% nebulizer solution Take 2.5 mg by nebulization every 6 (six) hours as needed for wheezing or shortness of breath.    [provider]  amLODipine (NORVASC) 10 MG tablet TAKE ONE TABLET BY MOUTH ONCE DAILY *NEEDS APPOINTMENT FOR FURTHER REFILLS* Patient taking differently: Take 10 mg by mouth daily.  02/23/14   Jackolyn Confer, MD  aspirin 81 MG tablet Take 81 mg by mouth daily.    [provider]  atorvastatin (LIPITOR) 20 MG tablet Take 20 mg by mouth every evening.     [provider]  carvedilol (COREG) 25 MG tablet Take 1 tablet by mouth 2 (two) times daily with a meal.  11/30/16   [provider]  cholecalciferol (VITAMIN D) 1000 units tablet Take 1,000 Units by mouth daily.    [provider]  cloNIDine (CATAPRES) 0.3 MG tablet Take 0.3 mg by mouth 2 (two) times daily.     [provider]  ELIQUIS 5 MG TABS tablet  Take 5 mg by mouth 2 (two) times daily. 09/12/18   [provider]  gabapentin (NEURONTIN) 100 MG capsule Take 100 mg by mouth 3 (three) times daily.     [provider]  insulin aspart (NOVOLOG) 100 UNIT/ML injection Inject 3-15 Units into the skin 3 (three) times daily before meals. Sliding scale Patient not taking: Reported on 10/02/2018 10/29/12   Jackolyn Confer, MD  Insulin Glargine (TOUJEO SOLOSTAR) 300 UNIT/ML SOPN Inject 30 Units into the skin daily. Patient taking differently: Inject 60 Units into the skin daily.  01/11/17   Hillary Bow, MD  levothyroxine (SYNTHROID, LEVOTHROID) 175 MCG tablet Take 1 tablet by mouth daily. Given one hour prior to any other medications 12/18/16   [provider]  losartan (COZAAR) 50 MG tablet Take 1 tablet by mouth daily. 02/26/17   [provider]  metoCLOPramide (REGLAN) 5 MG tablet Take 1 tablet (5 mg total) by mouth 4 (four) times daily -  before meals and at bedtime. 10/03/18 10/03/19  Dustin Flock, MD  pantoprazole (PROTONIX) 40 MG tablet Take 1 tablet (40 mg total) by mouth 2 (two) times daily. 10/03/18   Dustin Flock, MD  PARoxetine (PAXIL) 40 MG tablet Take 40 mg by mouth daily.     [provider]  traZODone (DESYREL) 50 MG tablet Take 50 mg by mouth at bedtime as needed for sleep.    [provider]  vitamin B-12 (CYANOCOBALAMIN) 1000 MCG tablet Take 5,000 mcg by mouth daily.     [provider]    Allergies Watermelon concentrate, Ace inhibitors, Celebrex [celecoxib], Penicillins, and Sulfa antibiotics  Family History  Problem Relation Age of Onset  . COPD Sister   . Cancer Brother   . Emphysema Mother   . Heart disease Father     Social History Social History   Tobacco Use  . Smoking status: Former Research scientist (life sciences)  . Smokeless tobacco: Never Used  . Tobacco comment: Quit 1985  Substance Use Topics  . Alcohol use: No  . Drug use: No    Review of  Systems Constitutional: No fever/chills Eyes: No visual changes. ENT: No sore throat. Cardiovascular: Positive for chest pain. Respiratory: Denies shortness of breath. Gastrointestinal: No abdominal pain.  Positive for sensation of food in throat.  Genitourinary: Negative for dysuria. Musculoskeletal: Negative for back pain. Skin: Negative for rash. Neurological: Negative for headaches, focal weakness or numbness.  ____________________________________________   PHYSICAL EXAM:  VITAL SIGNS: ED Triage Vitals  Enc Vitals Group     BP 05/05/19 1608 133/63     Pulse Rate 05/05/19 1607 61     Resp 05/05/19 1607 18     Temp 05/05/19 1607 97.6 F (36.4 C)     Temp Source 05/05/19 1607 Oral     SpO2 05/05/19 1607 95 %     Weight 05/05/19 1609 190 lb (86.2 kg)     Height 05/05/19 1609 5\' 1"  (1.549 m)     Head Circumference --      Peak Flow --  Pain Score 05/05/19 1609 6   Constitutional: Alert and oriented.  Eyes: Conjunctivae are normal.  ENT      Head: Normocephalic and atraumatic.      Nose: No congestion/rhinnorhea.      Mouth/Throat: Mucous membranes are moist.      Neck: No stridor. Hematological/Lymphatic/Immunilogical: No cervical lymphadenopathy. Cardiovascular: Normal rate, regular rhythm.  No murmurs, rubs, or gallops.  Respiratory: Normal respiratory effort without tachypnea nor retractions. Breath sounds are clear and equal bilaterally. No wheezes/rales/rhonchi. Gastrointestinal: Soft and non tender. No rebound. No guarding.  Genitourinary: Deferred Musculoskeletal: Normal range of motion in all extremities. No lower extremity edema. Neurologic:  Dementia. Moving all exxtremities.  Skin:  Skin is warm, dry and intact. No rash noted. Psychiatric: Mood and affect are normal. Speech and behavior are normal. Patient exhibits appropriate insight and judgment.  ____________________________________________    LABS (pertinent positives/negatives)  BMP wnl except  glu 146, BUN 47, Cr 2.51 CBC wbc 8.2, hgb 12.1, plt 154 UA turbid, large leukocytes, ?50 WBC, many bacteria, WBC clumps present   ____________________________________________   EKG  None  ____________________________________________    RADIOLOGY  None  ____________________________________________   PROCEDURES  Procedures  ____________________________________________   INITIAL IMPRESSION / ASSESSMENT AND PLAN / ED COURSE  Pertinent labs & imaging results that were available during my care of the patient were reviewed by me and considered in my medical decision making (see chart for details).   Patient presented to the emergency department today with complaints of food impaction as well as likely urinary tract infection.  By the time my exam the patient stated that her chest felt better.  She was given some ginger ale and was able to swallow without difficulty.  Apparently she has a history of esophageal dilatation in the past but is not interested in getting that performed again.  In terms of the urinary complaints urine is consistent with urinary tract infection.  Patient was given dose of Rocephin here in the emergency department will be sent home with further antibiotics.  Her creatinine was slightly elevated over her baseline.  She was given IV fluids.  Discussed with patient and family both return precautions importance of primary care follow-up. ____________________________________________   FINAL CLINICAL IMPRESSION(S) / ED DIAGNOSES  Final diagnoses:  Lower urinary tract infectious disease  Food impaction of esophagus, initial encounter     Note: This dictation was prepared with Dragon dictation. Any transcriptional errors that result from this process are unintentional     Nance Pear, MD 05/05/19 2034

## 2019-05-05 NOTE — Discharge Instructions (Addendum)
Please seek medical attention for any high fevers, chest pain, shortness of breath, change in behavior, persistent vomiting, bloody stool or any other new or concerning symptoms.  

## 2019-05-05 NOTE — ED Provider Notes (Signed)
[facility] Emergency Department Provider Note  ____________________________________________  Time seen: Approximately 4:10 PM  The following is a medical screening exam note. It is intended that the patient await an ER room assignment for detailed exam, diagnosis, and disposition.  I have reviewed the triage vital signs.    HISTORY  Chief Complaint Dysuria and Foreign Body   HPI Andrea [name] is a 73 y.o. female presents to the emergency department for treatment of dysuria. She is also having issues with swallowing, which is chronic but has seemed worse since she ate a sausage biscuit this morning. She has had it "stretched" before but states it "doesn't last" so she doesn't want to have it done again.   Past Medical History:  Diagnosis Date  . CHF (congestive heart failure) ([place])   . Chronic back pain    Followed by pain clinic in [place]  . Endometriosis   . HTN (hypertension)   . Hypothyroidism   . IDDM (insulin dependent diabetes mellitus) ([place])   . Mild dementia ([place])   . NASH (nonalcoholic steatohepatitis)    has had liver biopsy in past  . Obesity   . PUD (peptic ulcer disease) 1990's    Patient Active Problem List   Diagnosis Date Noted  . Abdominal pain 09/27/2018  . Drug overdose 03/08/2017  . Altered mental status   . Encephalopathy acute 01/05/2017  . CHF (congestive heart failure) ([place]) 06/18/2016  . GERD (gastroesophageal reflux disease) 03/07/2013  . Other and unspecified hyperlipidemia 03/07/2013  . Dizziness and giddiness 11/21/2012  . Urinary incontinence 09/11/2012  . Chronic low back pain 07/30/2012  . Anemia 07/30/2012  . Noncompliance 07/30/2012  . Neuropathy 01/12/2012  . Carotid stenosis, right 11/29/2011  . Diabetes mellitus type 2, uncontrolled ([place]) 11/29/2011  . Hypertension 11/29/2011  . Renal artery stenosis ([place]) 06/04/2011  . Constipation, chronic 06/04/2011  . Hypothyroidism 04/11/2011    Past  Surgical History:  Procedure Laterality Date  . CHOLECYSTECTOMY    . ESOPHAGOGASTRODUODENOSCOPY N/A 10/02/2018   Procedure: ESOPHAGOGASTRODUODENOSCOPY (EGD);  Surgeon: Toledo, Benay Pike, MD;  Location: [facility] ENDOSCOPY;  Service: Gastroenterology;  Laterality: N/A;  . INSERT / REPLACE / REMOVE PACEMAKER    . TOTAL ABDOMINAL HYSTERECTOMY W/ BILATERAL SALPINGOOPHORECTOMY      Prior to Admission medications   Medication Sig Start Date End Date Taking? Authorizing Provider  acetaminophen (TYLENOL) 325 MG tablet Take 650 mg by mouth 3 (three) times daily as needed for mild pain, fever or headache.     [provider]  albuterol (PROVENTIL) (2.5 MG/3ML) 0.083% nebulizer solution Take 2.5 mg by nebulization every 6 (six) hours as needed for wheezing or shortness of breath.    [provider]  amLODipine (NORVASC) 10 MG tablet TAKE ONE TABLET BY MOUTH ONCE DAILY *NEEDS APPOINTMENT FOR FURTHER REFILLS* Patient taking differently: Take 10 mg by mouth daily.  02/23/14   Jackolyn Confer, MD  aspirin 81 MG tablet Take 81 mg by mouth daily.    [provider]  atorvastatin (LIPITOR) 20 MG tablet Take 20 mg by mouth every evening.     [provider]  carvedilol (COREG) 25 MG tablet Take 1 tablet by mouth 2 (two) times daily with a meal.  11/30/16   [provider]  cholecalciferol (VITAMIN D) 1000 units tablet Take 1,000 Units by mouth daily.    [provider]  cloNIDine (CATAPRES) 0.3 MG tablet Take 0.3 mg by mouth 2 (two) times daily.  [provider]  ELIQUIS 5 MG TABS tablet Take 5 mg by mouth 2 (two) times daily. 09/12/18   [provider]  gabapentin (NEURONTIN) 100 MG capsule Take 100 mg by mouth 3 (three) times daily.     [provider]  insulin aspart (NOVOLOG) 100 UNIT/ML injection Inject 3-15 Units into the skin 3 (three) times daily before meals. Sliding scale Patient not taking: Reported on 10/02/2018 10/29/12    Jackolyn Confer, MD  Insulin Glargine (TOUJEO SOLOSTAR) 300 UNIT/ML SOPN Inject 30 Units into the skin daily. Patient taking differently: Inject 60 Units into the skin daily.  01/11/17   Hillary Bow, MD  levothyroxine (SYNTHROID, LEVOTHROID) 175 MCG tablet Take 1 tablet by mouth daily. Given one hour prior to any other medications 12/18/16   [provider]  losartan (COZAAR) 50 MG tablet Take 1 tablet by mouth daily. 02/26/17   [provider]  metoCLOPramide (REGLAN) 5 MG tablet Take 1 tablet (5 mg total) by mouth 4 (four) times daily -  before meals and at bedtime. 10/03/18 10/03/19  Dustin Flock, MD  pantoprazole (PROTONIX) 40 MG tablet Take 1 tablet (40 mg total) by mouth 2 (two) times daily. 10/03/18   Dustin Flock, MD  PARoxetine (PAXIL) 40 MG tablet Take 40 mg by mouth daily.     [provider]  traZODone (DESYREL) 50 MG tablet Take 50 mg by mouth at bedtime as needed for sleep.    [provider]  vitamin B-12 (CYANOCOBALAMIN) 1000 MCG tablet Take 5,000 mcg by mouth daily.     [provider]    Allergies Watermelon concentrate, Ace inhibitors, Celebrex [celecoxib], Penicillins, and Sulfa antibiotics  Family History  Problem Relation Age of Onset  . COPD Sister   . Cancer Brother   . Emphysema Mother   . Heart disease Father     Social History Social History   Tobacco Use  . Smoking status: Former Research scientist (life sciences)  . Smokeless tobacco: Never Used  . Tobacco comment: Quit 1985  Substance Use Topics  . Alcohol use: No  . Drug use: No    Review of Systems Constitutional: Negative for fever. ENT: Negative for sore throat. Respiratory: Negative for shortness of breath. Gastrointestinal: No abdominal pain.  No nausea, positive for "spitting up".  No diarrhea.  Genitourinary: Positive for dysuria. Musculoskeletal: Negative for generalized body aches. Skin: Negative for rash/lesion/wound. Neurological: Negative for headaches, focal  weakness or numbness.  ____________________________________________   PHYSICAL EXAM:  VITAL SIGNS:  Today's Vitals   05/05/19 1607 05/05/19 1608 05/05/19 1609  BP:  133/63   Pulse: 61    Resp: 18    Temp: 97.6 F (36.4 C)    TempSrc: Oral    SpO2: 95%    Weight:   86.2 kg  Height:   5\' 1"  (1.549 m)  PainSc:   6    Body mass index is 35.9 kg/m.   Constitutional: Alert and oriented. No acute distress. Head: Atraumatic. Nose: No congestion/rhinnorhea. Mouth/Throat: Mucous membranes are moist. Neck: No stridor.  Cardiovascular: Good peripheral circulation. Respiratory: Normal respiratory effort. Musculoskeletal: No restriction Neurologic:  Normal speech and language. No gross focal neurologic deficits are appreciated. Speech is normal. No gait instability. Skin:  Skin is warm, dry and intact. No rash noted. Psychiatric: Mood and affect are normal. Speech and behavior are normal.  ____________________________________________   LABS (all labs ordered are listed, but only abnormal results are displayed)  Labs Reviewed - No data to  display ____________________________________________  EKG  ____________________________________________   INITIAL CLINICAL IMPRESSION(S)   Acute cystitis; esophageal stricture.  Niece with patient. They were advised to await ER room assignment for further evaluation and treatment.    Victorino Dike, FNP 05/05/19 1744    Nance Pear, MD 05/05/19 2004

## 2019-05-08 LAB — URINE CULTURE: Culture: >=100000 — AB

## 2019-05-28 DIAGNOSIS — Z45018 Encounter for adjustment and management of other part of cardiac pacemaker: Secondary | ICD-10-CM | POA: Diagnosis not present

## 2019-05-28 DIAGNOSIS — Z66 Do not resuscitate: Secondary | ICD-10-CM | POA: Diagnosis not present

## 2019-05-28 DIAGNOSIS — I441 Atrioventricular block, second degree: Secondary | ICD-10-CM | POA: Diagnosis not present

## 2019-06-09 DIAGNOSIS — Z794 Long term (current) use of insulin: Secondary | ICD-10-CM | POA: Diagnosis not present

## 2019-06-09 DIAGNOSIS — I10 Essential (primary) hypertension: Secondary | ICD-10-CM | POA: Diagnosis not present

## 2019-06-09 DIAGNOSIS — E039 Hypothyroidism, unspecified: Secondary | ICD-10-CM | POA: Diagnosis not present

## 2019-06-09 DIAGNOSIS — F0390 Unspecified dementia without behavioral disturbance: Secondary | ICD-10-CM | POA: Diagnosis not present

## 2019-06-09 DIAGNOSIS — I5032 Chronic diastolic (congestive) heart failure: Secondary | ICD-10-CM | POA: Diagnosis not present

## 2019-06-09 DIAGNOSIS — R32 Unspecified urinary incontinence: Secondary | ICD-10-CM | POA: Diagnosis not present

## 2019-06-09 DIAGNOSIS — E113413 Type 2 diabetes mellitus with severe nonproliferative diabetic retinopathy with macular edema, bilateral: Secondary | ICD-10-CM | POA: Diagnosis not present

## 2019-06-09 DIAGNOSIS — R112 Nausea with vomiting, unspecified: Secondary | ICD-10-CM | POA: Diagnosis not present

## 2019-06-09 DIAGNOSIS — I480 Paroxysmal atrial fibrillation: Secondary | ICD-10-CM | POA: Diagnosis not present

## 2019-06-09 DIAGNOSIS — N184 Chronic kidney disease, stage 4 (severe): Secondary | ICD-10-CM | POA: Diagnosis not present

## 2019-06-20 DIAGNOSIS — R319 Hematuria, unspecified: Secondary | ICD-10-CM | POA: Diagnosis not present

## 2019-06-20 DIAGNOSIS — R300 Dysuria: Secondary | ICD-10-CM | POA: Diagnosis not present

## 2019-06-20 DIAGNOSIS — N390 Urinary tract infection, site not specified: Secondary | ICD-10-CM | POA: Diagnosis not present

## 2019-06-30 DIAGNOSIS — E1140 Type 2 diabetes mellitus with diabetic neuropathy, unspecified: Secondary | ICD-10-CM | POA: Diagnosis not present

## 2019-06-30 DIAGNOSIS — R1900 Intra-abdominal and pelvic swelling, mass and lump, unspecified site: Secondary | ICD-10-CM | POA: Diagnosis not present

## 2019-06-30 DIAGNOSIS — F0390 Unspecified dementia without behavioral disturbance: Secondary | ICD-10-CM | POA: Diagnosis not present

## 2019-06-30 DIAGNOSIS — N189 Chronic kidney disease, unspecified: Secondary | ICD-10-CM | POA: Diagnosis not present

## 2019-06-30 DIAGNOSIS — I130 Hypertensive heart and chronic kidney disease with heart failure and stage 1 through stage 4 chronic kidney disease, or unspecified chronic kidney disease: Secondary | ICD-10-CM | POA: Diagnosis not present

## 2019-06-30 DIAGNOSIS — I5032 Chronic diastolic (congestive) heart failure: Secondary | ICD-10-CM | POA: Diagnosis not present

## 2019-06-30 DIAGNOSIS — E1122 Type 2 diabetes mellitus with diabetic chronic kidney disease: Secondary | ICD-10-CM | POA: Diagnosis not present

## 2019-06-30 DIAGNOSIS — Z794 Long term (current) use of insulin: Secondary | ICD-10-CM | POA: Diagnosis not present

## 2019-06-30 DIAGNOSIS — R0602 Shortness of breath: Secondary | ICD-10-CM | POA: Diagnosis not present

## 2019-06-30 DIAGNOSIS — R079 Chest pain, unspecified: Secondary | ICD-10-CM | POA: Diagnosis not present

## 2019-06-30 DIAGNOSIS — I509 Heart failure, unspecified: Secondary | ICD-10-CM | POA: Diagnosis not present

## 2019-06-30 DIAGNOSIS — I1310 Hypertensive heart and chronic kidney disease without heart failure, with stage 1 through stage 4 chronic kidney disease, or unspecified chronic kidney disease: Secondary | ICD-10-CM | POA: Diagnosis not present

## 2019-07-30 DIAGNOSIS — I5031 Acute diastolic (congestive) heart failure: Secondary | ICD-10-CM | POA: Diagnosis not present

## 2019-07-30 DIAGNOSIS — N189 Chronic kidney disease, unspecified: Secondary | ICD-10-CM | POA: Diagnosis not present

## 2019-07-30 DIAGNOSIS — I441 Atrioventricular block, second degree: Secondary | ICD-10-CM | POA: Diagnosis not present

## 2019-07-30 DIAGNOSIS — I5033 Acute on chronic diastolic (congestive) heart failure: Secondary | ICD-10-CM | POA: Diagnosis not present

## 2019-07-30 DIAGNOSIS — Z20822 Contact with and (suspected) exposure to covid-19: Secondary | ICD-10-CM | POA: Diagnosis not present

## 2019-07-30 DIAGNOSIS — I130 Hypertensive heart and chronic kidney disease with heart failure and stage 1 through stage 4 chronic kidney disease, or unspecified chronic kidney disease: Secondary | ICD-10-CM | POA: Diagnosis not present

## 2019-07-30 DIAGNOSIS — I2510 Atherosclerotic heart disease of native coronary artery without angina pectoris: Secondary | ICD-10-CM | POA: Diagnosis not present

## 2019-07-30 DIAGNOSIS — J449 Chronic obstructive pulmonary disease, unspecified: Secondary | ICD-10-CM | POA: Diagnosis not present

## 2019-07-30 DIAGNOSIS — I480 Paroxysmal atrial fibrillation: Secondary | ICD-10-CM | POA: Diagnosis not present

## 2019-07-30 DIAGNOSIS — D631 Anemia in chronic kidney disease: Secondary | ICD-10-CM | POA: Diagnosis not present

## 2019-07-30 DIAGNOSIS — J9610 Chronic respiratory failure, unspecified whether with hypoxia or hypercapnia: Secondary | ICD-10-CM | POA: Diagnosis not present

## 2019-07-30 DIAGNOSIS — I272 Pulmonary hypertension, unspecified: Secondary | ICD-10-CM | POA: Diagnosis not present

## 2019-07-30 DIAGNOSIS — E1140 Type 2 diabetes mellitus with diabetic neuropathy, unspecified: Secondary | ICD-10-CM | POA: Diagnosis not present

## 2019-07-30 DIAGNOSIS — R0600 Dyspnea, unspecified: Secondary | ICD-10-CM | POA: Diagnosis not present

## 2019-07-30 DIAGNOSIS — J439 Emphysema, unspecified: Secondary | ICD-10-CM | POA: Diagnosis not present

## 2019-07-30 DIAGNOSIS — I509 Heart failure, unspecified: Secondary | ICD-10-CM | POA: Diagnosis not present

## 2019-07-30 DIAGNOSIS — Z66 Do not resuscitate: Secondary | ICD-10-CM | POA: Diagnosis not present

## 2019-07-30 DIAGNOSIS — D649 Anemia, unspecified: Secondary | ICD-10-CM | POA: Diagnosis not present

## 2019-07-30 DIAGNOSIS — E1122 Type 2 diabetes mellitus with diabetic chronic kidney disease: Secondary | ICD-10-CM | POA: Diagnosis not present

## 2019-08-15 DIAGNOSIS — I509 Heart failure, unspecified: Secondary | ICD-10-CM | POA: Diagnosis not present

## 2019-08-15 DIAGNOSIS — R200 Anesthesia of skin: Secondary | ICD-10-CM | POA: Diagnosis not present

## 2019-08-15 DIAGNOSIS — M25571 Pain in right ankle and joints of right foot: Secondary | ICD-10-CM | POA: Diagnosis not present

## 2019-08-15 DIAGNOSIS — E1140 Type 2 diabetes mellitus with diabetic neuropathy, unspecified: Secondary | ICD-10-CM | POA: Diagnosis not present

## 2019-08-15 DIAGNOSIS — M79671 Pain in right foot: Secondary | ICD-10-CM | POA: Diagnosis not present

## 2019-08-15 DIAGNOSIS — S9031XA Contusion of right foot, initial encounter: Secondary | ICD-10-CM | POA: Diagnosis not present

## 2019-08-15 DIAGNOSIS — M19071 Primary osteoarthritis, right ankle and foot: Secondary | ICD-10-CM | POA: Diagnosis not present

## 2019-08-15 DIAGNOSIS — I130 Hypertensive heart and chronic kidney disease with heart failure and stage 1 through stage 4 chronic kidney disease, or unspecified chronic kidney disease: Secondary | ICD-10-CM | POA: Diagnosis not present

## 2019-08-15 DIAGNOSIS — E1122 Type 2 diabetes mellitus with diabetic chronic kidney disease: Secondary | ICD-10-CM | POA: Diagnosis not present

## 2019-08-15 DIAGNOSIS — N189 Chronic kidney disease, unspecified: Secondary | ICD-10-CM | POA: Diagnosis not present

## 2019-08-27 DIAGNOSIS — Z45018 Encounter for adjustment and management of other part of cardiac pacemaker: Secondary | ICD-10-CM | POA: Diagnosis not present

## 2019-09-30 DIAGNOSIS — Z794 Long term (current) use of insulin: Secondary | ICD-10-CM | POA: Diagnosis not present

## 2019-09-30 DIAGNOSIS — Z1211 Encounter for screening for malignant neoplasm of colon: Secondary | ICD-10-CM | POA: Diagnosis not present

## 2019-09-30 DIAGNOSIS — F0390 Unspecified dementia without behavioral disturbance: Secondary | ICD-10-CM | POA: Diagnosis not present

## 2019-09-30 DIAGNOSIS — E113413 Type 2 diabetes mellitus with severe nonproliferative diabetic retinopathy with macular edema, bilateral: Secondary | ICD-10-CM | POA: Diagnosis not present

## 2019-09-30 DIAGNOSIS — F0150 Vascular dementia without behavioral disturbance: Secondary | ICD-10-CM | POA: Diagnosis not present

## 2019-09-30 DIAGNOSIS — I480 Paroxysmal atrial fibrillation: Secondary | ICD-10-CM | POA: Diagnosis not present

## 2019-09-30 DIAGNOSIS — N184 Chronic kidney disease, stage 4 (severe): Secondary | ICD-10-CM | POA: Diagnosis not present

## 2019-11-17 DIAGNOSIS — N3001 Acute cystitis with hematuria: Secondary | ICD-10-CM | POA: Diagnosis not present

## 2019-11-17 DIAGNOSIS — R350 Frequency of micturition: Secondary | ICD-10-CM | POA: Diagnosis not present

## 2019-11-26 DIAGNOSIS — Z45018 Encounter for adjustment and management of other part of cardiac pacemaker: Secondary | ICD-10-CM | POA: Diagnosis not present

## 2020-02-25 DIAGNOSIS — Z45018 Encounter for adjustment and management of other part of cardiac pacemaker: Secondary | ICD-10-CM | POA: Diagnosis not present

## 2020-05-26 DIAGNOSIS — Z45018 Encounter for adjustment and management of other part of cardiac pacemaker: Secondary | ICD-10-CM | POA: Diagnosis not present

## 2020-06-04 DIAGNOSIS — R0602 Shortness of breath: Secondary | ICD-10-CM | POA: Diagnosis not present

## 2020-06-04 DIAGNOSIS — Z20822 Contact with and (suspected) exposure to covid-19: Secondary | ICD-10-CM | POA: Diagnosis not present

## 2020-06-04 DIAGNOSIS — R600 Localized edema: Secondary | ICD-10-CM | POA: Diagnosis not present

## 2020-06-04 DIAGNOSIS — I130 Hypertensive heart and chronic kidney disease with heart failure and stage 1 through stage 4 chronic kidney disease, or unspecified chronic kidney disease: Secondary | ICD-10-CM | POA: Diagnosis not present

## 2020-06-04 DIAGNOSIS — J449 Chronic obstructive pulmonary disease, unspecified: Secondary | ICD-10-CM | POA: Diagnosis not present

## 2020-06-04 DIAGNOSIS — R0600 Dyspnea, unspecified: Secondary | ICD-10-CM | POA: Diagnosis not present

## 2020-06-04 DIAGNOSIS — R635 Abnormal weight gain: Secondary | ICD-10-CM | POA: Diagnosis not present

## 2020-06-04 DIAGNOSIS — E1122 Type 2 diabetes mellitus with diabetic chronic kidney disease: Secondary | ICD-10-CM | POA: Diagnosis not present

## 2020-06-04 DIAGNOSIS — E039 Hypothyroidism, unspecified: Secondary | ICD-10-CM | POA: Diagnosis not present

## 2020-06-04 DIAGNOSIS — F0390 Unspecified dementia without behavioral disturbance: Secondary | ICD-10-CM | POA: Diagnosis not present

## 2020-06-04 DIAGNOSIS — I5042 Chronic combined systolic (congestive) and diastolic (congestive) heart failure: Secondary | ICD-10-CM | POA: Diagnosis not present

## 2020-06-16 DIAGNOSIS — E039 Hypothyroidism, unspecified: Secondary | ICD-10-CM | POA: Diagnosis not present

## 2020-06-16 DIAGNOSIS — I5023 Acute on chronic systolic (congestive) heart failure: Secondary | ICD-10-CM | POA: Diagnosis not present

## 2020-06-16 DIAGNOSIS — E113413 Type 2 diabetes mellitus with severe nonproliferative diabetic retinopathy with macular edema, bilateral: Secondary | ICD-10-CM | POA: Diagnosis not present

## 2020-06-16 DIAGNOSIS — N184 Chronic kidney disease, stage 4 (severe): Secondary | ICD-10-CM | POA: Diagnosis not present

## 2020-06-16 DIAGNOSIS — Z794 Long term (current) use of insulin: Secondary | ICD-10-CM | POA: Diagnosis not present

## 2020-06-16 DIAGNOSIS — I10 Essential (primary) hypertension: Secondary | ICD-10-CM | POA: Diagnosis not present

## 2020-06-16 DIAGNOSIS — I480 Paroxysmal atrial fibrillation: Secondary | ICD-10-CM | POA: Diagnosis not present

## 2020-06-16 DIAGNOSIS — F0390 Unspecified dementia without behavioral disturbance: Secondary | ICD-10-CM | POA: Diagnosis not present

## 2020-06-23 DIAGNOSIS — I071 Rheumatic tricuspid insufficiency: Secondary | ICD-10-CM | POA: Diagnosis not present

## 2020-06-23 DIAGNOSIS — Z20822 Contact with and (suspected) exposure to covid-19: Secondary | ICD-10-CM | POA: Diagnosis not present

## 2020-06-23 DIAGNOSIS — I5020 Unspecified systolic (congestive) heart failure: Secondary | ICD-10-CM | POA: Diagnosis not present

## 2020-06-23 DIAGNOSIS — I10 Essential (primary) hypertension: Secondary | ICD-10-CM | POA: Diagnosis not present

## 2020-06-23 DIAGNOSIS — N189 Chronic kidney disease, unspecified: Secondary | ICD-10-CM | POA: Diagnosis not present

## 2020-06-23 DIAGNOSIS — I428 Other cardiomyopathies: Secondary | ICD-10-CM | POA: Diagnosis not present

## 2020-06-23 DIAGNOSIS — F0390 Unspecified dementia without behavioral disturbance: Secondary | ICD-10-CM | POA: Diagnosis not present

## 2020-06-23 DIAGNOSIS — E1149 Type 2 diabetes mellitus with other diabetic neurological complication: Secondary | ICD-10-CM | POA: Diagnosis not present

## 2020-06-23 DIAGNOSIS — I517 Cardiomegaly: Secondary | ICD-10-CM | POA: Diagnosis not present

## 2020-06-23 DIAGNOSIS — I442 Atrioventricular block, complete: Secondary | ICD-10-CM | POA: Diagnosis not present

## 2020-06-23 DIAGNOSIS — I361 Nonrheumatic tricuspid (valve) insufficiency: Secondary | ICD-10-CM | POA: Diagnosis not present

## 2020-06-23 DIAGNOSIS — E039 Hypothyroidism, unspecified: Secondary | ICD-10-CM | POA: Diagnosis not present

## 2020-06-23 DIAGNOSIS — Z9981 Dependence on supplemental oxygen: Secondary | ICD-10-CM | POA: Diagnosis not present

## 2020-06-23 DIAGNOSIS — N179 Acute kidney failure, unspecified: Secondary | ICD-10-CM | POA: Diagnosis not present

## 2020-06-23 DIAGNOSIS — R0989 Other specified symptoms and signs involving the circulatory and respiratory systems: Secondary | ICD-10-CM | POA: Diagnosis not present

## 2020-06-23 DIAGNOSIS — I130 Hypertensive heart and chronic kidney disease with heart failure and stage 1 through stage 4 chronic kidney disease, or unspecified chronic kidney disease: Secondary | ICD-10-CM | POA: Diagnosis not present

## 2020-06-23 DIAGNOSIS — J9601 Acute respiratory failure with hypoxia: Secondary | ICD-10-CM | POA: Diagnosis not present

## 2020-06-23 DIAGNOSIS — R609 Edema, unspecified: Secondary | ICD-10-CM | POA: Diagnosis not present

## 2020-06-23 DIAGNOSIS — R0602 Shortness of breath: Secondary | ICD-10-CM | POA: Diagnosis not present

## 2020-06-23 DIAGNOSIS — I6782 Cerebral ischemia: Secondary | ICD-10-CM | POA: Diagnosis not present

## 2020-06-23 DIAGNOSIS — J9691 Respiratory failure, unspecified with hypoxia: Secondary | ICD-10-CM | POA: Diagnosis not present

## 2020-06-23 DIAGNOSIS — Z45010 Encounter for checking and testing of cardiac pacemaker pulse generator [battery]: Secondary | ICD-10-CM | POA: Diagnosis not present

## 2020-06-23 DIAGNOSIS — R918 Other nonspecific abnormal finding of lung field: Secondary | ICD-10-CM | POA: Diagnosis not present

## 2020-06-23 DIAGNOSIS — E11649 Type 2 diabetes mellitus with hypoglycemia without coma: Secondary | ICD-10-CM | POA: Diagnosis not present

## 2020-06-23 DIAGNOSIS — I5023 Acute on chronic systolic (congestive) heart failure: Secondary | ICD-10-CM | POA: Diagnosis not present

## 2020-06-23 DIAGNOSIS — K7689 Other specified diseases of liver: Secondary | ICD-10-CM | POA: Diagnosis not present

## 2020-06-23 DIAGNOSIS — R160 Hepatomegaly, not elsewhere classified: Secondary | ICD-10-CM | POA: Diagnosis not present

## 2020-06-23 DIAGNOSIS — I420 Dilated cardiomyopathy: Secondary | ICD-10-CM | POA: Diagnosis not present

## 2020-06-23 DIAGNOSIS — N184 Chronic kidney disease, stage 4 (severe): Secondary | ICD-10-CM | POA: Diagnosis not present

## 2020-06-23 DIAGNOSIS — R4182 Altered mental status, unspecified: Secondary | ICD-10-CM | POA: Diagnosis not present

## 2020-06-23 DIAGNOSIS — I4891 Unspecified atrial fibrillation: Secondary | ICD-10-CM | POA: Diagnosis not present

## 2020-06-23 DIAGNOSIS — I5030 Unspecified diastolic (congestive) heart failure: Secondary | ICD-10-CM | POA: Diagnosis not present

## 2020-06-23 DIAGNOSIS — I272 Pulmonary hypertension, unspecified: Secondary | ICD-10-CM | POA: Diagnosis not present

## 2020-06-23 DIAGNOSIS — Z950 Presence of cardiac pacemaker: Secondary | ICD-10-CM | POA: Diagnosis not present

## 2020-06-23 DIAGNOSIS — I5043 Acute on chronic combined systolic (congestive) and diastolic (congestive) heart failure: Secondary | ICD-10-CM | POA: Diagnosis not present

## 2020-06-23 DIAGNOSIS — G9340 Encephalopathy, unspecified: Secondary | ICD-10-CM | POA: Diagnosis not present

## 2020-06-23 DIAGNOSIS — R188 Other ascites: Secondary | ICD-10-CM | POA: Diagnosis not present

## 2020-06-23 DIAGNOSIS — E8770 Fluid overload, unspecified: Secondary | ICD-10-CM | POA: Diagnosis not present

## 2020-06-23 DIAGNOSIS — J9611 Chronic respiratory failure with hypoxia: Secondary | ICD-10-CM | POA: Diagnosis not present

## 2020-06-23 DIAGNOSIS — Z66 Do not resuscitate: Secondary | ICD-10-CM | POA: Diagnosis not present

## 2020-06-24 DIAGNOSIS — I6782 Cerebral ischemia: Secondary | ICD-10-CM | POA: Diagnosis not present

## 2020-06-24 DIAGNOSIS — K7689 Other specified diseases of liver: Secondary | ICD-10-CM | POA: Diagnosis not present

## 2020-06-24 DIAGNOSIS — G9340 Encephalopathy, unspecified: Secondary | ICD-10-CM | POA: Diagnosis not present

## 2020-06-24 DIAGNOSIS — I361 Nonrheumatic tricuspid (valve) insufficiency: Secondary | ICD-10-CM | POA: Diagnosis not present

## 2020-06-24 DIAGNOSIS — I517 Cardiomegaly: Secondary | ICD-10-CM | POA: Diagnosis not present

## 2020-06-24 DIAGNOSIS — R188 Other ascites: Secondary | ICD-10-CM | POA: Diagnosis not present

## 2020-07-08 DIAGNOSIS — E1122 Type 2 diabetes mellitus with diabetic chronic kidney disease: Secondary | ICD-10-CM | POA: Diagnosis not present

## 2020-07-08 DIAGNOSIS — N184 Chronic kidney disease, stage 4 (severe): Secondary | ICD-10-CM | POA: Diagnosis not present

## 2020-07-08 DIAGNOSIS — I2510 Atherosclerotic heart disease of native coronary artery without angina pectoris: Secondary | ICD-10-CM | POA: Diagnosis not present

## 2020-07-08 DIAGNOSIS — I480 Paroxysmal atrial fibrillation: Secondary | ICD-10-CM | POA: Diagnosis not present

## 2020-07-08 DIAGNOSIS — I441 Atrioventricular block, second degree: Secondary | ICD-10-CM | POA: Diagnosis not present

## 2020-07-08 DIAGNOSIS — I130 Hypertensive heart and chronic kidney disease with heart failure and stage 1 through stage 4 chronic kidney disease, or unspecified chronic kidney disease: Secondary | ICD-10-CM | POA: Diagnosis not present

## 2020-07-08 DIAGNOSIS — Z48812 Encounter for surgical aftercare following surgery on the circulatory system: Secondary | ICD-10-CM | POA: Diagnosis not present

## 2020-07-08 DIAGNOSIS — J439 Emphysema, unspecified: Secondary | ICD-10-CM | POA: Diagnosis not present

## 2020-07-08 DIAGNOSIS — I5030 Unspecified diastolic (congestive) heart failure: Secondary | ICD-10-CM | POA: Diagnosis not present

## 2020-07-12 DEATH — deceased
